# Patient Record
Sex: Female | Born: 1935 | Race: White | Hispanic: No | Smoking: Never smoker
Health system: Southern US, Community
[De-identification: ages and names within clinical notes are randomized; demographics above are authoritative.]

## PROBLEM LIST (undated history)

## (undated) DIAGNOSIS — I10 Essential (primary) hypertension: Secondary | ICD-10-CM

## (undated) DIAGNOSIS — J439 Emphysema, unspecified: Secondary | ICD-10-CM

## (undated) DIAGNOSIS — G629 Polyneuropathy, unspecified: Secondary | ICD-10-CM

## (undated) DIAGNOSIS — I739 Peripheral vascular disease, unspecified: Secondary | ICD-10-CM

## (undated) DIAGNOSIS — K219 Gastro-esophageal reflux disease without esophagitis: Secondary | ICD-10-CM

## (undated) DIAGNOSIS — E785 Hyperlipidemia, unspecified: Secondary | ICD-10-CM

## (undated) DIAGNOSIS — M810 Age-related osteoporosis without current pathological fracture: Secondary | ICD-10-CM

## (undated) DIAGNOSIS — I509 Heart failure, unspecified: Secondary | ICD-10-CM

## (undated) DIAGNOSIS — I2510 Atherosclerotic heart disease of native coronary artery without angina pectoris: Secondary | ICD-10-CM

## (undated) DIAGNOSIS — I639 Cerebral infarction, unspecified: Secondary | ICD-10-CM

## (undated) DIAGNOSIS — D649 Anemia, unspecified: Secondary | ICD-10-CM

## (undated) DIAGNOSIS — N289 Disorder of kidney and ureter, unspecified: Secondary | ICD-10-CM

## (undated) HISTORY — DX: Heart failure, unspecified: I50.9

## (undated) HISTORY — DX: Hyperlipidemia, unspecified: E78.5

## (undated) HISTORY — DX: Anemia, unspecified: D64.9

## (undated) HISTORY — PX: CATARACT EXTRACTION: SUR2

## (undated) HISTORY — DX: Polyneuropathy, unspecified: G62.9

## (undated) HISTORY — DX: Age-related osteoporosis without current pathological fracture: M81.0

## (undated) HISTORY — DX: Emphysema, unspecified: J43.9

---

## 1980-10-19 HISTORY — PX: ABDOMINAL HYSTERECTOMY: SHX81

## 1990-10-19 HISTORY — PX: CHOLECYSTECTOMY: SHX55

## 2003-10-20 HISTORY — PX: JOINT REPLACEMENT: SHX530

## 2003-11-29 ENCOUNTER — Other Ambulatory Visit: Payer: Self-pay

## 2004-07-19 ENCOUNTER — Ambulatory Visit: Payer: Self-pay

## 2004-08-19 ENCOUNTER — Ambulatory Visit: Payer: Self-pay

## 2004-09-18 ENCOUNTER — Ambulatory Visit: Payer: Self-pay | Admitting: Internal Medicine

## 2004-10-19 ENCOUNTER — Ambulatory Visit: Payer: Self-pay | Admitting: Internal Medicine

## 2004-11-19 ENCOUNTER — Ambulatory Visit: Payer: Self-pay | Admitting: Internal Medicine

## 2004-12-17 ENCOUNTER — Ambulatory Visit: Payer: Self-pay | Admitting: Internal Medicine

## 2005-01-17 ENCOUNTER — Ambulatory Visit: Payer: Self-pay | Admitting: Internal Medicine

## 2005-02-16 ENCOUNTER — Ambulatory Visit: Payer: Self-pay | Admitting: Internal Medicine

## 2005-03-19 ENCOUNTER — Ambulatory Visit: Payer: Self-pay | Admitting: Internal Medicine

## 2005-04-18 ENCOUNTER — Ambulatory Visit: Payer: Self-pay | Admitting: Internal Medicine

## 2005-05-19 ENCOUNTER — Ambulatory Visit: Payer: Self-pay | Admitting: Internal Medicine

## 2005-06-19 ENCOUNTER — Ambulatory Visit: Payer: Self-pay | Admitting: Internal Medicine

## 2005-08-07 ENCOUNTER — Ambulatory Visit: Payer: Self-pay | Admitting: Internal Medicine

## 2005-08-14 ENCOUNTER — Ambulatory Visit: Payer: Self-pay | Admitting: Internal Medicine

## 2005-08-19 ENCOUNTER — Ambulatory Visit: Payer: Self-pay | Admitting: Internal Medicine

## 2005-09-18 ENCOUNTER — Ambulatory Visit: Payer: Self-pay | Admitting: Internal Medicine

## 2005-10-19 ENCOUNTER — Ambulatory Visit: Payer: Self-pay | Admitting: Internal Medicine

## 2005-11-19 ENCOUNTER — Ambulatory Visit: Payer: Self-pay | Admitting: Internal Medicine

## 2005-12-25 ENCOUNTER — Ambulatory Visit: Payer: Self-pay | Admitting: Internal Medicine

## 2006-01-01 ENCOUNTER — Ambulatory Visit: Payer: Self-pay | Admitting: Internal Medicine

## 2006-01-17 ENCOUNTER — Ambulatory Visit: Payer: Self-pay | Admitting: Internal Medicine

## 2006-02-16 ENCOUNTER — Ambulatory Visit: Payer: Self-pay | Admitting: Internal Medicine

## 2006-03-19 ENCOUNTER — Ambulatory Visit: Payer: Self-pay | Admitting: Internal Medicine

## 2006-04-18 ENCOUNTER — Ambulatory Visit: Payer: Self-pay | Admitting: Internal Medicine

## 2006-05-19 ENCOUNTER — Ambulatory Visit: Payer: Self-pay | Admitting: Internal Medicine

## 2006-06-19 ENCOUNTER — Ambulatory Visit: Payer: Self-pay | Admitting: Internal Medicine

## 2006-10-08 ENCOUNTER — Emergency Department: Payer: Self-pay | Admitting: Emergency Medicine

## 2006-12-06 ENCOUNTER — Inpatient Hospital Stay: Payer: Self-pay | Admitting: Orthopaedic Surgery

## 2006-12-06 ENCOUNTER — Other Ambulatory Visit: Payer: Self-pay

## 2006-12-11 ENCOUNTER — Encounter: Payer: Self-pay | Admitting: Internal Medicine

## 2006-12-18 ENCOUNTER — Encounter: Payer: Self-pay | Admitting: Internal Medicine

## 2008-07-24 ENCOUNTER — Other Ambulatory Visit: Payer: Self-pay

## 2008-07-24 ENCOUNTER — Inpatient Hospital Stay: Payer: Self-pay | Admitting: Internal Medicine

## 2008-08-12 ENCOUNTER — Inpatient Hospital Stay: Payer: Self-pay | Admitting: Internal Medicine

## 2008-08-23 ENCOUNTER — Encounter: Payer: Self-pay | Admitting: Internal Medicine

## 2008-09-16 ENCOUNTER — Inpatient Hospital Stay: Payer: Self-pay | Admitting: Internal Medicine

## 2009-11-07 ENCOUNTER — Inpatient Hospital Stay: Payer: Self-pay | Admitting: Internal Medicine

## 2009-11-26 ENCOUNTER — Ambulatory Visit: Payer: Self-pay | Admitting: Internal Medicine

## 2009-12-03 ENCOUNTER — Encounter: Payer: Self-pay | Admitting: Internal Medicine

## 2010-09-18 ENCOUNTER — Ambulatory Visit: Payer: Self-pay | Admitting: Internal Medicine

## 2010-09-29 ENCOUNTER — Ambulatory Visit: Payer: Self-pay | Admitting: Ophthalmology

## 2010-11-26 ENCOUNTER — Ambulatory Visit: Payer: Self-pay | Admitting: Ophthalmology

## 2010-12-08 ENCOUNTER — Ambulatory Visit: Payer: Self-pay | Admitting: Ophthalmology

## 2011-01-27 ENCOUNTER — Inpatient Hospital Stay: Payer: Self-pay | Admitting: Internal Medicine

## 2011-10-08 ENCOUNTER — Ambulatory Visit: Payer: Self-pay | Admitting: Internal Medicine

## 2012-01-06 DIAGNOSIS — L905 Scar conditions and fibrosis of skin: Secondary | ICD-10-CM | POA: Diagnosis not present

## 2012-01-06 DIAGNOSIS — C44319 Basal cell carcinoma of skin of other parts of face: Secondary | ICD-10-CM | POA: Diagnosis not present

## 2012-01-06 DIAGNOSIS — D485 Neoplasm of uncertain behavior of skin: Secondary | ICD-10-CM | POA: Diagnosis not present

## 2012-02-05 DIAGNOSIS — M79609 Pain in unspecified limb: Secondary | ICD-10-CM | POA: Diagnosis not present

## 2012-02-05 DIAGNOSIS — M722 Plantar fascial fibromatosis: Secondary | ICD-10-CM | POA: Diagnosis not present

## 2012-02-05 DIAGNOSIS — B351 Tinea unguium: Secondary | ICD-10-CM | POA: Diagnosis not present

## 2012-04-29 DIAGNOSIS — L0291 Cutaneous abscess, unspecified: Secondary | ICD-10-CM | POA: Diagnosis not present

## 2012-04-29 DIAGNOSIS — R413 Other amnesia: Secondary | ICD-10-CM | POA: Diagnosis not present

## 2012-04-29 DIAGNOSIS — I10 Essential (primary) hypertension: Secondary | ICD-10-CM | POA: Diagnosis not present

## 2012-04-29 DIAGNOSIS — I8390 Asymptomatic varicose veins of unspecified lower extremity: Secondary | ICD-10-CM | POA: Diagnosis not present

## 2012-04-29 DIAGNOSIS — M539 Dorsopathy, unspecified: Secondary | ICD-10-CM | POA: Diagnosis not present

## 2012-04-29 DIAGNOSIS — L0390 Cellulitis, unspecified: Secondary | ICD-10-CM | POA: Diagnosis not present

## 2012-05-03 DIAGNOSIS — I8390 Asymptomatic varicose veins of unspecified lower extremity: Secondary | ICD-10-CM | POA: Diagnosis not present

## 2012-05-03 DIAGNOSIS — R413 Other amnesia: Secondary | ICD-10-CM | POA: Diagnosis not present

## 2012-05-03 DIAGNOSIS — I10 Essential (primary) hypertension: Secondary | ICD-10-CM | POA: Diagnosis not present

## 2012-05-03 DIAGNOSIS — M539 Dorsopathy, unspecified: Secondary | ICD-10-CM | POA: Diagnosis not present

## 2012-05-03 DIAGNOSIS — L0390 Cellulitis, unspecified: Secondary | ICD-10-CM | POA: Diagnosis not present

## 2012-05-03 DIAGNOSIS — L0291 Cutaneous abscess, unspecified: Secondary | ICD-10-CM | POA: Diagnosis not present

## 2012-06-13 DIAGNOSIS — R5381 Other malaise: Secondary | ICD-10-CM | POA: Diagnosis not present

## 2012-06-13 DIAGNOSIS — I739 Peripheral vascular disease, unspecified: Secondary | ICD-10-CM | POA: Diagnosis not present

## 2012-06-13 DIAGNOSIS — R413 Other amnesia: Secondary | ICD-10-CM | POA: Diagnosis not present

## 2012-06-13 DIAGNOSIS — R5383 Other fatigue: Secondary | ICD-10-CM | POA: Diagnosis not present

## 2012-07-06 DIAGNOSIS — R209 Unspecified disturbances of skin sensation: Secondary | ICD-10-CM | POA: Diagnosis not present

## 2012-07-06 DIAGNOSIS — I8310 Varicose veins of unspecified lower extremity with inflammation: Secondary | ICD-10-CM | POA: Diagnosis not present

## 2012-07-12 DIAGNOSIS — I872 Venous insufficiency (chronic) (peripheral): Secondary | ICD-10-CM | POA: Diagnosis not present

## 2012-07-13 ENCOUNTER — Emergency Department: Payer: Self-pay | Admitting: Emergency Medicine

## 2012-07-13 DIAGNOSIS — R059 Cough, unspecified: Secondary | ICD-10-CM | POA: Diagnosis not present

## 2012-07-13 DIAGNOSIS — J069 Acute upper respiratory infection, unspecified: Secondary | ICD-10-CM | POA: Diagnosis not present

## 2012-07-13 DIAGNOSIS — R0602 Shortness of breath: Secondary | ICD-10-CM | POA: Diagnosis not present

## 2012-07-13 LAB — URINALYSIS, COMPLETE
Bilirubin,UR: NEGATIVE
Blood: NEGATIVE
Glucose,UR: NEGATIVE mg/dL (ref 0–75)
Ketone: NEGATIVE
Nitrite: NEGATIVE
Ph: 7 (ref 4.5–8.0)
RBC,UR: <1 /HPF (ref 0–5)
Specific Gravity: 1.008 (ref 1.003–1.030)
WBC UR: NONE SEEN /HPF (ref 0–5)

## 2012-07-13 LAB — BASIC METABOLIC PANEL
Anion Gap: 10 (ref 7–16)
BUN: 36 mg/dL — ABNORMAL HIGH (ref 7–18)
Creatinine: 1.8 mg/dL — ABNORMAL HIGH (ref 0.60–1.30)
EGFR (African American): 29 — ABNORMAL LOW
EGFR (Non-African Amer.): 25 — ABNORMAL LOW
Glucose: 124 mg/dL — ABNORMAL HIGH (ref 65–99)
Osmolality: 287 (ref 275–301)
Potassium: 4.4 mmol/L (ref 3.5–5.1)

## 2012-07-13 LAB — CBC
HGB: 12.4 g/dL (ref 12.0–16.0)
MCH: 32.9 pg (ref 26.0–34.0)
MCHC: 33.3 g/dL (ref 32.0–36.0)
MCV: 99 fL (ref 80–100)
Platelet: 115 10*3/uL — ABNORMAL LOW (ref 150–440)
RDW: 13.3 % (ref 11.5–14.5)

## 2012-08-01 DIAGNOSIS — I872 Venous insufficiency (chronic) (peripheral): Secondary | ICD-10-CM | POA: Diagnosis not present

## 2012-08-04 ENCOUNTER — Emergency Department: Payer: Self-pay | Admitting: Emergency Medicine

## 2012-08-04 DIAGNOSIS — R6889 Other general symptoms and signs: Secondary | ICD-10-CM | POA: Diagnosis not present

## 2012-08-04 DIAGNOSIS — R079 Chest pain, unspecified: Secondary | ICD-10-CM | POA: Diagnosis not present

## 2012-08-04 DIAGNOSIS — I10 Essential (primary) hypertension: Secondary | ICD-10-CM | POA: Diagnosis not present

## 2012-08-04 DIAGNOSIS — I6350 Cerebral infarction due to unspecified occlusion or stenosis of unspecified cerebral artery: Secondary | ICD-10-CM | POA: Diagnosis not present

## 2012-08-04 DIAGNOSIS — S0003XA Contusion of scalp, initial encounter: Secondary | ICD-10-CM | POA: Diagnosis not present

## 2012-08-04 DIAGNOSIS — M25559 Pain in unspecified hip: Secondary | ICD-10-CM | POA: Diagnosis not present

## 2012-09-06 DIAGNOSIS — I872 Venous insufficiency (chronic) (peripheral): Secondary | ICD-10-CM | POA: Diagnosis not present

## 2012-10-17 DIAGNOSIS — L570 Actinic keratosis: Secondary | ICD-10-CM | POA: Diagnosis not present

## 2012-10-17 DIAGNOSIS — I8310 Varicose veins of unspecified lower extremity with inflammation: Secondary | ICD-10-CM | POA: Diagnosis not present

## 2013-01-27 DIAGNOSIS — M79609 Pain in unspecified limb: Secondary | ICD-10-CM | POA: Diagnosis not present

## 2013-01-27 DIAGNOSIS — B351 Tinea unguium: Secondary | ICD-10-CM | POA: Diagnosis not present

## 2013-02-23 ENCOUNTER — Emergency Department: Payer: Self-pay | Admitting: Emergency Medicine

## 2013-02-23 DIAGNOSIS — N390 Urinary tract infection, site not specified: Secondary | ICD-10-CM | POA: Diagnosis not present

## 2013-02-23 DIAGNOSIS — I4949 Other premature depolarization: Secondary | ICD-10-CM | POA: Diagnosis not present

## 2013-02-23 DIAGNOSIS — R002 Palpitations: Secondary | ICD-10-CM | POA: Diagnosis not present

## 2013-02-23 DIAGNOSIS — R6889 Other general symptoms and signs: Secondary | ICD-10-CM | POA: Diagnosis not present

## 2013-02-23 DIAGNOSIS — I491 Atrial premature depolarization: Secondary | ICD-10-CM | POA: Diagnosis not present

## 2013-02-23 DIAGNOSIS — R42 Dizziness and giddiness: Secondary | ICD-10-CM | POA: Diagnosis not present

## 2013-02-23 LAB — COMPREHENSIVE METABOLIC PANEL
Alkaline Phosphatase: 128 U/L (ref 50–136)
BUN: 34 mg/dL — ABNORMAL HIGH (ref 7–18)
Bilirubin,Total: 0.4 mg/dL (ref 0.2–1.0)
Calcium, Total: 9.9 mg/dL (ref 8.5–10.1)
Chloride: 105 mmol/L (ref 98–107)
Creatinine: 1.81 mg/dL — ABNORMAL HIGH (ref 0.60–1.30)
EGFR (African American): 28 — ABNORMAL LOW
EGFR (Non-African Amer.): 25 — ABNORMAL LOW
Osmolality: 288 (ref 275–301)
Potassium: 4.4 mmol/L (ref 3.5–5.1)
SGOT(AST): 23 U/L (ref 15–37)
SGPT (ALT): 21 U/L (ref 12–78)
Total Protein: 7.7 g/dL (ref 6.4–8.2)

## 2013-02-23 LAB — CBC
HCT: 38.7 % (ref 35.0–47.0)
MCH: 32.8 pg (ref 26.0–34.0)
MCV: 97 fL (ref 80–100)
RBC: 3.97 10*6/uL (ref 3.80–5.20)
WBC: 6.9 10*3/uL (ref 3.6–11.0)

## 2013-02-23 LAB — CK TOTAL AND CKMB (NOT AT ARMC): CK, Total: 34 U/L (ref 21–215)

## 2013-02-24 LAB — URINALYSIS, COMPLETE
Bacteria: NONE SEEN
Blood: NEGATIVE
Glucose,UR: NEGATIVE mg/dL (ref 0–75)
Ketone: NEGATIVE
Ph: 5 (ref 4.5–8.0)
Protein: NEGATIVE
RBC,UR: 2 /HPF (ref 0–5)
Specific Gravity: 1.009 (ref 1.003–1.030)
Squamous Epithelial: <1
WBC UR: 25 /HPF (ref 0–5)

## 2013-03-01 DIAGNOSIS — Z0189 Encounter for other specified special examinations: Secondary | ICD-10-CM | POA: Diagnosis not present

## 2013-03-01 DIAGNOSIS — D485 Neoplasm of uncertain behavior of skin: Secondary | ICD-10-CM | POA: Diagnosis not present

## 2013-03-01 DIAGNOSIS — Z85828 Personal history of other malignant neoplasm of skin: Secondary | ICD-10-CM | POA: Diagnosis not present

## 2013-03-01 DIAGNOSIS — I8310 Varicose veins of unspecified lower extremity with inflammation: Secondary | ICD-10-CM | POA: Diagnosis not present

## 2013-03-01 DIAGNOSIS — L570 Actinic keratosis: Secondary | ICD-10-CM | POA: Diagnosis not present

## 2013-03-01 DIAGNOSIS — C44621 Squamous cell carcinoma of skin of unspecified upper limb, including shoulder: Secondary | ICD-10-CM | POA: Diagnosis not present

## 2013-03-29 DIAGNOSIS — D2360 Other benign neoplasm of skin of unspecified upper limb, including shoulder: Secondary | ICD-10-CM | POA: Diagnosis not present

## 2013-03-29 DIAGNOSIS — L570 Actinic keratosis: Secondary | ICD-10-CM | POA: Diagnosis not present

## 2013-05-01 DIAGNOSIS — I10 Essential (primary) hypertension: Secondary | ICD-10-CM | POA: Diagnosis not present

## 2013-05-01 DIAGNOSIS — I509 Heart failure, unspecified: Secondary | ICD-10-CM | POA: Diagnosis not present

## 2013-05-01 DIAGNOSIS — M109 Gout, unspecified: Secondary | ICD-10-CM | POA: Diagnosis not present

## 2013-05-01 DIAGNOSIS — I8390 Asymptomatic varicose veins of unspecified lower extremity: Secondary | ICD-10-CM | POA: Diagnosis not present

## 2013-05-16 DIAGNOSIS — E7800 Pure hypercholesterolemia, unspecified: Secondary | ICD-10-CM | POA: Diagnosis not present

## 2013-05-16 DIAGNOSIS — E039 Hypothyroidism, unspecified: Secondary | ICD-10-CM | POA: Diagnosis not present

## 2013-05-16 DIAGNOSIS — D649 Anemia, unspecified: Secondary | ICD-10-CM | POA: Diagnosis not present

## 2013-06-13 DIAGNOSIS — I8390 Asymptomatic varicose veins of unspecified lower extremity: Secondary | ICD-10-CM | POA: Diagnosis not present

## 2013-06-13 DIAGNOSIS — M109 Gout, unspecified: Secondary | ICD-10-CM | POA: Diagnosis not present

## 2013-07-12 DIAGNOSIS — H43819 Vitreous degeneration, unspecified eye: Secondary | ICD-10-CM | POA: Diagnosis not present

## 2013-09-04 DIAGNOSIS — IMO0002 Reserved for concepts with insufficient information to code with codable children: Secondary | ICD-10-CM | POA: Diagnosis not present

## 2013-09-04 DIAGNOSIS — I739 Peripheral vascular disease, unspecified: Secondary | ICD-10-CM | POA: Diagnosis not present

## 2013-09-04 DIAGNOSIS — A5211 Tabes dorsalis: Secondary | ICD-10-CM | POA: Diagnosis not present

## 2013-09-05 DIAGNOSIS — R609 Edema, unspecified: Secondary | ICD-10-CM | POA: Diagnosis not present

## 2013-09-05 DIAGNOSIS — I509 Heart failure, unspecified: Secondary | ICD-10-CM | POA: Diagnosis not present

## 2013-09-05 DIAGNOSIS — I10 Essential (primary) hypertension: Secondary | ICD-10-CM | POA: Diagnosis not present

## 2013-09-05 DIAGNOSIS — R5381 Other malaise: Secondary | ICD-10-CM | POA: Diagnosis not present

## 2013-09-12 ENCOUNTER — Encounter: Payer: Self-pay | Admitting: *Deleted

## 2013-09-21 DIAGNOSIS — I739 Peripheral vascular disease, unspecified: Secondary | ICD-10-CM | POA: Diagnosis not present

## 2013-09-21 DIAGNOSIS — I509 Heart failure, unspecified: Secondary | ICD-10-CM | POA: Diagnosis not present

## 2013-09-21 DIAGNOSIS — E539 Vitamin B deficiency, unspecified: Secondary | ICD-10-CM | POA: Diagnosis not present

## 2013-09-21 DIAGNOSIS — A5211 Tabes dorsalis: Secondary | ICD-10-CM | POA: Diagnosis not present

## 2013-10-03 ENCOUNTER — Ambulatory Visit (INDEPENDENT_AMBULATORY_CARE_PROVIDER_SITE_OTHER): Payer: Medicare Other | Admitting: General Surgery

## 2013-10-03 ENCOUNTER — Encounter: Payer: Self-pay | Admitting: General Surgery

## 2013-10-03 VITALS — BP 148/70 | HR 72 | Resp 14 | Ht 66.0 in | Wt 162.0 lb

## 2013-10-03 DIAGNOSIS — I872 Venous insufficiency (chronic) (peripheral): Secondary | ICD-10-CM | POA: Diagnosis not present

## 2013-10-03 DIAGNOSIS — I739 Peripheral vascular disease, unspecified: Secondary | ICD-10-CM

## 2013-10-03 NOTE — Patient Instructions (Signed)
The patient is aware to call back for any questions or concerns.  

## 2013-10-03 NOTE — Progress Notes (Signed)
Patient ID: [name] [name], female   DOB: 29-Mar-1923, 79 y.o.   MRN: [id number]  Chief Complaint  Patient presents with  . Obesity    poor circulation in both legs    HPI [name] [name] is a 79 y.o. female here today for a evaluation on both legs. Patient states she has poor circulation.She states she has been having this problem for many years. Daughter states that her mother complains of feet pain that seems to be getting worse. Pain in toes and heels especially at night. Dr Juel Burrow did increase her gabapentin. This has lessened her foot pain. She has been wearing compression wraps for about one year. She has known venous insufficiency with mild peripheral artery disease. Only change is increased foot pain when lying in bed and discoloration. This involves both feet, left is worse. She has done well with use of circaid compression device.Marland Kitchen    HPI  Past Medical History  Diagnosis Date  . Hyperlipidemia   . CHF (congestive heart failure)   . Emphysema of lung   . Neuropathy   . Anemia   . Osteoporosis     Past Surgical History  Procedure Laterality Date  . Cholecystectomy  1992  . Abdominal hysterectomy  1982  . Joint replacement  2005    Family History  Problem Relation Age of Onset  . Colon cancer Sister     Social History History  Substance Use Topics  . Smoking status: Never Smoker   . Smokeless tobacco: Never Used  . Alcohol Use: No    Allergies  Allergen Reactions  . Morphine And Related     Heart problems   . Zoloft [Sertraline Hcl] Swelling    Current Outpatient Prescriptions  Medication Sig Dispense Refill  . ALPRAZolam (XANAX) 0.25 MG tablet Take 1 tablet by mouth daily.      Marland Kitchen aspirin 81 MG tablet Take 81 mg by mouth daily.      . calcium carbonate 200 MG capsule Take 250 mg by mouth 2 (two) times daily with a meal.      . cholecalciferol (VITAMIN D) 400 UNITS TABS tablet Take 400 Units by mouth.      . clopidogrel (PLAVIX) 75 MG tablet Take 1 tablet by  mouth daily.      . cyanocobalamin 1000 MCG tablet Take 100 mcg by mouth daily.      Marland Kitchen FLECTOR 1.3 % PTCH 1 patch daily.      . furosemide (LASIX) 20 MG tablet Take 1 tablet by mouth daily.      Marland Kitchen gabapentin (NEURONTIN) 300 MG capsule Take 1 capsule by mouth daily.      Marland Kitchen loratadine (CLARITIN) 10 MG tablet Take 10 mg by mouth daily.      . magnesium 30 MG tablet Take 30 mg by mouth 2 (two) times daily.      . metoprolol tartrate (LOPRESSOR) 25 MG tablet Take 1 tablet by mouth daily.      . Multiple Vitamins-Minerals (MULTIVITAMIN WITH MINERALS) tablet Take 1 tablet by mouth daily.      . nitroGLYCERIN (NITRODUR - DOSED IN MG/24 HR) 0.2 mg/hr patch 1 patch daily.      . potassium chloride SA (K-DUR,KLOR-CON) 20 MEQ tablet Take 1 tablet by mouth daily.      . ranitidine (ZANTAC) 150 MG capsule Take 150 mg by mouth 2 (two) times daily.      Marland Kitchen triamcinolone (KENALOG) 0.025 % cream 1 application daily.      Marland Kitchen  iron polysaccharides (NIFEREX) 150 MG capsule Take 150 mg by mouth daily.       No current facility-administered medications for this visit.    Review of Systems Review of Systems  Constitutional: Negative.   Respiratory: Negative.   Cardiovascular: Positive for leg swelling. Negative for chest pain and palpitations.    Blood pressure 148/70, pulse 72, resp. rate 14, height 5\' 6"  (1.676 m), weight 162 lb (73.483 kg).  Physical Exam Physical Exam  Constitutional: She is oriented to person, place, and time. She appears well-developed and well-nourished.  Eyes: Conjunctivae are normal. No scleral icterus.  Neck: Neck supple.  Cardiovascular:  Pulses:      Dorsalis pedis pulses are 1+ on the right side, and 0 on the left side.       Posterior tibial pulses are 0 on the right side, and 0 on the left side.  No palpable pulse in left foot. Bilateral minimal edema. Both feet have brisk capillary refill.  Neurological: She is alert and oriented to person, place, and time.  Skin: Skin is  warm and dry.    Data Reviewed none  Assessment    Venous insufficiency is stable. She has moderate PAD on left but does not account for her pain in both feet.     Plan    Consider refer to vascular for further arterial evaluation if her foot pain worsens. At present her pain is better with increasing her gabapentin dosage. Will discuss with Dr. Juel Burrow.        SANKAR,SEEPLAPUTHUR G 10/05/2013, 9:51 AM

## 2013-10-05 ENCOUNTER — Encounter: Payer: Self-pay | Admitting: General Surgery

## 2013-10-05 DIAGNOSIS — I739 Peripheral vascular disease, unspecified: Secondary | ICD-10-CM | POA: Insufficient documentation

## 2013-10-05 DIAGNOSIS — I872 Venous insufficiency (chronic) (peripheral): Secondary | ICD-10-CM | POA: Insufficient documentation

## 2014-01-15 DIAGNOSIS — I739 Peripheral vascular disease, unspecified: Secondary | ICD-10-CM | POA: Diagnosis not present

## 2014-01-15 DIAGNOSIS — E539 Vitamin B deficiency, unspecified: Secondary | ICD-10-CM | POA: Diagnosis not present

## 2014-01-15 DIAGNOSIS — I509 Heart failure, unspecified: Secondary | ICD-10-CM | POA: Diagnosis not present

## 2014-01-15 DIAGNOSIS — I10 Essential (primary) hypertension: Secondary | ICD-10-CM | POA: Diagnosis not present

## 2014-01-15 DIAGNOSIS — A5211 Tabes dorsalis: Secondary | ICD-10-CM | POA: Diagnosis not present

## 2014-01-26 DIAGNOSIS — I70229 Atherosclerosis of native arteries of extremities with rest pain, unspecified extremity: Secondary | ICD-10-CM | POA: Diagnosis not present

## 2014-01-26 DIAGNOSIS — I10 Essential (primary) hypertension: Secondary | ICD-10-CM | POA: Diagnosis not present

## 2014-01-26 DIAGNOSIS — E785 Hyperlipidemia, unspecified: Secondary | ICD-10-CM | POA: Diagnosis not present

## 2014-06-07 ENCOUNTER — Emergency Department: Payer: Self-pay | Admitting: Emergency Medicine

## 2014-06-07 DIAGNOSIS — R609 Edema, unspecified: Secondary | ICD-10-CM | POA: Diagnosis not present

## 2014-06-07 DIAGNOSIS — J984 Other disorders of lung: Secondary | ICD-10-CM | POA: Diagnosis not present

## 2014-06-07 DIAGNOSIS — J441 Chronic obstructive pulmonary disease with (acute) exacerbation: Secondary | ICD-10-CM | POA: Diagnosis not present

## 2014-06-07 DIAGNOSIS — R0602 Shortness of breath: Secondary | ICD-10-CM | POA: Diagnosis not present

## 2014-06-07 DIAGNOSIS — I10 Essential (primary) hypertension: Secondary | ICD-10-CM | POA: Diagnosis not present

## 2014-06-07 DIAGNOSIS — R059 Cough, unspecified: Secondary | ICD-10-CM | POA: Diagnosis not present

## 2014-06-07 DIAGNOSIS — M7989 Other specified soft tissue disorders: Secondary | ICD-10-CM | POA: Diagnosis not present

## 2014-06-07 DIAGNOSIS — I517 Cardiomegaly: Secondary | ICD-10-CM | POA: Diagnosis not present

## 2014-06-07 DIAGNOSIS — R05 Cough: Secondary | ICD-10-CM | POA: Diagnosis not present

## 2014-06-07 LAB — BASIC METABOLIC PANEL
Anion Gap: 8 (ref 7–16)
BUN: 38 mg/dL — ABNORMAL HIGH (ref 7–18)
CALCIUM: 9.8 mg/dL (ref 8.5–10.1)
CHLORIDE: 103 mmol/L (ref 98–107)
CO2: 28 mmol/L (ref 21–32)
Creatinine: 1.89 mg/dL — ABNORMAL HIGH (ref 0.60–1.30)
EGFR (African American): 27 — ABNORMAL LOW
GFR CALC NON AF AMER: 23 — AB
Glucose: 191 mg/dL — ABNORMAL HIGH (ref 65–99)
OSMOLALITY: 292 (ref 275–301)
POTASSIUM: 5 mmol/L (ref 3.5–5.1)
Sodium: 139 mmol/L (ref 136–145)

## 2014-06-07 LAB — CBC
HCT: 40.6 % (ref 35.0–47.0)
HGB: 13.6 g/dL (ref 12.0–16.0)
MCH: 33.2 pg (ref 26.0–34.0)
MCHC: 33.5 g/dL (ref 32.0–36.0)
MCV: 99 fL (ref 80–100)
Platelet: 141 10*3/uL — ABNORMAL LOW (ref 150–440)
RBC: 4.1 10*6/uL (ref 3.80–5.20)
RDW: 13.2 % (ref 11.5–14.5)
WBC: 13 10*3/uL — ABNORMAL HIGH (ref 3.6–11.0)

## 2014-06-15 ENCOUNTER — Encounter: Payer: Self-pay | Admitting: Podiatry

## 2014-06-15 ENCOUNTER — Ambulatory Visit (INDEPENDENT_AMBULATORY_CARE_PROVIDER_SITE_OTHER): Payer: Medicare Other | Admitting: Podiatry

## 2014-06-15 VITALS — Ht 66.0 in | Wt 158.0 lb

## 2014-06-15 DIAGNOSIS — M79673 Pain in unspecified foot: Secondary | ICD-10-CM

## 2014-06-15 DIAGNOSIS — R5383 Other fatigue: Secondary | ICD-10-CM | POA: Diagnosis not present

## 2014-06-15 DIAGNOSIS — R413 Other amnesia: Secondary | ICD-10-CM | POA: Diagnosis not present

## 2014-06-15 DIAGNOSIS — J159 Unspecified bacterial pneumonia: Secondary | ICD-10-CM | POA: Diagnosis not present

## 2014-06-15 DIAGNOSIS — M79609 Pain in unspecified limb: Secondary | ICD-10-CM

## 2014-06-15 DIAGNOSIS — B351 Tinea unguium: Secondary | ICD-10-CM

## 2014-06-15 DIAGNOSIS — R5381 Other malaise: Secondary | ICD-10-CM | POA: Diagnosis not present

## 2014-06-15 DIAGNOSIS — I739 Peripheral vascular disease, unspecified: Secondary | ICD-10-CM | POA: Diagnosis not present

## 2014-06-15 NOTE — Progress Notes (Signed)
Subjective:     Patient ID: [name] [name], female   DOB: 01-04-1923, 80 y.o.   MRN: [id number]  HPI patient presents with caregiver with yellow thick nailbeds 1-5 both feet the become painful and small little irritations of the fourth and fifth toes on both feet that they keep under control with Neosporin stating that they are seen the vascular doctor at this time   Review of Systems     Objective:   Physical Exam No change in neurovascular status with thick yellow brittle nailbeds 1-5 both feet that are painful and small lesions on both toes which are nontender when pressed    Assessment:     Mycotic nail infection with pain and patient with poor circulatory status    Plan:     Continue with Dr. for circulation and debrided nailbeds 1-5 both feet with no iatrogenic bleeding noted

## 2014-07-03 DIAGNOSIS — D649 Anemia, unspecified: Secondary | ICD-10-CM | POA: Diagnosis not present

## 2014-07-03 DIAGNOSIS — J189 Pneumonia, unspecified organism: Secondary | ICD-10-CM | POA: Diagnosis not present

## 2014-08-20 ENCOUNTER — Encounter: Payer: Self-pay | Admitting: Podiatry

## 2014-09-07 ENCOUNTER — Other Ambulatory Visit: Payer: Medicare Other

## 2014-12-10 DIAGNOSIS — R413 Other amnesia: Secondary | ICD-10-CM | POA: Diagnosis not present

## 2014-12-10 DIAGNOSIS — I10 Essential (primary) hypertension: Secondary | ICD-10-CM | POA: Diagnosis not present

## 2014-12-10 DIAGNOSIS — I8390 Asymptomatic varicose veins of unspecified lower extremity: Secondary | ICD-10-CM | POA: Diagnosis not present

## 2014-12-10 DIAGNOSIS — I509 Heart failure, unspecified: Secondary | ICD-10-CM | POA: Diagnosis not present

## 2015-03-11 DIAGNOSIS — I509 Heart failure, unspecified: Secondary | ICD-10-CM | POA: Diagnosis not present

## 2015-03-13 DIAGNOSIS — I10 Essential (primary) hypertension: Secondary | ICD-10-CM | POA: Diagnosis not present

## 2015-03-13 DIAGNOSIS — E784 Other hyperlipidemia: Secondary | ICD-10-CM | POA: Diagnosis not present

## 2015-03-25 DIAGNOSIS — K210 Gastro-esophageal reflux disease with esophagitis: Secondary | ICD-10-CM | POA: Diagnosis not present

## 2015-03-25 DIAGNOSIS — I10 Essential (primary) hypertension: Secondary | ICD-10-CM | POA: Diagnosis not present

## 2015-03-25 DIAGNOSIS — J690 Pneumonitis due to inhalation of food and vomit: Secondary | ICD-10-CM | POA: Diagnosis not present

## 2015-03-25 DIAGNOSIS — I739 Peripheral vascular disease, unspecified: Secondary | ICD-10-CM | POA: Diagnosis not present

## 2015-04-01 DIAGNOSIS — I8390 Asymptomatic varicose veins of unspecified lower extremity: Secondary | ICD-10-CM | POA: Diagnosis not present

## 2015-04-01 DIAGNOSIS — J180 Bronchopneumonia, unspecified organism: Secondary | ICD-10-CM | POA: Diagnosis not present

## 2015-04-01 DIAGNOSIS — I509 Heart failure, unspecified: Secondary | ICD-10-CM | POA: Diagnosis not present

## 2015-04-01 DIAGNOSIS — R05 Cough: Secondary | ICD-10-CM | POA: Diagnosis not present

## 2015-05-02 DIAGNOSIS — I110 Hypertensive heart disease with heart failure: Secondary | ICD-10-CM | POA: Diagnosis not present

## 2015-05-02 DIAGNOSIS — M109 Gout, unspecified: Secondary | ICD-10-CM | POA: Diagnosis not present

## 2015-05-02 DIAGNOSIS — M1460 Charcot's joint, unspecified site: Secondary | ICD-10-CM | POA: Diagnosis not present

## 2015-05-02 DIAGNOSIS — K210 Gastro-esophageal reflux disease with esophagitis: Secondary | ICD-10-CM | POA: Diagnosis not present

## 2015-07-11 DIAGNOSIS — F411 Generalized anxiety disorder: Secondary | ICD-10-CM | POA: Diagnosis not present

## 2015-07-11 DIAGNOSIS — I110 Hypertensive heart disease with heart failure: Secondary | ICD-10-CM | POA: Diagnosis not present

## 2015-07-11 DIAGNOSIS — M1460 Charcot's joint, unspecified site: Secondary | ICD-10-CM | POA: Diagnosis not present

## 2015-07-11 DIAGNOSIS — M109 Gout, unspecified: Secondary | ICD-10-CM | POA: Diagnosis not present

## 2015-07-15 ENCOUNTER — Telehealth: Payer: Self-pay | Admitting: Podiatry

## 2015-07-15 NOTE — Telephone Encounter (Signed)
Left voice mail to schedule appt

## 2015-09-17 DIAGNOSIS — M76891 Other specified enthesopathies of right lower limb, excluding foot: Secondary | ICD-10-CM | POA: Diagnosis not present

## 2015-09-17 DIAGNOSIS — M2550 Pain in unspecified joint: Secondary | ICD-10-CM | POA: Diagnosis not present

## 2015-09-17 DIAGNOSIS — M1460 Charcot's joint, unspecified site: Secondary | ICD-10-CM | POA: Diagnosis not present

## 2015-09-17 DIAGNOSIS — M109 Gout, unspecified: Secondary | ICD-10-CM | POA: Diagnosis not present

## 2015-11-25 DIAGNOSIS — I130 Hypertensive heart and chronic kidney disease with heart failure and stage 1 through stage 4 chronic kidney disease, or unspecified chronic kidney disease: Secondary | ICD-10-CM | POA: Diagnosis not present

## 2015-11-25 DIAGNOSIS — M109 Gout, unspecified: Secondary | ICD-10-CM | POA: Diagnosis not present

## 2015-11-25 DIAGNOSIS — I10 Essential (primary) hypertension: Secondary | ICD-10-CM | POA: Diagnosis not present

## 2015-11-25 DIAGNOSIS — D649 Anemia, unspecified: Secondary | ICD-10-CM | POA: Diagnosis not present

## 2015-11-25 DIAGNOSIS — J449 Chronic obstructive pulmonary disease, unspecified: Secondary | ICD-10-CM | POA: Diagnosis not present

## 2015-11-25 DIAGNOSIS — I110 Hypertensive heart disease with heart failure: Secondary | ICD-10-CM | POA: Diagnosis not present

## 2015-11-25 DIAGNOSIS — M1460 Charcot's joint, unspecified site: Secondary | ICD-10-CM | POA: Diagnosis not present

## 2015-11-25 DIAGNOSIS — K219 Gastro-esophageal reflux disease without esophagitis: Secondary | ICD-10-CM | POA: Diagnosis not present

## 2015-11-25 DIAGNOSIS — E785 Hyperlipidemia, unspecified: Secondary | ICD-10-CM | POA: Diagnosis not present

## 2015-11-28 ENCOUNTER — Emergency Department: Payer: Medicare Other

## 2015-11-28 ENCOUNTER — Emergency Department
Admission: EM | Admit: 2015-11-28 | Discharge: 2015-11-28 | Disposition: A | Discharge status: Home / Self Care | Payer: Medicare Other | Attending: Emergency Medicine | Admitting: Emergency Medicine

## 2015-11-28 ENCOUNTER — Encounter: Payer: Self-pay | Admitting: *Deleted

## 2015-11-28 DIAGNOSIS — Z7952 Long term (current) use of systemic steroids: Secondary | ICD-10-CM | POA: Diagnosis not present

## 2015-11-28 DIAGNOSIS — R1031 Right lower quadrant pain: Secondary | ICD-10-CM | POA: Insufficient documentation

## 2015-11-28 DIAGNOSIS — Z7902 Long term (current) use of antithrombotics/antiplatelets: Secondary | ICD-10-CM | POA: Insufficient documentation

## 2015-11-28 DIAGNOSIS — Z7982 Long term (current) use of aspirin: Secondary | ICD-10-CM | POA: Insufficient documentation

## 2015-11-28 DIAGNOSIS — Z79899 Other long term (current) drug therapy: Secondary | ICD-10-CM | POA: Insufficient documentation

## 2015-11-28 DIAGNOSIS — R112 Nausea with vomiting, unspecified: Secondary | ICD-10-CM | POA: Insufficient documentation

## 2015-11-28 DIAGNOSIS — R109 Unspecified abdominal pain: Secondary | ICD-10-CM

## 2015-11-28 DIAGNOSIS — R1010 Upper abdominal pain, unspecified: Secondary | ICD-10-CM | POA: Diagnosis not present

## 2015-11-28 LAB — COMPREHENSIVE METABOLIC PANEL
ALBUMIN: 4 g/dL (ref 3.5–5.0)
ALK PHOS: 96 U/L (ref 38–126)
ALT: 17 U/L (ref 14–54)
ANION GAP: 9 (ref 5–15)
AST: 24 U/L (ref 15–41)
BUN: 40 mg/dL — ABNORMAL HIGH (ref 6–20)
CALCIUM: 10.1 mg/dL (ref 8.9–10.3)
CO2: 28 mmol/L (ref 22–32)
Chloride: 101 mmol/L (ref 101–111)
Creatinine, Ser: 1.68 mg/dL — ABNORMAL HIGH (ref 0.44–1.00)
GFR calc Af Amer: 30 mL/min — ABNORMAL LOW (ref >60)
GFR calc non Af Amer: 26 mL/min — ABNORMAL LOW (ref >60)
GLUCOSE: 140 mg/dL — AB (ref 65–99)
Potassium: 5.2 mmol/L — ABNORMAL HIGH (ref 3.5–5.1)
SODIUM: 138 mmol/L (ref 135–145)
Total Bilirubin: 0.6 mg/dL (ref 0.3–1.2)
Total Protein: 7.1 g/dL (ref 6.5–8.1)

## 2015-11-28 LAB — CBC
HEMATOCRIT: 36.8 % (ref 35.0–47.0)
HEMOGLOBIN: 12.4 g/dL (ref 12.0–16.0)
MCH: 33.4 pg (ref 26.0–34.0)
MCHC: 33.7 g/dL (ref 32.0–36.0)
MCV: 99.1 fL (ref 80.0–100.0)
Platelets: 158 10*3/uL (ref 150–440)
RBC: 3.71 MIL/uL — ABNORMAL LOW (ref 3.80–5.20)
RDW: 13.5 % (ref 11.5–14.5)
WBC: 9.2 10*3/uL (ref 3.6–11.0)

## 2015-11-28 LAB — URINALYSIS COMPLETE WITH MICROSCOPIC (ARMC ONLY)
Bilirubin Urine: NEGATIVE
Glucose, UA: NEGATIVE mg/dL
Hgb urine dipstick: NEGATIVE
Leukocytes, UA: NEGATIVE
Nitrite: NEGATIVE
PROTEIN: NEGATIVE mg/dL
RBC / HPF: NONE SEEN RBC/hpf (ref 0–5)
Specific Gravity, Urine: 1.012 (ref 1.005–1.030)
pH: 7 (ref 5.0–8.0)

## 2015-11-28 LAB — LIPASE, BLOOD: Lipase: 27 U/L (ref 11–51)

## 2015-11-28 MED ORDER — GI COCKTAIL ~~LOC~~
30.0000 mL | Freq: Once | ORAL | Status: AC
  Administered 2015-11-28: 30 mL via ORAL
  Filled 2015-11-28: qty 30

## 2015-11-28 MED ORDER — IOHEXOL 240 MG/ML SOLN
25.0000 mL | Freq: Once | INTRAMUSCULAR | Status: AC | PRN
  Administered 2015-11-28: 25 mL via ORAL

## 2015-11-28 MED ORDER — DICYCLOMINE HCL 20 MG PO TABS: 20.0000 mg | ORAL_TABLET | Freq: Three times a day (TID) | ORAL | Status: AC | PRN

## 2015-11-28 MED ORDER — ONDANSETRON HCL 4 MG PO TABS: 4.0000 mg | ORAL_TABLET | Freq: Three times a day (TID) | ORAL | Status: AC | PRN

## 2015-11-28 MED ORDER — ONDANSETRON HCL 4 MG/2ML IJ SOLN
4.0000 mg | Freq: Once | INTRAMUSCULAR | Status: DC
  Filled 2015-11-28: qty 2

## 2015-11-28 MED ORDER — ONDANSETRON HCL 4 MG/2ML IJ SOLN
4.0000 mg | Freq: Once | INTRAMUSCULAR | Status: AC | PRN
  Administered 2015-11-28: 4 mg via INTRAVENOUS
  Filled 2015-11-28: qty 2

## 2015-11-28 MED ORDER — KETOROLAC TROMETHAMINE 30 MG/ML IJ SOLN
15.0000 mg | Freq: Once | INTRAMUSCULAR | Status: AC
  Administered 2015-11-28: 15 mg via INTRAVENOUS
  Filled 2015-11-28: qty 1

## 2015-11-28 NOTE — ED Notes (Signed)
Pt reports nausea has resolved. Pt currently refusing nausea medication. Pt agreed to call nurse if nausea comes back.

## 2015-11-28 NOTE — ED Notes (Signed)
States she woke up this AM with mild upper abd pain but states as the day has gone on increased upper abd pain and now vomiting

## 2015-11-28 NOTE — Discharge Instructions (Signed)
Abdominal Pain, Adult Many things can cause abdominal pain. Usually, abdominal pain is not caused by a disease and will improve without treatment. It can often be observed and treated at home. Your health care provider will do a physical exam and possibly order blood tests and X-rays to help determine the seriousness of your pain. However, in many cases, more time must pass before a clear cause of the pain can be found. Before that point, your health care provider may not know if you need more testing or further treatment. HOME CARE INSTRUCTIONS Monitor your abdominal pain for any changes. The following actions may help to alleviate any discomfort you are experiencing:  Only take over-the-counter or prescription medicines as directed by your health care provider.  Do not take laxatives unless directed to do so by your health care provider.  Try a clear liquid diet (broth, tea, or water) as directed by your health care provider. Slowly move to a bland diet as tolerated. SEEK MEDICAL CARE IF:  You have unexplained abdominal pain.  You have abdominal pain associated with nausea or diarrhea.  You have pain when you urinate or have a bowel movement.  You experience abdominal pain that wakes you in the night.  You have abdominal pain that is worsened or improved by eating food.  You have abdominal pain that is worsened with eating fatty foods.  You have a fever. SEEK IMMEDIATE MEDICAL CARE IF:  Your pain does not go away within 2 hours.  You keep throwing up (vomiting).  Your pain is felt only in portions of the abdomen, such as the right side or the left lower portion of the abdomen.  You pass bloody or black tarry stools. MAKE SURE YOU:  Understand these instructions.  Will watch your condition.  Will get help right away if you are not doing well or get worse.   This information is not intended to replace advice given to you by your health care provider. Make sure you discuss  any questions you have with your health care provider.   Document Released: 07/15/2005 Document Revised: 06/26/2015 Document Reviewed: 06/14/2013 Elsevier Interactive Patient Education [employer].  Please return immediately if condition worsens. Please contact her primary physician or the physician you were given for referral. If you have any specialist physicians involved in her treatment and plan please also contact them. Thank you for using [facility]. Please return especially for uncontrolled pain, bloody stool, fever, persistent vomiting, or any other new concerns.

## 2015-11-28 NOTE — ED Provider Notes (Signed)
Time Seen: Approximately ----------------------------------------- 6:11 PM on 11/28/2015 -----------------------------------------    I have reviewed the triage notes  Chief Complaint: Abdominal Pain and Emesis   History of Present Illness: [name] [name] is a 82 y.o. female who states that she woke up this morning with lower middle abdominal pain. She states she feels like she has a lot of gas and was unable to pass the gas. She's had 2 small bowel movements at home with some mild relief of the discomfort. She does not state that lateralizes to the left or the right lower abdominal area. She's had some occasional nausea and vomited 1 in the waiting room. She denies any feelings of nausea now after receiving IV Zofran. She denies any dysuria, hematuria, urinary frequency.   Past Medical History  Diagnosis Date  . Hyperlipidemia   . CHF (congestive heart failure) ([place])   . Emphysema of lung ([place])   . Neuropathy ([place])   . Anemia   . Osteoporosis     Patient Active Problem List   Diagnosis Date Noted  . PAD (peripheral artery disease) ([place]) 10/05/2013  . Chronic venous insufficiency 10/05/2013    Past Surgical History  Procedure Laterality Date  . Cholecystectomy  1992  . Abdominal hysterectomy  1982  . Joint replacement  2005   patient states she had the appendix out when they did her abdominal hysterectomy  Past Surgical History  Procedure Laterality Date  . Cholecystectomy  1992  . Abdominal hysterectomy  1982  . Joint replacement  2005    Current Outpatient Rx  Name  Route  Sig  Dispense  Refill  . ALPRAZolam (XANAX) 0.25 MG tablet   Oral   Take 1 tablet by mouth daily.         Marland Kitchen aspirin 81 MG tablet   Oral   Take 81 mg by mouth daily.         . calcium carbonate 200 MG capsule   Oral   Take 250 mg by mouth 2 (two) times daily with a meal.         . cholecalciferol (VITAMIN D) 400 UNITS TABS tablet   Oral   Take 400 Units by mouth.         .  clopidogrel (PLAVIX) 75 MG tablet   Oral   Take 1 tablet by mouth daily.         . cyanocobalamin 1000 MCG tablet   Oral   Take 100 mcg by mouth daily.         Marland Kitchen FLECTOR 1.3 % PTCH      1 patch daily.         . furosemide (LASIX) 20 MG tablet   Oral   Take 1 tablet by mouth daily.         Marland Kitchen gabapentin (NEURONTIN) 300 MG capsule   Oral   Take 1 capsule by mouth daily.         . iron polysaccharides (NIFEREX) 150 MG capsule   Oral   Take 150 mg by mouth daily.         Marland Kitchen loratadine (CLARITIN) 10 MG tablet   Oral   Take 10 mg by mouth daily.         . magnesium 30 MG tablet   Oral   Take 30 mg by mouth 2 (two) times daily.         . metoprolol tartrate (LOPRESSOR) 25 MG tablet   Oral   Take 1  tablet by mouth daily.         . Multiple Vitamins-Minerals (MULTIVITAMIN WITH MINERALS) tablet   Oral   Take 1 tablet by mouth daily.         . nitroGLYCERIN (NITRODUR - DOSED IN MG/24 HR) 0.2 mg/hr patch      1 patch daily.         . potassium chloride SA (K-DUR,KLOR-CON) 20 MEQ tablet   Oral   Take 1 tablet by mouth daily.         . ranitidine (ZANTAC) 150 MG capsule   Oral   Take 150 mg by mouth 2 (two) times daily.         Marland Kitchen triamcinolone (KENALOG) 0.025 % cream      1 application daily.           Allergies:  Morphine and related and Zoloft  Family History: Family History  Problem Relation Age of Onset  . Colon cancer Sister     Social History: Social History  Substance Use Topics  . Smoking status: Never Smoker   . Smokeless tobacco: Never Used  . Alcohol Use: No     Review of Systems:   10 point review of systems was performed and was otherwise negative:  Constitutional: No fever Eyes: No visual disturbances ENT: No sore throat, ear pain Cardiac: No chest pain Respiratory: No shortness of breath, wheezing, or stridor Abdomen: Lower middle abdominal pain with previous nausea and vomited 1. She denies any loose  stool or diarrhea. She denies any melena or hematochezia Endocrine: No weight loss, No night sweats Extremities: No peripheral edema, cyanosis Skin: No rashes, easy bruising Neurologic: No focal weakness, trouble with speech or swollowing Urologic: No dysuria, Hematuria, or urinary frequency   Physical Exam:  ED Triage Vitals  Enc Vitals Group     BP 11/28/15 1740 191/68 mmHg     Pulse Rate 11/28/15 1740 82     Resp 11/28/15 1740 18     Temp 11/28/15 1740 97.9 F (36.6 C)     Temp Source 11/28/15 1740 Oral     SpO2 11/28/15 1740 100 %     Weight 11/28/15 1740 142 lb (64.411 kg)     Height 11/28/15 1740 5\' 6"  (1.676 m)     Head Cir --      Peak Flow --      Pain Score 11/28/15 1740 10     Pain Loc --      Pain Edu? --      Excl. in [place]? --     General: Awake , Alert , and Oriented times 3; GCS 15 Head: Normal cephalic , atraumatic Eyes: Pupils equal , round, reactive to light Nose/Throat: No nasal drainage, patent upper airway without erythema or exudate.  Neck: Supple, Full range of motion, No anterior adenopathy or palpable thyroid masses Lungs: Clear to ascultation without wheezes , rhonchi, or rales Heart: Regular rate, regular rhythm without murmurs , gallops , or rubs Abdomen: Mild tenderness to deep palpation in lower middle quadrant without rebound, guarding , or rigidity; bowel sounds positive and symmetric in all 4 quadrants. No organomegaly .        Extremities: 2 plus symmetric pulses. No edema, clubbing or cyanosis Neurologic: normal ambulation, Motor symmetric without deficits, sensory intact Skin: warm, dry, no rashes   Labs:   All laboratory work was reviewed including any pertinent negatives or positives listed below:  Labs Reviewed  CBC - Abnormal; Notable for  the following:    RBC 3.71 (*)    All other components within normal limits  LIPASE, BLOOD  COMPREHENSIVE METABOLIC PANEL  URINALYSIS COMPLETEWITH MICROSCOPIC ([place])   review of  laboratory work showed no significant findings or changes from the patient's baseline labs   Radiology:   Narrative:    CLINICAL DATA: Patient awoke this morning with in mild upper abdominal pain, which has increased throughout the day. Pain in both upper quadrants. Patient now reports vomiting.  EXAM: CT ABDOMEN AND PELVIS WITHOUT CONTRAST  TECHNIQUE: Multidetector CT imaging of the abdomen and pelvis was performed following the standard protocol without IV contrast.  COMPARISON: 07/24/2008  FINDINGS: Lung bases: Reticulation and cystic changes at the lung bases are similar to the prior CT. Heart is top-normal in size.  Liver and spleen: Unremarkable.  Gallbladder surgically absent. There is chronic dilation of common bile duct to 9 mm.  Pancreas: Unremarkable.  Adrenal glands: No masses.  Kidneys, ureters, bladder: Bilateral renal scarring and more diffuse cortical thinning. Small low-density lesion in the upper pole the right kidney is likely a cyst. No hydronephrosis. Ureters normal course and caliber. Bladder is unremarkable.  Uterus and adnexa: Uterus surgically absent. No pelvic masses.  Lymph nodes: No adenopathy.  Ascites: None.  Vascular: Atherosclerotic calcifications along a normal caliber abdominal aorta and iliac arteries.  Gastrointestinal: There is dilated small bowel. The most dilated loop is in the pelvis where it measures 4.6 cm in diameter. This has a relatively abrupt transition to normal caliber small bowel in the right pelvis. There is mesenteric inflammation most prominent in the right mid abdomen. No colonic dilation. The left colon is decompressed. There are few sigmoid diverticula. No diverticulitis. No appendix is visualized.  Musculoskeletal: Left hip prosthesis appears well seated and aligned. Bones are demineralized. There are degenerative changes of the visualized spine. No osteoblastic or osteolytic  lesions.  IMPRESSION: 1. Small bowel dilation with an apparent transition point in the right pelvis with. This is consistent with a partial small bowel obstruction. There is associated mesenteric inflammation/edema. No bowel wall thickening is seen to suggest bowel inflammation or ischemia. 2. No other acute findings.       I personally reviewed the radiologic studies   ED Course:  Differential diagnosis includes but is not exclusive to o, acute appendicitis, urinary tract infection, , bowel obstruction, colitis, renal colic, gastroenteritis, ischemic bowel disease, aortic dissection ,etc.  Patient was given IV fluids, as well as Zofran for nausea, low-dose Toradol for pain. Patient overall had symptomatic relief of her abdominal pain while here in emergency department. She had an extensive evaluation which did not show any acute inflammatory changes suggestive of a surgical abdomen on her CAT scan. Repeat exam of her abdomen showed essentially resolution of all lower middle quadrant abdominal discomfort. I advised the family was with her along with the patient that if she has any increase in pain along with a fever or bloody stool that they should return to the emergency department. She was unlikely to be ischemic bowel disease.    Assessment:  Acute unspecified abdominal pain      Plan:  Outpatient management Patient was advised to return immediately if condition worsens. Patient was advised to follow up with their primary care physician or other specialized physicians involved in their outpatient care             Daymon Larsen, MD 11/28/15 2302

## 2015-12-02 DIAGNOSIS — R1083 Colic: Secondary | ICD-10-CM | POA: Diagnosis not present

## 2015-12-02 DIAGNOSIS — M109 Gout, unspecified: Secondary | ICD-10-CM | POA: Diagnosis not present

## 2015-12-02 DIAGNOSIS — I10 Essential (primary) hypertension: Secondary | ICD-10-CM | POA: Diagnosis not present

## 2015-12-02 DIAGNOSIS — R109 Unspecified abdominal pain: Secondary | ICD-10-CM | POA: Diagnosis not present

## 2016-01-24 DIAGNOSIS — I8390 Asymptomatic varicose veins of unspecified lower extremity: Secondary | ICD-10-CM | POA: Diagnosis not present

## 2016-01-24 DIAGNOSIS — I509 Heart failure, unspecified: Secondary | ICD-10-CM | POA: Diagnosis not present

## 2016-01-24 DIAGNOSIS — K210 Gastro-esophageal reflux disease with esophagitis: Secondary | ICD-10-CM | POA: Diagnosis not present

## 2016-01-24 DIAGNOSIS — F411 Generalized anxiety disorder: Secondary | ICD-10-CM | POA: Diagnosis not present

## 2016-05-01 DIAGNOSIS — I509 Heart failure, unspecified: Secondary | ICD-10-CM | POA: Diagnosis not present

## 2016-05-01 DIAGNOSIS — I87329 Chronic venous hypertension (idiopathic) with inflammation of unspecified lower extremity: Secondary | ICD-10-CM | POA: Diagnosis not present

## 2016-05-01 DIAGNOSIS — I8390 Asymptomatic varicose veins of unspecified lower extremity: Secondary | ICD-10-CM | POA: Diagnosis not present

## 2016-05-01 DIAGNOSIS — L0291 Cutaneous abscess, unspecified: Secondary | ICD-10-CM | POA: Diagnosis not present

## 2016-05-04 DIAGNOSIS — Z7982 Long term (current) use of aspirin: Secondary | ICD-10-CM | POA: Diagnosis not present

## 2016-05-04 DIAGNOSIS — L97812 Non-pressure chronic ulcer of other part of right lower leg with fat layer exposed: Secondary | ICD-10-CM | POA: Diagnosis not present

## 2016-05-04 DIAGNOSIS — L97522 Non-pressure chronic ulcer of other part of left foot with fat layer exposed: Secondary | ICD-10-CM | POA: Diagnosis not present

## 2016-05-04 DIAGNOSIS — F0390 Unspecified dementia without behavioral disturbance: Secondary | ICD-10-CM | POA: Diagnosis not present

## 2016-05-04 DIAGNOSIS — I87333 Chronic venous hypertension (idiopathic) with ulcer and inflammation of bilateral lower extremity: Secondary | ICD-10-CM | POA: Diagnosis not present

## 2016-05-04 DIAGNOSIS — I130 Hypertensive heart and chronic kidney disease with heart failure and stage 1 through stage 4 chronic kidney disease, or unspecified chronic kidney disease: Secondary | ICD-10-CM | POA: Diagnosis not present

## 2016-05-04 DIAGNOSIS — D510 Vitamin B12 deficiency anemia due to intrinsic factor deficiency: Secondary | ICD-10-CM | POA: Diagnosis not present

## 2016-05-04 DIAGNOSIS — I509 Heart failure, unspecified: Secondary | ICD-10-CM | POA: Diagnosis not present

## 2016-05-04 DIAGNOSIS — Z7901 Long term (current) use of anticoagulants: Secondary | ICD-10-CM | POA: Diagnosis not present

## 2016-05-04 DIAGNOSIS — F411 Generalized anxiety disorder: Secondary | ICD-10-CM | POA: Diagnosis not present

## 2016-05-04 DIAGNOSIS — E1122 Type 2 diabetes mellitus with diabetic chronic kidney disease: Secondary | ICD-10-CM | POA: Diagnosis not present

## 2016-05-04 DIAGNOSIS — N189 Chronic kidney disease, unspecified: Secondary | ICD-10-CM | POA: Diagnosis not present

## 2016-05-04 DIAGNOSIS — M109 Gout, unspecified: Secondary | ICD-10-CM | POA: Diagnosis not present

## 2016-05-04 DIAGNOSIS — Z9181 History of falling: Secondary | ICD-10-CM | POA: Diagnosis not present

## 2016-05-06 DIAGNOSIS — I130 Hypertensive heart and chronic kidney disease with heart failure and stage 1 through stage 4 chronic kidney disease, or unspecified chronic kidney disease: Secondary | ICD-10-CM | POA: Diagnosis not present

## 2016-05-06 DIAGNOSIS — N189 Chronic kidney disease, unspecified: Secondary | ICD-10-CM | POA: Diagnosis not present

## 2016-05-06 DIAGNOSIS — I509 Heart failure, unspecified: Secondary | ICD-10-CM | POA: Diagnosis not present

## 2016-05-06 DIAGNOSIS — L97812 Non-pressure chronic ulcer of other part of right lower leg with fat layer exposed: Secondary | ICD-10-CM | POA: Diagnosis not present

## 2016-05-06 DIAGNOSIS — L97522 Non-pressure chronic ulcer of other part of left foot with fat layer exposed: Secondary | ICD-10-CM | POA: Diagnosis not present

## 2016-05-06 DIAGNOSIS — I87333 Chronic venous hypertension (idiopathic) with ulcer and inflammation of bilateral lower extremity: Secondary | ICD-10-CM | POA: Diagnosis not present

## 2016-05-08 DIAGNOSIS — L97522 Non-pressure chronic ulcer of other part of left foot with fat layer exposed: Secondary | ICD-10-CM | POA: Diagnosis not present

## 2016-05-08 DIAGNOSIS — N189 Chronic kidney disease, unspecified: Secondary | ICD-10-CM | POA: Diagnosis not present

## 2016-05-08 DIAGNOSIS — I130 Hypertensive heart and chronic kidney disease with heart failure and stage 1 through stage 4 chronic kidney disease, or unspecified chronic kidney disease: Secondary | ICD-10-CM | POA: Diagnosis not present

## 2016-05-08 DIAGNOSIS — I87333 Chronic venous hypertension (idiopathic) with ulcer and inflammation of bilateral lower extremity: Secondary | ICD-10-CM | POA: Diagnosis not present

## 2016-05-08 DIAGNOSIS — L97812 Non-pressure chronic ulcer of other part of right lower leg with fat layer exposed: Secondary | ICD-10-CM | POA: Diagnosis not present

## 2016-05-08 DIAGNOSIS — I509 Heart failure, unspecified: Secondary | ICD-10-CM | POA: Diagnosis not present

## 2016-05-11 DIAGNOSIS — N189 Chronic kidney disease, unspecified: Secondary | ICD-10-CM | POA: Diagnosis not present

## 2016-05-11 DIAGNOSIS — I87333 Chronic venous hypertension (idiopathic) with ulcer and inflammation of bilateral lower extremity: Secondary | ICD-10-CM | POA: Diagnosis not present

## 2016-05-11 DIAGNOSIS — L97522 Non-pressure chronic ulcer of other part of left foot with fat layer exposed: Secondary | ICD-10-CM | POA: Diagnosis not present

## 2016-05-11 DIAGNOSIS — I509 Heart failure, unspecified: Secondary | ICD-10-CM | POA: Diagnosis not present

## 2016-05-11 DIAGNOSIS — I130 Hypertensive heart and chronic kidney disease with heart failure and stage 1 through stage 4 chronic kidney disease, or unspecified chronic kidney disease: Secondary | ICD-10-CM | POA: Diagnosis not present

## 2016-05-11 DIAGNOSIS — L97812 Non-pressure chronic ulcer of other part of right lower leg with fat layer exposed: Secondary | ICD-10-CM | POA: Diagnosis not present

## 2016-05-14 DIAGNOSIS — I130 Hypertensive heart and chronic kidney disease with heart failure and stage 1 through stage 4 chronic kidney disease, or unspecified chronic kidney disease: Secondary | ICD-10-CM | POA: Diagnosis not present

## 2016-05-14 DIAGNOSIS — L97812 Non-pressure chronic ulcer of other part of right lower leg with fat layer exposed: Secondary | ICD-10-CM | POA: Diagnosis not present

## 2016-05-14 DIAGNOSIS — N189 Chronic kidney disease, unspecified: Secondary | ICD-10-CM | POA: Diagnosis not present

## 2016-05-14 DIAGNOSIS — I509 Heart failure, unspecified: Secondary | ICD-10-CM | POA: Diagnosis not present

## 2016-05-14 DIAGNOSIS — L97522 Non-pressure chronic ulcer of other part of left foot with fat layer exposed: Secondary | ICD-10-CM | POA: Diagnosis not present

## 2016-05-14 DIAGNOSIS — I87333 Chronic venous hypertension (idiopathic) with ulcer and inflammation of bilateral lower extremity: Secondary | ICD-10-CM | POA: Diagnosis not present

## 2016-05-22 DIAGNOSIS — L97812 Non-pressure chronic ulcer of other part of right lower leg with fat layer exposed: Secondary | ICD-10-CM | POA: Diagnosis not present

## 2016-05-22 DIAGNOSIS — N189 Chronic kidney disease, unspecified: Secondary | ICD-10-CM | POA: Diagnosis not present

## 2016-05-22 DIAGNOSIS — L97522 Non-pressure chronic ulcer of other part of left foot with fat layer exposed: Secondary | ICD-10-CM | POA: Diagnosis not present

## 2016-05-22 DIAGNOSIS — I509 Heart failure, unspecified: Secondary | ICD-10-CM | POA: Diagnosis not present

## 2016-05-22 DIAGNOSIS — I87333 Chronic venous hypertension (idiopathic) with ulcer and inflammation of bilateral lower extremity: Secondary | ICD-10-CM | POA: Diagnosis not present

## 2016-05-22 DIAGNOSIS — I130 Hypertensive heart and chronic kidney disease with heart failure and stage 1 through stage 4 chronic kidney disease, or unspecified chronic kidney disease: Secondary | ICD-10-CM | POA: Diagnosis not present

## 2016-05-27 DIAGNOSIS — I509 Heart failure, unspecified: Secondary | ICD-10-CM | POA: Diagnosis not present

## 2016-05-27 DIAGNOSIS — I130 Hypertensive heart and chronic kidney disease with heart failure and stage 1 through stage 4 chronic kidney disease, or unspecified chronic kidney disease: Secondary | ICD-10-CM | POA: Diagnosis not present

## 2016-05-27 DIAGNOSIS — L97522 Non-pressure chronic ulcer of other part of left foot with fat layer exposed: Secondary | ICD-10-CM | POA: Diagnosis not present

## 2016-05-27 DIAGNOSIS — L97812 Non-pressure chronic ulcer of other part of right lower leg with fat layer exposed: Secondary | ICD-10-CM | POA: Diagnosis not present

## 2016-05-27 DIAGNOSIS — N189 Chronic kidney disease, unspecified: Secondary | ICD-10-CM | POA: Diagnosis not present

## 2016-05-27 DIAGNOSIS — I87333 Chronic venous hypertension (idiopathic) with ulcer and inflammation of bilateral lower extremity: Secondary | ICD-10-CM | POA: Diagnosis not present

## 2016-06-02 DIAGNOSIS — D649 Anemia, unspecified: Secondary | ICD-10-CM | POA: Diagnosis not present

## 2016-06-02 DIAGNOSIS — I872 Venous insufficiency (chronic) (peripheral): Secondary | ICD-10-CM | POA: Diagnosis not present

## 2016-06-02 DIAGNOSIS — L97929 Non-pressure chronic ulcer of unspecified part of left lower leg with unspecified severity: Secondary | ICD-10-CM | POA: Diagnosis not present

## 2016-06-03 DIAGNOSIS — L97812 Non-pressure chronic ulcer of other part of right lower leg with fat layer exposed: Secondary | ICD-10-CM | POA: Diagnosis not present

## 2016-06-03 DIAGNOSIS — I509 Heart failure, unspecified: Secondary | ICD-10-CM | POA: Diagnosis not present

## 2016-06-03 DIAGNOSIS — I130 Hypertensive heart and chronic kidney disease with heart failure and stage 1 through stage 4 chronic kidney disease, or unspecified chronic kidney disease: Secondary | ICD-10-CM | POA: Diagnosis not present

## 2016-06-03 DIAGNOSIS — L97522 Non-pressure chronic ulcer of other part of left foot with fat layer exposed: Secondary | ICD-10-CM | POA: Diagnosis not present

## 2016-06-03 DIAGNOSIS — N189 Chronic kidney disease, unspecified: Secondary | ICD-10-CM | POA: Diagnosis not present

## 2016-06-03 DIAGNOSIS — I87333 Chronic venous hypertension (idiopathic) with ulcer and inflammation of bilateral lower extremity: Secondary | ICD-10-CM | POA: Diagnosis not present

## 2016-06-10 DIAGNOSIS — I87333 Chronic venous hypertension (idiopathic) with ulcer and inflammation of bilateral lower extremity: Secondary | ICD-10-CM | POA: Diagnosis not present

## 2016-06-10 DIAGNOSIS — L97522 Non-pressure chronic ulcer of other part of left foot with fat layer exposed: Secondary | ICD-10-CM | POA: Diagnosis not present

## 2016-06-10 DIAGNOSIS — N189 Chronic kidney disease, unspecified: Secondary | ICD-10-CM | POA: Diagnosis not present

## 2016-06-10 DIAGNOSIS — I509 Heart failure, unspecified: Secondary | ICD-10-CM | POA: Diagnosis not present

## 2016-06-10 DIAGNOSIS — I130 Hypertensive heart and chronic kidney disease with heart failure and stage 1 through stage 4 chronic kidney disease, or unspecified chronic kidney disease: Secondary | ICD-10-CM | POA: Diagnosis not present

## 2016-06-10 DIAGNOSIS — L97812 Non-pressure chronic ulcer of other part of right lower leg with fat layer exposed: Secondary | ICD-10-CM | POA: Diagnosis not present

## 2016-06-17 DIAGNOSIS — I87333 Chronic venous hypertension (idiopathic) with ulcer and inflammation of bilateral lower extremity: Secondary | ICD-10-CM | POA: Diagnosis not present

## 2016-06-17 DIAGNOSIS — I509 Heart failure, unspecified: Secondary | ICD-10-CM | POA: Diagnosis not present

## 2016-06-17 DIAGNOSIS — I130 Hypertensive heart and chronic kidney disease with heart failure and stage 1 through stage 4 chronic kidney disease, or unspecified chronic kidney disease: Secondary | ICD-10-CM | POA: Diagnosis not present

## 2016-06-17 DIAGNOSIS — N189 Chronic kidney disease, unspecified: Secondary | ICD-10-CM | POA: Diagnosis not present

## 2016-06-17 DIAGNOSIS — L97812 Non-pressure chronic ulcer of other part of right lower leg with fat layer exposed: Secondary | ICD-10-CM | POA: Diagnosis not present

## 2016-06-17 DIAGNOSIS — L97522 Non-pressure chronic ulcer of other part of left foot with fat layer exposed: Secondary | ICD-10-CM | POA: Diagnosis not present

## 2016-06-24 DIAGNOSIS — I130 Hypertensive heart and chronic kidney disease with heart failure and stage 1 through stage 4 chronic kidney disease, or unspecified chronic kidney disease: Secondary | ICD-10-CM | POA: Diagnosis not present

## 2016-06-24 DIAGNOSIS — I87333 Chronic venous hypertension (idiopathic) with ulcer and inflammation of bilateral lower extremity: Secondary | ICD-10-CM | POA: Diagnosis not present

## 2016-06-24 DIAGNOSIS — I509 Heart failure, unspecified: Secondary | ICD-10-CM | POA: Diagnosis not present

## 2016-06-24 DIAGNOSIS — L97522 Non-pressure chronic ulcer of other part of left foot with fat layer exposed: Secondary | ICD-10-CM | POA: Diagnosis not present

## 2016-06-24 DIAGNOSIS — N189 Chronic kidney disease, unspecified: Secondary | ICD-10-CM | POA: Diagnosis not present

## 2016-06-24 DIAGNOSIS — L97812 Non-pressure chronic ulcer of other part of right lower leg with fat layer exposed: Secondary | ICD-10-CM | POA: Diagnosis not present

## 2016-07-01 DIAGNOSIS — I130 Hypertensive heart and chronic kidney disease with heart failure and stage 1 through stage 4 chronic kidney disease, or unspecified chronic kidney disease: Secondary | ICD-10-CM | POA: Diagnosis not present

## 2016-07-01 DIAGNOSIS — N189 Chronic kidney disease, unspecified: Secondary | ICD-10-CM | POA: Diagnosis not present

## 2016-07-01 DIAGNOSIS — I87333 Chronic venous hypertension (idiopathic) with ulcer and inflammation of bilateral lower extremity: Secondary | ICD-10-CM | POA: Diagnosis not present

## 2016-07-01 DIAGNOSIS — L97522 Non-pressure chronic ulcer of other part of left foot with fat layer exposed: Secondary | ICD-10-CM | POA: Diagnosis not present

## 2016-07-01 DIAGNOSIS — L97812 Non-pressure chronic ulcer of other part of right lower leg with fat layer exposed: Secondary | ICD-10-CM | POA: Diagnosis not present

## 2016-07-01 DIAGNOSIS — I509 Heart failure, unspecified: Secondary | ICD-10-CM | POA: Diagnosis not present

## 2016-07-03 DIAGNOSIS — I130 Hypertensive heart and chronic kidney disease with heart failure and stage 1 through stage 4 chronic kidney disease, or unspecified chronic kidney disease: Secondary | ICD-10-CM | POA: Diagnosis not present

## 2016-07-03 DIAGNOSIS — M109 Gout, unspecified: Secondary | ICD-10-CM | POA: Diagnosis not present

## 2016-07-03 DIAGNOSIS — Z9181 History of falling: Secondary | ICD-10-CM | POA: Diagnosis not present

## 2016-07-03 DIAGNOSIS — F411 Generalized anxiety disorder: Secondary | ICD-10-CM | POA: Diagnosis not present

## 2016-07-03 DIAGNOSIS — N189 Chronic kidney disease, unspecified: Secondary | ICD-10-CM | POA: Diagnosis not present

## 2016-07-03 DIAGNOSIS — F0390 Unspecified dementia without behavioral disturbance: Secondary | ICD-10-CM | POA: Diagnosis not present

## 2016-07-03 DIAGNOSIS — Z7901 Long term (current) use of anticoagulants: Secondary | ICD-10-CM | POA: Diagnosis not present

## 2016-07-03 DIAGNOSIS — L97812 Non-pressure chronic ulcer of other part of right lower leg with fat layer exposed: Secondary | ICD-10-CM | POA: Diagnosis not present

## 2016-07-03 DIAGNOSIS — I87333 Chronic venous hypertension (idiopathic) with ulcer and inflammation of bilateral lower extremity: Secondary | ICD-10-CM | POA: Diagnosis not present

## 2016-07-03 DIAGNOSIS — E1122 Type 2 diabetes mellitus with diabetic chronic kidney disease: Secondary | ICD-10-CM | POA: Diagnosis not present

## 2016-07-03 DIAGNOSIS — Z7982 Long term (current) use of aspirin: Secondary | ICD-10-CM | POA: Diagnosis not present

## 2016-07-03 DIAGNOSIS — D510 Vitamin B12 deficiency anemia due to intrinsic factor deficiency: Secondary | ICD-10-CM | POA: Diagnosis not present

## 2016-07-03 DIAGNOSIS — I509 Heart failure, unspecified: Secondary | ICD-10-CM | POA: Diagnosis not present

## 2016-07-03 DIAGNOSIS — L97522 Non-pressure chronic ulcer of other part of left foot with fat layer exposed: Secondary | ICD-10-CM | POA: Diagnosis not present

## 2016-07-06 DIAGNOSIS — H16223 Keratoconjunctivitis sicca, not specified as Sjogren's, bilateral: Secondary | ICD-10-CM | POA: Diagnosis not present

## 2016-07-08 DIAGNOSIS — L97812 Non-pressure chronic ulcer of other part of right lower leg with fat layer exposed: Secondary | ICD-10-CM | POA: Diagnosis not present

## 2016-07-08 DIAGNOSIS — L97522 Non-pressure chronic ulcer of other part of left foot with fat layer exposed: Secondary | ICD-10-CM | POA: Diagnosis not present

## 2016-07-08 DIAGNOSIS — I509 Heart failure, unspecified: Secondary | ICD-10-CM | POA: Diagnosis not present

## 2016-07-08 DIAGNOSIS — N189 Chronic kidney disease, unspecified: Secondary | ICD-10-CM | POA: Diagnosis not present

## 2016-07-08 DIAGNOSIS — I87333 Chronic venous hypertension (idiopathic) with ulcer and inflammation of bilateral lower extremity: Secondary | ICD-10-CM | POA: Diagnosis not present

## 2016-07-08 DIAGNOSIS — I130 Hypertensive heart and chronic kidney disease with heart failure and stage 1 through stage 4 chronic kidney disease, or unspecified chronic kidney disease: Secondary | ICD-10-CM | POA: Diagnosis not present

## 2016-07-15 DIAGNOSIS — I87333 Chronic venous hypertension (idiopathic) with ulcer and inflammation of bilateral lower extremity: Secondary | ICD-10-CM | POA: Diagnosis not present

## 2016-07-15 DIAGNOSIS — L97812 Non-pressure chronic ulcer of other part of right lower leg with fat layer exposed: Secondary | ICD-10-CM | POA: Diagnosis not present

## 2016-07-15 DIAGNOSIS — L97522 Non-pressure chronic ulcer of other part of left foot with fat layer exposed: Secondary | ICD-10-CM | POA: Diagnosis not present

## 2016-07-15 DIAGNOSIS — N189 Chronic kidney disease, unspecified: Secondary | ICD-10-CM | POA: Diagnosis not present

## 2016-07-15 DIAGNOSIS — I130 Hypertensive heart and chronic kidney disease with heart failure and stage 1 through stage 4 chronic kidney disease, or unspecified chronic kidney disease: Secondary | ICD-10-CM | POA: Diagnosis not present

## 2016-07-15 DIAGNOSIS — I509 Heart failure, unspecified: Secondary | ICD-10-CM | POA: Diagnosis not present

## 2016-07-21 DIAGNOSIS — I130 Hypertensive heart and chronic kidney disease with heart failure and stage 1 through stage 4 chronic kidney disease, or unspecified chronic kidney disease: Secondary | ICD-10-CM | POA: Diagnosis not present

## 2016-07-21 DIAGNOSIS — N189 Chronic kidney disease, unspecified: Secondary | ICD-10-CM | POA: Diagnosis not present

## 2016-07-21 DIAGNOSIS — I509 Heart failure, unspecified: Secondary | ICD-10-CM | POA: Diagnosis not present

## 2016-07-21 DIAGNOSIS — I87333 Chronic venous hypertension (idiopathic) with ulcer and inflammation of bilateral lower extremity: Secondary | ICD-10-CM | POA: Diagnosis not present

## 2016-07-21 DIAGNOSIS — L97522 Non-pressure chronic ulcer of other part of left foot with fat layer exposed: Secondary | ICD-10-CM | POA: Diagnosis not present

## 2016-07-21 DIAGNOSIS — L97812 Non-pressure chronic ulcer of other part of right lower leg with fat layer exposed: Secondary | ICD-10-CM | POA: Diagnosis not present

## 2016-07-28 DIAGNOSIS — L97812 Non-pressure chronic ulcer of other part of right lower leg with fat layer exposed: Secondary | ICD-10-CM | POA: Diagnosis not present

## 2016-07-28 DIAGNOSIS — I130 Hypertensive heart and chronic kidney disease with heart failure and stage 1 through stage 4 chronic kidney disease, or unspecified chronic kidney disease: Secondary | ICD-10-CM | POA: Diagnosis not present

## 2016-07-28 DIAGNOSIS — L97522 Non-pressure chronic ulcer of other part of left foot with fat layer exposed: Secondary | ICD-10-CM | POA: Diagnosis not present

## 2016-07-28 DIAGNOSIS — I509 Heart failure, unspecified: Secondary | ICD-10-CM | POA: Diagnosis not present

## 2016-07-28 DIAGNOSIS — I87333 Chronic venous hypertension (idiopathic) with ulcer and inflammation of bilateral lower extremity: Secondary | ICD-10-CM | POA: Diagnosis not present

## 2016-07-28 DIAGNOSIS — N189 Chronic kidney disease, unspecified: Secondary | ICD-10-CM | POA: Diagnosis not present

## 2016-08-09 DIAGNOSIS — N189 Chronic kidney disease, unspecified: Secondary | ICD-10-CM | POA: Diagnosis not present

## 2016-08-09 DIAGNOSIS — I87333 Chronic venous hypertension (idiopathic) with ulcer and inflammation of bilateral lower extremity: Secondary | ICD-10-CM | POA: Diagnosis not present

## 2016-08-09 DIAGNOSIS — L97522 Non-pressure chronic ulcer of other part of left foot with fat layer exposed: Secondary | ICD-10-CM | POA: Diagnosis not present

## 2016-08-09 DIAGNOSIS — L97812 Non-pressure chronic ulcer of other part of right lower leg with fat layer exposed: Secondary | ICD-10-CM | POA: Diagnosis not present

## 2016-08-09 DIAGNOSIS — I130 Hypertensive heart and chronic kidney disease with heart failure and stage 1 through stage 4 chronic kidney disease, or unspecified chronic kidney disease: Secondary | ICD-10-CM | POA: Diagnosis not present

## 2016-08-09 DIAGNOSIS — I509 Heart failure, unspecified: Secondary | ICD-10-CM | POA: Diagnosis not present

## 2016-08-19 DIAGNOSIS — L97812 Non-pressure chronic ulcer of other part of right lower leg with fat layer exposed: Secondary | ICD-10-CM | POA: Diagnosis not present

## 2016-08-19 DIAGNOSIS — I509 Heart failure, unspecified: Secondary | ICD-10-CM | POA: Diagnosis not present

## 2016-08-19 DIAGNOSIS — N189 Chronic kidney disease, unspecified: Secondary | ICD-10-CM | POA: Diagnosis not present

## 2016-08-19 DIAGNOSIS — I130 Hypertensive heart and chronic kidney disease with heart failure and stage 1 through stage 4 chronic kidney disease, or unspecified chronic kidney disease: Secondary | ICD-10-CM | POA: Diagnosis not present

## 2016-08-19 DIAGNOSIS — L97522 Non-pressure chronic ulcer of other part of left foot with fat layer exposed: Secondary | ICD-10-CM | POA: Diagnosis not present

## 2016-08-19 DIAGNOSIS — I87333 Chronic venous hypertension (idiopathic) with ulcer and inflammation of bilateral lower extremity: Secondary | ICD-10-CM | POA: Diagnosis not present

## 2016-08-20 ENCOUNTER — Emergency Department: Payer: Medicare Other

## 2016-08-20 ENCOUNTER — Encounter: Payer: Self-pay | Admitting: Urgent Care

## 2016-08-20 ENCOUNTER — Emergency Department
Admission: EM | Admit: 2016-08-20 | Discharge: 2016-08-20 | Disposition: A | Discharge status: Other Acute Inpatient Hospital | Payer: Medicare Other | Attending: Emergency Medicine | Admitting: Emergency Medicine

## 2016-08-20 DIAGNOSIS — W01198A Fall on same level from slipping, tripping and stumbling with subsequent striking against other object, initial encounter: Secondary | ICD-10-CM | POA: Diagnosis not present

## 2016-08-20 DIAGNOSIS — D62 Acute posthemorrhagic anemia: Secondary | ICD-10-CM | POA: Diagnosis not present

## 2016-08-20 DIAGNOSIS — Z9660 Presence of unspecified orthopedic joint implant: Secondary | ICD-10-CM | POA: Insufficient documentation

## 2016-08-20 DIAGNOSIS — S42413A Displaced simple supracondylar fracture without intercondylar fracture of unspecified humerus, initial encounter for closed fracture: Secondary | ICD-10-CM | POA: Diagnosis not present

## 2016-08-20 DIAGNOSIS — S42412A Displaced simple supracondylar fracture without intercondylar fracture of left humerus, initial encounter for closed fracture: Secondary | ICD-10-CM | POA: Diagnosis not present

## 2016-08-20 DIAGNOSIS — Z7982 Long term (current) use of aspirin: Secondary | ICD-10-CM | POA: Diagnosis not present

## 2016-08-20 DIAGNOSIS — Y92009 Unspecified place in unspecified non-institutional (private) residence as the place of occurrence of the external cause: Secondary | ICD-10-CM | POA: Diagnosis not present

## 2016-08-20 DIAGNOSIS — S0003XA Contusion of scalp, initial encounter: Secondary | ICD-10-CM | POA: Diagnosis not present

## 2016-08-20 DIAGNOSIS — S0990XA Unspecified injury of head, initial encounter: Secondary | ICD-10-CM | POA: Diagnosis present

## 2016-08-20 DIAGNOSIS — J449 Chronic obstructive pulmonary disease, unspecified: Secondary | ICD-10-CM | POA: Diagnosis not present

## 2016-08-20 DIAGNOSIS — S80211A Abrasion, right knee, initial encounter: Secondary | ICD-10-CM

## 2016-08-20 DIAGNOSIS — Z6821 Body mass index (BMI) 21.0-21.9, adult: Secondary | ICD-10-CM | POA: Diagnosis not present

## 2016-08-20 DIAGNOSIS — Z23 Encounter for immunization: Secondary | ICD-10-CM | POA: Diagnosis not present

## 2016-08-20 DIAGNOSIS — I509 Heart failure, unspecified: Secondary | ICD-10-CM | POA: Diagnosis not present

## 2016-08-20 DIAGNOSIS — S0012XA Contusion of left eyelid and periocular area, initial encounter: Secondary | ICD-10-CM | POA: Diagnosis not present

## 2016-08-20 DIAGNOSIS — Y999 Unspecified external cause status: Secondary | ICD-10-CM | POA: Diagnosis not present

## 2016-08-20 DIAGNOSIS — S0512XA Contusion of eyeball and orbital tissues, left eye, initial encounter: Secondary | ICD-10-CM | POA: Diagnosis not present

## 2016-08-20 DIAGNOSIS — N179 Acute kidney failure, unspecified: Secondary | ICD-10-CM | POA: Diagnosis not present

## 2016-08-20 DIAGNOSIS — S80212A Abrasion, left knee, initial encounter: Secondary | ICD-10-CM | POA: Insufficient documentation

## 2016-08-20 DIAGNOSIS — S8991XA Unspecified injury of right lower leg, initial encounter: Secondary | ICD-10-CM | POA: Diagnosis not present

## 2016-08-20 DIAGNOSIS — S0083XA Contusion of other part of head, initial encounter: Secondary | ICD-10-CM | POA: Insufficient documentation

## 2016-08-20 DIAGNOSIS — D696 Thrombocytopenia, unspecified: Secondary | ICD-10-CM | POA: Diagnosis not present

## 2016-08-20 DIAGNOSIS — S199XXA Unspecified injury of neck, initial encounter: Secondary | ICD-10-CM | POA: Diagnosis not present

## 2016-08-20 DIAGNOSIS — S42292A Other displaced fracture of upper end of left humerus, initial encounter for closed fracture: Secondary | ICD-10-CM | POA: Diagnosis not present

## 2016-08-20 DIAGNOSIS — M7989 Other specified soft tissue disorders: Secondary | ICD-10-CM | POA: Diagnosis not present

## 2016-08-20 DIAGNOSIS — L97819 Non-pressure chronic ulcer of other part of right lower leg with unspecified severity: Secondary | ICD-10-CM | POA: Diagnosis not present

## 2016-08-20 DIAGNOSIS — S8992XA Unspecified injury of left lower leg, initial encounter: Secondary | ICD-10-CM | POA: Diagnosis not present

## 2016-08-20 DIAGNOSIS — W19XXXA Unspecified fall, initial encounter: Secondary | ICD-10-CM

## 2016-08-20 DIAGNOSIS — S42302A Unspecified fracture of shaft of humerus, left arm, initial encounter for closed fracture: Secondary | ICD-10-CM | POA: Diagnosis not present

## 2016-08-20 DIAGNOSIS — S0993XA Unspecified injury of face, initial encounter: Secondary | ICD-10-CM | POA: Diagnosis not present

## 2016-08-20 DIAGNOSIS — M79622 Pain in left upper arm: Secondary | ICD-10-CM | POA: Diagnosis not present

## 2016-08-20 DIAGNOSIS — S81802A Unspecified open wound, left lower leg, initial encounter: Secondary | ICD-10-CM | POA: Diagnosis not present

## 2016-08-20 DIAGNOSIS — Y93E5 Activity, floor mopping and cleaning: Secondary | ICD-10-CM | POA: Diagnosis not present

## 2016-08-20 DIAGNOSIS — S81812A Laceration without foreign body, left lower leg, initial encounter: Secondary | ICD-10-CM | POA: Diagnosis not present

## 2016-08-20 DIAGNOSIS — I130 Hypertensive heart and chronic kidney disease with heart failure and stage 1 through stage 4 chronic kidney disease, or unspecified chronic kidney disease: Secondary | ICD-10-CM | POA: Diagnosis not present

## 2016-08-20 LAB — BASIC METABOLIC PANEL
ANION GAP: 9 (ref 5–15)
BUN: 39 mg/dL — ABNORMAL HIGH (ref 6–20)
CO2: 29 mmol/L (ref 22–32)
Calcium: 9.7 mg/dL (ref 8.9–10.3)
Chloride: 101 mmol/L (ref 101–111)
Creatinine, Ser: 1.65 mg/dL — ABNORMAL HIGH (ref 0.44–1.00)
GFR, EST AFRICAN AMERICAN: 30 mL/min — AB (ref >60)
GFR, EST NON AFRICAN AMERICAN: 26 mL/min — AB (ref >60)
GLUCOSE: 113 mg/dL — AB (ref 65–99)
POTASSIUM: 4.8 mmol/L (ref 3.5–5.1)
Sodium: 139 mmol/L (ref 135–145)

## 2016-08-20 LAB — CBC
HCT: 32.4 % — ABNORMAL LOW (ref 35.0–47.0)
Hemoglobin: 11.1 g/dL — ABNORMAL LOW (ref 12.0–16.0)
MCH: 32.4 pg (ref 26.0–34.0)
MCHC: 34.1 g/dL (ref 32.0–36.0)
MCV: 94.8 fL (ref 80.0–100.0)
PLATELETS: 133 10*3/uL — AB (ref 150–440)
RBC: 3.41 MIL/uL — AB (ref 3.80–5.20)
RDW: 14 % (ref 11.5–14.5)
WBC: 10 10*3/uL (ref 3.6–11.0)

## 2016-08-20 LAB — PROTIME-INR
INR: 1.01
PROTHROMBIN TIME: 13.3 s (ref 11.4–15.2)

## 2016-08-20 MED ORDER — SODIUM CHLORIDE 0.9 % IV SOLN: Freq: Once | INTRAVENOUS | Status: DC

## 2016-08-20 MED ORDER — TETANUS-DIPHTH-ACELL PERTUSSIS 5-2.5-18.5 LF-MCG/0.5 IM SUSP
0.5000 mL | Freq: Once | INTRAMUSCULAR | Status: AC
  Administered 2016-08-20: 0.5 mL via INTRAMUSCULAR
  Filled 2016-08-20: qty 0.5

## 2016-08-20 MED ORDER — SODIUM CHLORIDE 0.9 % IV BOLUS (SEPSIS)
500.0000 mL | Freq: Once | INTRAVENOUS | Status: AC
  Administered 2016-08-20: 500 mL via INTRAVENOUS

## 2016-08-20 MED ORDER — FENTANYL CITRATE (PF) 100 MCG/2ML IJ SOLN
100.0000 ug | Freq: Once | INTRAMUSCULAR | Status: AC
  Administered 2016-08-20: 100 ug via INTRAVENOUS

## 2016-08-20 MED ORDER — FENTANYL CITRATE (PF) 100 MCG/2ML IJ SOLN
50.0000 ug | Freq: Once | INTRAMUSCULAR | Status: AC
  Administered 2016-08-20: 50 ug via INTRAVENOUS
  Filled 2016-08-20: qty 1
  Filled 2016-08-20: qty 2

## 2016-08-20 MED ORDER — BACITRACIN ZINC 500 UNIT/GM EX OINT
TOPICAL_OINTMENT | CUTANEOUS | Status: AC
  Administered 2016-08-20: 23:00:00
  Filled 2016-08-20: qty 0.9

## 2016-08-20 MED ORDER — FENTANYL CITRATE (PF) 100 MCG/2ML IJ SOLN
INTRAMUSCULAR | Status: AC
  Administered 2016-08-20: 100 ug via INTRAVENOUS
  Filled 2016-08-20: qty 2

## 2016-08-20 NOTE — ED Notes (Signed)
Wound to LEFT knee cleansed with NS and dried thoroughly. Attempted to replace skin, however it would not approximated. MD aware. Bacitracin applied, covered with Adaptic, and wrapped in kerlix.

## 2016-08-20 NOTE — ED Notes (Signed)
Sling and cervical collar applied per MD order. MD has obtained negative CT imaging of cervical spine, however wants patient transported with collar in place. EMS aware.

## 2016-08-20 NOTE — ED Triage Notes (Addendum)
Patient presents to ED 30 via EMS from home. Patient reported to have been cleaning up dog urine when she slipped and fell into a "hard wood table". Patient with significant trauma to her head and face; denies LOC.  (+) deformity noted to LUE. Patient also with pain and swelling to bilateral knees; bruising observed. Patient with a significant skin tear to LEFT knee. Patient denies that she was dizzy at the time, however she is reporting vertiginous symptoms at this time.

## 2016-08-20 NOTE — ED Provider Notes (Signed)
[facility] Emergency Department Provider Note  ____________________________________________  Time seen: Approximately 8:06 PM  I have reviewed the triage vital signs and the nursing notes.   HISTORY  Chief Complaint Fall; Head Injury; Arm Injury; Knee Pain; and Facial Injury   HPI [name] [name] is a 82 y.o. female history of CHF, COPD, hyperlipidemia, PVD on Plavix who presents after a mechanical fall with trauma to the face. Patient was cleaning her dog's urine on the floor, when she slipped on it and fell face forward onto a solid wood coffee table. She hit her face and is complaining of severe pain on the face and dizziness since the fall. She denies LOC. She also is complaining of severe pain on her Left arm which has an obvious deformity. Patient denies dizziness, headaches, palpitations, chest pain, shortness of breath, back pain preceding her fall.   Past Medical History:  Diagnosis Date  . Anemia   . CHF (congestive heart failure) ([place])   . Emphysema of lung ([place])   . Hyperlipidemia   . Neuropathy ([place])   . Osteoporosis     Patient Active Problem List   Diagnosis Date Noted  . PAD (peripheral artery disease) ([place]) 10/05/2013  . Chronic venous insufficiency 10/05/2013    Past Surgical History:  Procedure Laterality Date  . ABDOMINAL HYSTERECTOMY  1982  . CHOLECYSTECTOMY  1992  . JOINT REPLACEMENT  2005    Prior to Admission medications   Medication Sig Start Date End Date Taking? Authorizing Provider  ALPRAZolam Duanne Moron) 0.25 MG tablet Take 1 tablet by mouth daily. 09/18/13   Historical Provider, MD  aspirin 81 MG tablet Take 81 mg by mouth daily.    Historical Provider, MD  calcium carbonate 200 MG capsule Take 250 mg by mouth 2 (two) times daily with a meal.    Historical Provider, MD  cholecalciferol (VITAMIN D) 400 UNITS TABS tablet Take 400 Units by mouth.    Historical Provider, MD  clopidogrel (PLAVIX) 75 MG tablet Take 1 tablet  by mouth daily. 09/18/13   Historical Provider, MD  cyanocobalamin 1000 MCG tablet Take 100 mcg by mouth daily.    Historical Provider, MD  dicyclomine (BENTYL) 20 MG tablet Take 1 tablet (20 mg total) by mouth 3 (three) times daily as needed for spasms. 11/28/15   Daymon Larsen, MD  FLECTOR 1.3 % PTCH 1 patch daily. 09/18/13   Historical Provider, MD  furosemide (LASIX) 20 MG tablet Take 1 tablet by mouth daily. 09/18/13   Historical Provider, MD  gabapentin (NEURONTIN) 300 MG capsule Take 1 capsule by mouth daily. 09/04/13   Historical Provider, MD  iron polysaccharides (NIFEREX) 150 MG capsule Take 150 mg by mouth daily.    Historical Provider, MD  loratadine (CLARITIN) 10 MG tablet Take 10 mg by mouth daily.    Historical Provider, MD  magnesium 30 MG tablet Take 30 mg by mouth 2 (two) times daily.    Historical Provider, MD  metoprolol tartrate (LOPRESSOR) 25 MG tablet Take 1 tablet by mouth daily. 09/18/13   Historical Provider, MD  Multiple Vitamins-Minerals (MULTIVITAMIN WITH MINERALS) tablet Take 1 tablet by mouth daily.    Historical Provider, MD  nitroGLYCERIN (NITRODUR - DOSED IN MG/24 HR) 0.2 mg/hr patch 1 patch daily. 09/18/13   Historical Provider, MD  ondansetron (ZOFRAN) 4 MG tablet Take 1 tablet (4 mg total) by mouth every 8 (eight) hours as needed for nausea or vomiting. 11/28/15   Daymon Larsen,  MD  potassium chloride SA (K-DUR,KLOR-CON) 20 MEQ tablet Take 1 tablet by mouth daily. 09/18/13   Historical Provider, MD  ranitidine (ZANTAC) 150 MG capsule Take 150 mg by mouth 2 (two) times daily.    Historical Provider, MD  triamcinolone (KENALOG) [id number] % cream 1 application daily. 09/18/13   Historical Provider, MD    Allergies Morphine and related and Zoloft [sertraline hcl]  Family History  Problem Relation Age of Onset  . Colon cancer Sister     Social History Social History  Substance Use Topics  . Smoking status: Never Smoker  . Smokeless tobacco: Never Used  . Alcohol  use No    Review of Systems Constitutional: Negative for fever. Eyes: Negative for visual changes. ENT: + facial injury. No neck injury Cardiovascular: Negative for chest injury. Respiratory: Negative for shortness of breath. Negative for chest wall injury. Gastrointestinal: Negative for abdominal pain or injury. Genitourinary: Negative for dysuria. Musculoskeletal: Negative for back injury, + left arm deformity and b/l knee swelling Skin: + left LE skin tear Neurological: + head injury.  ____________________________________________   PHYSICAL EXAM:  VITAL SIGNS: Vitals:   08/20/16 2009  BP: 124/66  Pulse: 98  Resp: 16  Temp: 97.2 F (36.2 C)   Constitutional: Alert and oriented. No acute distress. Does not appear intoxicated. HEENT Head: Normocephalic and atraumatic. Face: Severe swelling and bruising to the left side of the face Ears: No hemotympanum bilaterally. No Battle sign Eyes: Significant periorbital swelling and hematoma on the L eye, unable to open left eyelid to examine left eye. No R eye injury with round and reactive R pupil Nose: Nontender. No epistaxis. No rhinorrhea Mouth/Throat: Mucous membranes are moist. No oropharyngeal blood. No dental injury. Airway patent without stridor. Normal voice. Neck: no C-collar in place. No midline c-spine tenderness.  Cardiovascular: Normal rate, regular rhythm. Normal and symmetric distal pulses are present in all extremities. Pulmonary/Chest: Chest wall is stable and nontender to palpation/compression. Normal respiratory effort. Breath sounds are normal. No crepitus.  Abdominal: Soft, nontender, non distended. Musculoskeletal: Obvious deformity on the L proximal humerus. Significant swelling and brusing of b/l knees with full painless ROM. Nontender with normal full range of motion in all other extremities including b/l pelvis.No thoracic or lumbar midline spinal tenderness. Pelvis is stable. Skin: 3cm skin tear on the  LLE Psychiatric: Speech and behavior are appropriate. Neurological: Normal speech and language. Moves all extremities to command. No gross focal neurologic deficits are appreciated.  Glascow Coma Score: 4 - Opens eyes on own 6 - Follows simple motor commands 5 - Alert and oriented GCS: 15  _______________________   LABS (all labs ordered are listed, but only abnormal results are displayed)  Labs Reviewed  CBC  BASIC METABOLIC PANEL  PROTIME-INR   ____________________________________________  EKG  none ____________________________________________  RADIOLOGY  Imaging reviewed by me  ____________________________________________   PROCEDURES  Procedure(s) performed: None Procedures Critical Care performed: yes  CRITICAL CARE Performed by: Rudene Re  ?  Total critical care time: 35 min  Critical care time was exclusive of separately billable procedures and treating other patients.  Critical care was necessary to treat or prevent imminent or life-threatening deterioration.  Critical care was time spent personally by me on the following activities: development of treatment plan with patient and/or surrogate as well as nursing, discussions with consultants, evaluation of patient's response to treatment, examination of patient, obtaining history from patient or surrogate, ordering and performing treatments and interventions, ordering and review of laboratory  studies, ordering and review of radiographic studies, pulse oximetry and re-evaluation of patient's condition.  ____________________________________________   INITIAL IMPRESSION / ASSESSMENT AND PLAN / ED COURSE  82 y.o. female history of CHF, COPD, hyperlipidemia, PVD on Plavix who presents after a mechanical fall with trauma to the face, left humerus and bilateral knees. Plan for CT head, CT cervical spine, CT face, XR left humerus and b/l knees. Will clean and apply bacitracin to skin tear. Will give  tetanus shot. Will give fentanyl for pain.  Clinical Course  Comment By Time  CT head/face/neck showing swelling but no bleeding or fracture. XR left arm showing humerus fracture. Patient in significant amount of pain requiring multiple doses on IV pain meds. Patient also unable to open left eye and now with a broken arm and therefore not safe to ambulate at home with walker. Will contact [facility] for transfer.  Rudene Re, MD 11/02 2146    Pertinent labs & imaging results that were available during my care of the patient were reviewed by me and considered in my medical decision making (see chart for details).    ____________________________________________   FINAL CLINICAL IMPRESSION(S) / ED DIAGNOSES  Final diagnoses:  Fall, initial encounter  Contusion of face, initial encounter  Closed supracondylar fracture of left humerus, initial encounter  Abrasion of knee, bilateral  Noninfected skin tear of left lower extremity, initial encounter      NEW MEDICATIONS STARTED DURING THIS VISIT:  New Prescriptions   No medications on file     Note:  This document was prepared using Dragon voice recognition software and may include unintentional dictation errors.    Rudene Re, MD 08/20/16 2204

## 2016-08-21 DIAGNOSIS — W1839XA Other fall on same level, initial encounter: Secondary | ICD-10-CM | POA: Diagnosis not present

## 2016-08-21 DIAGNOSIS — S0990XA Unspecified injury of head, initial encounter: Secondary | ICD-10-CM | POA: Diagnosis not present

## 2016-08-21 DIAGNOSIS — M1712 Unilateral primary osteoarthritis, left knee: Secondary | ICD-10-CM | POA: Diagnosis not present

## 2016-08-21 DIAGNOSIS — R220 Localized swelling, mass and lump, head: Secondary | ICD-10-CM | POA: Diagnosis not present

## 2016-08-21 DIAGNOSIS — M85861 Other specified disorders of bone density and structure, right lower leg: Secondary | ICD-10-CM | POA: Diagnosis not present

## 2016-08-21 DIAGNOSIS — S42295A Other nondisplaced fracture of upper end of left humerus, initial encounter for closed fracture: Secondary | ICD-10-CM | POA: Diagnosis not present

## 2016-08-21 DIAGNOSIS — S42202A Unspecified fracture of upper end of left humerus, initial encounter for closed fracture: Secondary | ICD-10-CM | POA: Diagnosis not present

## 2016-08-21 DIAGNOSIS — S8992XA Unspecified injury of left lower leg, initial encounter: Secondary | ICD-10-CM | POA: Diagnosis not present

## 2016-08-21 DIAGNOSIS — S42352A Displaced comminuted fracture of shaft of humerus, left arm, initial encounter for closed fracture: Secondary | ICD-10-CM | POA: Diagnosis not present

## 2016-08-21 DIAGNOSIS — S199XXA Unspecified injury of neck, initial encounter: Secondary | ICD-10-CM | POA: Diagnosis not present

## 2016-08-21 DIAGNOSIS — M8580 Other specified disorders of bone density and structure, unspecified site: Secondary | ICD-10-CM | POA: Diagnosis not present

## 2016-08-21 DIAGNOSIS — S8991XA Unspecified injury of right lower leg, initial encounter: Secondary | ICD-10-CM | POA: Diagnosis not present

## 2016-08-21 DIAGNOSIS — S0993XA Unspecified injury of face, initial encounter: Secondary | ICD-10-CM | POA: Diagnosis not present

## 2016-08-21 DIAGNOSIS — M47812 Spondylosis without myelopathy or radiculopathy, cervical region: Secondary | ICD-10-CM | POA: Diagnosis not present

## 2016-08-21 DIAGNOSIS — S42392A Other fracture of shaft of left humerus, initial encounter for closed fracture: Secondary | ICD-10-CM | POA: Diagnosis not present

## 2016-08-21 DIAGNOSIS — M7989 Other specified soft tissue disorders: Secondary | ICD-10-CM | POA: Diagnosis not present

## 2016-08-22 DIAGNOSIS — K219 Gastro-esophageal reflux disease without esophagitis: Secondary | ICD-10-CM | POA: Diagnosis not present

## 2016-08-22 DIAGNOSIS — B9620 Unspecified Escherichia coli [E. coli] as the cause of diseases classified elsewhere: Secondary | ICD-10-CM | POA: Diagnosis not present

## 2016-08-22 DIAGNOSIS — I872 Venous insufficiency (chronic) (peripheral): Secondary | ICD-10-CM | POA: Diagnosis not present

## 2016-08-22 DIAGNOSIS — S0990XA Unspecified injury of head, initial encounter: Secondary | ICD-10-CM | POA: Diagnosis not present

## 2016-08-22 DIAGNOSIS — D696 Thrombocytopenia, unspecified: Secondary | ICD-10-CM | POA: Diagnosis not present

## 2016-08-22 DIAGNOSIS — N189 Chronic kidney disease, unspecified: Secondary | ICD-10-CM | POA: Diagnosis not present

## 2016-08-22 DIAGNOSIS — R5381 Other malaise: Secondary | ICD-10-CM | POA: Diagnosis not present

## 2016-08-22 DIAGNOSIS — S42295A Other nondisplaced fracture of upper end of left humerus, initial encounter for closed fracture: Secondary | ICD-10-CM | POA: Diagnosis not present

## 2016-08-22 DIAGNOSIS — J449 Chronic obstructive pulmonary disease, unspecified: Secondary | ICD-10-CM | POA: Diagnosis not present

## 2016-08-22 DIAGNOSIS — L97819 Non-pressure chronic ulcer of other part of right lower leg with unspecified severity: Secondary | ICD-10-CM | POA: Diagnosis present

## 2016-08-22 DIAGNOSIS — D62 Acute posthemorrhagic anemia: Secondary | ICD-10-CM | POA: Diagnosis not present

## 2016-08-22 DIAGNOSIS — K589 Irritable bowel syndrome without diarrhea: Secondary | ICD-10-CM | POA: Diagnosis not present

## 2016-08-22 DIAGNOSIS — Z6821 Body mass index (BMI) 21.0-21.9, adult: Secondary | ICD-10-CM | POA: Diagnosis not present

## 2016-08-22 DIAGNOSIS — J309 Allergic rhinitis, unspecified: Secondary | ICD-10-CM | POA: Diagnosis not present

## 2016-08-22 DIAGNOSIS — S0512XD Contusion of eyeball and orbital tissues, left eye, subsequent encounter: Secondary | ICD-10-CM | POA: Diagnosis not present

## 2016-08-22 DIAGNOSIS — S0003XA Contusion of scalp, initial encounter: Secondary | ICD-10-CM | POA: Diagnosis not present

## 2016-08-22 DIAGNOSIS — E875 Hyperkalemia: Secondary | ICD-10-CM | POA: Diagnosis not present

## 2016-08-22 DIAGNOSIS — H1132 Conjunctival hemorrhage, left eye: Secondary | ICD-10-CM | POA: Diagnosis present

## 2016-08-22 DIAGNOSIS — E785 Hyperlipidemia, unspecified: Secondary | ICD-10-CM | POA: Diagnosis not present

## 2016-08-22 DIAGNOSIS — R262 Difficulty in walking, not elsewhere classified: Secondary | ICD-10-CM | POA: Diagnosis not present

## 2016-08-22 DIAGNOSIS — I739 Peripheral vascular disease, unspecified: Secondary | ICD-10-CM | POA: Diagnosis not present

## 2016-08-22 DIAGNOSIS — Z87891 Personal history of nicotine dependence: Secondary | ICD-10-CM | POA: Diagnosis not present

## 2016-08-22 DIAGNOSIS — I5042 Chronic combined systolic (congestive) and diastolic (congestive) heart failure: Secondary | ICD-10-CM | POA: Diagnosis not present

## 2016-08-22 DIAGNOSIS — I130 Hypertensive heart and chronic kidney disease with heart failure and stage 1 through stage 4 chronic kidney disease, or unspecified chronic kidney disease: Secondary | ICD-10-CM | POA: Diagnosis not present

## 2016-08-22 DIAGNOSIS — M6281 Muscle weakness (generalized): Secondary | ICD-10-CM | POA: Diagnosis not present

## 2016-08-22 DIAGNOSIS — Z79899 Other long term (current) drug therapy: Secondary | ICD-10-CM | POA: Diagnosis not present

## 2016-08-22 DIAGNOSIS — R9089 Other abnormal findings on diagnostic imaging of central nervous system: Secondary | ICD-10-CM | POA: Diagnosis not present

## 2016-08-22 DIAGNOSIS — I5022 Chronic systolic (congestive) heart failure: Secondary | ICD-10-CM | POA: Diagnosis not present

## 2016-08-22 DIAGNOSIS — G629 Polyneuropathy, unspecified: Secondary | ICD-10-CM | POA: Diagnosis present

## 2016-08-22 DIAGNOSIS — S42209A Unspecified fracture of upper end of unspecified humerus, initial encounter for closed fracture: Secondary | ICD-10-CM | POA: Diagnosis not present

## 2016-08-22 DIAGNOSIS — M80022D Age-related osteoporosis with current pathological fracture, left humerus, subsequent encounter for fracture with routine healing: Secondary | ICD-10-CM | POA: Diagnosis not present

## 2016-08-22 DIAGNOSIS — Z9181 History of falling: Secondary | ICD-10-CM | POA: Diagnosis not present

## 2016-08-22 DIAGNOSIS — Z7902 Long term (current) use of antithrombotics/antiplatelets: Secondary | ICD-10-CM | POA: Diagnosis not present

## 2016-08-22 DIAGNOSIS — N179 Acute kidney failure, unspecified: Secondary | ICD-10-CM | POA: Diagnosis not present

## 2016-08-22 DIAGNOSIS — M810 Age-related osteoporosis without current pathological fracture: Secondary | ICD-10-CM | POA: Diagnosis present

## 2016-08-22 DIAGNOSIS — F419 Anxiety disorder, unspecified: Secondary | ICD-10-CM | POA: Diagnosis not present

## 2016-08-22 DIAGNOSIS — S0512XA Contusion of eyeball and orbital tissues, left eye, initial encounter: Secondary | ICD-10-CM | POA: Diagnosis present

## 2016-08-22 DIAGNOSIS — E869 Volume depletion, unspecified: Secondary | ICD-10-CM | POA: Diagnosis present

## 2016-08-22 DIAGNOSIS — S42302A Unspecified fracture of shaft of humerus, left arm, initial encounter for closed fracture: Secondary | ICD-10-CM | POA: Diagnosis present

## 2016-08-22 DIAGNOSIS — N390 Urinary tract infection, site not specified: Secondary | ICD-10-CM | POA: Diagnosis not present

## 2016-08-22 DIAGNOSIS — R279 Unspecified lack of coordination: Secondary | ICD-10-CM | POA: Diagnosis not present

## 2016-08-22 DIAGNOSIS — R1312 Dysphagia, oropharyngeal phase: Secondary | ICD-10-CM | POA: Diagnosis not present

## 2016-08-22 DIAGNOSIS — I509 Heart failure, unspecified: Secondary | ICD-10-CM | POA: Diagnosis present

## 2016-08-22 DIAGNOSIS — Z6822 Body mass index (BMI) 22.0-22.9, adult: Secondary | ICD-10-CM | POA: Diagnosis not present

## 2016-08-22 DIAGNOSIS — Z7982 Long term (current) use of aspirin: Secondary | ICD-10-CM | POA: Diagnosis not present

## 2016-08-27 ENCOUNTER — Encounter
Admission: RE | Admit: 2016-08-27 | Discharge: 2016-08-27 | Disposition: A | Discharge status: Home / Self Care | Payer: Medicare Other | Source: Ambulatory Visit | Attending: Internal Medicine | Admitting: Internal Medicine

## 2016-08-27 DIAGNOSIS — G629 Polyneuropathy, unspecified: Secondary | ICD-10-CM | POA: Diagnosis not present

## 2016-08-27 DIAGNOSIS — B9620 Unspecified Escherichia coli [E. coli] as the cause of diseases classified elsewhere: Secondary | ICD-10-CM | POA: Diagnosis not present

## 2016-08-27 DIAGNOSIS — Z7902 Long term (current) use of antithrombotics/antiplatelets: Secondary | ICD-10-CM | POA: Diagnosis not present

## 2016-08-27 DIAGNOSIS — M80022A Age-related osteoporosis with current pathological fracture, left humerus, initial encounter for fracture: Secondary | ICD-10-CM | POA: Diagnosis not present

## 2016-08-27 DIAGNOSIS — R531 Weakness: Secondary | ICD-10-CM | POA: Diagnosis not present

## 2016-08-27 DIAGNOSIS — R1312 Dysphagia, oropharyngeal phase: Secondary | ICD-10-CM | POA: Diagnosis not present

## 2016-08-27 DIAGNOSIS — S42302D Unspecified fracture of shaft of humerus, left arm, subsequent encounter for fracture with routine healing: Secondary | ICD-10-CM | POA: Diagnosis not present

## 2016-08-27 DIAGNOSIS — Z87891 Personal history of nicotine dependence: Secondary | ICD-10-CM | POA: Diagnosis not present

## 2016-08-27 DIAGNOSIS — I5022 Chronic systolic (congestive) heart failure: Secondary | ICD-10-CM | POA: Diagnosis not present

## 2016-08-27 DIAGNOSIS — Z9181 History of falling: Secondary | ICD-10-CM | POA: Diagnosis not present

## 2016-08-27 DIAGNOSIS — I509 Heart failure, unspecified: Secondary | ICD-10-CM | POA: Diagnosis not present

## 2016-08-27 DIAGNOSIS — R51 Headache: Secondary | ICD-10-CM | POA: Diagnosis not present

## 2016-08-27 DIAGNOSIS — I110 Hypertensive heart disease with heart failure: Secondary | ICD-10-CM | POA: Diagnosis not present

## 2016-08-27 DIAGNOSIS — I872 Venous insufficiency (chronic) (peripheral): Secondary | ICD-10-CM | POA: Diagnosis not present

## 2016-08-27 DIAGNOSIS — M6281 Muscle weakness (generalized): Secondary | ICD-10-CM | POA: Diagnosis not present

## 2016-08-27 DIAGNOSIS — I739 Peripheral vascular disease, unspecified: Secondary | ICD-10-CM | POA: Diagnosis not present

## 2016-08-27 DIAGNOSIS — Z7401 Bed confinement status: Secondary | ICD-10-CM | POA: Diagnosis not present

## 2016-08-27 DIAGNOSIS — M80022D Age-related osteoporosis with current pathological fracture, left humerus, subsequent encounter for fracture with routine healing: Secondary | ICD-10-CM | POA: Diagnosis not present

## 2016-08-27 DIAGNOSIS — F411 Generalized anxiety disorder: Secondary | ICD-10-CM | POA: Diagnosis not present

## 2016-08-27 DIAGNOSIS — M810 Age-related osteoporosis without current pathological fracture: Secondary | ICD-10-CM | POA: Diagnosis not present

## 2016-08-27 DIAGNOSIS — K589 Irritable bowel syndrome without diarrhea: Secondary | ICD-10-CM | POA: Diagnosis not present

## 2016-08-27 DIAGNOSIS — S0512XD Contusion of eyeball and orbital tissues, left eye, subsequent encounter: Secondary | ICD-10-CM | POA: Diagnosis not present

## 2016-08-27 DIAGNOSIS — S42209A Unspecified fracture of upper end of unspecified humerus, initial encounter for closed fracture: Secondary | ICD-10-CM | POA: Diagnosis not present

## 2016-08-27 DIAGNOSIS — S42462A Displaced fracture of medial condyle of left humerus, initial encounter for closed fracture: Secondary | ICD-10-CM | POA: Diagnosis not present

## 2016-08-27 DIAGNOSIS — D62 Acute posthemorrhagic anemia: Secondary | ICD-10-CM | POA: Diagnosis not present

## 2016-08-27 DIAGNOSIS — Z7982 Long term (current) use of aspirin: Secondary | ICD-10-CM | POA: Diagnosis not present

## 2016-08-27 DIAGNOSIS — Z4789 Encounter for other orthopedic aftercare: Secondary | ICD-10-CM | POA: Diagnosis not present

## 2016-08-27 DIAGNOSIS — N390 Urinary tract infection, site not specified: Secondary | ICD-10-CM | POA: Diagnosis not present

## 2016-08-27 DIAGNOSIS — Z79899 Other long term (current) drug therapy: Secondary | ICD-10-CM | POA: Diagnosis not present

## 2016-08-27 DIAGNOSIS — G8911 Acute pain due to trauma: Secondary | ICD-10-CM | POA: Diagnosis not present

## 2016-08-27 DIAGNOSIS — J449 Chronic obstructive pulmonary disease, unspecified: Secondary | ICD-10-CM | POA: Diagnosis not present

## 2016-08-27 DIAGNOSIS — S42295A Other nondisplaced fracture of upper end of left humerus, initial encounter for closed fracture: Secondary | ICD-10-CM | POA: Diagnosis not present

## 2016-08-27 DIAGNOSIS — K219 Gastro-esophageal reflux disease without esophagitis: Secondary | ICD-10-CM | POA: Diagnosis not present

## 2016-08-27 DIAGNOSIS — S59902D Unspecified injury of left elbow, subsequent encounter: Secondary | ICD-10-CM | POA: Diagnosis not present

## 2016-08-27 DIAGNOSIS — F419 Anxiety disorder, unspecified: Secondary | ICD-10-CM | POA: Diagnosis not present

## 2016-08-27 DIAGNOSIS — R262 Difficulty in walking, not elsewhere classified: Secondary | ICD-10-CM | POA: Diagnosis not present

## 2016-08-27 DIAGNOSIS — I130 Hypertensive heart and chronic kidney disease with heart failure and stage 1 through stage 4 chronic kidney disease, or unspecified chronic kidney disease: Secondary | ICD-10-CM | POA: Diagnosis not present

## 2016-08-27 DIAGNOSIS — R6889 Other general symptoms and signs: Secondary | ICD-10-CM | POA: Diagnosis not present

## 2016-08-27 DIAGNOSIS — E785 Hyperlipidemia, unspecified: Secondary | ICD-10-CM | POA: Diagnosis not present

## 2016-08-27 DIAGNOSIS — D649 Anemia, unspecified: Secondary | ICD-10-CM | POA: Diagnosis not present

## 2016-08-27 DIAGNOSIS — I5042 Chronic combined systolic (congestive) and diastolic (congestive) heart failure: Secondary | ICD-10-CM | POA: Diagnosis not present

## 2016-08-27 DIAGNOSIS — J309 Allergic rhinitis, unspecified: Secondary | ICD-10-CM | POA: Diagnosis not present

## 2016-08-27 DIAGNOSIS — I10 Essential (primary) hypertension: Secondary | ICD-10-CM | POA: Diagnosis not present

## 2016-08-27 DIAGNOSIS — Z6822 Body mass index (BMI) 22.0-22.9, adult: Secondary | ICD-10-CM | POA: Diagnosis not present

## 2016-08-27 DIAGNOSIS — N189 Chronic kidney disease, unspecified: Secondary | ICD-10-CM | POA: Diagnosis not present

## 2016-08-27 DIAGNOSIS — R5381 Other malaise: Secondary | ICD-10-CM | POA: Diagnosis not present

## 2016-08-27 DIAGNOSIS — R279 Unspecified lack of coordination: Secondary | ICD-10-CM | POA: Diagnosis not present

## 2016-08-28 DIAGNOSIS — I739 Peripheral vascular disease, unspecified: Secondary | ICD-10-CM | POA: Diagnosis not present

## 2016-08-28 DIAGNOSIS — M810 Age-related osteoporosis without current pathological fracture: Secondary | ICD-10-CM | POA: Diagnosis not present

## 2016-08-28 DIAGNOSIS — D649 Anemia, unspecified: Secondary | ICD-10-CM | POA: Diagnosis not present

## 2016-08-28 DIAGNOSIS — I509 Heart failure, unspecified: Secondary | ICD-10-CM | POA: Diagnosis not present

## 2016-09-08 ENCOUNTER — Non-Acute Institutional Stay (SKILLED_NURSING_FACILITY): Payer: Medicare Other | Admitting: Gerontology

## 2016-09-08 DIAGNOSIS — M80022D Age-related osteoporosis with current pathological fracture, left humerus, subsequent encounter for fracture with routine healing: Secondary | ICD-10-CM

## 2016-09-08 DIAGNOSIS — G8911 Acute pain due to trauma: Secondary | ICD-10-CM

## 2016-09-15 ENCOUNTER — Non-Acute Institutional Stay (SKILLED_NURSING_FACILITY): Payer: Medicare Other | Admitting: Gerontology

## 2016-09-15 DIAGNOSIS — M80022D Age-related osteoporosis with current pathological fracture, left humerus, subsequent encounter for fracture with routine healing: Secondary | ICD-10-CM | POA: Diagnosis not present

## 2016-09-15 DIAGNOSIS — G8911 Acute pain due to trauma: Secondary | ICD-10-CM | POA: Diagnosis not present

## 2016-09-15 DIAGNOSIS — F411 Generalized anxiety disorder: Secondary | ICD-10-CM

## 2016-09-17 NOTE — Progress Notes (Signed)
Location:      Place of Service:  SNF (31) Provider:  Toni Arthurs, NP-C  MASOUD,JAVED, MD  Patient Care Team: Cletis Athens, MD as PCP - General (Internal Medicine) Seeplaputhur Robinette Haines, MD (General Surgery) Cletis Athens, MD (Internal Medicine)  Extended Emergency Contact Information Primary Emergency Contact: Harden Mo of [place] Phone: [phone] Relation: Daughter Secondary Emergency Contact: Elliot Gurney [place] of [place] Mobile Phone: [phone] Relation: Other  Code Status:  dnr Goals of care: Advanced Directive information Advanced Directives 08/20/2016  Does Patient Have a Medical Advance Directive? No  Type of Advance Directive -  Copy of [place] in Chart? -     Chief Complaint  Patient presents with  . Acute Visit    HPI:  Pt is a 82 y.o. female seen today for an acute visit for evaluation of pain. Pt sustained a pathological fracture of the left humerus after a fall. She also sustained a Left orbit and eyeball contusion. This does not seem to be bothering her. However, she is complaining of worsening pain in the left upper arm, worse at night, that is affecting her sleep. She is stoic and "does not want to complain". She rated her pain as an 8/10 at worst. Her fingers and lower arm is edematous. Pt reports she doe not like to take pain medications but is agreeable to scheduled tylenol with prn tramadol. She initially did not want an ice pack d/t fear the ice would make the pain worse. She wanted heat, but was accepting of the ice after I explained why heat was not an option. Other that the pain, she had no other complaints. She was very pleasant and thanked me for helping her better manage her pain. VSS   Past Medical History:  Diagnosis Date  . Anemia   . CHF (congestive heart failure) ([place])   . Emphysema of lung ([place])   . Hyperlipidemia   . Neuropathy ([place])   . Osteoporosis    Past Surgical  History:  Procedure Laterality Date  . ABDOMINAL HYSTERECTOMY  1982  . CHOLECYSTECTOMY  1992  . JOINT REPLACEMENT  2005    Allergies  Allergen Reactions  . Morphine And Related     Heart problems   . Zoloft [Sertraline Hcl] Swelling      Medication List       Accurate as of 09/08/16 11:59 PM. Always use your most recent med list.          ALPRAZolam 0.25 MG tablet Commonly known as:  XANAX Take 1 tablet by mouth daily.   aspirin 81 MG tablet Take 81 mg by mouth daily.   calcium carbonate 200 MG capsule Take 250 mg by mouth 2 (two) times daily with a meal.   cholecalciferol 400 units Tabs tablet Commonly known as:  VITAMIN D Take 400 Units by mouth.   clopidogrel 75 MG tablet Commonly known as:  PLAVIX Take 1 tablet by mouth daily.   cyanocobalamin 1000 MCG tablet Take 100 mcg by mouth daily.   dicyclomine 20 MG tablet Commonly known as:  BENTYL Take 1 tablet (20 mg total) by mouth 3 (three) times daily as needed for spasms.   FLECTOR 1.3 % Ptch Generic drug:  diclofenac 1 patch daily.   furosemide 20 MG tablet Commonly known as:  LASIX Take 1 tablet by mouth daily.   gabapentin 300 MG capsule Commonly known as:  NEURONTIN Take 1 capsule by mouth daily.   iron  polysaccharides 150 MG capsule Commonly known as:  NIFEREX Take 150 mg by mouth daily.   loratadine 10 MG tablet Commonly known as:  CLARITIN Take 10 mg by mouth daily.   magnesium 30 MG tablet Take 30 mg by mouth 2 (two) times daily.   metoprolol tartrate 25 MG tablet Commonly known as:  LOPRESSOR Take 1 tablet by mouth daily.   multivitamin with minerals tablet Take 1 tablet by mouth daily.   nitroGLYCERIN 0.2 mg/hr patch Commonly known as:  NITRODUR - Dosed in mg/24 hr 1 patch daily.   ondansetron 4 MG tablet Commonly known as:  ZOFRAN Take 1 tablet (4 mg total) by mouth every 8 (eight) hours as needed for nausea or vomiting.   potassium chloride SA 20 MEQ tablet Commonly  known as:  K-DUR,KLOR-CON Take 1 tablet by mouth daily.   ranitidine 150 MG capsule Commonly known as:  ZANTAC Take 150 mg by mouth 2 (two) times daily.   triamcinolone 0.025 % cream Commonly known as:  KENALOG 1 application daily.       Review of Systems  Constitutional: Negative for activity change, appetite change, chills, diaphoresis and fever.  HENT: Negative for congestion, sneezing, sore throat, trouble swallowing and voice change.   Eyes: Negative.   Respiratory: Negative for apnea, cough, choking, chest tightness, shortness of breath and wheezing.   Cardiovascular: Negative for chest pain, palpitations and leg swelling.  Gastrointestinal: Negative.   Genitourinary: Negative.   Musculoskeletal: Positive for arthralgias (typical arthritis), joint swelling and myalgias. Negative for back pain and gait problem.  Skin: Negative for color change, pallor, rash and wound.  Neurological: Positive for weakness. Negative for dizziness, tremors, syncope, speech difficulty, numbness and headaches.  Psychiatric/Behavioral: Negative.   All other systems reviewed and are negative.   Immunization History  Administered Date(s) Administered  . Tdap 08/20/2016   Pertinent  Health Maintenance Due  Topic Date Due  . DEXA SCAN  01/14/1990  . PNA vac Low Risk Adult (1 of 2 - PCV13) 01/14/1990  . INFLUENZA VACCINE  05/19/2016   No flowsheet data found. Functional Status Survey:    Vitals:   09/08/16 0430  BP: (!) 141/53  Pulse: 82  Resp: 20  Temp: 98 F (36.7 C)  SpO2: 94%  Weight: 141 lb 6.4 oz (64.1 kg)   Body mass index is 22.82 kg/m. Physical Exam  Constitutional: She is oriented to person, place, and time. Vital signs are normal. She appears well-developed and well-nourished. She is active and cooperative. She does not appear ill. No distress.  HENT:  Head: Normocephalic and atraumatic.  Mouth/Throat: Uvula is midline, oropharynx is clear and moist and mucous membranes  are normal. Mucous membranes are not pale, not dry and not cyanotic.  Eyes: Conjunctivae, EOM and lids are normal. Pupils are equal, round, and reactive to light.  Neck: Trachea normal, normal range of motion and full passive range of motion without pain. Neck supple. No JVD present. No tracheal deviation, no edema and no erythema present. No thyromegaly present.  Cardiovascular: Normal rate, regular rhythm, normal heart sounds, intact distal pulses and normal pulses.  Exam reveals no gallop, no distant heart sounds and no friction rub.   No murmur heard. Pulmonary/Chest: Effort normal and breath sounds normal. No accessory muscle usage. No respiratory distress. She has no decreased breath sounds. She has no wheezes. She has no rhonchi. She has no rales. She exhibits no tenderness.  Abdominal: Normal appearance and bowel sounds are normal. She  exhibits no distension and no ascites. There is no tenderness.  Musculoskeletal: She exhibits no edema.       Left elbow: She exhibits decreased range of motion and swelling. Tenderness found.  Expected osteoarthritis, stiffness  Neurological: She is alert and oriented to person, place, and time. She has normal strength.  Skin: Skin is warm, dry and intact. Bruising and ecchymosis noted. She is not diaphoretic. No cyanosis. No pallor. Nails show no clubbing.     Psychiatric: She has a normal mood and affect. Her speech is normal and behavior is normal. Judgment and thought content normal. Cognition and memory are normal.  Nursing note and vitals reviewed.   Labs reviewed:  Recent Labs  11/28/15 1742 08/20/16 2038  NA 138 139  K 5.2* 4.8  CL 101 101  CO2 28 29  GLUCOSE 140* 113*  BUN 40* 39*  CREATININE 1.68* 1.65*  CALCIUM 10.1 9.7    Recent Labs  11/28/15 1742  AST 24  ALT 17  ALKPHOS 96  BILITOT 0.6  PROT 7.1  ALBUMIN 4.0    Recent Labs  11/28/15 1742 08/20/16 2038  WBC 9.2 10.0  HGB 12.4 11.1*  HCT 36.8 32.4*  MCV 99.1  94.8  PLT 158 133*   No results found for: TSH No results found for: HGBA1C No results found for: CHOL, HDL, LDLCALC, LDLDIRECT, TRIG, CHOLHDL  Significant Diagnostic Results in last 30 days:  Ct Head Wo Contrast  Result Date: 08/20/2016 CLINICAL DATA:  Initial evaluation for acute trauma, fall. EXAM: CT HEAD WITHOUT CONTRAST CT MAXILLOFACIAL WITHOUT CONTRAST CT CERVICAL SPINE WITHOUT CONTRAST TECHNIQUE: Multidetector CT imaging of the head, cervical spine, and maxillofacial structures were performed using the standard protocol without intravenous contrast. Multiplanar CT image reconstructions of the cervical spine and maxillofacial structures were also generated. COMPARISON:  Prior study from 08/04/2012. FINDINGS: CT HEAD FINDINGS Brain: Generalized cerebral atrophy with chronic microvascular ischemic disease. No acute intracranial hemorrhage. No evidence for acute large vessel territory infarct. No mass lesion, midline shift or mass effect. No hydrocephalus. No extra-axial fluid collection. Vascular: No hyperdense vessel. Scattered vascular calcifications noted within the carotid siphons. Skull: Soft tissue contusion at the left temporal and periorbital region. Additional soft tissue contusion near the left vertex. Scalp soft tissues otherwise within normal limits. Visualized calvarium intact. CT MAXILLOFACIAL FINDINGS Osseous: Zygomatic arches intact. No acute maxillary fracture. Linear lucency extending through the left pterygoid plate favored to be chronic in nature. No acute fracture about the pterygoid plates. Nasal bones intact. Nasal septum bowed to the right but intact. Mandible intact. Mandibular condyles normally situated. No acute abnormality about the dentition. Orbits: Globes intact. No retro-orbital hematoma or other pathology. Bony orbits intact without evidence orbital floor fracture. Sinuses: Mild scattered mucosal thickening within the left maxillary sinus. Paranasal sinuses are  otherwise clear. Mild scattered opacity within the mastoid air cells bilaterally, likely chronic. Soft tissues: Extensive soft tissue contusion within the left periorbital region, extending into the left face. CT CERVICAL SPINE FINDINGS Alignment: Vertebral bodies are normally aligned with preservation of the normal cervical lordosis. No listhesis. Skull base and vertebrae: Skullbase intact. Normal C1-2 articulations are preserved. Dens is intact. Mild chronic height loss at the superior endplate of T1. Vertebral body heights otherwise maintained. No acute fracture. Soft tissues and spinal canal: The soft tissues of the neck demonstrate no acute abnormality. No prevertebral soft tissue swelling. Scattered vascular calcifications noted about the carotid bifurcations. Disc levels: Scattered relatively mild multilevel degenerative spondylolysis,  greatest at C5-6 and C6-7. Upper chest: Visualized upper chest without acute abnormality. Irregular pleural thickening noted at the right lung apex. IMPRESSION: CT HEAD: 1. No acute intracranial process identified. 2. Soft tissue contusion at the left temporal scalp and left vertex. 3. Mild age-related cerebral atrophy with chronic microvascular ischemic disease. CT MAXILLOFACIAL: 1. Prominent left periorbital and facial use soft tissue contusion. 2. No acute maxillofacial fracture identified. Intact globes with no retro-orbital pathology. CT CERVICAL SPINE: No acute traumatic injury within the cervical spine. Electronically Signed   By: Jeannine Boga M.D.   On: 08/20/2016 21:06   Ct Cervical Spine Wo Contrast  Result Date: 08/20/2016 CLINICAL DATA:  Initial evaluation for acute trauma, fall. EXAM: CT HEAD WITHOUT CONTRAST CT MAXILLOFACIAL WITHOUT CONTRAST CT CERVICAL SPINE WITHOUT CONTRAST TECHNIQUE: Multidetector CT imaging of the head, cervical spine, and maxillofacial structures were performed using the standard protocol without intravenous contrast. Multiplanar  CT image reconstructions of the cervical spine and maxillofacial structures were also generated. COMPARISON:  Prior study from 08/04/2012. FINDINGS: CT HEAD FINDINGS Brain: Generalized cerebral atrophy with chronic microvascular ischemic disease. No acute intracranial hemorrhage. No evidence for acute large vessel territory infarct. No mass lesion, midline shift or mass effect. No hydrocephalus. No extra-axial fluid collection. Vascular: No hyperdense vessel. Scattered vascular calcifications noted within the carotid siphons. Skull: Soft tissue contusion at the left temporal and periorbital region. Additional soft tissue contusion near the left vertex. Scalp soft tissues otherwise within normal limits. Visualized calvarium intact. CT MAXILLOFACIAL FINDINGS Osseous: Zygomatic arches intact. No acute maxillary fracture. Linear lucency extending through the left pterygoid plate favored to be chronic in nature. No acute fracture about the pterygoid plates. Nasal bones intact. Nasal septum bowed to the right but intact. Mandible intact. Mandibular condyles normally situated. No acute abnormality about the dentition. Orbits: Globes intact. No retro-orbital hematoma or other pathology. Bony orbits intact without evidence orbital floor fracture. Sinuses: Mild scattered mucosal thickening within the left maxillary sinus. Paranasal sinuses are otherwise clear. Mild scattered opacity within the mastoid air cells bilaterally, likely chronic. Soft tissues: Extensive soft tissue contusion within the left periorbital region, extending into the left face. CT CERVICAL SPINE FINDINGS Alignment: Vertebral bodies are normally aligned with preservation of the normal cervical lordosis. No listhesis. Skull base and vertebrae: Skullbase intact. Normal C1-2 articulations are preserved. Dens is intact. Mild chronic height loss at the superior endplate of T1. Vertebral body heights otherwise maintained. No acute fracture. Soft tissues and  spinal canal: The soft tissues of the neck demonstrate no acute abnormality. No prevertebral soft tissue swelling. Scattered vascular calcifications noted about the carotid bifurcations. Disc levels: Scattered relatively mild multilevel degenerative spondylolysis, greatest at C5-6 and C6-7. Upper chest: Visualized upper chest without acute abnormality. Irregular pleural thickening noted at the right lung apex. IMPRESSION: CT HEAD: 1. No acute intracranial process identified. 2. Soft tissue contusion at the left temporal scalp and left vertex. 3. Mild age-related cerebral atrophy with chronic microvascular ischemic disease. CT MAXILLOFACIAL: 1. Prominent left periorbital and facial use soft tissue contusion. 2. No acute maxillofacial fracture identified. Intact globes with no retro-orbital pathology. CT CERVICAL SPINE: No acute traumatic injury within the cervical spine. Electronically Signed   By: Jeannine Boga M.D.   On: 08/20/2016 21:06   Dg Knee Complete 4 Views Left  Result Date: 08/20/2016 CLINICAL DATA:  Fall, head and facial trauma. EXAM: LEFT KNEE - COMPLETE 4+ VIEW COMPARISON:  LEFT knee radiograph January 26, 2011 FINDINGS: No evidence  of fracture, dislocation, or joint effusion. Faint intra-articular calcification most compatible with CPPD. No evidence of arthropathy or other focal bone abnormality. Mild vascular calcifications. Mild infrapatellar soft tissue swelling without subcutaneous gas or radiopaque foreign bodies. IMPRESSION: Soft tissue swelling, no acute osseous process. Electronically Signed   By: Elon Alas M.D.   On: 08/20/2016 21:26   Dg Knee Complete 4 Views Right  Result Date: 08/20/2016 CLINICAL DATA:  Fall, head and facial trauma. EXAM: RIGHT KNEE - COMPLETE 4+ VIEW COMPARISON:  None. FINDINGS: No acute fracture deformity or dislocation. Faint intra-articular calcifications suggest CPPD. Joint space intact without erosions. Severe patellofemoral compartment narrowing  with periarticular sclerosis and marginal spurring. Osteopenia. No destructive bony lesions. Soft tissue planes are not suspicious. Mild vascular calcifications. IMPRESSION: No acute fracture deformity or dislocation. Severe patellofemoral compartment osteoarthrosis. Electronically Signed   By: Elon Alas M.D.   On: 08/20/2016 21:27   Dg Humerus Left  Result Date: 08/20/2016 CLINICAL DATA:  Fall, head and facial trauma. EXAM: LEFT HUMERUS - 2+ VIEW COMPARISON:  None. FINDINGS: Comminuted proximal image fracture with anterolateral angulation of the distal bony fragments. No intra-articular extension. No dislocation. Osteopenia. Soft tissue planes are nonsuspicious. IMPRESSION: Acute displaced LEFT humerus fracture.  No dislocation. Electronically Signed   By: Elon Alas M.D.   On: 08/20/2016 21:25   Ct Maxillofacial Wo Contrast  Result Date: 08/20/2016 CLINICAL DATA:  Initial evaluation for acute trauma, fall. EXAM: CT HEAD WITHOUT CONTRAST CT MAXILLOFACIAL WITHOUT CONTRAST CT CERVICAL SPINE WITHOUT CONTRAST TECHNIQUE: Multidetector CT imaging of the head, cervical spine, and maxillofacial structures were performed using the standard protocol without intravenous contrast. Multiplanar CT image reconstructions of the cervical spine and maxillofacial structures were also generated. COMPARISON:  Prior study from 08/04/2012. FINDINGS: CT HEAD FINDINGS Brain: Generalized cerebral atrophy with chronic microvascular ischemic disease. No acute intracranial hemorrhage. No evidence for acute large vessel territory infarct. No mass lesion, midline shift or mass effect. No hydrocephalus. No extra-axial fluid collection. Vascular: No hyperdense vessel. Scattered vascular calcifications noted within the carotid siphons. Skull: Soft tissue contusion at the left temporal and periorbital region. Additional soft tissue contusion near the left vertex. Scalp soft tissues otherwise within normal limits. Visualized  calvarium intact. CT MAXILLOFACIAL FINDINGS Osseous: Zygomatic arches intact. No acute maxillary fracture. Linear lucency extending through the left pterygoid plate favored to be chronic in nature. No acute fracture about the pterygoid plates. Nasal bones intact. Nasal septum bowed to the right but intact. Mandible intact. Mandibular condyles normally situated. No acute abnormality about the dentition. Orbits: Globes intact. No retro-orbital hematoma or other pathology. Bony orbits intact without evidence orbital floor fracture. Sinuses: Mild scattered mucosal thickening within the left maxillary sinus. Paranasal sinuses are otherwise clear. Mild scattered opacity within the mastoid air cells bilaterally, likely chronic. Soft tissues: Extensive soft tissue contusion within the left periorbital region, extending into the left face. CT CERVICAL SPINE FINDINGS Alignment: Vertebral bodies are normally aligned with preservation of the normal cervical lordosis. No listhesis. Skull base and vertebrae: Skullbase intact. Normal C1-2 articulations are preserved. Dens is intact. Mild chronic height loss at the superior endplate of T1. Vertebral body heights otherwise maintained. No acute fracture. Soft tissues and spinal canal: The soft tissues of the neck demonstrate no acute abnormality. No prevertebral soft tissue swelling. Scattered vascular calcifications noted about the carotid bifurcations. Disc levels: Scattered relatively mild multilevel degenerative spondylolysis, greatest at C5-6 and C6-7. Upper chest: Visualized upper chest without acute abnormality. Irregular pleural  thickening noted at the right lung apex. IMPRESSION: CT HEAD: 1. No acute intracranial process identified. 2. Soft tissue contusion at the left temporal scalp and left vertex. 3. Mild age-related cerebral atrophy with chronic microvascular ischemic disease. CT MAXILLOFACIAL: 1. Prominent left periorbital and facial use soft tissue contusion. 2. No  acute maxillofacial fracture identified. Intact globes with no retro-orbital pathology. CT CERVICAL SPINE: No acute traumatic injury within the cervical spine. Electronically Signed   By: Jeannine Boga M.D.   On: 08/20/2016 21:06    Assessment/Plan 1. Pain, acute due to trauma  Tramadol 50 mg 1 tablets PO Q 4 hours prn pain; #120, no refills  Continue complementary therapies including:  PT/OT  Restorative Nursing  Ice pack to site QID and prn  Diversional activities  Repositioning Q2 hours and prn  Scheduled Tylenol 650 mg po QID  2. Age-related osteoporosis with current pathological fracture of left humerus with routine healing  Monitor for safety  Fall risk  Assist as needed with adls, meals    Family/ staff Communication:   Total Time:  Documentation:  Face to Face:  Family/Phone:   Labs/tests ordered:    Medication list reviewed and assessed for continued appropriateness.  Vikki Ports, NP-C Geriatrics [facility] [phone], [place] [address] Cell Phone (Mon-Fri 8am-5pm):  [phone] On Call:  [phone] & follow prompts after 5pm & weekends Office Phone:  [phone] Office Fax:  [phone]

## 2016-09-18 ENCOUNTER — Encounter
Admission: RE | Admit: 2016-09-18 | Discharge: 2016-09-18 | Disposition: A | Discharge status: Home / Self Care | Payer: Medicare Other | Source: Ambulatory Visit | Attending: Internal Medicine | Admitting: Internal Medicine

## 2016-09-23 DIAGNOSIS — M80022A Age-related osteoporosis with current pathological fracture, left humerus, initial encounter for fracture: Secondary | ICD-10-CM | POA: Diagnosis not present

## 2016-09-23 DIAGNOSIS — S42462A Displaced fracture of medial condyle of left humerus, initial encounter for closed fracture: Secondary | ICD-10-CM | POA: Diagnosis not present

## 2016-09-24 LAB — URINALYSIS, COMPLETE (UACMP) WITH MICROSCOPIC
Bacteria, UA: NONE SEEN
Bilirubin Urine: NEGATIVE
GLUCOSE, UA: NEGATIVE mg/dL
HGB URINE DIPSTICK: NEGATIVE
Ketones, ur: NEGATIVE mg/dL
Nitrite: NEGATIVE
PROTEIN: 30 mg/dL — AB
SPECIFIC GRAVITY, URINE: 1.009 (ref 1.005–1.030)
pH: 7 (ref 5.0–8.0)

## 2016-09-25 ENCOUNTER — Non-Acute Institutional Stay (SKILLED_NURSING_FACILITY): Payer: Medicare Other | Admitting: Gerontology

## 2016-09-25 DIAGNOSIS — G8911 Acute pain due to trauma: Secondary | ICD-10-CM

## 2016-09-25 DIAGNOSIS — F411 Generalized anxiety disorder: Secondary | ICD-10-CM | POA: Diagnosis not present

## 2016-09-25 DIAGNOSIS — M80022D Age-related osteoporosis with current pathological fracture, left humerus, subsequent encounter for fracture with routine healing: Secondary | ICD-10-CM

## 2016-09-26 DIAGNOSIS — F411 Generalized anxiety disorder: Secondary | ICD-10-CM | POA: Insufficient documentation

## 2016-09-26 NOTE — Progress Notes (Signed)
Location:      Place of Service:  SNF (31) Provider:  Toni Arthurs, NP-C  MASOUD,JAVED, MD  Patient Care Team: Cletis Athens, MD as PCP - General (Internal Medicine) Seeplaputhur Robinette Haines, MD (General Surgery) Cletis Athens, MD (Internal Medicine)  Extended Emergency Contact Information Primary Emergency Contact: Harden Mo of [place] Phone: [phone] Relation: Daughter Secondary Emergency Contact: Elliot Gurney States of [place] Mobile Phone: [phone] Relation: Other  Code Status:  DO NOT RESUSCITATE Goals of care: Advanced Directive information Advanced Directives 08/20/2016  Does Patient Have a Medical Advance Directive? No  Type of Advance Directive -  Copy of [place] in Chart? -     Chief Complaint  Patient presents with  . Acute Visit    HPI:  Pt is a 82 y.o. female seen today for an acute visit for Acute onset hypertension. Blood pressures have been stable until this point. Patient began having elevated pressures, especially when working with physical therapy. Patient has a history of anxiety and hypertension. Patient does endorse increased anxiety with therapy, concerns of falling. Patient has a tendency to minimize the effects when relating this to staff. Patient agreeable to having scheduled anti-anxiety medications. Pt reports pain is more controlled. Edema has decreased. Ecchymosis is dissipating. No falls. No new fractures. Patient continues to work with physical therapy. Vital signs stable. No other complaints.    Past Medical History:  Diagnosis Date  . Anemia   . CHF (congestive heart failure) ([place])   . Emphysema of lung ([place])   . Hyperlipidemia   . Neuropathy ([place])   . Osteoporosis    Past Surgical History:  Procedure Laterality Date  . ABDOMINAL HYSTERECTOMY  1982  . CHOLECYSTECTOMY  1992  . JOINT REPLACEMENT  2005    Allergies  Allergen Reactions  . Morphine And Related     Heart  problems   . Zoloft [Sertraline Hcl] Swelling      Medication List       Accurate as of 09/15/16 11:59 PM. Always use your most recent med list.          ALPRAZolam 0.25 MG tablet Commonly known as:  XANAX Take 1 tablet by mouth daily.   aspirin 81 MG tablet Take 81 mg by mouth daily.   calcium carbonate 200 MG capsule Take 250 mg by mouth 2 (two) times daily with a meal.   cholecalciferol 400 units Tabs tablet Commonly known as:  VITAMIN D Take 400 Units by mouth.   clopidogrel 75 MG tablet Commonly known as:  PLAVIX Take 1 tablet by mouth daily.   cyanocobalamin 1000 MCG tablet Take 100 mcg by mouth daily.   dicyclomine 20 MG tablet Commonly known as:  BENTYL Take 1 tablet (20 mg total) by mouth 3 (three) times daily as needed for spasms.   FLECTOR 1.3 % Ptch Generic drug:  diclofenac 1 patch daily.   furosemide 20 MG tablet Commonly known as:  LASIX Take 1 tablet by mouth daily.   gabapentin 300 MG capsule Commonly known as:  NEURONTIN Take 1 capsule by mouth daily.   iron polysaccharides 150 MG capsule Commonly known as:  NIFEREX Take 150 mg by mouth daily.   loratadine 10 MG tablet Commonly known as:  CLARITIN Take 10 mg by mouth daily.   magnesium 30 MG tablet Take 30 mg by mouth 2 (two) times daily.   metoprolol tartrate 25 MG tablet Commonly known as:  LOPRESSOR Take 1 tablet  by mouth daily.   multivitamin with minerals tablet Take 1 tablet by mouth daily.   nitroGLYCERIN 0.2 mg/hr patch Commonly known as:  NITRODUR - Dosed in mg/24 hr 1 patch daily.   ondansetron 4 MG tablet Commonly known as:  ZOFRAN Take 1 tablet (4 mg total) by mouth every 8 (eight) hours as needed for nausea or vomiting.   potassium chloride SA 20 MEQ tablet Commonly known as:  K-DUR,KLOR-CON Take 1 tablet by mouth daily.   ranitidine 150 MG capsule Commonly known as:  ZANTAC Take 150 mg by mouth 2 (two) times daily.   triamcinolone 0.025 %  cream Commonly known as:  KENALOG 1 application daily.       Review of Systems  Constitutional: Negative for activity change, appetite change, chills, diaphoresis and fever.  HENT: Negative for congestion, sneezing, sore throat, trouble swallowing and voice change.   Eyes: Negative.   Respiratory: Negative for apnea, cough, choking, chest tightness, shortness of breath and wheezing.   Cardiovascular: Negative for chest pain, palpitations and leg swelling.  Gastrointestinal: Negative.   Genitourinary: Negative.   Musculoskeletal: Positive for arthralgias (typical arthritis), joint swelling and myalgias. Negative for back pain and gait problem.  Skin: Negative for color change, pallor, rash and wound.  Neurological: Positive for weakness. Negative for dizziness, tremors, syncope, speech difficulty, numbness and headaches.  Psychiatric/Behavioral: Negative.   All other systems reviewed and are negative.   Immunization History  Administered Date(s) Administered  . Tdap 08/20/2016   Pertinent  Health Maintenance Due  Topic Date Due  . DEXA SCAN  01/14/1990  . PNA vac Low Risk Adult (1 of 2 - PCV13) 01/14/1990  . INFLUENZA VACCINE  05/19/2016   No flowsheet data found. Functional Status Survey:    Vitals:   09/15/16 0630 09/15/16 1030  BP: (!) 185/69 (!) 207/83  Pulse: 77   Resp: 18   Temp: 97.9 F (36.6 C)   SpO2: 98%    There is no height or weight on file to calculate BMI. Physical Exam  Constitutional: She is oriented to person, place, and time. Vital signs are normal. She appears well-developed and well-nourished. She is active and cooperative. She does not appear ill. No distress.  HENT:  Head: Normocephalic and atraumatic.  Mouth/Throat: Uvula is midline, oropharynx is clear and moist and mucous membranes are normal. Mucous membranes are not pale, not dry and not cyanotic.  Eyes: Conjunctivae, EOM and lids are normal. Pupils are equal, round, and reactive to light.   Neck: Trachea normal, normal range of motion and full passive range of motion without pain. Neck supple. No JVD present. No tracheal deviation, no edema and no erythema present. No thyromegaly present.  Cardiovascular: Normal rate, regular rhythm, normal heart sounds, intact distal pulses and normal pulses.  Exam reveals no gallop, no distant heart sounds and no friction rub.   No murmur heard. Pulmonary/Chest: Effort normal and breath sounds normal. No accessory muscle usage. No respiratory distress. She has no decreased breath sounds. She has no wheezes. She has no rhonchi. She has no rales. She exhibits no tenderness.  Abdominal: Normal appearance and bowel sounds are normal. She exhibits no distension and no ascites. There is no tenderness.  Musculoskeletal: She exhibits no edema.       Left elbow: She exhibits decreased range of motion and swelling. Tenderness found.  Expected osteoarthritis, stiffness  Neurological: She is alert and oriented to person, place, and time. She has normal strength.  Skin: Skin  is warm, dry and intact. Bruising and ecchymosis noted. She is not diaphoretic. No cyanosis. No pallor. Nails show no clubbing.     Psychiatric: Her speech is normal and behavior is normal. Judgment and thought content normal. Her mood appears anxious. Cognition and memory are normal.  Nursing note and vitals reviewed.   Labs reviewed:  Recent Labs  11/28/15 1742 08/20/16 2038  NA 138 139  K 5.2* 4.8  CL 101 101  CO2 28 29  GLUCOSE 140* 113*  BUN 40* 39*  CREATININE 1.68* 1.65*  CALCIUM 10.1 9.7    Recent Labs  11/28/15 1742  AST 24  ALT 17  ALKPHOS 96  BILITOT 0.6  PROT 7.1  ALBUMIN 4.0    Recent Labs  11/28/15 1742 08/20/16 2038  WBC 9.2 10.0  HGB 12.4 11.1*  HCT 36.8 32.4*  MCV 99.1 94.8  PLT 158 133*   No results found for: TSH No results found for: HGBA1C No results found for: CHOL, HDL, LDLCALC, LDLDIRECT, TRIG, CHOLHDL  Significant Diagnostic  Results in last 30 days:  No results found.  Assessment/Plan 1. Age-related osteoporosis with current pathological fracture of left humerus with routine healing  Follow-up x-rays of left shoulder and elbow  Patient to establish relationship with local orthopedist for consistent follow-up  Continue pain control with current pain regimen  Continue work with physical therapy/occupational therapy  Fall precautions  2. Generalized anxiety disorder  Alprazolam 0.25 mg by mouth twice a day scheduled  Alprazolam 0.25 mg by mouth twice a day when necessary for anxiety or sleep  3. Pain, acute due to trauma  Tylenol 650 mg by mouth 4 times a day scheduled for pain  Gabapentin 300 mg by mouth 3 times a day for pain and neuropathy  Tramadol 50 mg by mouth every 4 hours when necessary pain  Family/ staff Communication:   Total Time:  Documentation:  Face to Face:  Family/Phone:   Labs/tests ordered:  Left shoulder, elbow xrays  Medication list reviewed and assessed for continued appropriateness.  Vikki Ports, NP-C Geriatrics [facility] [phone], [place] [address] Cell Phone (Mon-Fri 8am-5pm):  [phone] On Call:  [phone] & follow prompts after 5pm & weekends Office Phone:  [phone] Office Fax:  [phone]

## 2016-09-26 NOTE — Progress Notes (Signed)
Location:      Place of Service:  SNF (31) Provider:  Toni Arthurs, NP-C  MASOUD,JAVED, MD  Patient Care Team: Cletis Athens, MD as PCP - General (Internal Medicine) Seeplaputhur Robinette Haines, MD (General Surgery) Cletis Athens, MD (Internal Medicine)  Extended Emergency Contact Information Primary Emergency Contact: Harden Mo of [place] Phone: [phone] Relation: Daughter Secondary Emergency Contact: Elliot Gurney States of [place] Mobile Phone: [phone] Relation: Other  Code Status:  DO NOT RESUSCITATE Goals of care: Advanced Directive information Advanced Directives 08/20/2016  Does Patient Have a Medical Advance Directive? No  Type of Advance Directive -  Copy of [place] in Chart? -     Chief Complaint  Patient presents with  . Follow-up    HPI:  Pt is a 82 y.o. female seen today for a Follow-up visit for Acute onset hypertension. Patient began having elevated pressures, especially when working with physical therapy. Patient has a history of anxiety and hypertension. Patient does endorse increased anxiety with therapy, concerns of falling. Patient has a tendency to minimize the effects when relating this to staff. Patient agreeable to having scheduled anti-anxiety medications. Pt reports pain is more controlled. Edema has decreased. Ecchymosis is dissipating. No falls. No new fractures. Patient continues to work with physical therapy. Vital signs stable. No other complaints.    Past Medical History:  Diagnosis Date  . Anemia   . CHF (congestive heart failure) ([place])   . Emphysema of lung ([place])   . Hyperlipidemia   . Neuropathy ([place])   . Osteoporosis    Past Surgical History:  Procedure Laterality Date  . ABDOMINAL HYSTERECTOMY  1982  . CHOLECYSTECTOMY  1992  . JOINT REPLACEMENT  2005    Allergies  Allergen Reactions  . Morphine And Related     Heart problems   . Zoloft [Sertraline Hcl] Swelling       Medication List       Accurate as of 09/25/16 11:59 PM. Always use your most recent med list.          ALPRAZolam 0.25 MG tablet Commonly known as:  XANAX Take 1 tablet by mouth daily.   aspirin 81 MG tablet Take 81 mg by mouth daily.   calcium carbonate 200 MG capsule Take 250 mg by mouth 2 (two) times daily with a meal.   cholecalciferol 400 units Tabs tablet Commonly known as:  VITAMIN D Take 400 Units by mouth.   clopidogrel 75 MG tablet Commonly known as:  PLAVIX Take 1 tablet by mouth daily.   cyanocobalamin 1000 MCG tablet Take 100 mcg by mouth daily.   dicyclomine 20 MG tablet Commonly known as:  BENTYL Take 1 tablet (20 mg total) by mouth 3 (three) times daily as needed for spasms.   FLECTOR 1.3 % Ptch Generic drug:  diclofenac 1 patch daily.   furosemide 20 MG tablet Commonly known as:  LASIX Take 1 tablet by mouth daily.   gabapentin 300 MG capsule Commonly known as:  NEURONTIN Take 1 capsule by mouth daily.   iron polysaccharides 150 MG capsule Commonly known as:  NIFEREX Take 150 mg by mouth daily.   loratadine 10 MG tablet Commonly known as:  CLARITIN Take 10 mg by mouth daily.   magnesium 30 MG tablet Take 30 mg by mouth 2 (two) times daily.   metoprolol tartrate 25 MG tablet Commonly known as:  LOPRESSOR Take 1 tablet by mouth daily.   multivitamin with minerals tablet  Take 1 tablet by mouth daily.   nitroGLYCERIN 0.2 mg/hr patch Commonly known as:  NITRODUR - Dosed in mg/24 hr 1 patch daily.   ondansetron 4 MG tablet Commonly known as:  ZOFRAN Take 1 tablet (4 mg total) by mouth every 8 (eight) hours as needed for nausea or vomiting.   potassium chloride SA 20 MEQ tablet Commonly known as:  K-DUR,KLOR-CON Take 1 tablet by mouth daily.   ranitidine 150 MG capsule Commonly known as:  ZANTAC Take 150 mg by mouth 2 (two) times daily.   triamcinolone 0.025 % cream Commonly known as:  KENALOG 1 application daily.        Review of Systems  Constitutional: Negative for activity change, appetite change, chills, diaphoresis and fever.  HENT: Negative for congestion, sneezing, sore throat, trouble swallowing and voice change.   Eyes: Negative.   Respiratory: Negative for apnea, cough, choking, chest tightness, shortness of breath and wheezing.   Cardiovascular: Negative for chest pain, palpitations and leg swelling.  Gastrointestinal: Negative.   Genitourinary: Negative.   Musculoskeletal: Positive for arthralgias (typical arthritis), joint swelling and myalgias. Negative for back pain and gait problem.  Skin: Negative for color change, pallor, rash and wound.  Neurological: Positive for weakness. Negative for dizziness, tremors, syncope, speech difficulty, numbness and headaches.  Psychiatric/Behavioral: Negative.   All other systems reviewed and are negative.   Immunization History  Administered Date(s) Administered  . Tdap 08/20/2016   Pertinent  Health Maintenance Due  Topic Date Due  . DEXA SCAN  01/14/1990  . PNA vac Low Risk Adult (1 of 2 - PCV13) 01/14/1990  . INFLUENZA VACCINE  05/19/2016   No flowsheet data found. Functional Status Survey:    Vitals:   09/25/16 1339  BP: (!) 160/57  Pulse: 78  Resp: 18  Temp: 97.9 F (36.6 C)  SpO2: 95%  Weight: 145 lb 11.2 oz (66.1 kg)   Body mass index is 23.52 kg/m. Physical Exam  Constitutional: She is oriented to person, place, and time. Vital signs are normal. She appears well-developed and well-nourished. She is active and cooperative. She does not appear ill. No distress.  HENT:  Head: Normocephalic and atraumatic.  Mouth/Throat: Uvula is midline, oropharynx is clear and moist and mucous membranes are normal. Mucous membranes are not pale, not dry and not cyanotic.  Eyes: Conjunctivae, EOM and lids are normal. Pupils are equal, round, and reactive to light.  Neck: Trachea normal, normal range of motion and full passive range of  motion without pain. Neck supple. No JVD present. No tracheal deviation, no edema and no erythema present. No thyromegaly present.  Cardiovascular: Normal rate, regular rhythm, normal heart sounds, intact distal pulses and normal pulses.  Exam reveals no gallop, no distant heart sounds and no friction rub.   No murmur heard. Pulmonary/Chest: Effort normal and breath sounds normal. No accessory muscle usage. No respiratory distress. She has no decreased breath sounds. She has no wheezes. She has no rhonchi. She has no rales. She exhibits no tenderness.  Abdominal: Normal appearance and bowel sounds are normal. She exhibits no distension and no ascites. There is no tenderness.  Musculoskeletal: She exhibits no edema.       Left elbow: She exhibits decreased range of motion and swelling. Tenderness found.  Expected osteoarthritis, stiffness  Neurological: She is alert and oriented to person, place, and time. She has normal strength.  Skin: Skin is warm, dry and intact. Bruising and ecchymosis noted. She is not diaphoretic. No  cyanosis. No pallor. Nails show no clubbing.     Psychiatric: Her speech is normal and behavior is normal. Judgment and thought content normal. Her mood appears anxious. Cognition and memory are normal.  Nursing note and vitals reviewed.   Labs reviewed:  Recent Labs  11/28/15 1742 08/20/16 2038  NA 138 139  K 5.2* 4.8  CL 101 101  CO2 28 29  GLUCOSE 140* 113*  BUN 40* 39*  CREATININE 1.68* 1.65*  CALCIUM 10.1 9.7    Recent Labs  11/28/15 1742  AST 24  ALT 17  ALKPHOS 96  BILITOT 0.6  PROT 7.1  ALBUMIN 4.0    Recent Labs  11/28/15 1742 08/20/16 2038  WBC 9.2 10.0  HGB 12.4 11.1*  HCT 36.8 32.4*  MCV 99.1 94.8  PLT 158 133*   No results found for: TSH No results found for: HGBA1C No results found for: CHOL, HDL, LDLCALC, LDLDIRECT, TRIG, CHOLHDL  Significant Diagnostic Results in last 30 days:      Assessment/Plan 1. Age-related  osteoporosis with current pathological fracture of left humerus with routine healing  Follow-up x-rays of left shoulder and elbow Reviewed  Patient to establish relationship with local orthopedist for consistent follow-up  Continue pain control with current pain regimen  Continue work with physical therapy/occupational therapy  Fall precautions  2. Generalized anxiety disorder  Continue Alprazolam 0.25 mg by mouth twice a day scheduled  Continue Alprazolam 0.25 mg by mouth twice a day when necessary for anxiety or sleep  3. Pain, acute due to trauma  Continue Tylenol 650 mg by mouth 4 times a day scheduled for pain  Continue Gabapentin 300 mg by mouth 3 times a day for pain and neuropathy  Continue Tramadol 50 mg by mouth every 4 hours when necessary pain  Family/ staff Communication:   Total Time:  Documentation:  Face to Face:  Family/Phone:   Labs/tests ordered:  Left shoulder, elbow xrays  Medication list reviewed and assessed for continued appropriateness.  Vikki Ports, NP-C Geriatrics [facility] [phone], [place] [address] Cell Phone (Mon-Fri 8am-5pm):  [phone] On Call:  [phone] & follow prompts after 5pm & weekends Office Phone:  [phone] Office Fax:  [phone]

## 2016-09-27 LAB — URINE CULTURE

## 2016-09-29 ENCOUNTER — Non-Acute Institutional Stay (SKILLED_NURSING_FACILITY): Payer: Medicare Other | Admitting: Gerontology

## 2016-09-29 DIAGNOSIS — G8911 Acute pain due to trauma: Secondary | ICD-10-CM

## 2016-09-29 DIAGNOSIS — F411 Generalized anxiety disorder: Secondary | ICD-10-CM

## 2016-09-29 DIAGNOSIS — M80022D Age-related osteoporosis with current pathological fracture, left humerus, subsequent encounter for fracture with routine healing: Secondary | ICD-10-CM | POA: Diagnosis not present

## 2016-10-05 ENCOUNTER — Emergency Department: Payer: Medicare Other

## 2016-10-05 ENCOUNTER — Emergency Department
Admission: EM | Admit: 2016-10-05 | Discharge: 2016-10-06 | Disposition: A | Discharge status: Home / Self Care | Payer: Medicare Other | Attending: Emergency Medicine | Admitting: Emergency Medicine

## 2016-10-05 ENCOUNTER — Encounter: Payer: Self-pay | Admitting: Emergency Medicine

## 2016-10-05 DIAGNOSIS — Z79899 Other long term (current) drug therapy: Secondary | ICD-10-CM | POA: Insufficient documentation

## 2016-10-05 DIAGNOSIS — R531 Weakness: Secondary | ICD-10-CM | POA: Diagnosis not present

## 2016-10-05 DIAGNOSIS — I110 Hypertensive heart disease with heart failure: Secondary | ICD-10-CM | POA: Insufficient documentation

## 2016-10-05 DIAGNOSIS — Z7982 Long term (current) use of aspirin: Secondary | ICD-10-CM | POA: Insufficient documentation

## 2016-10-05 DIAGNOSIS — I509 Heart failure, unspecified: Secondary | ICD-10-CM | POA: Insufficient documentation

## 2016-10-05 DIAGNOSIS — I10 Essential (primary) hypertension: Secondary | ICD-10-CM

## 2016-10-05 DIAGNOSIS — R51 Headache: Secondary | ICD-10-CM | POA: Diagnosis not present

## 2016-10-05 HISTORY — DX: Emphysema, unspecified: J43.9

## 2016-10-05 LAB — COMPREHENSIVE METABOLIC PANEL
ALT: 16 U/L (ref 14–54)
AST: 26 U/L (ref 15–41)
Albumin: 3.9 g/dL (ref 3.5–5.0)
Alkaline Phosphatase: 149 U/L — ABNORMAL HIGH (ref 38–126)
Anion gap: 7 (ref 5–15)
BILIRUBIN TOTAL: 0.5 mg/dL (ref 0.3–1.2)
BUN: 48 mg/dL — AB (ref 6–20)
CO2: 29 mmol/L (ref 22–32)
CREATININE: 1.51 mg/dL — AB (ref 0.44–1.00)
Calcium: 9.8 mg/dL (ref 8.9–10.3)
Chloride: 101 mmol/L (ref 101–111)
GFR calc Af Amer: 34 mL/min — ABNORMAL LOW (ref >60)
GFR, EST NON AFRICAN AMERICAN: 29 mL/min — AB (ref >60)
Glucose, Bld: 106 mg/dL — ABNORMAL HIGH (ref 65–99)
Potassium: 4.9 mmol/L (ref 3.5–5.1)
Sodium: 137 mmol/L (ref 135–145)
TOTAL PROTEIN: 7.4 g/dL (ref 6.5–8.1)

## 2016-10-05 LAB — CBC WITH DIFFERENTIAL/PLATELET
BASOS ABS: 0.1 10*3/uL (ref 0–0.1)
BASOS PCT: 1 %
EOS ABS: 0.5 10*3/uL (ref 0–0.7)
EOS PCT: 7 %
HCT: 32.4 % — ABNORMAL LOW (ref 35.0–47.0)
Hemoglobin: 10.9 g/dL — ABNORMAL LOW (ref 12.0–16.0)
Lymphocytes Relative: 19 %
Lymphs Abs: 1.3 10*3/uL (ref 1.0–3.6)
MCH: 33.2 pg (ref 26.0–34.0)
MCHC: 33.7 g/dL (ref 32.0–36.0)
MCV: 98.4 fL (ref 80.0–100.0)
Monocytes Absolute: 1.2 10*3/uL — ABNORMAL HIGH (ref 0.2–0.9)
Monocytes Relative: 17 %
Neutro Abs: 3.8 10*3/uL (ref 1.4–6.5)
Neutrophils Relative %: 56 %
PLATELETS: 161 10*3/uL (ref 150–440)
RBC: 3.29 MIL/uL — AB (ref 3.80–5.20)
RDW: 15.4 % — ABNORMAL HIGH (ref 11.5–14.5)
WBC: 6.8 10*3/uL (ref 3.6–11.0)

## 2016-10-05 LAB — URINALYSIS, COMPLETE (UACMP) WITH MICROSCOPIC
Bacteria, UA: NONE SEEN
Bilirubin Urine: NEGATIVE
GLUCOSE, UA: NEGATIVE mg/dL
HGB URINE DIPSTICK: NEGATIVE
KETONES UR: NEGATIVE mg/dL
LEUKOCYTES UA: NEGATIVE
Nitrite: NEGATIVE
PH: 7 (ref 5.0–8.0)
Protein, ur: 100 mg/dL — AB
Specific Gravity, Urine: 1.008 (ref 1.005–1.030)

## 2016-10-05 LAB — PROTIME-INR
INR: 0.95
PROTHROMBIN TIME: 12.7 s (ref 11.4–15.2)

## 2016-10-05 LAB — BRAIN NATRIURETIC PEPTIDE: B Natriuretic Peptide: 345 pg/mL — ABNORMAL HIGH (ref 0.0–100.0)

## 2016-10-05 LAB — TROPONIN I

## 2016-10-05 MED ORDER — LABETALOL HCL 5 MG/ML IV SOLN
10.0000 mg | Freq: Once | INTRAVENOUS | Status: AC
  Administered 2016-10-05: 10 mg via INTRAVENOUS
  Filled 2016-10-05: qty 4

## 2016-10-05 NOTE — ED Notes (Signed)
Pt placed on bedpan to collect sample. Pt not able to roll but is able to lift hips without difficulty.

## 2016-10-05 NOTE — ED Triage Notes (Signed)
Pt to ED via EMS from [facility] rehab facility c/o generalized weakness and hypertension.  Patient with broken left humerus after fall on November 2nd.  Pt presents A&Ox4, speaking in complete and coherent sentences, chest rise even and unlabored.

## 2016-10-06 MED ORDER — FUROSEMIDE 20 MG PO TABS: 20.0000 mg | ORAL_TABLET | Freq: Every day | ORAL | 0 refills | Status: DC

## 2016-10-06 MED ORDER — CARVEDILOL 12.5 MG PO TABS: 12.5000 mg | ORAL_TABLET | Freq: Two times a day (BID) | ORAL | 11 refills | Status: DC

## 2016-10-06 NOTE — Discharge Instructions (Signed)
Please stop taking the metoprolol, start taking Coreg, 12.5 mg tablets twice a day. Take Lasix, 20 mg a day, for one week. Be very careful when you stand up when you change her blood pressure regimen. Please talk to her doctor tomorrow about her blood pressure regimen as it is a very unusual course a blood pressure medication. Return to the emergency room for chest pain shortness of breath, headache or any other new or worrisome symptoms.

## 2016-10-06 NOTE — ED Provider Notes (Signed)
[facility] Emergency Department Provider Note  ____________________________________________   I have reviewed the triage vital signs and the nursing notes.   HISTORY  Chief Complaint Hypertension    HPI [name] [name] is a 82 y.o. female who states that her blood pressure is very high. As her chief complaint. She states that she has a very slight headache which is not unusual for her. Not worst headache of life. However because her blood pressures she was sent in. Patient has been on the same blood pressure medications for some time but no change. This is somewhat unusual regimen of blood pressure medication.It consists of a nitroglycerin patch and metoprolol. In any event, her blood pressure sometimes spikes. She also has a history of fluid retention and she's been taking Lasix on and off as needed she feels that she's gained some weight recently. However she has no shortness of breath or she has slight cough. She denies dysuria or urinary frequency she denies focal numbness or weakness, and it was gradual in onset, not worst headache of life. Patient is somewhat anxious. She recently hurt her arm and she has been staying in a rehabilitation facility. They have not changed her regimen to the extent that we can determine from her MAR.       Past Medical History:  Diagnosis Date  . Anemia   . CHF (congestive heart failure) ([place])   . Emphysema lung ([place])   . Emphysema of lung ([place])   . Hyperlipidemia   . Neuropathy ([place])   . Osteoporosis     Patient Active Problem List   Diagnosis Date Noted  . Generalized anxiety disorder 09/26/2016  . PAD (peripheral artery disease) ([place]) 10/05/2013  . Chronic venous insufficiency 10/05/2013    Past Surgical History:  Procedure Laterality Date  . ABDOMINAL HYSTERECTOMY  1982  . CATARACT EXTRACTION    . CHOLECYSTECTOMY  1992  . JOINT REPLACEMENT  2005    Prior to Admission medications   Medication Sig Start  Date End Date Taking? Authorizing Provider  ALPRAZolam Duanne Moron) 0.25 MG tablet Take 1 tablet by mouth daily. 09/18/13   Historical Provider, MD  aspirin 81 MG tablet Take 81 mg by mouth daily.    Historical Provider, MD  calcium carbonate 200 MG capsule Take 250 mg by mouth 2 (two) times daily with a meal.    Historical Provider, MD  cholecalciferol (VITAMIN D) 400 UNITS TABS tablet Take 400 Units by mouth.    Historical Provider, MD  clopidogrel (PLAVIX) 75 MG tablet Take 1 tablet by mouth daily. 09/18/13   Historical Provider, MD  cyanocobalamin 1000 MCG tablet Take 100 mcg by mouth daily.    Historical Provider, MD  dicyclomine (BENTYL) 20 MG tablet Take 1 tablet (20 mg total) by mouth 3 (three) times daily as needed for spasms. 11/28/15   Daymon Larsen, MD  FLECTOR 1.3 % PTCH 1 patch daily. 09/18/13   Historical Provider, MD  furosemide (LASIX) 20 MG tablet Take 1 tablet by mouth daily. 09/18/13   Historical Provider, MD  gabapentin (NEURONTIN) 300 MG capsule Take 1 capsule by mouth daily. 09/04/13   Historical Provider, MD  iron polysaccharides (NIFEREX) 150 MG capsule Take 150 mg by mouth daily.    Historical Provider, MD  loratadine (CLARITIN) 10 MG tablet Take 10 mg by mouth daily.    Historical Provider, MD  magnesium 30 MG tablet Take 30 mg by mouth 2 (two) times daily.    Historical Provider,  MD  metoprolol tartrate (LOPRESSOR) 25 MG tablet Take 1 tablet by mouth daily. 09/18/13   Historical Provider, MD  Multiple Vitamins-Minerals (MULTIVITAMIN WITH MINERALS) tablet Take 1 tablet by mouth daily.    Historical Provider, MD  nitroGLYCERIN (NITRODUR - DOSED IN MG/24 HR) 0.2 mg/hr patch 1 patch daily. 09/18/13   Historical Provider, MD  ondansetron (ZOFRAN) 4 MG tablet Take 1 tablet (4 mg total) by mouth every 8 (eight) hours as needed for nausea or vomiting. 11/28/15   Daymon Larsen, MD  potassium chloride SA (K-DUR,KLOR-CON) 20 MEQ tablet Take 1 tablet by mouth daily. 09/18/13   Historical  Provider, MD  ranitidine (ZANTAC) 150 MG capsule Take 150 mg by mouth 2 (two) times daily.    Historical Provider, MD  triamcinolone (KENALOG) [id number] % cream 1 application daily. 09/18/13   Historical Provider, MD    Allergies Morphine and related and Zoloft [sertraline hcl]  Family History  Problem Relation Age of Onset  . Colon cancer Sister     Social History Social History  Substance Use Topics  . Smoking status: Never Smoker  . Smokeless tobacco: Never Used  . Alcohol use No    Review of Systems Constitutional: No fever/chills Eyes: No visual changes. ENT: No sore throat. No stiff neck no neck pain Cardiovascular: Denies chest pain. Respiratory: Denies shortness of breath. Gastrointestinal:   no vomiting.  No diarrhea.  No constipation. Genitourinary: Negative for dysuria. Musculoskeletal: Negative lower extremity swelling Skin: Negative for rash. Neurological: Negative for severe headaches, focal weakness or numbness. 10-point ROS otherwise negative.  ____________________________________________   PHYSICAL EXAM:  VITAL SIGNS: ED Triage Vitals  Enc Vitals Group     BP 10/05/16 2120 (!) 226/113     Pulse Rate 10/05/16 2120 85     Resp 10/05/16 2120 15     Temp 10/05/16 2120 97.7 F (36.5 C)     Temp Source 10/05/16 2120 Oral     SpO2 10/05/16 2120 98 %     Weight 10/05/16 2121 149 lb (67.6 kg)     Height 10/05/16 2121 5\' 6"  (1.676 m)     Head Circumference --      Peak Flow --      Pain Score 10/05/16 2122 3     Pain Loc --      Pain Edu? --      Excl. in [place]? --     Constitutional: Alert and oriented. Well appearing and in no acute distress. Eyes: Conjunctivae are normal. PERRL. EOMI. Head: Atraumatic. Nose: No congestion/rhinnorhea. Mouth/Throat: Mucous membranes are moist.  Oropharynx non-erythematous. Neck: No stridor.   Nontender with no meningismus Cardiovascular: Normal rate, regular rhythm. Grossly normal heart sounds.  Good peripheral  circulation. Respiratory: Normal respiratory effort.  No retractions. Lungs CTAB. Abdominal: Soft and nontender. No distention. No guarding no rebound Back:  There is no focal tenderness or step off.  there is no midline tenderness there are no lesions noted. there is no CVA tenderness  Musculoskeletal: No lower extremity tenderness, no upper extremity tenderness. No joint effusions, no DVT signs strong distal pulses no edema Neurologic:  Normal speech and language. No gross focal neurologic deficits are appreciated.  Skin:  Skin is warm, dry and intact. No rash noted. Psychiatric: Mood and affect are normal. Speech and behavior are normal.  ____________________________________________   LABS (all labs ordered are listed, but only abnormal results are displayed)  Labs Reviewed  URINALYSIS, COMPLETE (UACMP) WITH MICROSCOPIC - Abnormal; Notable for  the following:       Result Value   Color, Urine STRAW (*)    APPearance CLEAR (*)    Protein, ur 100 (*)    Squamous Epithelial / LPF 0-5 (*)    All other components within normal limits  COMPREHENSIVE METABOLIC PANEL - Abnormal; Notable for the following:    Glucose, Bld 106 (*)    BUN 48 (*)    Creatinine, Ser 1.51 (*)    Alkaline Phosphatase 149 (*)    GFR calc non Af Amer 29 (*)    GFR calc Af Amer 34 (*)    All other components within normal limits  CBC WITH DIFFERENTIAL/PLATELET - Abnormal; Notable for the following:    RBC 3.29 (*)    Hemoglobin 10.9 (*)    HCT 32.4 (*)    RDW 15.4 (*)    Monocytes Absolute 1.2 (*)    All other components within normal limits  BRAIN NATRIURETIC PEPTIDE - Abnormal; Notable for the following:    B Natriuretic Peptide 345.0 (*)    All other components within normal limits  TROPONIN I  PROTIME-INR   ____________________________________________  EKG  I personally interpreted any EKGs ordered by me or triage Normal sinus rhythm rate 84 bpm no acute ST elevation or acute ST depression normal  axis ____________________________________________  RADIOLOGY  I reviewed any imaging ordered by me or triage that were performed during my shift and, if possible, patient and/or family made aware of any abnormal findings. ____________________________________________   PROCEDURES  Procedure(s) performed: None  Procedures  Critical Care performed: None  ____________________________________________   INITIAL IMPRESSION / ASSESSMENT AND PLAN / ED COURSE  Pertinent labs & imaging results that were available during my care of the patient were reviewed by me and considered in my medical decision making (see chart for details).  Patient with blood pressure that was elevated so that certainly was anxiety because immediately after she came in it came down somewhat. She is however above what appears to be her baseline. We have given her labetalol. She is in the 160s. She has no headache or any other symptoms she is neurologically intact. Blood work and vital signs extra and CT are reassuring. We will discharge the patient home. After consultation with the hospitalist, we will stop the patient's metoprolol which is 25 twice a day and it is quite small dose and we will give her Coreg, 12.5 and we will have her follow closely with her primary care doctor for further management of her blood pressure which is not usually something we are in position to the emergency room with the patient has had some degree of uncontrolled blood pressure. Patient does have a long history of renal insufficiency, her creatinine is usually around 1.5 and that's where she is today. Her BUN is usually around 40-50 and that's where she is today. We will give her a day or 2 of Lasix as she states she feels that she has gained some weight while she has been in the facility. Patient and family are very comfortable with discharge. Patient is DO NOT RESUSCITATE she does not wish to be admitted to the hospital. Return precautions and  follow-up given and understood.  Clinical Course    ____________________________________________   FINAL CLINICAL IMPRESSION(S) / ED DIAGNOSES  Final diagnoses:  Weakness      This chart was dictated using voice recognition software.  Despite best efforts to proofread,  errors can occur which can change meaning.  Schuyler Amor, MD 10/06/16 [phone]

## 2016-10-06 NOTE — ED Notes (Signed)
Reviewed d/c instructions, follow-up care, prescriptions with patient. Pt verbalized understanding. [facility] facility and reviewed d/c instructions, follow-up care and prescriptions with caregiver Mateo Flow. Mateo Flow verbalized understanding.

## 2016-10-15 NOTE — Progress Notes (Signed)
Location:      Place of Service:  SNF (31) Provider:  Toni Arthurs, NP-C  MASOUD,JAVED, MD  Patient Care Team: Cletis Athens, MD as PCP - General (Internal Medicine) Seeplaputhur Robinette Haines, MD (General Surgery) Cletis Athens, MD (Internal Medicine)  Extended Emergency Contact Information Primary Emergency Contact: Harden Mo of [place] Phone: [phone] Relation: Daughter  Code Status:  DO NOT RESUSCITATE Goals of care: Advanced Directive information Advanced Directives 10/05/2016  Does Patient Have a Medical Advance Directive? Yes  Type of Advance Directive Out of facility DNR (pink MOST or yellow form)  Copy of [place] in Chart? -     Chief Complaint  Patient presents with  . Acute Visit    HPI:  Pt is a 82 y.o. female seen today for a Follow-up visit for pain, anxiety and humerus fracture. Patient began having elevated blood pressures, especially when working with physical therapy. Patient has a history of anxiety and hypertension. Patient does endorse increased anxiety with therapy, concerns of falling. Patient has a tendency to minimize the effects when relating this to staff. Patient agreeable to having scheduled anti-anxiety medications. Pt now reports anxiety and blood pressures are more controlled. Pt reports pain is more controlled. Edema has decreased. Ecchymosis is dissipating. No falls. No new fractures. Patient continues to work with physical therapy. Pt reports she is beginning to feel much better. Says the pain is not gone, but a lot better. Vital signs stable. No other complaints.    Past Medical History:  Diagnosis Date  . Anemia   . CHF (congestive heart failure) ([place])   . Emphysema lung ([place])   . Emphysema of lung ([place])   . Hyperlipidemia   . Neuropathy ([place])   . Osteoporosis    Past Surgical History:  Procedure Laterality Date  . ABDOMINAL HYSTERECTOMY  1982  . CATARACT EXTRACTION    . CHOLECYSTECTOMY   1992  . JOINT REPLACEMENT  2005    Allergies  Allergen Reactions  . Morphine And Related     Heart problems   . Zoloft [Sertraline Hcl] Swelling    Allergies as of 09/29/2016      Reactions   Morphine And Related    Heart problems   Zoloft [sertraline Hcl] Swelling      Medication List       Accurate as of 09/29/16 11:59 PM. Always use your most recent med list.          ALPRAZolam 0.25 MG tablet Commonly known as:  XANAX Take 1 tablet by mouth daily.   aspirin 81 MG tablet Take 81 mg by mouth daily.   calcium carbonate 200 MG capsule Take 250 mg by mouth 2 (two) times daily with a meal.   cholecalciferol 400 units Tabs tablet Commonly known as:  VITAMIN D Take 400 Units by mouth.   clopidogrel 75 MG tablet Commonly known as:  PLAVIX Take 1 tablet by mouth daily.   cyanocobalamin 1000 MCG tablet Take 100 mcg by mouth daily.   dicyclomine 20 MG tablet Commonly known as:  BENTYL Take 1 tablet (20 mg total) by mouth 3 (three) times daily as needed for spasms.   FLECTOR 1.3 % Ptch Generic drug:  diclofenac 1 patch daily.   gabapentin 300 MG capsule Commonly known as:  NEURONTIN Take 1 capsule by mouth daily.   iron polysaccharides 150 MG capsule Commonly known as:  NIFEREX Take 150 mg by mouth daily.   loratadine 10 MG  tablet Commonly known as:  CLARITIN Take 10 mg by mouth daily.   magnesium 30 MG tablet Take 30 mg by mouth 2 (two) times daily.   multivitamin with minerals tablet Take 1 tablet by mouth daily.   nitroGLYCERIN 0.2 mg/hr patch Commonly known as:  NITRODUR - Dosed in mg/24 hr 1 patch daily.   ondansetron 4 MG tablet Commonly known as:  ZOFRAN Take 1 tablet (4 mg total) by mouth every 8 (eight) hours as needed for nausea or vomiting.   potassium chloride SA 20 MEQ tablet Commonly known as:  K-DUR,KLOR-CON Take 1 tablet by mouth daily.   ranitidine 150 MG capsule Commonly known as:  ZANTAC Take 150 mg by mouth 2 (two)  times daily.   triamcinolone 0.025 % cream Commonly known as:  KENALOG 1 application daily.       Review of Systems  Constitutional: Negative for activity change, appetite change, chills, diaphoresis and fever.  HENT: Negative for congestion, sneezing, sore throat, trouble swallowing and voice change.   Eyes: Negative.   Respiratory: Negative for apnea, cough, choking, chest tightness, shortness of breath and wheezing.   Cardiovascular: Negative for chest pain, palpitations and leg swelling.  Gastrointestinal: Negative.   Genitourinary: Negative.   Musculoskeletal: Positive for arthralgias (typical arthritis), joint swelling and myalgias. Negative for back pain and gait problem.  Skin: Negative for color change, pallor, rash and wound.  Neurological: Positive for weakness. Negative for dizziness, tremors, syncope, speech difficulty, numbness and headaches.  Psychiatric/Behavioral: Negative.   All other systems reviewed and are negative.   Immunization History  Administered Date(s) Administered  . Tdap 08/20/2016   Pertinent  Health Maintenance Due  Topic Date Due  . DEXA SCAN  01/14/1990  . PNA vac Low Risk Adult (1 of 2 - PCV13) 01/14/1990  . INFLUENZA VACCINE  05/19/2016   No flowsheet data found. Functional Status Survey:    Vitals:   09/29/16 0600  BP: (!) 152/62  Pulse: 78  Resp: 17  Temp: 98.1 F (36.7 C)  SpO2: 95%   There is no height or weight on file to calculate BMI. Physical Exam  Constitutional: She is oriented to person, place, and time. Vital signs are normal. She appears well-developed and well-nourished. She is active and cooperative. She does not appear ill. No distress.  HENT:  Head: Normocephalic and atraumatic.  Mouth/Throat: Uvula is midline, oropharynx is clear and moist and mucous membranes are normal. Mucous membranes are not pale, not dry and not cyanotic.  Eyes: Conjunctivae, EOM and lids are normal. Pupils are equal, round, and reactive  to light.  Neck: Trachea normal, normal range of motion and full passive range of motion without pain. Neck supple. No JVD present. No tracheal deviation, no edema and no erythema present. No thyromegaly present.  Cardiovascular: Normal rate, regular rhythm, normal heart sounds, intact distal pulses and normal pulses.  Exam reveals no gallop, no distant heart sounds and no friction rub.   No murmur heard. Pulmonary/Chest: Effort normal and breath sounds normal. No accessory muscle usage. No respiratory distress. She has no decreased breath sounds. She has no wheezes. She has no rhonchi. She has no rales. She exhibits no tenderness.  Abdominal: Normal appearance and bowel sounds are normal. She exhibits no distension and no ascites. There is no tenderness.  Musculoskeletal: She exhibits no edema.       Left elbow: She exhibits decreased range of motion and swelling. Tenderness found.  Expected osteoarthritis, stiffness  Neurological: She  is alert and oriented to person, place, and time. She has normal strength.  Skin: Skin is warm, dry and intact. Bruising and ecchymosis noted. She is not diaphoretic. No cyanosis. No pallor. Nails show no clubbing.     Psychiatric: Her speech is normal and behavior is normal. Judgment and thought content normal. Her mood appears anxious. Cognition and memory are normal.  Nursing note and vitals reviewed.   Labs reviewed:  Recent Labs  11/28/15 1742 08/20/16 2038 10/05/16 2128  NA 138 139 137  K 5.2* 4.8 4.9  CL 101 101 101  CO2 28 29 29   GLUCOSE 140* 113* 106*  BUN 40* 39* 48*  CREATININE 1.68* 1.65* 1.51*  CALCIUM 10.1 9.7 9.8    Recent Labs  11/28/15 1742 10/05/16 2128  AST 24 26  ALT 17 16  ALKPHOS 96 149*  BILITOT 0.6 0.5  PROT 7.1 7.4  ALBUMIN 4.0 3.9    Recent Labs  11/28/15 1742 08/20/16 2038 10/05/16 2128  WBC 9.2 10.0 6.8  NEUTROABS  --   --  3.8  HGB 12.4 11.1* 10.9*  HCT 36.8 32.4* 32.4*  MCV 99.1 94.8 98.4  PLT 158  133* 161   No results found for: TSH No results found for: HGBA1C No results found for: CHOL, HDL, LDLCALC, LDLDIRECT, TRIG, CHOLHDL  Significant Diagnostic Results in last 30 days:      Assessment/Plan 1. Age-related osteoporosis with current pathological fracture of left humerus with routine healing  Follow-up x-rays of left shoulder and elbow Reviewed  Patient to establish relationship with local orthopedist for consistent follow-up  Continue pain control with current pain regimen  Continue work with physical therapy/occupational therapy  Fall precautions  2. Generalized anxiety disorder  Continue Alprazolam 0.25 mg by mouth twice a day scheduled  Continue Alprazolam 0.25 mg by mouth twice a day when necessary for anxiety or sleep  3. Pain, acute due to trauma  Continue Tylenol 650 mg by mouth 4 times a day scheduled for pain  Continue Gabapentin 300 mg by mouth 3 times a day for pain and neuropathy  Continue Tramadol 50 mg by mouth every 4 hours when necessary pain  Family/ staff Communication:   Total Time:  Documentation:  Face to Face:  Family/Phone:   Labs/tests ordered:  Left shoulder, elbow xrays  Medication list reviewed and assessed for continued appropriateness.  Vikki Ports, NP-C Geriatrics [facility] [phone], [place] [address] Cell Phone (Mon-Fri 8am-5pm):  [phone] On Call:  [phone] & follow prompts after 5pm & weekends Office Phone:  [phone] Office Fax:  [phone]

## 2016-10-27 DIAGNOSIS — L97822 Non-pressure chronic ulcer of other part of left lower leg with fat layer exposed: Secondary | ICD-10-CM | POA: Diagnosis not present

## 2016-10-27 DIAGNOSIS — I509 Heart failure, unspecified: Secondary | ICD-10-CM | POA: Diagnosis not present

## 2016-10-27 DIAGNOSIS — I130 Hypertensive heart and chronic kidney disease with heart failure and stage 1 through stage 4 chronic kidney disease, or unspecified chronic kidney disease: Secondary | ICD-10-CM | POA: Diagnosis not present

## 2016-10-27 DIAGNOSIS — L97812 Non-pressure chronic ulcer of other part of right lower leg with fat layer exposed: Secondary | ICD-10-CM | POA: Diagnosis not present

## 2016-10-27 DIAGNOSIS — I87333 Chronic venous hypertension (idiopathic) with ulcer and inflammation of bilateral lower extremity: Secondary | ICD-10-CM | POA: Diagnosis not present

## 2016-10-27 DIAGNOSIS — M80022D Age-related osteoporosis with current pathological fracture, left humerus, subsequent encounter for fracture with routine healing: Secondary | ICD-10-CM | POA: Diagnosis not present

## 2016-10-28 DIAGNOSIS — L97812 Non-pressure chronic ulcer of other part of right lower leg with fat layer exposed: Secondary | ICD-10-CM | POA: Diagnosis not present

## 2016-10-28 DIAGNOSIS — I509 Heart failure, unspecified: Secondary | ICD-10-CM | POA: Diagnosis not present

## 2016-10-28 DIAGNOSIS — I130 Hypertensive heart and chronic kidney disease with heart failure and stage 1 through stage 4 chronic kidney disease, or unspecified chronic kidney disease: Secondary | ICD-10-CM | POA: Diagnosis not present

## 2016-10-28 DIAGNOSIS — L97822 Non-pressure chronic ulcer of other part of left lower leg with fat layer exposed: Secondary | ICD-10-CM | POA: Diagnosis not present

## 2016-10-28 DIAGNOSIS — M80022D Age-related osteoporosis with current pathological fracture, left humerus, subsequent encounter for fracture with routine healing: Secondary | ICD-10-CM | POA: Diagnosis not present

## 2016-10-28 DIAGNOSIS — I87333 Chronic venous hypertension (idiopathic) with ulcer and inflammation of bilateral lower extremity: Secondary | ICD-10-CM | POA: Diagnosis not present

## 2016-10-29 DIAGNOSIS — I130 Hypertensive heart and chronic kidney disease with heart failure and stage 1 through stage 4 chronic kidney disease, or unspecified chronic kidney disease: Secondary | ICD-10-CM | POA: Diagnosis not present

## 2016-10-29 DIAGNOSIS — L97812 Non-pressure chronic ulcer of other part of right lower leg with fat layer exposed: Secondary | ICD-10-CM | POA: Diagnosis not present

## 2016-10-29 DIAGNOSIS — M80022D Age-related osteoporosis with current pathological fracture, left humerus, subsequent encounter for fracture with routine healing: Secondary | ICD-10-CM | POA: Diagnosis not present

## 2016-10-29 DIAGNOSIS — I87333 Chronic venous hypertension (idiopathic) with ulcer and inflammation of bilateral lower extremity: Secondary | ICD-10-CM | POA: Diagnosis not present

## 2016-10-29 DIAGNOSIS — L97822 Non-pressure chronic ulcer of other part of left lower leg with fat layer exposed: Secondary | ICD-10-CM | POA: Diagnosis not present

## 2016-10-29 DIAGNOSIS — I509 Heart failure, unspecified: Secondary | ICD-10-CM | POA: Diagnosis not present

## 2016-10-30 DIAGNOSIS — M80022A Age-related osteoporosis with current pathological fracture, left humerus, initial encounter for fracture: Secondary | ICD-10-CM | POA: Diagnosis not present

## 2016-10-30 DIAGNOSIS — S42352D Displaced comminuted fracture of shaft of humerus, left arm, subsequent encounter for fracture with routine healing: Secondary | ICD-10-CM | POA: Diagnosis not present

## 2016-11-02 DIAGNOSIS — I87333 Chronic venous hypertension (idiopathic) with ulcer and inflammation of bilateral lower extremity: Secondary | ICD-10-CM | POA: Diagnosis not present

## 2016-11-02 DIAGNOSIS — I509 Heart failure, unspecified: Secondary | ICD-10-CM | POA: Diagnosis not present

## 2016-11-02 DIAGNOSIS — M80022D Age-related osteoporosis with current pathological fracture, left humerus, subsequent encounter for fracture with routine healing: Secondary | ICD-10-CM | POA: Diagnosis not present

## 2016-11-02 DIAGNOSIS — L97812 Non-pressure chronic ulcer of other part of right lower leg with fat layer exposed: Secondary | ICD-10-CM | POA: Diagnosis not present

## 2016-11-02 DIAGNOSIS — I130 Hypertensive heart and chronic kidney disease with heart failure and stage 1 through stage 4 chronic kidney disease, or unspecified chronic kidney disease: Secondary | ICD-10-CM | POA: Diagnosis not present

## 2016-11-02 DIAGNOSIS — L97822 Non-pressure chronic ulcer of other part of left lower leg with fat layer exposed: Secondary | ICD-10-CM | POA: Diagnosis not present

## 2016-11-03 DIAGNOSIS — M80022D Age-related osteoporosis with current pathological fracture, left humerus, subsequent encounter for fracture with routine healing: Secondary | ICD-10-CM | POA: Diagnosis not present

## 2016-11-03 DIAGNOSIS — I130 Hypertensive heart and chronic kidney disease with heart failure and stage 1 through stage 4 chronic kidney disease, or unspecified chronic kidney disease: Secondary | ICD-10-CM | POA: Diagnosis not present

## 2016-11-03 DIAGNOSIS — I509 Heart failure, unspecified: Secondary | ICD-10-CM | POA: Diagnosis not present

## 2016-11-03 DIAGNOSIS — E1122 Type 2 diabetes mellitus with diabetic chronic kidney disease: Secondary | ICD-10-CM | POA: Diagnosis not present

## 2016-11-03 DIAGNOSIS — I87333 Chronic venous hypertension (idiopathic) with ulcer and inflammation of bilateral lower extremity: Secondary | ICD-10-CM | POA: Diagnosis not present

## 2016-11-07 ENCOUNTER — Encounter: Payer: Self-pay | Admitting: Emergency Medicine

## 2016-11-07 ENCOUNTER — Inpatient Hospital Stay
Admission: EM | Admit: 2016-11-07 | Discharge: 2016-11-09 | DRG: 308 | Disposition: A | Discharge status: Home / Self Care | Payer: Medicare Other | Attending: Internal Medicine | Admitting: Internal Medicine

## 2016-11-07 ENCOUNTER — Emergency Department: Payer: Medicare Other

## 2016-11-07 ENCOUNTER — Inpatient Hospital Stay: Admit: 2016-11-07 | Payer: Medicare Other

## 2016-11-07 DIAGNOSIS — R531 Weakness: Secondary | ICD-10-CM | POA: Diagnosis not present

## 2016-11-07 DIAGNOSIS — I5031 Acute diastolic (congestive) heart failure: Secondary | ICD-10-CM | POA: Diagnosis present

## 2016-11-07 DIAGNOSIS — J439 Emphysema, unspecified: Secondary | ICD-10-CM | POA: Diagnosis present

## 2016-11-07 DIAGNOSIS — M6281 Muscle weakness (generalized): Secondary | ICD-10-CM

## 2016-11-07 DIAGNOSIS — E785 Hyperlipidemia, unspecified: Secondary | ICD-10-CM | POA: Diagnosis present

## 2016-11-07 DIAGNOSIS — Z66 Do not resuscitate: Secondary | ICD-10-CM | POA: Diagnosis present

## 2016-11-07 DIAGNOSIS — G629 Polyneuropathy, unspecified: Secondary | ICD-10-CM | POA: Diagnosis present

## 2016-11-07 DIAGNOSIS — Z800 Family history of malignant neoplasm of digestive organs: Secondary | ICD-10-CM | POA: Diagnosis not present

## 2016-11-07 DIAGNOSIS — R296 Repeated falls: Secondary | ICD-10-CM | POA: Diagnosis present

## 2016-11-07 DIAGNOSIS — I480 Paroxysmal atrial fibrillation: Principal | ICD-10-CM | POA: Diagnosis present

## 2016-11-07 DIAGNOSIS — I4891 Unspecified atrial fibrillation: Secondary | ICD-10-CM | POA: Diagnosis not present

## 2016-11-07 DIAGNOSIS — I110 Hypertensive heart disease with heart failure: Secondary | ICD-10-CM | POA: Diagnosis not present

## 2016-11-07 DIAGNOSIS — Z79899 Other long term (current) drug therapy: Secondary | ICD-10-CM

## 2016-11-07 DIAGNOSIS — Z7982 Long term (current) use of aspirin: Secondary | ICD-10-CM

## 2016-11-07 DIAGNOSIS — R262 Difficulty in walking, not elsewhere classified: Secondary | ICD-10-CM

## 2016-11-07 DIAGNOSIS — K219 Gastro-esophageal reflux disease without esophagitis: Secondary | ICD-10-CM | POA: Diagnosis present

## 2016-11-07 DIAGNOSIS — N189 Chronic kidney disease, unspecified: Secondary | ICD-10-CM | POA: Diagnosis present

## 2016-11-07 DIAGNOSIS — D649 Anemia, unspecified: Secondary | ICD-10-CM | POA: Diagnosis present

## 2016-11-07 DIAGNOSIS — I872 Venous insufficiency (chronic) (peripheral): Secondary | ICD-10-CM | POA: Diagnosis present

## 2016-11-07 DIAGNOSIS — R197 Diarrhea, unspecified: Secondary | ICD-10-CM | POA: Diagnosis present

## 2016-11-07 DIAGNOSIS — Z9849 Cataract extraction status, unspecified eye: Secondary | ICD-10-CM

## 2016-11-07 DIAGNOSIS — Z888 Allergy status to other drugs, medicaments and biological substances status: Secondary | ICD-10-CM

## 2016-11-07 DIAGNOSIS — J189 Pneumonia, unspecified organism: Secondary | ICD-10-CM | POA: Diagnosis present

## 2016-11-07 DIAGNOSIS — Z90710 Acquired absence of both cervix and uterus: Secondary | ICD-10-CM | POA: Diagnosis not present

## 2016-11-07 DIAGNOSIS — I130 Hypertensive heart and chronic kidney disease with heart failure and stage 1 through stage 4 chronic kidney disease, or unspecified chronic kidney disease: Secondary | ICD-10-CM | POA: Diagnosis present

## 2016-11-07 DIAGNOSIS — Z8673 Personal history of transient ischemic attack (TIA), and cerebral infarction without residual deficits: Secondary | ICD-10-CM

## 2016-11-07 DIAGNOSIS — Z885 Allergy status to narcotic agent status: Secondary | ICD-10-CM | POA: Diagnosis not present

## 2016-11-07 DIAGNOSIS — R600 Localized edema: Secondary | ICD-10-CM | POA: Diagnosis not present

## 2016-11-07 DIAGNOSIS — I739 Peripheral vascular disease, unspecified: Secondary | ICD-10-CM | POA: Diagnosis present

## 2016-11-07 DIAGNOSIS — Z7902 Long term (current) use of antithrombotics/antiplatelets: Secondary | ICD-10-CM | POA: Diagnosis not present

## 2016-11-07 DIAGNOSIS — Z9049 Acquired absence of other specified parts of digestive tract: Secondary | ICD-10-CM | POA: Diagnosis not present

## 2016-11-07 DIAGNOSIS — I10 Essential (primary) hypertension: Secondary | ICD-10-CM

## 2016-11-07 DIAGNOSIS — R0602 Shortness of breath: Secondary | ICD-10-CM | POA: Diagnosis not present

## 2016-11-07 DIAGNOSIS — I509 Heart failure, unspecified: Secondary | ICD-10-CM | POA: Diagnosis not present

## 2016-11-07 DIAGNOSIS — R0600 Dyspnea, unspecified: Secondary | ICD-10-CM | POA: Diagnosis not present

## 2016-11-07 DIAGNOSIS — M810 Age-related osteoporosis without current pathological fracture: Secondary | ICD-10-CM | POA: Diagnosis present

## 2016-11-07 HISTORY — DX: Peripheral vascular disease, unspecified: I73.9

## 2016-11-07 HISTORY — DX: Gastro-esophageal reflux disease without esophagitis: K21.9

## 2016-11-07 HISTORY — DX: Cerebral infarction, unspecified: I63.9

## 2016-11-07 HISTORY — DX: Essential (primary) hypertension: I10

## 2016-11-07 HISTORY — DX: Atherosclerotic heart disease of native coronary artery without angina pectoris: I25.10

## 2016-11-07 HISTORY — DX: Disorder of kidney and ureter, unspecified: N28.9

## 2016-11-07 LAB — C DIFFICILE QUICK SCREEN W PCR REFLEX
C DIFFICLE (CDIFF) ANTIGEN: NEGATIVE
C Diff interpretation: NOT DETECTED
C Diff toxin: NEGATIVE

## 2016-11-07 LAB — INFLUENZA PANEL BY PCR (TYPE A & B)
INFLAPCR: NEGATIVE
INFLBPCR: NEGATIVE

## 2016-11-07 LAB — CBC WITH DIFFERENTIAL/PLATELET
Basophils Absolute: 0 10*3/uL (ref 0–0.1)
Basophils Relative: 1 %
EOS ABS: 0.3 10*3/uL (ref 0–0.7)
Eosinophils Relative: 4 %
HEMATOCRIT: 34.1 % — AB (ref 35.0–47.0)
HEMOGLOBIN: 11.5 g/dL — AB (ref 12.0–16.0)
LYMPHS ABS: 0.7 10*3/uL — AB (ref 1.0–3.6)
Lymphocytes Relative: 9 %
MCH: 33 pg (ref 26.0–34.0)
MCHC: 33.7 g/dL (ref 32.0–36.0)
MCV: 98.1 fL (ref 80.0–100.0)
MONO ABS: 0.7 10*3/uL (ref 0.2–0.9)
Monocytes Relative: 8 %
NEUTROS PCT: 78 %
Neutro Abs: 6.3 10*3/uL (ref 1.4–6.5)
Platelets: 156 10*3/uL (ref 150–440)
RBC: 3.48 MIL/uL — ABNORMAL LOW (ref 3.80–5.20)
RDW: 14.3 % (ref 11.5–14.5)
WBC: 8 10*3/uL (ref 3.6–11.0)

## 2016-11-07 LAB — TROPONIN I
TROPONIN I: 0.06 ng/mL — AB (ref <0.03)
Troponin I: 0.05 ng/mL (ref <0.03)
Troponin I: 0.07 ng/mL (ref <0.03)

## 2016-11-07 LAB — BASIC METABOLIC PANEL
Anion gap: 7 (ref 5–15)
BUN: 35 mg/dL — AB (ref 6–20)
CALCIUM: 9.8 mg/dL (ref 8.9–10.3)
CHLORIDE: 109 mmol/L (ref 101–111)
CO2: 25 mmol/L (ref 22–32)
CREATININE: 1.59 mg/dL — AB (ref 0.44–1.00)
GFR calc Af Amer: 32 mL/min — ABNORMAL LOW (ref >60)
GFR calc non Af Amer: 27 mL/min — ABNORMAL LOW (ref >60)
Glucose, Bld: 103 mg/dL — ABNORMAL HIGH (ref 65–99)
Potassium: 4.6 mmol/L (ref 3.5–5.1)
SODIUM: 141 mmol/L (ref 135–145)

## 2016-11-07 LAB — URINALYSIS, COMPLETE (UACMP) WITH MICROSCOPIC
Bilirubin Urine: NEGATIVE
Glucose, UA: NEGATIVE mg/dL
Hgb urine dipstick: NEGATIVE
KETONES UR: NEGATIVE mg/dL
LEUKOCYTES UA: NEGATIVE
Nitrite: NEGATIVE
PROTEIN: 100 mg/dL — AB
Specific Gravity, Urine: 1.008 (ref 1.005–1.030)
pH: 5 (ref 5.0–8.0)

## 2016-11-07 LAB — MRSA PCR SCREENING: MRSA by PCR: NEGATIVE

## 2016-11-07 LAB — GLUCOSE, CAPILLARY: Glucose-Capillary: 104 mg/dL — ABNORMAL HIGH (ref 65–99)

## 2016-11-07 LAB — TSH: TSH: 2.726 u[IU]/mL (ref 0.350–4.500)

## 2016-11-07 LAB — BRAIN NATRIURETIC PEPTIDE: B Natriuretic Peptide: 991 pg/mL — ABNORMAL HIGH (ref 0.0–100.0)

## 2016-11-07 MED ORDER — FUROSEMIDE 10 MG/ML IJ SOLN
40.0000 mg | Freq: Once | INTRAMUSCULAR | Status: AC
  Administered 2016-11-07: 40 mg via INTRAVENOUS
  Filled 2016-11-07: qty 4

## 2016-11-07 MED ORDER — ASPIRIN 81 MG PO TABS: 81.0000 mg | ORAL_TABLET | Freq: Every day | ORAL | Status: DC

## 2016-11-07 MED ORDER — ONDANSETRON HCL 4 MG PO TABS: 4.0000 mg | ORAL_TABLET | Freq: Four times a day (QID) | ORAL | Status: DC | PRN

## 2016-11-07 MED ORDER — AZITHROMYCIN 500 MG PO TABS
500.0000 mg | ORAL_TABLET | Freq: Once | ORAL | Status: AC
  Administered 2016-11-07: 500 mg via ORAL
  Filled 2016-11-07: qty 1

## 2016-11-07 MED ORDER — CLOPIDOGREL BISULFATE 75 MG PO TABS
75.0000 mg | ORAL_TABLET | Freq: Every day | ORAL | Status: DC
  Administered 2016-11-07 – 2016-11-09 (×3): 75 mg via ORAL
  Filled 2016-11-07 (×3): qty 1

## 2016-11-07 MED ORDER — DEXTROSE 5 % IV SOLN
5.0000 mg/h | INTRAVENOUS | Status: DC
  Filled 2016-11-07: qty 100

## 2016-11-07 MED ORDER — ASPIRIN 81 MG PO CHEW
81.0000 mg | CHEWABLE_TABLET | Freq: Every day | ORAL | Status: DC
  Administered 2016-11-07 – 2016-11-09 (×3): 81 mg via ORAL
  Filled 2016-11-07 (×3): qty 1

## 2016-11-07 MED ORDER — FUROSEMIDE 10 MG/ML IJ SOLN
40.0000 mg | Freq: Two times a day (BID) | INTRAMUSCULAR | Status: DC
  Administered 2016-11-08: 40 mg via INTRAVENOUS
  Filled 2016-11-07: qty 4

## 2016-11-07 MED ORDER — METOPROLOL TARTRATE 5 MG/5ML IV SOLN
5.0000 mg | Freq: Once | INTRAVENOUS | Status: AC
  Administered 2016-11-07: 5 mg via INTRAVENOUS
  Filled 2016-11-07: qty 5

## 2016-11-07 MED ORDER — FUROSEMIDE 10 MG/ML IJ SOLN: 40.0000 mg | Freq: Two times a day (BID) | INTRAMUSCULAR | Status: DC

## 2016-11-07 MED ORDER — HYDRALAZINE HCL 20 MG/ML IJ SOLN
10.0000 mg | INTRAMUSCULAR | Status: DC | PRN
  Administered 2016-11-07: 10 mg via INTRAVENOUS
  Filled 2016-11-07: qty 1

## 2016-11-07 MED ORDER — ALPRAZOLAM 0.25 MG PO TABS
0.2500 mg | ORAL_TABLET | Freq: Two times a day (BID) | ORAL | Status: DC | PRN
  Administered 2016-11-07 – 2016-11-08 (×2): 0.25 mg via ORAL
  Filled 2016-11-07 (×2): qty 1

## 2016-11-07 MED ORDER — TRAMADOL HCL 50 MG PO TABS
50.0000 mg | ORAL_TABLET | ORAL | Status: DC | PRN
  Administered 2016-11-08: 50 mg via ORAL
  Filled 2016-11-07: qty 1

## 2016-11-07 MED ORDER — SODIUM CHLORIDE 0.9% FLUSH
3.0000 mL | Freq: Two times a day (BID) | INTRAVENOUS | Status: DC
  Administered 2016-11-07 – 2016-11-09 (×3): 3 mL via INTRAVENOUS

## 2016-11-07 MED ORDER — DEXTROSE 5 % IV SOLN
1.0000 g | INTRAVENOUS | Status: DC
  Administered 2016-11-08: 1 g via INTRAVENOUS
  Filled 2016-11-07 (×3): qty 10

## 2016-11-07 MED ORDER — FAMOTIDINE IN NACL 20-0.9 MG/50ML-% IV SOLN
20.0000 mg | INTRAVENOUS | Status: DC
  Administered 2016-11-07 – 2016-11-08 (×2): 20 mg via INTRAVENOUS
  Filled 2016-11-07 (×3): qty 50

## 2016-11-07 MED ORDER — BISACODYL 10 MG RE SUPP: 10.0000 mg | Freq: Every day | RECTAL | Status: DC | PRN

## 2016-11-07 MED ORDER — CEFTRIAXONE SODIUM-DEXTROSE 1-3.74 GM-% IV SOLR
1.0000 g | Freq: Once | INTRAVENOUS | Status: AC
  Administered 2016-11-07: 1 g via INTRAVENOUS
  Filled 2016-11-07: qty 50

## 2016-11-07 MED ORDER — SODIUM CHLORIDE 0.9 % IV SOLN: 250.0000 mL | INTRAVENOUS | Status: DC | PRN

## 2016-11-07 MED ORDER — DEXTROSE 5 % IV SOLN
500.0000 mg | INTRAVENOUS | Status: DC
  Administered 2016-11-08 – 2016-11-09 (×2): 500 mg via INTRAVENOUS
  Filled 2016-11-07 (×3): qty 500

## 2016-11-07 MED ORDER — DILTIAZEM LOAD VIA INFUSION
20.0000 mg | Freq: Once | INTRAVENOUS | Status: DC
  Filled 2016-11-07: qty 20

## 2016-11-07 MED ORDER — SODIUM CHLORIDE 0.9% FLUSH: 3.0000 mL | INTRAVENOUS | Status: DC | PRN

## 2016-11-07 MED ORDER — DEXTROSE 5 % IV SOLN
500.0000 mg | INTRAVENOUS | Status: DC
  Filled 2016-11-07: qty 500

## 2016-11-07 MED ORDER — ACETAMINOPHEN 650 MG RE SUPP: 650.0000 mg | Freq: Four times a day (QID) | RECTAL | Status: DC | PRN

## 2016-11-07 MED ORDER — TRAZODONE HCL 50 MG PO TABS
25.0000 mg | ORAL_TABLET | Freq: Once | ORAL | Status: AC
  Administered 2016-11-07: 25 mg via ORAL
  Filled 2016-11-07: qty 1

## 2016-11-07 MED ORDER — GABAPENTIN 300 MG PO CAPS
300.0000 mg | ORAL_CAPSULE | Freq: Three times a day (TID) | ORAL | Status: DC
  Administered 2016-11-07 – 2016-11-09 (×6): 300 mg via ORAL
  Filled 2016-11-07 (×6): qty 1

## 2016-11-07 MED ORDER — ENOXAPARIN SODIUM 30 MG/0.3ML ~~LOC~~ SOLN
30.0000 mg | SUBCUTANEOUS | Status: DC
  Administered 2016-11-07 – 2016-11-08 (×2): 30 mg via SUBCUTANEOUS
  Filled 2016-11-07 (×2): qty 0.3

## 2016-11-07 MED ORDER — NITROGLYCERIN 2 % TD OINT
2.0000 [in_us] | TOPICAL_OINTMENT | Freq: Four times a day (QID) | TRANSDERMAL | Status: DC
  Administered 2016-11-07 – 2016-11-09 (×8): 2 [in_us] via TOPICAL
  Filled 2016-11-07 (×8): qty 2

## 2016-11-07 MED ORDER — SODIUM CHLORIDE 0.9% FLUSH
3.0000 mL | Freq: Two times a day (BID) | INTRAVENOUS | Status: DC
  Administered 2016-11-07 – 2016-11-09 (×5): 3 mL via INTRAVENOUS

## 2016-11-07 MED ORDER — ACETAMINOPHEN 325 MG PO TABS
650.0000 mg | ORAL_TABLET | Freq: Four times a day (QID) | ORAL | Status: DC | PRN
  Administered 2016-11-08: 650 mg via ORAL
  Filled 2016-11-07: qty 2

## 2016-11-07 MED ORDER — ONDANSETRON HCL 4 MG/2ML IJ SOLN: 4.0000 mg | Freq: Four times a day (QID) | INTRAMUSCULAR | Status: DC | PRN

## 2016-11-07 MED ORDER — CEFTRIAXONE SODIUM 1 G IJ SOLR: 1.0000 g | Freq: Once | INTRAMUSCULAR | Status: DC

## 2016-11-07 MED ORDER — CARVEDILOL 12.5 MG PO TABS
12.5000 mg | ORAL_TABLET | Freq: Two times a day (BID) | ORAL | Status: DC
  Administered 2016-11-07 – 2016-11-09 (×5): 12.5 mg via ORAL
  Filled 2016-11-07 (×2): qty 2
  Filled 2016-11-07 (×2): qty 1
  Filled 2016-11-07: qty 2

## 2016-11-07 MED ORDER — DOCUSATE SODIUM 100 MG PO CAPS
100.0000 mg | ORAL_CAPSULE | Freq: Two times a day (BID) | ORAL | Status: DC
  Administered 2016-11-07 – 2016-11-09 (×3): 100 mg via ORAL
  Filled 2016-11-07 (×3): qty 1

## 2016-11-07 MED ORDER — IPRATROPIUM-ALBUTEROL 0.5-2.5 (3) MG/3ML IN SOLN
3.0000 mL | Freq: Four times a day (QID) | RESPIRATORY_TRACT | Status: DC
  Administered 2016-11-07 (×2): 3 mL via RESPIRATORY_TRACT
  Filled 2016-11-07 (×3): qty 3

## 2016-11-07 MED ORDER — FUROSEMIDE 10 MG/ML IJ SOLN: 40.0000 mg | Freq: Once | INTRAMUSCULAR | Status: DC

## 2016-11-07 MED ORDER — DILTIAZEM HCL 60 MG PO TABS
60.0000 mg | ORAL_TABLET | Freq: Four times a day (QID) | ORAL | Status: DC
  Administered 2016-11-07 – 2016-11-09 (×7): 60 mg via ORAL
  Filled 2016-11-07 (×7): qty 1

## 2016-11-07 NOTE — Progress Notes (Signed)
1430 Very alert and oriented 83Year old female admitted in sinus rhythum . Presented with previous left arm fracture in a sling and shoulder immobilizer and glove on left hand (patient states for edema) Both legs are edemetous and wrapped with kerlex covered with coban. Pedal pulses present with socks on.Left cheek bruise,right arm skin tear

## 2016-11-07 NOTE — Progress Notes (Signed)
Pt having episodes of bradycardia. Notified Dr. Joseph Art. Pacer pads at bedside. No new orders received at this time. Will continue to assess.

## 2016-11-07 NOTE — Progress Notes (Signed)
Renal Monitoring:    83 yo female ordered enoxaparin 40mg  SQ Q24hr. Per protocol for patients with CrCl < 59mL/min and BMI < 40, will transition patient to enoxaparin 30mg  SQ Q24hr.    Will transition famotidine to 20mg  IV Q24hr.    Pharmacy will continue to monitor and adjust per consult.   MLS 11/07/2016

## 2016-11-07 NOTE — ED Notes (Signed)
While in the room, this RN placed patient on bedpan x 2, pt had 1 instance of urine and 1 instance of BM at this time. Pt's daughter at bedside. This RN also placed IV and administered medications. Pt tolerated well. Will continue to monitor for further patient needs at this time.

## 2016-11-07 NOTE — ED Triage Notes (Signed)
Pt arrived to ED by EMS from [place] with c/o of SOB that started last night and generalized weakness for "several days". Pt currently 95-96% on RA.

## 2016-11-07 NOTE — ED Notes (Signed)
Report given to Myra, RN.

## 2016-11-07 NOTE — ED Notes (Signed)
This RN at bedside at this time. This RN changed patient's brief, pt states she was unable to hold her urine after being given lasix. This RN and Paulette, EDT at bedside to change patient at this time. Pt tolerated well. Will continue to monitor for further patient needs.

## 2016-11-07 NOTE — H&P (Signed)
History and Physical    [name] [name] [id number] DOB: 27-Jan-1923 DOA: 11/07/2016  Referring physician: Dr. Alfred Levins PCP: [name] [place], MD  Specialists: none  Chief Complaint: SOB and cough  HPI: [name] [name] is a 83 y.o. female has a past medical history significant for CHF and HTN who presents from ALF with acute onset cough and SOB. In ER, pt did have some chest tightness. Found to be in rapid A-fib of new onset with probable pneumonia on CXR. CHF also noted. Currently pain free. BP elevated. Troponin slightly elevated. She is now admitted.  Review of Systems: The patient denies anorexia, fever, weight loss,, vision loss, decreased hearing, hoarseness,, syncope, balance deficits, hemoptysis, abdominal pain, melena, hematochezia, severe indigestion/heartburn, hematuria, incontinence, genital sores, muscle weakness, suspicious skin lesions, transient blindness, difficulty walking, depression, unusual weight change, abnormal bleeding, enlarged lymph nodes, angioedema, and breast masses.   Past Medical History:  Diagnosis Date  . Anemia   . CHF (congestive heart failure) ([place])   . Emphysema lung ([place])   . Emphysema of lung ([place])   . Hyperlipidemia   . Hypertension   . Neuropathy ([place])   . Osteoporosis   . Renal disorder    Past Surgical History:  Procedure Laterality Date  . ABDOMINAL HYSTERECTOMY  1982  . CATARACT EXTRACTION    . CHOLECYSTECTOMY  1992  . JOINT REPLACEMENT  2005   Social History:  reports that she has never smoked. She has never used smokeless tobacco. She reports that she does not drink alcohol or use drugs.  Allergies  Allergen Reactions  . Morphine And Related     Heart problems   . Zoloft [Sertraline Hcl] Swelling    Family History  Problem Relation Age of Onset  . Colon cancer Sister     Prior to Admission medications   Medication Sig Start Date End Date Taking? Authorizing Provider  acetaminophen (TYLENOL) 325 MG tablet Take 650 mg by  mouth every 6 (six) hours as needed for mild pain or moderate pain.   Yes Historical Provider, MD  ALPRAZolam (XANAX) 0.25 MG tablet Take 0.25 mg by mouth 2 (two) times daily as needed for anxiety or agitation. 08/07/16  Yes Historical Provider, MD  aspirin 81 MG tablet Take 81 mg by mouth daily.   Yes Historical Provider, MD  carvedilol (COREG) 12.5 MG tablet Take 1 tablet (12.5 mg total) by mouth 2 (two) times daily. 10/06/16 10/06/17 Yes Schuyler Amor, MD  celecoxib (CELEBREX) 100 MG capsule Take 100 mg by mouth every other day as needed. 10/15/16  Yes Historical Provider, MD  Cholecalciferol 2000 units CAPS Take 4,000 Units by mouth daily.    Yes Historical Provider, MD  clopidogrel (PLAVIX) 75 MG tablet Take 1 tablet by mouth daily. 09/18/13  Yes Historical Provider, MD  cyanocobalamin 1000 MCG tablet Take 1,000 mcg by mouth daily.    Yes Historical Provider, MD  dicyclomine (BENTYL) 20 MG tablet Take 1 tablet (20 mg total) by mouth 3 (three) times daily as needed for spasms. 11/28/15  Yes Daymon Larsen, MD  FLECTOR 1.3 % PTCH Place 1 patch onto the skin every 12 (twelve) hours.  09/18/13  Yes Historical Provider, MD  gabapentin (NEURONTIN) 300 MG capsule Take 1 capsule by mouth 3 (three) times daily.  09/04/13  Yes Historical Provider, MD  loratadine (CLARITIN) 10 MG tablet Take 10 mg by mouth daily.   Yes Historical Provider, MD  nitroGLYCERIN (NITRODUR - DOSED IN MG/24 HR) 0.2 mg/hr  patch Place 1 patch onto the skin daily.  09/18/13  Yes Historical Provider, MD  ondansetron (ZOFRAN) 4 MG tablet Take 1 tablet (4 mg total) by mouth every 8 (eight) hours as needed for nausea or vomiting. 11/28/15  Yes Daymon Larsen, MD  Probiotic Product (ALIGN) 4 MG CAPS Take 4 mg by mouth daily.   Yes Historical Provider, MD  ranitidine (ZANTAC) 150 MG capsule Take 150 mg by mouth 2 (two) times daily.   Yes Historical Provider, MD  sennosides-docusate sodium (SENOKOT-S) 8.6-50 MG tablet Take 2 tablets by mouth  daily.   Yes Historical Provider, MD  traMADol (ULTRAM) 50 MG tablet Take 50 mg by mouth every 4 (four) hours as needed for moderate pain or severe pain.   Yes Historical Provider, MD  furosemide (LASIX) 20 MG tablet Take 1 tablet (20 mg total) by mouth daily. Patient not taking: Reported on 11/07/2016 10/06/16   Schuyler Amor, MD   Physical Exam: Vitals:   11/07/16 0830 11/07/16 0900 11/07/16 0930 11/07/16 0935  BP: (!) 181/100 (!) 185/94 (!) 161/104 (!) 161/104  Pulse: (!) 145 (!) 118 (!) 114 (!) 128  Resp: 17 (!) 0 20   Temp:      TempSrc:      SpO2: 95% 95% 93%   Weight:      Height:         General:  No apparent distress, [place]/AT, WDWN  Eyes: PERRL, EOMI, no scleral icterus, conjunctiva clear  ENT: moist oropharynx without exudate, TM's benign, dentition fair  Neck: supple, no lymphadenopathy. No bruits or thyromegaly  Cardiovascular: irregularly irregular with rapid rate without MRG; 2+ peripheral pulses, no JVD, 1+ peripheral edema  Respiratory: basilar rales with scattered rhonchi. No wheezing or dullness. Respiratory effort normal  Abdomen: soft, non tender to palpation, positive bowel sounds, no guarding, no rebound  Skin: no rashes or lesions  Musculoskeletal: normal bulk and tone, no joint swelling  Psychiatric: normal mood and affect, A&OX3  Neurologic: CN 2-12 grossly intact, Motor strength 5/5 in all 4 groups with symmetric DTR's and non-focal sensory exam  Labs on Admission:  Basic Metabolic Panel:  Recent Labs Lab 11/07/16 0909  NA 141  K 4.6  CL 109  CO2 25  GLUCOSE 103*  BUN 35*  CREATININE 1.59*  CALCIUM 9.8   Liver Function Tests: No results for input(s): AST, ALT, ALKPHOS, BILITOT, PROT, ALBUMIN in the last 168 hours. No results for input(s): LIPASE, AMYLASE in the last 168 hours. No results for input(s): AMMONIA in the last 168 hours. CBC:  Recent Labs Lab 11/07/16 0909  WBC 8.0  NEUTROABS 6.3  HGB 11.5*  HCT 34.1*  MCV 98.1   PLT 156   Cardiac Enzymes:  Recent Labs Lab 11/07/16 0909  TROPONINI 0.05*    BNP (last 3 results)  Recent Labs  10/05/16 2131 11/07/16 0909  BNP 345.0* 991.0*    ProBNP (last 3 results) No results for input(s): PROBNP in the last 8760 hours.  CBG: No results for input(s): GLUCAP in the last 168 hours.  Radiological Exams on Admission: Dg Chest 2 View  Result Date: 11/07/2016 CLINICAL DATA:  Shortness of breath. EXAM: CHEST  2 VIEW COMPARISON:  October 05, 2016 FINDINGS: New right pleural effusion with underlying atelectasis. Increased opacity in the bases with an interstitial component. There is a more focal nodular region of opacity projected over the right base, not seen 1 month ago. No change in cardiomegaly. The study is otherwise  stable. Chronic left humeral fracture. IMPRESSION: 1. New right pleural effusion.  Probable mild edema. 2. More focal nodular opacity in the right base could represent atelectasis, fluid is in the fissure, or developing infiltrate. Recommend attention on follow-up. 3. Chronic left humeral fractures. Electronically Signed   By: Dorise Bullion III M.D   On: 11/07/2016 09:13   US Venous Img Lower Bilateral  Result Date: 11/07/2016 CLINICAL DATA:  Bilateral lower extremity edema, left greater than right with associated pain. EXAM: BILATERAL LOWER EXTREMITY VENOUS DOPPLER ULTRASOUND TECHNIQUE: Gray-scale sonography with graded compression, as well as color Doppler and duplex ultrasound were performed to evaluate the lower extremity deep venous systems from the level of the common femoral vein and including the common femoral, femoral, profunda femoral, popliteal and calf veins including the posterior tibial, peroneal and gastrocnemius veins when visible. The superficial great saphenous vein was also interrogated. Spectral Doppler was utilized to evaluate flow at rest and with distal augmentation maneuvers in the common femoral, femoral and popliteal  veins. COMPARISON:  None. FINDINGS: RIGHT LOWER EXTREMITY Common Femoral Vein: No evidence of thrombus. Normal compressibility, respiratory phasicity and response to augmentation. Saphenofemoral Junction: No evidence of thrombus. Normal compressibility and flow on color Doppler imaging. Profunda Femoral Vein: No evidence of thrombus. Normal compressibility and flow on color Doppler imaging. Femoral Vein: No evidence of thrombus. Normal compressibility, respiratory phasicity and response to augmentation. Popliteal Vein: No evidence of thrombus. Normal compressibility, respiratory phasicity and response to augmentation. Calf Veins: Limited evaluation of calf veins without evidence of thrombus. Superficial Great Saphenous Vein: No evidence of thrombus. Normal compressibility and flow on color Doppler imaging. Venous Reflux:  None. Other Findings: No evidence of superficial thrombophlebitis or abnormal fluid collection. LEFT LOWER EXTREMITY Common Femoral Vein: No evidence of thrombus. Normal compressibility, respiratory phasicity and response to augmentation. Saphenofemoral Junction: No evidence of thrombus. Normal compressibility and flow on color Doppler imaging. Profunda Femoral Vein: No evidence of thrombus. Normal compressibility and flow on color Doppler imaging. Femoral Vein: No evidence of thrombus. Normal compressibility, respiratory phasicity and response to augmentation. Popliteal Vein: No evidence of thrombus. Normal compressibility, respiratory phasicity and response to augmentation. Calf Veins: Limited evaluation of calf veins without evidence of thrombus. Superficial Great Saphenous Vein: No evidence of thrombus. Normal compressibility and flow on color Doppler imaging. Venous Reflux:  None. Other Findings: No evidence of superficial thrombophlebitis or abnormal fluid collection. IMPRESSION: No evidence of bilateral lower extremity deep venous thrombosis. Electronically Signed   By: Aletta Edouard M.D.    On: 11/07/2016 10:57    EKG: Independently reviewed.  Assessment/Plan Principal Problem:   Atrial fibrillation, rapid ([place]) Active Problems:   CAP (community acquired pneumonia)   Acute CHF (congestive heart failure) ([place])   Accelerated hypertension   Will admit to [facility] as FULL CODE on Cardizem drip. Begin po Coreg and NTP with IV Lasix. Begin IV ABX and SVN's. O2 as needed. Consult Cardiology and order echo. Follow enzymes. Repeat labs in AM.  Diet: low salt Fluids: saline lock DVT Prophylaxis: Lovenox  Code Status: FULL  Family Communication: yes  Disposition Plan: SNF  Time spent: 55 min

## 2016-11-07 NOTE — ED Provider Notes (Signed)
[facility] Emergency Department Provider Note  ____________________________________________  Time seen: Approximately 9:51 AM  I have reviewed the triage vital signs and the nursing notes.   HISTORY  Chief Complaint Shortness of Breath and generalized weakness   HPI [name] [name] is a 83 y.o. female the history of CHF, COPD, hypertension, hyperlipidemia, anemia who presents for evaluation of shortness of breath. Patient has had a week of generalized malaise and diarrhea. Yesterday evening she started to have shortness of breath that is both worse when laying flat and with minimal exertion. This morning she called her daughter saying that she was unable to sleep due to her severe shortness of breath and was brought into the emergency room. Patient does not use any inhalers for her COPD and she was wheezing last night according to her. She also noticed swelling of her right lower extremity which is painful for the last few days. This is new for her. Patient has had a humerus fracture and has been in a wheelchair for the last week. She has had a dry cough but no fever or chills, no abdominal pain, no chest pain, no vomiting, no dysuria.  Past Medical History:  Diagnosis Date  . Anemia   . CHF (congestive heart failure) ([place])   . Emphysema lung ([place])   . Emphysema of lung ([place])   . Hyperlipidemia   . Hypertension   . Neuropathy ([place])   . Osteoporosis   . Renal disorder     Patient Active Problem List   Diagnosis Date Noted  . Generalized anxiety disorder 09/26/2016  . PAD (peripheral artery disease) ([place]) 10/05/2013  . Chronic venous insufficiency 10/05/2013    Past Surgical History:  Procedure Laterality Date  . ABDOMINAL HYSTERECTOMY  1982  . CATARACT EXTRACTION    . CHOLECYSTECTOMY  1992  . JOINT REPLACEMENT  2005    Prior to Admission medications   Medication Sig Start Date End Date Taking? Authorizing Provider  acetaminophen (TYLENOL) 325  MG tablet Take 650 mg by mouth every 6 (six) hours as needed for mild pain or moderate pain.   Yes Historical Provider, MD  ALPRAZolam (XANAX) 0.25 MG tablet Take 0.25 mg by mouth 2 (two) times daily as needed for anxiety or agitation. 08/07/16  Yes Historical Provider, MD  aspirin 81 MG tablet Take 81 mg by mouth daily.   Yes Historical Provider, MD  carvedilol (COREG) 12.5 MG tablet Take 1 tablet (12.5 mg total) by mouth 2 (two) times daily. 10/06/16 10/06/17 Yes Schuyler Amor, MD  celecoxib (CELEBREX) 100 MG capsule Take 100 mg by mouth every other day as needed. 10/15/16  Yes Historical Provider, MD  Cholecalciferol 2000 units CAPS Take 4,000 Units by mouth daily.    Yes Historical Provider, MD  clopidogrel (PLAVIX) 75 MG tablet Take 1 tablet by mouth daily. 09/18/13  Yes Historical Provider, MD  cyanocobalamin 1000 MCG tablet Take 1,000 mcg by mouth daily.    Yes Historical Provider, MD  dicyclomine (BENTYL) 20 MG tablet Take 1 tablet (20 mg total) by mouth 3 (three) times daily as needed for spasms. 11/28/15  Yes Daymon Larsen, MD  FLECTOR 1.3 % PTCH Place 1 patch onto the skin every 12 (twelve) hours.  09/18/13  Yes Historical Provider, MD  gabapentin (NEURONTIN) 300 MG capsule Take 1 capsule by mouth 3 (three) times daily.  09/04/13  Yes Historical Provider, MD  loratadine (CLARITIN) 10 MG tablet Take 10 mg by mouth daily.  Yes Historical Provider, MD  nitroGLYCERIN (NITRODUR - DOSED IN MG/24 HR) 0.2 mg/hr patch Place 1 patch onto the skin daily.  09/18/13  Yes Historical Provider, MD  ondansetron (ZOFRAN) 4 MG tablet Take 1 tablet (4 mg total) by mouth every 8 (eight) hours as needed for nausea or vomiting. 11/28/15  Yes Daymon Larsen, MD  Probiotic Product (ALIGN) 4 MG CAPS Take 4 mg by mouth daily.   Yes Historical Provider, MD  ranitidine (ZANTAC) 150 MG capsule Take 150 mg by mouth 2 (two) times daily.   Yes Historical Provider, MD  sennosides-docusate sodium (SENOKOT-S) 8.6-50 MG tablet  Take 2 tablets by mouth daily.   Yes Historical Provider, MD  traMADol (ULTRAM) 50 MG tablet Take 50 mg by mouth every 4 (four) hours as needed for moderate pain or severe pain.   Yes Historical Provider, MD  furosemide (LASIX) 20 MG tablet Take 1 tablet (20 mg total) by mouth daily. Patient not taking: Reported on 11/07/2016 10/06/16   Schuyler Amor, MD    Allergies Morphine and related and Zoloft [sertraline hcl]  Family History  Problem Relation Age of Onset  . Colon cancer Sister     Social History Social History  Substance Use Topics  . Smoking status: Never Smoker  . Smokeless tobacco: Never Used  . Alcohol use No    Review of Systems  Constitutional: Negative for fever. + generalized weakness Eyes: Negative for visual changes. ENT: Negative for sore throat. Neck: No neck pain  Cardiovascular: Negative for chest pain. Respiratory: + shortness of breath, cough Gastrointestinal: Negative for abdominal pain, vomiting. + diarrhea. Genitourinary: Negative for dysuria. Musculoskeletal: Negative for back pain. Skin: Negative for rash. Neurological: Negative for headaches, weakness or numbness. Psych: No SI or HI  ____________________________________________   PHYSICAL EXAM:  VITAL SIGNS: ED Triage Vitals  Enc Vitals Group     BP 11/07/16 0806 (!) 117/116     Pulse Rate 11/07/16 0806 (!) 115     Resp 11/07/16 0819 20     Temp 11/07/16 0806 98.4 F (36.9 C)     Temp Source 11/07/16 0806 Oral     SpO2 11/07/16 0802 96 %     Weight 11/07/16 0807 160 lb (72.6 kg)     Height 11/07/16 0807 5\' 6"  (1.676 m)     Head Circumference --      Peak Flow --      Pain Score --      Pain Loc --      Pain Edu? --      Excl. in [place]? --     Constitutional: Alert and oriented. Well appearing and in no apparent distress. HEENT:      Head: Normocephalic and atraumatic.         Eyes: Conjunctivae are normal. Sclera is non-icteric. EOMI. PERRL      Mouth/Throat: Mucous  membranes are moist.       Neck: Supple with no signs of meningismus. Cardiovascular: Regularly irregular rhythm with a tachycardic rate. No murmurs, gallops, or rubs. 2+ symmetrical distal pulses are present in all extremities. No JVD. Respiratory: Normal respiratory effort. Diminished air movement on the bases with no wheezing or crackles  Gastrointestinal: Soft, non tender, and non distended with positive bowel sounds. No rebound or guarding. Genitourinary: No CVA tenderness. Musculoskeletal: There is pitting edema of bilateral lower extremities with right much bigger than left  Neurologic: Normal speech and language. Face is symmetric. Moving all extremities. No gross  focal neurologic deficits are appreciated. Skin: Skin is warm, dry and intact. No rash noted. Psychiatric: Mood and affect are normal. Speech and behavior are normal.  ____________________________________________   LABS (all labs ordered are listed, but only abnormal results are displayed)  Labs Reviewed  CBC WITH DIFFERENTIAL/PLATELET - Abnormal; Notable for the following:       Result Value   RBC 3.48 (*)    Hemoglobin 11.5 (*)    HCT 34.1 (*)    Lymphs Abs 0.7 (*)    All other components within normal limits  BASIC METABOLIC PANEL - Abnormal; Notable for the following:    Glucose, Bld 103 (*)    BUN 35 (*)    Creatinine, Ser 1.59 (*)    GFR calc non Af Amer 27 (*)    GFR calc Af Amer 32 (*)    All other components within normal limits  BRAIN NATRIURETIC PEPTIDE - Abnormal; Notable for the following:    B Natriuretic Peptide 991.0 (*)    All other components within normal limits  TROPONIN I - Abnormal; Notable for the following:    Troponin I 0.05 (*)    All other components within normal limits  URINALYSIS, COMPLETE (UACMP) WITH MICROSCOPIC - Abnormal; Notable for the following:    Color, Urine STRAW (*)    APPearance CLEAR (*)    Protein, ur 100 (*)    Bacteria, UA RARE (*)    Squamous Epithelial / LPF  0-5 (*)    All other components within normal limits  CULTURE, BLOOD (ROUTINE X 2)  CULTURE, BLOOD (ROUTINE X 2)  INFLUENZA PANEL BY PCR (TYPE A & B)   ____________________________________________  EKG  ED ECG REPORT I, Rudene Re, the attending physician, personally viewed and interpreted this ECG.  Atrial fibrillation, rate of 131, normal QTC, normal axis, no ST elevations, St depression on anterior and lateral leads.   ____________________________________________  RADIOLOGY  CXR: 1. New right pleural effusion. Probable mild edema. 2. More focal nodular opacity in the right base could represent atelectasis, fluid is in the fissure, or developing infiltrate. Recommend attention on follow-up. 3. Chronic left humeral fractures.  Doppler US: No evidence of bilateral lower extremity deep venous thrombosis ____________________________________________   PROCEDURES  Procedure(s) performed: None Procedures Critical Care performed:  Yes  CRITICAL CARE Performed by: Rudene Re  ?  Total critical care time: 35 min  Critical care time was exclusive of separately billable procedures and treating other patients.  Critical care was necessary to treat or prevent imminent or life-threatening deterioration.  Critical care was time spent personally by me on the following activities: development of treatment plan with patient and/or surrogate as well as nursing, discussions with consultants, evaluation of patient's response to treatment, examination of patient, obtaining history from patient or surrogate, ordering and performing treatments and interventions, ordering and review of laboratory studies, ordering and review of radiographic studies, pulse oximetry and re-evaluation of patient's condition.  ____________________________________________   INITIAL IMPRESSION / ASSESSMENT AND PLAN / ED COURSE  83 y.o. female the history of CHF, COPD, hypertension,  hyperlipidemia, anemia who presents for evaluation of shortness of breath in the setting of one week of generalized weakness, asymmetric leg swelling, and cough. Patient found to be in A. fib with ventricular rate between 110-130, has not received her dose of Coreg last night or this morning. We'll give a dose of IV metoprolol and give her her dose of morning dose of Coreg for rate control. This  is a new diagnosis for patient. She is on Plavix but no other blood thinners for A. fib. With asymmetric leg swelling most concerned for blood clots patiently since patient has been on a wheelchair. We'll do Dopplers. We'll get chest x-ray, CBC, BMP, BNP, troponin. Patient has normal work of breathing, normal oxygenation.     _________________________ 11:17 AM on 11/07/2016 -----------------------------------------  Chest x-ray concerning for pneumonia patient was given ceftriaxone and azithromycin. Also concerning for pulmonary edema and pleural effusions patient was given 40 mg of IV Lasix. Doppler studies negative for DVT. Patient was given her morning dose of Coreg and 5 mg of IV metoprolol with a rate now in the 110s. Will admit for further evaluation.   Pertinent labs & imaging results that were available during my care of the patient were reviewed by me and considered in my medical decision making (see chart for details).    ____________________________________________   FINAL CLINICAL IMPRESSION(S) / ED DIAGNOSES  Final diagnoses:  Community acquired pneumonia, unspecified laterality  Acute on chronic congestive heart failure, unspecified congestive heart failure type ([place])  Atrial fibrillation with RVR ([place])      NEW MEDICATIONS STARTED DURING THIS VISIT:  New Prescriptions   No medications on file     Note:  This document was prepared using Dragon voice recognition software and may include unintentional dictation errors.    Rudene Re, MD 11/07/16 1120

## 2016-11-08 ENCOUNTER — Inpatient Hospital Stay: Admit: 2016-11-08 | Payer: Medicare Other

## 2016-11-08 LAB — CBC
HCT: 30.8 % — ABNORMAL LOW (ref 35.0–47.0)
HEMOGLOBIN: 10.4 g/dL — AB (ref 12.0–16.0)
MCH: 32.9 pg (ref 26.0–34.0)
MCHC: 33.6 g/dL (ref 32.0–36.0)
MCV: 97.8 fL (ref 80.0–100.0)
PLATELETS: 148 10*3/uL — AB (ref 150–440)
RBC: 3.15 MIL/uL — AB (ref 3.80–5.20)
RDW: 14.5 % (ref 11.5–14.5)
WBC: 6.4 10*3/uL (ref 3.6–11.0)

## 2016-11-08 LAB — COMPREHENSIVE METABOLIC PANEL
ALK PHOS: 115 U/L (ref 38–126)
ALT: 9 U/L — AB (ref 14–54)
ANION GAP: 7 (ref 5–15)
AST: 17 U/L (ref 15–41)
Albumin: 3.2 g/dL — ABNORMAL LOW (ref 3.5–5.0)
BILIRUBIN TOTAL: 0.8 mg/dL (ref 0.3–1.2)
BUN: 33 mg/dL — ABNORMAL HIGH (ref 6–20)
CALCIUM: 9.3 mg/dL (ref 8.9–10.3)
CO2: 28 mmol/L (ref 22–32)
CREATININE: 1.65 mg/dL — AB (ref 0.44–1.00)
Chloride: 107 mmol/L (ref 101–111)
GFR calc non Af Amer: 26 mL/min — ABNORMAL LOW (ref >60)
GFR, EST AFRICAN AMERICAN: 30 mL/min — AB (ref >60)
Glucose, Bld: 117 mg/dL — ABNORMAL HIGH (ref 65–99)
Potassium: 3.4 mmol/L — ABNORMAL LOW (ref 3.5–5.1)
SODIUM: 142 mmol/L (ref 135–145)
TOTAL PROTEIN: 6.2 g/dL — AB (ref 6.5–8.1)

## 2016-11-08 LAB — GASTROINTESTINAL PANEL BY PCR, STOOL (REPLACES STOOL CULTURE)

## 2016-11-08 LAB — TROPONIN I: Troponin I: 0.06 ng/mL (ref <0.03)

## 2016-11-08 MED ORDER — FUROSEMIDE 10 MG/ML IJ SOLN
40.0000 mg | Freq: Every day | INTRAMUSCULAR | Status: DC
  Administered 2016-11-09: 40 mg via INTRAVENOUS
  Filled 2016-11-08: qty 4

## 2016-11-08 MED ORDER — IPRATROPIUM-ALBUTEROL 0.5-2.5 (3) MG/3ML IN SOLN: 3.0000 mL | RESPIRATORY_TRACT | Status: DC | PRN

## 2016-11-08 NOTE — Progress Notes (Signed)
Pt rested on and off throughout night. VSS. Incontinent at times. LBM 11/08/16. CO axiety and sinking feeling in her chest. Pt. Seems to be very anxious at times. +3 bilateral extremity edema wrapped with kerlex and coban. Remained in NSR with no more episodes of bradycardia.

## 2016-11-08 NOTE — Progress Notes (Signed)
[place] [facility] [place] NAME: [name] [name]    MR#:  [id number]  DATE OF BIRTH:  03/13/1923  SUBJECTIVE:  CHIEF COMPLAINT:   Chief Complaint  Patient presents with  . Shortness of Breath  . generalized weakness    Came with complaint of diarrhea for 1-2 days and palpitation and shortness of breath. Found to have A. fib with RVR.  Currently in normal sinus rhythm.  She denies having any more diarrhea since she is in hospital.   Influenza is negative.  REVIEW OF SYSTEMS:  CONSTITUTIONAL: No fever, fatigue or weakness.  EYES: No blurred or double vision.  EARS, NOSE, AND THROAT: No tinnitus or ear pain.  RESPIRATORY: No cough, shortness of breath, wheezing or hemoptysis.  CARDIOVASCULAR: No chest pain, orthopnea, edema.  GASTROINTESTINAL: No nausea, vomiting, diarrhea or abdominal pain.  GENITOURINARY: No dysuria, hematuria.  ENDOCRINE: No polyuria, nocturia,  HEMATOLOGY: No anemia, easy bruising or bleeding SKIN: No rash or lesion. MUSCULOSKELETAL: No joint pain or arthritis.   NEUROLOGIC: No tingling, numbness, weakness.  PSYCHIATRY: No anxiety or depression.   ROS  DRUG ALLERGIES:   Allergies  Allergen Reactions  . Morphine And Related     Heart problems   . Zoloft [Sertraline Hcl] Swelling    VITALS:  Blood pressure (!) 165/75, pulse 73, temperature 98.8 F (37.1 C), resp. rate 18, height 5\' 6"  (1.676 m), weight 72.6 kg (160 lb), SpO2 93 %.  PHYSICAL EXAMINATION:  GENERAL:  83 y.o.-year-old patient lying in the bed with no acute distress.  EYES: Pupils equal, round, reactive to light and accommodation. No scleral icterus. Extraocular muscles intact.  HEENT: Head atraumatic, normocephalic. Oropharynx and nasopharynx clear.  NECK:  Supple, no jugular venous distention. No thyroid enlargement, no tenderness.  LUNGS: Normal breath sounds bilaterally, no wheezing, rales,rhonchi or crepitation. No use of accessory muscles of  respiration.  CARDIOVASCULAR: S1, S2 normal. No murmurs, rubs, or gallops.  ABDOMEN: Soft, nontender, nondistended. Bowel sounds present. No organomegaly or mass.  EXTREMITIES: No pedal edema, cyanosis, or clubbing.  NEUROLOGIC: Cranial nerves II through XII are intact. Muscle strength 5/5 in all extremities. Sensation intact. Gait not checked.  PSYCHIATRIC: The patient is alert and oriented x 3.  SKIN: No obvious rash, lesion, or ulcer.   Physical Exam LABORATORY PANEL:   CBC  Recent Labs Lab 11/08/16 0423  WBC 6.4  HGB 10.4*  HCT 30.8*  PLT 148*   ------------------------------------------------------------------------------------------------------------------  Chemistries   Recent Labs Lab 11/08/16 0423  NA 142  K 3.4*  CL 107  CO2 28  GLUCOSE 117*  BUN 33*  CREATININE 1.65*  CALCIUM 9.3  AST 17  ALT 9*  ALKPHOS 115  BILITOT 0.8   ------------------------------------------------------------------------------------------------------------------  Cardiac Enzymes  Recent Labs Lab 11/07/16 2129 11/08/16 0423  TROPONINI 0.07* 0.06*   ------------------------------------------------------------------------------------------------------------------  RADIOLOGY:  Dg Chest 2 View  Result Date: 11/07/2016 CLINICAL DATA:  Shortness of breath. EXAM: CHEST  2 VIEW COMPARISON:  October 05, 2016 FINDINGS: New right pleural effusion with underlying atelectasis. Increased opacity in the bases with an interstitial component. There is a more focal nodular region of opacity projected over the right base, not seen 1 month ago. No change in cardiomegaly. The study is otherwise stable. Chronic left humeral fracture. IMPRESSION: 1. New right pleural effusion.  Probable mild edema. 2. More focal nodular opacity in the right base could represent atelectasis, fluid is in the fissure, or developing infiltrate. Recommend attention on  follow-up. 3. Chronic left humeral fractures.  Electronically Signed   By: Dorise Bullion III M.D   On: 11/07/2016 09:13   US Venous Img Lower Bilateral  Result Date: 11/07/2016 CLINICAL DATA:  Bilateral lower extremity edema, left greater than right with associated pain. EXAM: BILATERAL LOWER EXTREMITY VENOUS DOPPLER ULTRASOUND TECHNIQUE: Gray-scale sonography with graded compression, as well as color Doppler and duplex ultrasound were performed to evaluate the lower extremity deep venous systems from the level of the common femoral vein and including the common femoral, femoral, profunda femoral, popliteal and calf veins including the posterior tibial, peroneal and gastrocnemius veins when visible. The superficial great saphenous vein was also interrogated. Spectral Doppler was utilized to evaluate flow at rest and with distal augmentation maneuvers in the common femoral, femoral and popliteal veins. COMPARISON:  None. FINDINGS: RIGHT LOWER EXTREMITY Common Femoral Vein: No evidence of thrombus. Normal compressibility, respiratory phasicity and response to augmentation. Saphenofemoral Junction: No evidence of thrombus. Normal compressibility and flow on color Doppler imaging. Profunda Femoral Vein: No evidence of thrombus. Normal compressibility and flow on color Doppler imaging. Femoral Vein: No evidence of thrombus. Normal compressibility, respiratory phasicity and response to augmentation. Popliteal Vein: No evidence of thrombus. Normal compressibility, respiratory phasicity and response to augmentation. Calf Veins: Limited evaluation of calf veins without evidence of thrombus. Superficial Great Saphenous Vein: No evidence of thrombus. Normal compressibility and flow on color Doppler imaging. Venous Reflux:  None. Other Findings: No evidence of superficial thrombophlebitis or abnormal fluid collection. LEFT LOWER EXTREMITY Common Femoral Vein: No evidence of thrombus. Normal compressibility, respiratory phasicity and response to augmentation.  Saphenofemoral Junction: No evidence of thrombus. Normal compressibility and flow on color Doppler imaging. Profunda Femoral Vein: No evidence of thrombus. Normal compressibility and flow on color Doppler imaging. Femoral Vein: No evidence of thrombus. Normal compressibility, respiratory phasicity and response to augmentation. Popliteal Vein: No evidence of thrombus. Normal compressibility, respiratory phasicity and response to augmentation. Calf Veins: Limited evaluation of calf veins without evidence of thrombus. Superficial Great Saphenous Vein: No evidence of thrombus. Normal compressibility and flow on color Doppler imaging. Venous Reflux:  None. Other Findings: No evidence of superficial thrombophlebitis or abnormal fluid collection. IMPRESSION: No evidence of bilateral lower extremity deep venous thrombosis. Electronically Signed   By: Aletta Edouard M.D.   On: 11/07/2016 10:57    ASSESSMENT AND PLAN:   Principal Problem:   Atrial fibrillation, rapid ([place]) Active Problems:   CAP (community acquired pneumonia)   Acute CHF (congestive heart failure) ([place])   Accelerated hypertension  * Atrial fibrillation with rapid ventricular response   On oral carvedilol and Cardizem, converted to normal sinus rhythm.   Get echocardiogram, cardiology consult.   TSH is normal.  * Acute CHF   On IV Lasix twice a day.   Follow-up echocardiogram.   Currently not much crepitation, change Lasix to once daily.   * Community-acquired pneumonia   Influenza test is negative, ceftriaxone and azithromycin.  * Complain of diarrhea   Now getting better, may be viral.   We will get GI panel.  * Accelerated hypertension   Was present on admission, currently stable on oral Coreg, Cardizem, Lasix.      All the records are reviewed and case discussed with Care Management/Social Workerr. Management plans discussed with the patient, family and they are in agreement.  CODE STATUS: DO NOT RESUSCITATE   TOTAL  TIME TAKING CARE OF THIS PATIENT: 40  minutes.     POSSIBLE D/C  IN 1-2 DAYS, DEPENDING ON CLINICAL CONDITION.   [name] M.D on 11/08/2016   Between 7am to 6pm - Pager - [phone]  After 6pm go to [email or web address] - password [email or web address] [facility]  Office  [phone]  CC: Primary care physician; Cletis Athens, MD  Note: This dictation was prepared with Dragon dictation along with smaller phrase technology. Any transcriptional errors that result from this process are unintentional.

## 2016-11-08 NOTE — Progress Notes (Signed)
1500 Report called to Vicenta Dunning RN on 2A

## 2016-11-08 NOTE — Evaluation (Signed)
Physical Therapy Evaluation Patient Details Name: [name] [name] MRN: [id number] DOB: 1923-01-09 Today's Date: 11/08/2016   History of Present Illness  Pt is a 83 y.o. female presenting to hospital with SOB, generalized weakness, and chest tightness.  Pt admitted to hospital with rapid a-fib, CAP, acute CHF, and htn.  PMH includes chronic L humeral fx's, CHF, COPD, htn, anemia.  Clinical Impression  Prior to hospital admission, pt was mostly using manual w/c for mobility but needed to ambulate to bathroom (d/t w/c not fitting in bathroom) and used walker with dowel bar attached d/t pt only able to use R UE d/t chronic L UE fx's.  Pt lives at [facility] and prior to admission pt mobilized modified independently but will call for assist if needed.  Currently pt is CGA supine to sit and min assist with transfer bed to recliner.  Pt would benefit from skilled PT to address noted impairments and functional limitations.  Recommend pt discharge back to facility with 24/7 assist for functional mobility and HHPT when medically appropriate.    Follow Up Recommendations Home health PT;Supervision/Assistance - 24 hour    Equipment Recommendations   (pt uses RW with dowel bar attached to walk)    Recommendations for Other Services       Precautions / Restrictions Precautions Precautions: Fall Required Braces or Orthoses: Sling;Other Brace/Splint Other Brace/Splint: Hard brace L UE  Restrictions Weight Bearing Restrictions: Yes LUE Weight Bearing: Non weight bearing      Mobility  Bed Mobility Overal bed mobility: Needs Assistance Bed Mobility: Supine to Sit     Supine to sit: Min guard;HOB elevated     General bed mobility comments: increased effort and time to perform (get up on R side off bed)  Transfers Overall transfer level: Needs assistance Equipment used: None Transfers: Sit to/from [facility] Sit to Stand: Min guard;Min assist Stand pivot transfers: Min  assist       General transfer comment: assist to steady taking steps bed to recliner (pt holding onto recliner arms with R UE to steady herself); increased time to perform  Ambulation/Gait             General Gait Details: Pt's daughter reporting hemi-walker does not work well for pt and needed walker with dowel bar attached to walk  Stairs            Wheelchair Mobility    Modified Rankin (Stroke Patients Only)       Balance Overall balance assessment: Needs assistance Sitting-balance support: Single extremity supported;Feet unsupported Sitting balance-Leahy Scale: Fair     Standing balance support: Single extremity supported;During functional activity Standing balance-Leahy Scale: Fair Standing balance comment: transfer to chair                             Pertinent Vitals/Pain Pain Assessment: No/denies pain  Vitals (HR and O2 on room air) stable and WFL throughout treatment session.    Home Living Family/patient expects to be discharged to:: Assisted living               Home Equipment:  (RW with dowel bar tied across (for single UE use/support)) Additional Comments: Pt lives at [facility].    Prior Function Level of Independence: Needs assistance   Gait / Transfers Assistance Needed: Is able to transfer, walk short distances to bathroom (with RW with dowel bar tied across), and propel w/c (with R UE and B  LE's) modified independently.  Mostly uses w/c but needs to walk to get into bathroom (w/c does not fit)  ADL's / Homemaking Assistance Needed: Assist for bathing, dressing, and medications  Comments: Pt reports 1 fall in past 6 months (Nov 1st 2017 pt fell breaking L humerus in 2 places).  Wears L glove for edema.     Hand Dominance        Extremity/Trunk Assessment   Upper Extremity Assessment Upper Extremity Assessment: LUE deficits/detail;RUE deficits/detail RUE Deficits / Details: generalized weakness LUE Deficits /  Details: Defered d/t chronic L humeral fx's (in L shoulder immobilizer d/t fx's) LUE: Unable to fully assess due to immobilization    Lower Extremity Assessment Lower Extremity Assessment: Generalized weakness       Communication   Communication: HOH  Cognition Arousal/Alertness: Awake/alert Behavior During Therapy: WFL for tasks assessed/performed Overall Cognitive Status: Within Functional Limits for tasks assessed                      General Comments General comments (skin integrity, edema, etc.): Pt's daughter present during session.  Nursing cleared pt for participation in physical therapy.  Pt agreeable to PT session.    Exercises General Exercises - Lower Extremity Ankle Circles/Pumps: AROM;Strengthening;Both;20 reps;Seated Long Arc Quad: AROM;Strengthening;Both;20 reps;Seated Hip Flexion/Marching: AROM;Strengthening;Both;20 reps;Seated   Assessment/Plan    PT Assessment Patient needs continued PT services  PT Problem List Decreased strength;Decreased balance;Decreased mobility          PT Treatment Interventions DME instruction;Gait training;Functional mobility training;Therapeutic activities;Therapeutic exercise;Balance training;Patient/family education    PT Goals (Current goals can be found in the Care Plan section)  Acute Rehab PT Goals Patient Stated Goal: to be able to walk again PT Goal Formulation: With patient Time For Goal Achievement: 11/22/16 Potential to Achieve Goals: Fair    Frequency Min 2X/week   Barriers to discharge        Co-evaluation               End of Session Equipment Utilized During Treatment: Gait belt Activity Tolerance: Patient limited by fatigue Patient left: in chair;with call bell/phone within reach;with chair alarm set;with family/visitor present Nurse Communication: Mobility status;Precautions;Weight bearing status (BP increased from 131/56 at rest to 154/73 with activity)         Time: 1100-1130 PT  Time Calculation (min) (ACUTE ONLY): 30 min   Charges:   PT Evaluation $PT Eval Low Complexity: 1 Procedure PT Treatments $Therapeutic Exercise: 8-22 mins   PT G CodesLeitha Bleak 2016-11-16, 11:48 AM Leitha Bleak, [place]

## 2016-11-08 NOTE — Progress Notes (Signed)
1545 Back to bed with help and transferred via bed to room 235 - 2A

## 2016-11-08 NOTE — Consult Note (Signed)
Reason for Consult: Atrial fibrillation paroxysmal congestive heart failure Referring Physician: Dr. Doy Hutching hospitalist, Dr. Lavera Guise primary  [name] [name] is an 83 y.o. female.  HPI: Patient presents with rapid atrial fibrillation after not feeling well for the last several days complains of weakness fatigue shortness of breath palpitations. Patient has had multiple falls recently including injury to her left shoulder. Patient states she felt so poorly she thought she was comment I notice dyspnea palpitations tachycardia while seen in emergency room she was found to be in rapid atrial fibrillation chest x-ray also suggested possible pneumonia since she was admitted for further evaluation and management  Past Medical History:  Diagnosis Date  . Anemia   . CHF (congestive heart failure) ([place])   . Coronary artery disease   . Emphysema lung ([employer] [facility])   . Emphysema of lung ([place])   . GERD (gastroesophageal reflux disease)   . Hyperlipidemia   . Hypertension   . Neuropathy ([place])   . Osteoporosis   . Peripheral vascular disease ([place])   . Renal disorder   . Stroke [facility])     Past Surgical History:  Procedure Laterality Date  . ABDOMINAL HYSTERECTOMY  1982  . CATARACT EXTRACTION    . CHOLECYSTECTOMY  1992  . JOINT REPLACEMENT  2005    Family History  Problem Relation Age of Onset  . Colon cancer Sister     Social History:  reports that she has never smoked. She has never used smokeless tobacco. She reports that she does not drink alcohol or use drugs.  Allergies:  Allergies  Allergen Reactions  . Morphine And Related     Heart problems   . Zoloft [Sertraline Hcl] Swelling    Medications: I have reviewed the patient's current medications.  Results for orders placed or performed during the hospital encounter of 11/07/16 (from the past 48 hour(s))  CBC with Differential/Platelet     Status: Abnormal   Collection Time: 11/07/16  9:09 AM  Result Value Ref Range   WBC 8.0 3.6 - 11.0  K/uL   RBC 3.48 (L) 3.80 - 5.20 MIL/uL   Hemoglobin 11.5 (L) 12.0 - 16.0 g/dL   HCT 34.1 (L) 35.0 - 47.0 %   MCV 98.1 80.0 - 100.0 fL   MCH 33.0 26.0 - 34.0 pg   MCHC 33.7 32.0 - 36.0 g/dL   RDW 14.3 11.5 - 14.5 %   Platelets 156 150 - 440 K/uL   Neutrophils Relative % 78 %   Neutro Abs 6.3 1.4 - 6.5 K/uL   Lymphocytes Relative 9 %   Lymphs Abs 0.7 (L) 1.0 - 3.6 K/uL   Monocytes Relative 8 %   Monocytes Absolute 0.7 0.2 - 0.9 K/uL   Eosinophils Relative 4 %   Eosinophils Absolute 0.3 0 - 0.7 K/uL   Basophils Relative 1 %   Basophils Absolute 0.0 0 - 0.1 K/uL  Basic metabolic panel     Status: Abnormal   Collection Time: 11/07/16  9:09 AM  Result Value Ref Range   Sodium 141 135 - 145 mmol/L   Potassium 4.6 3.5 - 5.1 mmol/L   Chloride 109 101 - 111 mmol/L   CO2 25 22 - 32 mmol/L   Glucose, Bld 103 (H) 65 - 99 mg/dL   BUN 35 (H) 6 - 20 mg/dL   Creatinine, Ser 1.59 (H) 0.44 - 1.00 mg/dL   Calcium 9.8 8.9 - 10.3 mg/dL   GFR calc non Af Amer 27 (L) >60 mL/min  GFR calc Af Amer 32 (L) >60 mL/min    Comment: (NOTE) The eGFR has been calculated using the CKD EPI equation. This calculation has not been validated in all clinical situations. eGFR's persistently <60 mL/min signify possible Chronic Kidney Disease.    Anion gap 7 5 - 15  Brain natriuretic peptide     Status: Abnormal   Collection Time: 11/07/16  9:09 AM  Result Value Ref Range   B Natriuretic Peptide 991.0 (H) 0.0 - 100.0 pg/mL  Troponin I     Status: Abnormal   Collection Time: 11/07/16  9:09 AM  Result Value Ref Range   Troponin I 0.05 (HH) <0.03 ng/mL    Comment: CRITICAL RESULT CALLED TO, READ BACK BY AND VERIFIED WITH VALERIE CHANDLER_0  ON 11/07/16 BY HKP   Urinalysis, Complete w Microscopic     Status: Abnormal   Collection Time: 11/07/16  9:09 AM  Result Value Ref Range   Color, Urine STRAW (A) YELLOW   APPearance CLEAR (A) CLEAR   Specific Gravity, Urine 1.008 1.005 - 1.030   pH 5.0 5.0 - 8.0    Glucose, UA NEGATIVE NEGATIVE mg/dL   Hgb urine dipstick NEGATIVE NEGATIVE   Bilirubin Urine NEGATIVE NEGATIVE   Ketones, ur NEGATIVE NEGATIVE mg/dL   Protein, ur 100 (A) NEGATIVE mg/dL   Nitrite NEGATIVE NEGATIVE   Leukocytes, UA NEGATIVE NEGATIVE   RBC / HPF 0-5 0 - 5 RBC/hpf   WBC, UA 0-5 0 - 5 WBC/hpf   Bacteria, UA RARE (A) NONE SEEN   Squamous Epithelial / LPF 0-5 (A) NONE SEEN  Influenza panel by PCR (type A & B)     Status: None   Collection Time: 11/07/16  9:09 AM  Result Value Ref Range   Influenza A By PCR NEGATIVE NEGATIVE   Influenza B By PCR NEGATIVE NEGATIVE    Comment: (NOTE) The Xpert Xpress Flu assay is intended as an aid in the diagnosis of  influenza and should not be used as a sole basis for treatment.  This  assay is FDA approved for nasopharyngeal swab specimens only. Nasal  washings and aspirates are unacceptable for Xpert Xpress Flu testing.   CULTURE, BLOOD (ROUTINE X 2) w Reflex to ID Panel     Status: None (Preliminary result)   Collection Time: 11/07/16 11:05 AM  Result Value Ref Range   Specimen Description BLOOD RIGHT ARM    Special Requests BOTTLES DRAWN AEROBIC AND ANAEROBIC 11ML    Culture NO GROWTH < 24 HOURS    Report Status PENDING   CULTURE, BLOOD (ROUTINE X 2) w Reflex to ID Panel     Status: None (Preliminary result)   Collection Time: 11/07/16 11:05 AM  Result Value Ref Range   Specimen Description BLOOD RIGHT HAND    Special Requests BOTTLES DRAWN AEROBIC AND ANAEROBIC 10ML    Culture NO GROWTH < 24 HOURS    Report Status PENDING   Glucose, capillary     Status: Abnormal   Collection Time: 11/07/16  1:58 PM  Result Value Ref Range   Glucose-Capillary 104 (H) 65 - 99 mg/dL  TSH     Status: None   Collection Time: 11/07/16  2:45 PM  Result Value Ref Range   TSH 2.726 0.350 - 4.500 uIU/mL    Comment: Performed by a 3rd Generation assay with a functional sensitivity of <=0.01 uIU/mL.  Troponin I     Status: Abnormal   Collection  Time: 11/07/16  2:45 PM  Result Value Ref Range   Troponin I 0.06 (HH) <0.03 ng/mL    Comment: CRITICAL VALUE NOTED. VALUE IS CONSISTENT WITH PREVIOUSLY REPORTED/CALLED VALUE. [place].  MRSA PCR Screening     Status: None   Collection Time: 11/07/16  3:40 PM  Result Value Ref Range   MRSA by PCR NEGATIVE NEGATIVE    Comment:        The GeneXpert MRSA Assay (FDA approved for NASAL specimens only), is one component of a comprehensive MRSA colonization surveillance program. It is not intended to diagnose MRSA infection nor to guide or monitor treatment for MRSA infections.   C difficile quick scan w PCR reflex     Status: None   Collection Time: 11/07/16  4:20 PM  Result Value Ref Range   C Diff antigen NEGATIVE NEGATIVE   C Diff toxin NEGATIVE NEGATIVE   C Diff interpretation No C. difficile detected.   Troponin I     Status: Abnormal   Collection Time: 11/07/16  9:29 PM  Result Value Ref Range   Troponin I 0.07 (HH) <0.03 ng/mL    Comment: CRITICAL VALUE NOTED. VALUE IS CONSISTENT WITH PREVIOUSLY REPORTED/CALLED VALUE / [place]  Troponin I     Status: Abnormal   Collection Time: 11/08/16  4:23 AM  Result Value Ref Range   Troponin I 0.06 (HH) <0.03 ng/mL    Comment: CRITICAL VALUE NOTED. VALUE IS CONSISTENT WITH PREVIOUSLY REPORTED/CALLED VALUE [facility]   Comprehensive metabolic panel     Status: Abnormal   Collection Time: 11/08/16  4:23 AM  Result Value Ref Range   Sodium 142 135 - 145 mmol/L   Potassium 3.4 (L) 3.5 - 5.1 mmol/L   Chloride 107 101 - 111 mmol/L   CO2 28 22 - 32 mmol/L   Glucose, Bld 117 (H) 65 - 99 mg/dL   BUN 33 (H) 6 - 20 mg/dL   Creatinine, Ser 1.65 (H) 0.44 - 1.00 mg/dL   Calcium 9.3 8.9 - 10.3 mg/dL   Total Protein 6.2 (L) 6.5 - 8.1 g/dL   Albumin 3.2 (L) 3.5 - 5.0 g/dL   AST 17 15 - 41 U/L   ALT 9 (L) 14 - 54 U/L   Alkaline Phosphatase 115 38 - 126 U/L   Total Bilirubin 0.8 0.3 - 1.2 mg/dL   GFR calc non Af Amer 26 (L) >60 mL/min   GFR calc Af Amer 30  (L) >60 mL/min    Comment: (NOTE) The eGFR has been calculated using the CKD EPI equation. This calculation has not been validated in all clinical situations. eGFR's persistently <60 mL/min signify possible Chronic Kidney Disease.    Anion gap 7 5 - 15  CBC     Status: Abnormal   Collection Time: 11/08/16  4:23 AM  Result Value Ref Range   WBC 6.4 3.6 - 11.0 K/uL   RBC 3.15 (L) 3.80 - 5.20 MIL/uL   Hemoglobin 10.4 (L) 12.0 - 16.0 g/dL   HCT 30.8 (L) 35.0 - 47.0 %   MCV 97.8 80.0 - 100.0 fL   MCH 32.9 26.0 - 34.0 pg   MCHC 33.6 32.0 - 36.0 g/dL   RDW 14.5 11.5 - 14.5 %   Platelets 148 (L) 150 - 440 K/uL    Dg Chest 2 View  Result Date: 11/07/2016 CLINICAL DATA:  Shortness of breath. EXAM: CHEST  2 VIEW COMPARISON:  October 05, 2016 FINDINGS: New right pleural effusion with underlying atelectasis. Increased opacity in the bases with an  interstitial component. There is a more focal nodular region of opacity projected over the right base, not seen 1 month ago. No change in cardiomegaly. The study is otherwise stable. Chronic left humeral fracture. IMPRESSION: 1. New right pleural effusion.  Probable mild edema. 2. More focal nodular opacity in the right base could represent atelectasis, fluid is in the fissure, or developing infiltrate. Recommend attention on follow-up. 3. Chronic left humeral fractures. Electronically Signed   By: Dorise Bullion III M.D   On: 11/07/2016 09:13   US Venous Img Lower Bilateral  Result Date: 11/07/2016 CLINICAL DATA:  Bilateral lower extremity edema, left greater than right with associated pain. EXAM: BILATERAL LOWER EXTREMITY VENOUS DOPPLER ULTRASOUND TECHNIQUE: Gray-scale sonography with graded compression, as well as color Doppler and duplex ultrasound were performed to evaluate the lower extremity deep venous systems from the level of the common femoral vein and including the common femoral, femoral, profunda femoral, popliteal and calf veins including the  posterior tibial, peroneal and gastrocnemius veins when visible. The superficial great saphenous vein was also interrogated. Spectral Doppler was utilized to evaluate flow at rest and with distal augmentation maneuvers in the common femoral, femoral and popliteal veins. COMPARISON:  None. FINDINGS: RIGHT LOWER EXTREMITY Common Femoral Vein: No evidence of thrombus. Normal compressibility, respiratory phasicity and response to augmentation. Saphenofemoral Junction: No evidence of thrombus. Normal compressibility and flow on color Doppler imaging. Profunda Femoral Vein: No evidence of thrombus. Normal compressibility and flow on color Doppler imaging. Femoral Vein: No evidence of thrombus. Normal compressibility, respiratory phasicity and response to augmentation. Popliteal Vein: No evidence of thrombus. Normal compressibility, respiratory phasicity and response to augmentation. Calf Veins: Limited evaluation of calf veins without evidence of thrombus. Superficial Great Saphenous Vein: No evidence of thrombus. Normal compressibility and flow on color Doppler imaging. Venous Reflux:  None. Other Findings: No evidence of superficial thrombophlebitis or abnormal fluid collection. LEFT LOWER EXTREMITY Common Femoral Vein: No evidence of thrombus. Normal compressibility, respiratory phasicity and response to augmentation. Saphenofemoral Junction: No evidence of thrombus. Normal compressibility and flow on color Doppler imaging. Profunda Femoral Vein: No evidence of thrombus. Normal compressibility and flow on color Doppler imaging. Femoral Vein: No evidence of thrombus. Normal compressibility, respiratory phasicity and response to augmentation. Popliteal Vein: No evidence of thrombus. Normal compressibility, respiratory phasicity and response to augmentation. Calf Veins: Limited evaluation of calf veins without evidence of thrombus. Superficial Great Saphenous Vein: No evidence of thrombus. Normal compressibility and flow  on color Doppler imaging. Venous Reflux:  None. Other Findings: No evidence of superficial thrombophlebitis or abnormal fluid collection. IMPRESSION: No evidence of bilateral lower extremity deep venous thrombosis. Electronically Signed   By: Aletta Edouard M.D.   On: 11/07/2016 10:57    Review of Systems  Constitutional: Positive for malaise/fatigue and weight loss.  HENT: Positive for congestion and sinus pain.   Eyes: Negative.   Respiratory: Positive for cough and shortness of breath.   Cardiovascular: Positive for palpitations, orthopnea, leg swelling and PND.  Gastrointestinal: Negative.   Genitourinary: Negative.   Musculoskeletal: Positive for falls and myalgias.  Skin: Negative.   Neurological: Positive for weakness.  Endo/Heme/Allergies: Negative.   Psychiatric/Behavioral: Negative.    Blood pressure (!) 144/59, pulse 65, temperature 97.6 F (36.4 C), temperature source Oral, resp. rate 20, height 5' 6" (1.676 m), weight 72.6 kg (160 lb), SpO2 97 %. Physical Exam  Nursing note and vitals reviewed. Constitutional: She is oriented to person, place, and time. She appears well-developed.  HENT:  Head: Normocephalic and atraumatic.  Eyes: Conjunctivae and EOM are normal. Pupils are equal, round, and reactive to light.  Neck: Normal range of motion. Neck supple.  Cardiovascular: Normal rate and regular rhythm.   Murmur heard. Respiratory: Effort normal and breath sounds normal.  GI: Soft. Bowel sounds are normal.  Musculoskeletal: Normal range of motion. She exhibits edema.  Neurological: She is alert and oriented to person, place, and time. She has normal reflexes.  Skin: Skin is warm and dry.  Psychiatric: She has a normal mood and affect.    Assessment/Plan: Atrial fibrillation paroxysmal Acute on chronic Congestive heart failure Hypertension Shortness of breath dyspnea COPD Hyperlipidemia Renal insufficiency Community-acquired  pneumonia Cough Angina Neuropathy Multiple falls Fractured left shoulder . PLAN Agree with admission for atrial fibrillation Rule out myocardial infarction Wean IV Cardizem switched to by mouth Continue beta-blockade therapy for hypertension rate control Agree with antibiotic therapy for community acquired pneumonia Peripheral vascular disease continue aspirin therapy Chronic renal insufficiency follow-up with nephrology Continue Xanax therapy for anxiety symptoms Agree with supplemental oxygen therapy for shortness of breath and dyspnea Consider echocardiogram for assessment of acute on chronic congestive heart failure    D  11/08/2016, 4:15 PM

## 2016-11-09 ENCOUNTER — Inpatient Hospital Stay
Admit: 2016-11-09 | Discharge: 2016-11-09 | Disposition: A | Discharge status: Home / Self Care | Payer: Medicare Other | Attending: Internal Medicine | Admitting: Internal Medicine

## 2016-11-09 LAB — BASIC METABOLIC PANEL
ANION GAP: 9 (ref 5–15)
BUN: 33 mg/dL — AB (ref 6–20)
CO2: 29 mmol/L (ref 22–32)
Calcium: 9 mg/dL (ref 8.9–10.3)
Chloride: 103 mmol/L (ref 101–111)
Creatinine, Ser: 1.6 mg/dL — ABNORMAL HIGH (ref 0.44–1.00)
GFR calc Af Amer: 31 mL/min — ABNORMAL LOW (ref >60)
GFR, EST NON AFRICAN AMERICAN: 27 mL/min — AB (ref >60)
GLUCOSE: 99 mg/dL (ref 65–99)
POTASSIUM: 3.3 mmol/L — AB (ref 3.5–5.1)
Sodium: 141 mmol/L (ref 135–145)

## 2016-11-09 LAB — ECHOCARDIOGRAM COMPLETE
HEIGHTINCHES: 66 in
Weight: 2417.6 oz

## 2016-11-09 MED ORDER — POTASSIUM CHLORIDE CRYS ER 20 MEQ PO TBCR
40.0000 meq | EXTENDED_RELEASE_TABLET | Freq: Every day | ORAL | Status: DC
  Administered 2016-11-09: 40 meq via ORAL
  Filled 2016-11-09: qty 2

## 2016-11-09 MED ORDER — AZITHROMYCIN 500 MG PO TABS: 500.0000 mg | ORAL_TABLET | Freq: Every day | ORAL | 0 refills | Status: AC

## 2016-11-09 MED ORDER — DILTIAZEM HCL ER COATED BEADS 240 MG PO CP24
240.0000 mg | ORAL_CAPSULE | Freq: Every day | ORAL | Status: DC
  Administered 2016-11-09: 240 mg via ORAL
  Filled 2016-11-09: qty 1

## 2016-11-09 MED ORDER — DILTIAZEM HCL ER COATED BEADS 240 MG PO CP24: 240.0000 mg | ORAL_CAPSULE | Freq: Every day | ORAL | 0 refills | Status: DC

## 2016-11-09 MED ORDER — CEFUROXIME AXETIL 250 MG PO TABS: 250.0000 mg | ORAL_TABLET | Freq: Two times a day (BID) | ORAL | 0 refills | Status: AC

## 2016-11-09 MED ORDER — POTASSIUM CHLORIDE CRYS ER 10 MEQ PO TBCR: 10.0000 meq | EXTENDED_RELEASE_TABLET | Freq: Every day | ORAL | 0 refills | Status: AC

## 2016-11-09 NOTE — NC FL2 (Signed)
[place] LEVEL OF CARE SCREENING TOOL     IDENTIFICATION  Patient Name: [name] [name] Birthdate: September 19, 1923 Sex: female Admission Date (Current Location): 11/07/2016  [place] and [place] Number:  Engineering geologist and Address:  [facility], [address] [place], [place] [address]      Provider Number: [id number]  Attending Physician Name and Address:  [name], MD  Relative Name and Phone Number:  Sheilah Pigeon Daughter [phone]     Current Level of Care: Hospital Recommended Level of Care: [place] ([place]) Prior Approval Number:    Date Approved/Denied:   PASRR Number:    Discharge Plan: Domiciliary (Rest home) ([facility])    Current Diagnoses: Patient Active Problem List   Diagnosis Date Noted  . Atrial fibrillation, rapid ([place]) 11/07/2016  . CAP (community acquired pneumonia) 11/07/2016  . Acute CHF (congestive heart failure) ([place]) 11/07/2016  . Accelerated hypertension 11/07/2016  . Generalized anxiety disorder 09/26/2016  . PAD (peripheral artery disease) ([place]) 10/05/2013  . Chronic venous insufficiency 10/05/2013    Orientation RESPIRATION BLADDER Height & Weight     Self, Time, Situation, Place  Normal Continent Weight: 151 lb 1.6 oz (68.5 kg) Height:  5\' 6"  ([id number] cm)  BEHAVIORAL SYMPTOMS/MOOD NEUROLOGICAL BOWEL NUTRITION STATUS      Continent Diet (2G sodium diet)  AMBULATORY STATUS COMMUNICATION OF NEEDS Skin   Supervision Verbally Normal                       Personal Care Assistance Level of Assistance  Bathing, Feeding, Dressing Bathing Assistance: Limited assistance Feeding assistance: Independent Dressing Assistance: Limited assistance     Functional Limitations Info  Sight, Hearing, Speech Sight Info: Adequate Hearing Info: Adequate Speech Info: Adequate    SPECIAL CARE FACTORS FREQUENCY  PT (By licensed PT)     PT Frequency: 2x a week with  home health              Contractures Contractures Info: Not present    Additional Factors Info  Code Status, Allergies Code Status Info: DNR Allergies Info: MORPHINE AND RELATED, ZOLOFT SERTRALINE HCL            Current Medications (11/09/2016):  This is the current hospital active medication list Current Facility-Administered Medications  Medication Dose Route Frequency Provider Last Rate Last Dose  . 0.9 %  sodium chloride infusion  250 mL Intravenous PRN Idelle Crouch, MD      . acetaminophen (TYLENOL) tablet 650 mg  650 mg Oral Q6H PRN Idelle Crouch, MD   650 mg at 11/08/16 0144   Or  . acetaminophen (TYLENOL) suppository 650 mg  650 mg Rectal Q6H PRN Idelle Crouch, MD      . ALPRAZolam Duanne Moron) tablet 0.25 mg  0.25 mg Oral BID PRN Idelle Crouch, MD   0.25 mg at 11/08/16 2240  . aspirin chewable tablet 81 mg  81 mg Oral Daily Idelle Crouch, MD   81 mg at 11/09/16 1011  . azithromycin (ZITHROMAX) 500 mg in dextrose 5 % 250 mL IVPB  500 mg Intravenous Q24H Idelle Crouch, MD   500 mg at 11/09/16 1010  . bisacodyl (DULCOLAX) suppository 10 mg  10 mg Rectal Daily PRN Idelle Crouch, MD      . carvedilol (COREG) tablet 12.5 mg  12.5 mg Oral BID WC Rudene Re, MD   12.5 mg at 11/09/16 1009  . cefTRIAXone (  ROCEPHIN) 1 g in dextrose 5 % 50 mL IVPB  1 g Intravenous Q24H Idelle Crouch, MD 100 mL/hr at 11/08/16 1330 1 g at 11/08/16 1330  . clopidogrel (PLAVIX) tablet 75 mg  75 mg Oral Daily Idelle Crouch, MD   75 mg at 11/09/16 1009  . diltiazem (CARDIZEM CD) 24 hr capsule 240 mg  240 mg Oral Daily [name], MD   240 mg at 11/09/16 1231  . docusate sodium (COLACE) capsule 100 mg  100 mg Oral BID Idelle Crouch, MD   100 mg at 11/09/16 1011  . enoxaparin (LOVENOX) injection 30 mg  30 mg Subcutaneous Q24H Idelle Crouch, MD   30 mg at 11/08/16 1759  . famotidine (PEPCID) IVPB 20 mg premix  20 mg Intravenous Q24H Idelle Crouch, MD   20 mg  at 11/08/16 2220  . furosemide (LASIX) injection 40 mg  40 mg Intravenous Daily [name], MD   40 mg at 11/09/16 1009  . gabapentin (NEURONTIN) capsule 300 mg  300 mg Oral TID Idelle Crouch, MD   300 mg at 11/09/16 1009  . hydrALAZINE (APRESOLINE) injection 10 mg  10 mg Intravenous Q4H PRN Idelle Crouch, MD   10 mg at 11/07/16 1856  . ipratropium-albuterol (DUONEB) 0.5-2.5 (3) MG/3ML nebulizer solution 3 mL  3 mL Nebulization Q4H PRN [name], MD      . nitroGLYCERIN (NITROGLYN) 2 % ointment 2 inch  2 inch Topical Q6H Idelle Crouch, MD   2 inch at 11/09/16 1231  . ondansetron (ZOFRAN) tablet 4 mg  4 mg Oral Q6H PRN Idelle Crouch, MD       Or  . ondansetron [facility]) injection 4 mg  4 mg Intravenous Q6H PRN Idelle Crouch, MD      . potassium chloride SA (K-DUR,KLOR-CON) CR tablet 40 mEq  40 mEq Oral Daily [name], MD   40 mEq at 11/09/16 1231  . sodium chloride flush (NS) 0.9 % injection 3 mL  3 mL Intravenous Q12H Idelle Crouch, MD   3 mL at 11/09/16 1011  . sodium chloride flush (NS) 0.9 % injection 3 mL  3 mL Intravenous Q12H Idelle Crouch, MD   3 mL at 11/09/16 1011  . sodium chloride flush (NS) 0.9 % injection 3 mL  3 mL Intravenous PRN Idelle Crouch, MD      . traMADol Veatrice Bourbon) tablet 50 mg  50 mg Oral Q4H PRN Idelle Crouch, MD   50 mg at 11/08/16 2240     Discharge Medications: Please see discharge summary for a list of discharge medications.  Relevant Imaging Results:  Relevant Lab Results:   Additional Information SSN [id number]  Ross Ludwig, [place]

## 2016-11-09 NOTE — Clinical Social Work Note (Signed)
Patient to be d/c'ed today to [facility].  Patient and family agreeable to plans will transport via daughter's car no need to call report patient is going to ALF.  Evette Cristal, MSW, [place]

## 2016-11-09 NOTE — Discharge Summary (Signed)
[facility] NAME: [name] [name]    MR#:  [id number]  DATE OF BIRTH:  01/11/1923  DATE OF ADMISSION:  11/07/2016 ADMITTING PHYSICIAN: Idelle Crouch, MD  DATE OF DISCHARGE: 11/09/2016  PRIMARY CARE PHYSICIAN: MASOUD,JAVED, MD    ADMISSION DIAGNOSIS:  Atrial fibrillation with RVR (HCC) [I48.91] Acute on chronic congestive heart failure, unspecified congestive heart failure type ([place]) [I50.9] Community acquired pneumonia, unspecified laterality [J18.9]  DISCHARGE DIAGNOSIS:  Principal Problem:   Atrial fibrillation, rapid ([place]) Active Problems:   CAP (community acquired pneumonia)   Acute CHF (congestive heart failure) ([place])   Accelerated hypertension   SECONDARY DIAGNOSIS:   Past Medical History:  Diagnosis Date  . Anemia   . CHF (congestive heart failure) ([place])   . Coronary artery disease   . Emphysema lung ([place])   . Emphysema of lung ([place])   . GERD (gastroesophageal reflux disease)   . Hyperlipidemia   . Hypertension   . Neuropathy ([place])   . Osteoporosis   . Peripheral vascular disease ([place])   . Renal disorder   . Stroke [facility])     HOSPITAL COURSE:   * Atrial fibrillation with rapid ventricular response   On oral carvedilol and Cardizem, converted to normal sinus rhythm.   Get echocardiogram, cardiology consult.   TSH is normal.   Now in NSR- advise to follow with cardiology clinic.  * Acute CHF   On IV Lasix twice a day.   Follow-up echocardiogram.   Currently not much crepitation, change Lasix to once daily.   replace potassium as needed.  * Community-acquired pneumonia   Influenza test is negative, ceftriaxone and azithromycin.   Felt much better, finish 5 days course oral.  * Complain of diarrhea   Now getting better, may be viral.   Negative GI panel and C diff.   Resolved now.  * Accelerated hypertension   Was present on admission, currently stable on oral Coreg, Cardizem, Lasix.       DISCHARGE CONDITIONS:   Stable.  CONSULTS OBTAINED:  Treatment Team:  Yolonda Kida, MD  DRUG ALLERGIES:   Allergies  Allergen Reactions  . Morphine And Related     Heart problems   . Zoloft [Sertraline Hcl] Swelling    DISCHARGE MEDICATIONS:   Current Discharge Medication List    START taking these medications   Details  azithromycin (ZITHROMAX) 500 MG tablet Take 1 tablet (500 mg total) by mouth daily. Qty: 3 tablet, Refills: 0    cefUROXime (CEFTIN) 250 MG tablet Take 1 tablet (250 mg total) by mouth 2 (two) times daily with a meal. Qty: 6 tablet, Refills: 0    diltiazem (CARDIZEM CD) 240 MG 24 hr capsule Take 1 capsule (240 mg total) by mouth daily. Qty: 30 capsule, Refills: 0    potassium chloride SA (K-DUR,KLOR-CON) 10 MEQ tablet Take 1 tablet (10 mEq total) by mouth daily. Qty: 30 tablet, Refills: 0      CONTINUE these medications which have NOT CHANGED   Details  acetaminophen (TYLENOL) 325 MG tablet Take 650 mg by mouth every 6 (six) hours as needed for mild pain or moderate pain.    ALPRAZolam (XANAX) 0.25 MG tablet Take 0.25 mg by mouth 2 (two) times daily as needed for anxiety or agitation.    aspirin 81 MG tablet Take 81 mg by mouth daily.    carvedilol (COREG) 12.5 MG tablet Take 1 tablet (12.5 mg total) by mouth  2 (two) times daily. Qty: 60 tablet, Refills: 11    celecoxib (CELEBREX) 100 MG capsule Take 100 mg by mouth every other day as needed. Refills: 5    Cholecalciferol 2000 units CAPS Take 4,000 Units by mouth daily.     clopidogrel (PLAVIX) 75 MG tablet Take 1 tablet by mouth daily.    cyanocobalamin 1000 MCG tablet Take 1,000 mcg by mouth daily.     dicyclomine (BENTYL) 20 MG tablet Take 1 tablet (20 mg total) by mouth 3 (three) times daily as needed for spasms. Qty: 30 tablet, Refills: 0    FLECTOR 1.3 % PTCH Place 1 patch onto the skin every 12 (twelve) hours.     gabapentin (NEURONTIN) 300 MG capsule Take 1 capsule by  mouth 3 (three) times daily.     loratadine (CLARITIN) 10 MG tablet Take 10 mg by mouth daily.    nitroGLYCERIN (NITRODUR - DOSED IN MG/24 HR) 0.2 mg/hr patch Place 1 patch onto the skin daily.     ondansetron (ZOFRAN) 4 MG tablet Take 1 tablet (4 mg total) by mouth every 8 (eight) hours as needed for nausea or vomiting. Qty: 21 tablet, Refills: 0    Probiotic Product (ALIGN) 4 MG CAPS Take 4 mg by mouth daily.    ranitidine (ZANTAC) 150 MG capsule Take 150 mg by mouth 2 (two) times daily.    sennosides-docusate sodium (SENOKOT-S) 8.6-50 MG tablet Take 2 tablets by mouth daily.    traMADol (ULTRAM) 50 MG tablet Take 50 mg by mouth every 4 (four) hours as needed for moderate pain or severe pain.    furosemide (LASIX) 20 MG tablet Take 1 tablet (20 mg total) by mouth daily. Qty: 7 tablet, Refills: 0         DISCHARGE INSTRUCTIONS:    Follow with PMD in 1-2 weeks.  If you experience worsening of your admission symptoms, develop shortness of breath, life threatening emergency, suicidal or homicidal thoughts you must seek medical attention immediately by calling 911 or calling your MD immediately  if symptoms less severe.  You Must read complete instructions/literature along with all the possible adverse reactions/side effects for all the Medicines you take and that have been prescribed to you. Take any new Medicines after you have completely understood and accept all the possible adverse reactions/side effects.   Please note  You were cared for by a hospitalist during your hospital stay. If you have any questions about your discharge medications or the care you received while you were in the hospital after you are discharged, you can call the unit and asked to speak with the hospitalist on call if the hospitalist that took care of you is not available. Once you are discharged, your primary care physician will handle any further medical issues. Please note that NO REFILLS for any  discharge medications will be authorized once you are discharged, as it is imperative that you return to your primary care physician (or establish a relationship with a primary care physician if you do not have one) for your aftercare needs so that they can reassess your need for medications and monitor your lab values.    Today   CHIEF COMPLAINT:   Chief Complaint  Patient presents with  . Shortness of Breath  . generalized weakness    HISTORY OF PRESENT ILLNESS:  Kally Krummel  is a 83 y.o. female with a known history of CHF and HTN who presents from ALF with acute onset cough and SOB. In  ER, pt did have some chest tightness. Found to be in rapid A-fib of new onset with probable pneumonia on CXR. CHF also noted. Currently pain free. BP elevated. Troponin slightly elevated. She is now admitted.  VITAL SIGNS:  Blood pressure (!) 171/61, pulse 70, temperature 97.7 F (36.5 C), temperature source Oral, resp. rate 16, height 5\' 6"  (1.676 m), weight 68.5 kg (151 lb 1.6 oz), SpO2 91 %.  I/O:   Intake/Output Summary (Last 24 hours) at 11/09/16 1123 Last data filed at 11/09/16 1046  Gross per 24 hour  Intake              720 ml  Output              400 ml  Net              320 ml    PHYSICAL EXAMINATION:   GENERAL:  83 y.o.-year-old patient lying in the bed with no acute distress.  EYES: Pupils equal, round, reactive to light and accommodation. No scleral icterus. Extraocular muscles intact.  HEENT: Head atraumatic, normocephalic. Oropharynx and nasopharynx clear.  NECK:  Supple, no jugular venous distention. No thyroid enlargement, no tenderness.  LUNGS: Normal breath sounds bilaterally, no wheezing, rales,rhonchi or crepitation. No use of accessory muscles of respiration.  CARDIOVASCULAR: S1, S2 normal. No murmurs, rubs, or gallops.  ABDOMEN: Soft, nontender, nondistended. Bowel sounds present. No organomegaly or mass.  EXTREMITIES: No pedal edema, cyanosis, or clubbing.   NEUROLOGIC: Cranial nerves II through XII are intact. Muscle strength 5/5 in all extremities. Sensation intact. Gait not checked.  PSYCHIATRIC: The patient is alert and oriented x 3.  SKIN: No obvious rash, lesion, or ulcer.   DATA REVIEW:   CBC  Recent Labs Lab 11/08/16 0423  WBC 6.4  HGB 10.4*  HCT 30.8*  PLT 148*    Chemistries   Recent Labs Lab 11/08/16 0423 11/09/16 0424  NA 142 141  K 3.4* 3.3*  CL 107 103  CO2 28 29  GLUCOSE 117* 99  BUN 33* 33*  CREATININE 1.65* 1.60*  CALCIUM 9.3 9.0  AST 17  --   ALT 9*  --   ALKPHOS 115  --   BILITOT 0.8  --     Cardiac Enzymes  Recent Labs Lab 11/08/16 0423  TROPONINI 0.06*    Microbiology Results  Results for orders placed or performed during the hospital encounter of 11/07/16  CULTURE, BLOOD (ROUTINE X 2) w Reflex to ID Panel     Status: None (Preliminary result)   Collection Time: 11/07/16 11:05 AM  Result Value Ref Range Status   Specimen Description BLOOD RIGHT ARM  Final   Special Requests BOTTLES DRAWN AEROBIC AND ANAEROBIC 11ML  Final   Culture NO GROWTH 2 DAYS  Final   Report Status PENDING  Incomplete  CULTURE, BLOOD (ROUTINE X 2) w Reflex to ID Panel     Status: None (Preliminary result)   Collection Time: 11/07/16 11:05 AM  Result Value Ref Range Status   Specimen Description BLOOD RIGHT HAND  Final   Special Requests BOTTLES DRAWN AEROBIC AND ANAEROBIC 10ML  Final   Culture NO GROWTH 2 DAYS  Final   Report Status PENDING  Incomplete  MRSA PCR Screening     Status: None   Collection Time: 11/07/16  3:40 PM  Result Value Ref Range Status   MRSA by PCR NEGATIVE NEGATIVE Final    Comment:        The GeneXpert  MRSA Assay (FDA approved for NASAL specimens only), is one component of a comprehensive MRSA colonization surveillance program. It is not intended to diagnose MRSA infection nor to guide or monitor treatment for MRSA infections.   C difficile quick scan w PCR reflex     Status:  None   Collection Time: 11/07/16  4:20 PM  Result Value Ref Range Status   C Diff antigen NEGATIVE NEGATIVE Final   C Diff toxin NEGATIVE NEGATIVE Final   C Diff interpretation No C. difficile detected.  Final  Gastrointestinal Panel by PCR , Stool     Status: None   Collection Time: 11/07/16  4:23 PM  Result Value Ref Range Status   Campylobacter species NOT DETECTED NOT DETECTED Final   Plesimonas shigelloides NOT DETECTED NOT DETECTED Final   Salmonella species NOT DETECTED NOT DETECTED Final   Yersinia enterocolitica NOT DETECTED NOT DETECTED Final   Vibrio species NOT DETECTED NOT DETECTED Final   Vibrio cholerae NOT DETECTED NOT DETECTED Final   Enteroaggregative E coli (EAEC) NOT DETECTED NOT DETECTED Final   Enteropathogenic E coli (EPEC) NOT DETECTED NOT DETECTED Final   Enterotoxigenic E coli (ETEC) NOT DETECTED NOT DETECTED Final   Shiga like toxin producing E coli (STEC) NOT DETECTED NOT DETECTED Final   Shigella/Enteroinvasive E coli (EIEC) NOT DETECTED NOT DETECTED Final   Cryptosporidium NOT DETECTED NOT DETECTED Final   Cyclospora cayetanensis NOT DETECTED NOT DETECTED Final   Entamoeba histolytica NOT DETECTED NOT DETECTED Final   Giardia lamblia NOT DETECTED NOT DETECTED Final   Adenovirus F40/41 NOT DETECTED NOT DETECTED Final   Astrovirus NOT DETECTED NOT DETECTED Final   Norovirus GI/GII NOT DETECTED NOT DETECTED Final   Rotavirus A NOT DETECTED NOT DETECTED Final   Sapovirus (I, II, IV, and V) NOT DETECTED NOT DETECTED Final    RADIOLOGY:  No results found.  EKG:   Orders placed or performed during the hospital encounter of 11/07/16  . ED EKG  . ED EKG  . EKG 12-Lead  . EKG 12-Lead      Management plans discussed with the patient, family and they are in agreement.  CODE STATUS:     Code Status Orders        Start     Ordered   11/07/16 1455  Do not attempt resuscitation (DNR)  Continuous    Question Answer Comment  In the event of cardiac  or respiratory ARREST Do not call a "code blue"   In the event of cardiac or respiratory ARREST Do not perform Intubation, CPR, defibrillation or ACLS   In the event of cardiac or respiratory ARREST Use medication by any route, position, wound care, and other measures to relive pain and suffering. May use oxygen, suction and manual treatment of airway obstruction as needed for comfort.      11/07/16 1454    Code Status History    Date Active Date Inactive Code Status Order ID Comments User Context   11/07/2016  2:13 PM 11/07/2016  2:54 PM Full Code [id number]  Idelle Crouch, MD Inpatient    Advance Directive Documentation   Flowsheet Row Most Recent Value  Type of Advance Directive  Healthcare Power of [place], Out of facility DNR (pink MOST or yellow form)  Pre-existing out of facility DNR order (yellow form or pink MOST form)  No data  "MOST" Form in Place?  No data      TOTAL TIME TAKING CARE OF THIS PATIENT: 35 minutes.  [name] M.D on 11/09/2016 at 11:23 AM  Between 7am to 6pm - Pager - [phone]  After 6pm go to [email or web address] - password [email or web address] [facility]  Office  [phone]  CC: Primary care physician; Cletis Athens, MD   Note: This dictation was prepared with Dragon dictation along with smaller phrase technology. Any transcriptional errors that result from this process are unintentional.

## 2016-11-09 NOTE — Progress Notes (Signed)
Pt. Slept well throughout the night with c/o pain x1, medicated with effective results. C/o anxiety x1, medicated with effective results.

## 2016-11-09 NOTE — Discharge Instructions (Signed)
Heart Failure Clinic appointment on November 17, 2016 at 10:00am with Darylene Price, [place]. Please call [phone] to reschedule.

## 2016-11-09 NOTE — Progress Notes (Signed)
CCMD called, pt. Had a 2.39 pause, pt. Alert and oriented with no c/o SOB or acute distress noted. Will continue to monitor pt. [place] paged to update, awaiting call back.

## 2016-11-09 NOTE — Progress Notes (Signed)
Initial Heart Failure Clinic appointment made on November 17, 2016 at 10:00am. Thank you.

## 2016-11-09 NOTE — Care Management Important Message (Signed)
Important Message  Patient Details  Name: SAMANDA DELKER MRN: [id number] Date of Birth: 04/23/1923   Medicare Important Message Given:  Yes    Shelbie Ammons, RN 11/09/2016, 12:13 PM

## 2016-11-09 NOTE — Clinical Social Work Note (Signed)
Clinical Social Work Assessment  Patient Details  Name: [name] [name] MRN: [id number] Date of Birth: 1922/12/18  Date of referral:  11/09/16               Reason for consult:  Facility Placement                Permission sought to share information with:  Family Supports Permission granted to share information::  Yes, Verbal Permission Granted  Name::     Sheilah Pigeon Daughter [phone]   Agency::  [facility]  Relationship::     Contact Information:     Housing/Transportation Living arrangements for the past 2 months:  [place] of Information:  Adult Children Patient Interpreter Needed:  None Criminal Activity/Legal Involvement Pertinent to Current Situation/Hospitalization:  No - Comment as needed Significant Relationships:  Adult Children Lives with:    Do you feel safe going back to the place where you live?  Yes Need for family participation in patient care:  Yes (Comment)  Care giving concerns:  Patient's family does not express any concerns about returning to ALF.   Social Worker assessment / plan:  Patient is a 83 year old female who is alert and oriented x4.  Patient has been at [facility] for awhile.  Patient and family do not have any issues with returning back to ALF.  Patient's family did not express any issues or concerns.  Employment status:  Retired Forensic scientist:  Medicare PT Recommendations:  Home with [place] / Referral to community resources:     Patient/Family's Response to care:  Patient and family agre agreeable to going back to ALF with home health PT.  Patient/Family's Understanding of and Emotional Response to Diagnosis, Current Treatment, and Prognosis:  Patient's family expressed they are looking forward to returning back home.  Emotional Assessment Appearance:  Appears stated age Attitude/Demeanor/Rapport:    Affect (typically observed):  Appropriate, Calm Orientation:  Oriented to  Time,  Oriented to Place, Oriented to Self, Oriented to Situation Alcohol / Substance use:  Not Applicable Psych involvement (Current and /or in the community):  No (Comment)  Discharge Needs  Concerns to be addressed:  No discharge needs identified Readmission within the last 30 days:  No Current discharge risk:  None Barriers to Discharge:  No Barriers Identified   Ross Ludwig, LCSWA 11/09/2016, 6:20 PM

## 2016-11-09 NOTE — Consult Note (Signed)
[facility] Nurse wound consult note Reason for Consult:Patient admitted from [facility].  Wears Unnas boots, changed twice weekly.  Will change today.  Anticipate discharge.  Wound type:chronic venous insufficiency Pressure Injury POA: N/A Measurement:0.5 cm nonintact lesion noted to left anterior lower leg.  Calcium alginate applied to this.  0.5 cm nonintact lesion to right third toe.  Calcium alginate and dry dressing/tape applied to this. Legs cleansed with soap and water.  Zinc layer secured with self adherent Coban layer applied from below toes to below knees.  Wound [id number] and moist Drainage (amount, consistency, odor) None noted Periwound:Dry skin Dressing procedure/placement/frequency:Calcium alginate to wounds  Zinc layer secured with coban.  Change twice weekly. Discharging today.  Will not follow at this time.  Please re-consult if needed.  Domenic Moras RN BSN [place] Pager [phone]

## 2016-11-09 NOTE — Progress Notes (Signed)
*  PRELIMINARY RESULTS* Echocardiogram 2D Echocardiogram has been performed.  Sherrie Sport 11/09/2016, 9:33 AM

## 2016-11-09 NOTE — Progress Notes (Signed)
Dr. Estanislado Pandy returned page and updated on pause. He said to continue to monitor pt.

## 2016-11-09 NOTE — Care Management (Signed)
Discharge to [facility] living per Dr. Anselm Jungling. Telephone call to [facility] intake. Requested resumption of care order per Dr. Anselm Jungling.                                                Shelbie Ammons RN MSN CCM Care Management

## 2016-11-10 DIAGNOSIS — K219 Gastro-esophageal reflux disease without esophagitis: Secondary | ICD-10-CM | POA: Diagnosis not present

## 2016-11-10 DIAGNOSIS — I130 Hypertensive heart and chronic kidney disease with heart failure and stage 1 through stage 4 chronic kidney disease, or unspecified chronic kidney disease: Secondary | ICD-10-CM | POA: Diagnosis not present

## 2016-11-10 DIAGNOSIS — E785 Hyperlipidemia, unspecified: Secondary | ICD-10-CM | POA: Diagnosis not present

## 2016-11-10 DIAGNOSIS — J449 Chronic obstructive pulmonary disease, unspecified: Secondary | ICD-10-CM | POA: Diagnosis not present

## 2016-11-11 DIAGNOSIS — L97822 Non-pressure chronic ulcer of other part of left lower leg with fat layer exposed: Secondary | ICD-10-CM | POA: Diagnosis not present

## 2016-11-11 DIAGNOSIS — I130 Hypertensive heart and chronic kidney disease with heart failure and stage 1 through stage 4 chronic kidney disease, or unspecified chronic kidney disease: Secondary | ICD-10-CM | POA: Diagnosis not present

## 2016-11-11 DIAGNOSIS — L97812 Non-pressure chronic ulcer of other part of right lower leg with fat layer exposed: Secondary | ICD-10-CM | POA: Diagnosis not present

## 2016-11-11 DIAGNOSIS — I87333 Chronic venous hypertension (idiopathic) with ulcer and inflammation of bilateral lower extremity: Secondary | ICD-10-CM | POA: Diagnosis not present

## 2016-11-11 DIAGNOSIS — I509 Heart failure, unspecified: Secondary | ICD-10-CM | POA: Diagnosis not present

## 2016-11-11 DIAGNOSIS — M80022D Age-related osteoporosis with current pathological fracture, left humerus, subsequent encounter for fracture with routine healing: Secondary | ICD-10-CM | POA: Diagnosis not present

## 2016-11-12 DIAGNOSIS — L97822 Non-pressure chronic ulcer of other part of left lower leg with fat layer exposed: Secondary | ICD-10-CM | POA: Diagnosis not present

## 2016-11-12 DIAGNOSIS — M80022D Age-related osteoporosis with current pathological fracture, left humerus, subsequent encounter for fracture with routine healing: Secondary | ICD-10-CM | POA: Diagnosis not present

## 2016-11-12 DIAGNOSIS — L97812 Non-pressure chronic ulcer of other part of right lower leg with fat layer exposed: Secondary | ICD-10-CM | POA: Diagnosis not present

## 2016-11-12 DIAGNOSIS — I87333 Chronic venous hypertension (idiopathic) with ulcer and inflammation of bilateral lower extremity: Secondary | ICD-10-CM | POA: Diagnosis not present

## 2016-11-12 DIAGNOSIS — F411 Generalized anxiety disorder: Secondary | ICD-10-CM | POA: Diagnosis not present

## 2016-11-12 DIAGNOSIS — I509 Heart failure, unspecified: Secondary | ICD-10-CM | POA: Diagnosis not present

## 2016-11-12 DIAGNOSIS — I130 Hypertensive heart and chronic kidney disease with heart failure and stage 1 through stage 4 chronic kidney disease, or unspecified chronic kidney disease: Secondary | ICD-10-CM | POA: Diagnosis not present

## 2016-11-12 LAB — CULTURE, BLOOD (ROUTINE X 2)
Culture: NO GROWTH
Culture: NO GROWTH

## 2016-11-16 ENCOUNTER — Encounter: Payer: Self-pay | Admitting: *Deleted

## 2016-11-16 ENCOUNTER — Inpatient Hospital Stay
Admission: EM | Admit: 2016-11-16 | Discharge: 2016-11-18 | DRG: 308 | Disposition: A | Discharge status: Home Health | Payer: Medicare Other | Attending: Internal Medicine | Admitting: Internal Medicine

## 2016-11-16 ENCOUNTER — Emergency Department: Payer: Medicare Other

## 2016-11-16 ENCOUNTER — Inpatient Hospital Stay: Payer: Medicare Other

## 2016-11-16 DIAGNOSIS — R109 Unspecified abdominal pain: Secondary | ICD-10-CM

## 2016-11-16 DIAGNOSIS — Z7982 Long term (current) use of aspirin: Secondary | ICD-10-CM

## 2016-11-16 DIAGNOSIS — I739 Peripheral vascular disease, unspecified: Secondary | ICD-10-CM | POA: Diagnosis present

## 2016-11-16 DIAGNOSIS — Z791 Long term (current) use of non-steroidal anti-inflammatories (NSAID): Secondary | ICD-10-CM

## 2016-11-16 DIAGNOSIS — I952 Hypotension due to drugs: Secondary | ICD-10-CM | POA: Diagnosis present

## 2016-11-16 DIAGNOSIS — L97822 Non-pressure chronic ulcer of other part of left lower leg with fat layer exposed: Secondary | ICD-10-CM | POA: Diagnosis not present

## 2016-11-16 DIAGNOSIS — D649 Anemia, unspecified: Secondary | ICD-10-CM | POA: Diagnosis not present

## 2016-11-16 DIAGNOSIS — J90 Pleural effusion, not elsewhere classified: Secondary | ICD-10-CM | POA: Diagnosis not present

## 2016-11-16 DIAGNOSIS — J9600 Acute respiratory failure, unspecified whether with hypoxia or hypercapnia: Secondary | ICD-10-CM | POA: Diagnosis present

## 2016-11-16 DIAGNOSIS — K219 Gastro-esophageal reflux disease without esophagitis: Secondary | ICD-10-CM | POA: Diagnosis present

## 2016-11-16 DIAGNOSIS — Z8673 Personal history of transient ischemic attack (TIA), and cerebral infarction without residual deficits: Secondary | ICD-10-CM

## 2016-11-16 DIAGNOSIS — M1990 Unspecified osteoarthritis, unspecified site: Secondary | ICD-10-CM | POA: Diagnosis present

## 2016-11-16 DIAGNOSIS — I5032 Chronic diastolic (congestive) heart failure: Secondary | ICD-10-CM | POA: Diagnosis present

## 2016-11-16 DIAGNOSIS — N183 Chronic kidney disease, stage 3 (moderate): Secondary | ICD-10-CM | POA: Diagnosis present

## 2016-11-16 DIAGNOSIS — T50995A Adverse effect of other drugs, medicaments and biological substances, initial encounter: Secondary | ICD-10-CM | POA: Diagnosis present

## 2016-11-16 DIAGNOSIS — Z9109 Other allergy status, other than to drugs and biological substances: Secondary | ICD-10-CM

## 2016-11-16 DIAGNOSIS — J439 Emphysema, unspecified: Secondary | ICD-10-CM | POA: Diagnosis present

## 2016-11-16 DIAGNOSIS — I959 Hypotension, unspecified: Secondary | ICD-10-CM | POA: Diagnosis present

## 2016-11-16 DIAGNOSIS — Z79899 Other long term (current) drug therapy: Secondary | ICD-10-CM

## 2016-11-16 DIAGNOSIS — D638 Anemia in other chronic diseases classified elsewhere: Secondary | ICD-10-CM | POA: Diagnosis present

## 2016-11-16 DIAGNOSIS — Y92099 Unspecified place in other non-institutional residence as the place of occurrence of the external cause: Secondary | ICD-10-CM

## 2016-11-16 DIAGNOSIS — R739 Hyperglycemia, unspecified: Secondary | ICD-10-CM | POA: Diagnosis present

## 2016-11-16 DIAGNOSIS — G629 Polyneuropathy, unspecified: Secondary | ICD-10-CM | POA: Diagnosis present

## 2016-11-16 DIAGNOSIS — E785 Hyperlipidemia, unspecified: Secondary | ICD-10-CM | POA: Diagnosis present

## 2016-11-16 DIAGNOSIS — I509 Heart failure, unspecified: Secondary | ICD-10-CM | POA: Diagnosis not present

## 2016-11-16 DIAGNOSIS — I2510 Atherosclerotic heart disease of native coronary artery without angina pectoris: Secondary | ICD-10-CM | POA: Diagnosis present

## 2016-11-16 DIAGNOSIS — Z885 Allergy status to narcotic agent status: Secondary | ICD-10-CM

## 2016-11-16 DIAGNOSIS — N179 Acute kidney failure, unspecified: Secondary | ICD-10-CM | POA: Diagnosis present

## 2016-11-16 DIAGNOSIS — R001 Bradycardia, unspecified: Principal | ICD-10-CM | POA: Diagnosis present

## 2016-11-16 DIAGNOSIS — M80022D Age-related osteoporosis with current pathological fracture, left humerus, subsequent encounter for fracture with routine healing: Secondary | ICD-10-CM | POA: Diagnosis not present

## 2016-11-16 DIAGNOSIS — Z7902 Long term (current) use of antithrombotics/antiplatelets: Secondary | ICD-10-CM

## 2016-11-16 DIAGNOSIS — I130 Hypertensive heart and chronic kidney disease with heart failure and stage 1 through stage 4 chronic kidney disease, or unspecified chronic kidney disease: Secondary | ICD-10-CM | POA: Diagnosis present

## 2016-11-16 DIAGNOSIS — L89899 Pressure ulcer of other site, unspecified stage: Secondary | ICD-10-CM | POA: Diagnosis present

## 2016-11-16 DIAGNOSIS — M810 Age-related osteoporosis without current pathological fracture: Secondary | ICD-10-CM | POA: Diagnosis present

## 2016-11-16 DIAGNOSIS — R9431 Abnormal electrocardiogram [ECG] [EKG]: Secondary | ICD-10-CM | POA: Diagnosis not present

## 2016-11-16 DIAGNOSIS — Z66 Do not resuscitate: Secondary | ICD-10-CM | POA: Diagnosis present

## 2016-11-16 DIAGNOSIS — R262 Difficulty in walking, not elsewhere classified: Secondary | ICD-10-CM

## 2016-11-16 DIAGNOSIS — I4891 Unspecified atrial fibrillation: Secondary | ICD-10-CM | POA: Diagnosis not present

## 2016-11-16 DIAGNOSIS — I87333 Chronic venous hypertension (idiopathic) with ulcer and inflammation of bilateral lower extremity: Secondary | ICD-10-CM | POA: Diagnosis not present

## 2016-11-16 DIAGNOSIS — Z9849 Cataract extraction status, unspecified eye: Secondary | ICD-10-CM

## 2016-11-16 DIAGNOSIS — Z90710 Acquired absence of both cervix and uterus: Secondary | ICD-10-CM | POA: Diagnosis not present

## 2016-11-16 DIAGNOSIS — E875 Hyperkalemia: Secondary | ICD-10-CM | POA: Diagnosis present

## 2016-11-16 DIAGNOSIS — M6281 Muscle weakness (generalized): Secondary | ICD-10-CM

## 2016-11-16 DIAGNOSIS — Z9049 Acquired absence of other specified parts of digestive tract: Secondary | ICD-10-CM

## 2016-11-16 DIAGNOSIS — L97812 Non-pressure chronic ulcer of other part of right lower leg with fat layer exposed: Secondary | ICD-10-CM | POA: Diagnosis not present

## 2016-11-16 LAB — CBC WITH DIFFERENTIAL/PLATELET
BASOS ABS: 0.1 10*3/uL (ref 0–0.1)
Basophils Relative: 1 %
Eosinophils Absolute: 0.4 10*3/uL (ref 0–0.7)
Eosinophils Relative: 5 %
HEMATOCRIT: 30.1 % — AB (ref 35.0–47.0)
HEMOGLOBIN: 9.9 g/dL — AB (ref 12.0–16.0)
LYMPHS PCT: 17 %
Lymphs Abs: 1.5 10*3/uL (ref 1.0–3.6)
MCH: 32.7 pg (ref 26.0–34.0)
MCHC: 33 g/dL (ref 32.0–36.0)
MCV: 99.2 fL (ref 80.0–100.0)
MONO ABS: 1 10*3/uL — AB (ref 0.2–0.9)
Monocytes Relative: 11 %
NEUTROS ABS: 5.8 10*3/uL (ref 1.4–6.5)
Neutrophils Relative %: 66 %
Platelets: 149 10*3/uL — ABNORMAL LOW (ref 150–440)
RBC: 3.04 MIL/uL — AB (ref 3.80–5.20)
RDW: 14.5 % (ref 11.5–14.5)
WBC: 8.7 10*3/uL (ref 3.6–11.0)

## 2016-11-16 LAB — TROPONIN I

## 2016-11-16 LAB — CREATININE, SERUM
Creatinine, Ser: 2.31 mg/dL — ABNORMAL HIGH (ref 0.44–1.00)
GFR, EST AFRICAN AMERICAN: 20 mL/min — AB (ref >60)
GFR, EST NON AFRICAN AMERICAN: 17 mL/min — AB (ref >60)

## 2016-11-16 LAB — COMPREHENSIVE METABOLIC PANEL
ALBUMIN: 3 g/dL — AB (ref 3.5–5.0)
ALT: 10 U/L — ABNORMAL LOW (ref 14–54)
AST: 20 U/L (ref 15–41)
Alkaline Phosphatase: 107 U/L (ref 38–126)
Anion gap: 4 — ABNORMAL LOW (ref 5–15)
BUN: 43 mg/dL — AB (ref 6–20)
CHLORIDE: 111 mmol/L (ref 101–111)
CO2: 21 mmol/L — ABNORMAL LOW (ref 22–32)
Calcium: 8.6 mg/dL — ABNORMAL LOW (ref 8.9–10.3)
Creatinine, Ser: 2.27 mg/dL — ABNORMAL HIGH (ref 0.44–1.00)
GFR calc Af Amer: 21 mL/min — ABNORMAL LOW (ref >60)
GFR, EST NON AFRICAN AMERICAN: 18 mL/min — AB (ref >60)
Glucose, Bld: 157 mg/dL — ABNORMAL HIGH (ref 65–99)
Potassium: 5.1 mmol/L (ref 3.5–5.1)
Sodium: 136 mmol/L (ref 135–145)
Total Bilirubin: 0.7 mg/dL (ref 0.3–1.2)
Total Protein: 6.3 g/dL — ABNORMAL LOW (ref 6.5–8.1)

## 2016-11-16 LAB — CBC
HEMATOCRIT: 31.6 % — AB (ref 35.0–47.0)
HEMOGLOBIN: 10.2 g/dL — AB (ref 12.0–16.0)
MCH: 32 pg (ref 26.0–34.0)
MCHC: 32.3 g/dL (ref 32.0–36.0)
MCV: 99.2 fL (ref 80.0–100.0)
Platelets: 129 10*3/uL — ABNORMAL LOW (ref 150–440)
RBC: 3.18 MIL/uL — ABNORMAL LOW (ref 3.80–5.20)
RDW: 14.6 % — AB (ref 11.5–14.5)
WBC: 11.3 10*3/uL — AB (ref 3.6–11.0)

## 2016-11-16 LAB — PROCALCITONIN

## 2016-11-16 LAB — GLUCOSE, CAPILLARY: GLUCOSE-CAPILLARY: 178 mg/dL — AB (ref 65–99)

## 2016-11-16 LAB — LACTIC ACID, PLASMA: LACTIC ACID, VENOUS: 1 mmol/L (ref 0.5–1.9)

## 2016-11-16 LAB — BRAIN NATRIURETIC PEPTIDE: B Natriuretic Peptide: 242 pg/mL — ABNORMAL HIGH (ref 0.0–100.0)

## 2016-11-16 MED ORDER — GABAPENTIN 300 MG PO CAPS
300.0000 mg | ORAL_CAPSULE | Freq: Three times a day (TID) | ORAL | Status: DC
  Administered 2016-11-17 – 2016-11-18 (×4): 300 mg via ORAL
  Filled 2016-11-16 (×4): qty 1

## 2016-11-16 MED ORDER — ATROPINE SULFATE 1 MG/10ML IJ SOSY: 1.0000 mg | PREFILLED_SYRINGE | Freq: Once | INTRAMUSCULAR | Status: DC

## 2016-11-16 MED ORDER — FAMOTIDINE IN NACL 20-0.9 MG/50ML-% IV SOLN: 20.0000 mg | Freq: Two times a day (BID) | INTRAVENOUS | Status: DC

## 2016-11-16 MED ORDER — SENNOSIDES-DOCUSATE SODIUM 8.6-50 MG PO TABS
2.0000 | ORAL_TABLET | Freq: Every day | ORAL | Status: DC
  Administered 2016-11-17 – 2016-11-18 (×2): 2 via ORAL
  Filled 2016-11-16 (×2): qty 2

## 2016-11-16 MED ORDER — ACETAMINOPHEN 325 MG PO TABS: 650.0000 mg | ORAL_TABLET | ORAL | Status: DC | PRN

## 2016-11-16 MED ORDER — PANTOPRAZOLE SODIUM 40 MG IV SOLR
40.0000 mg | Freq: Two times a day (BID) | INTRAVENOUS | Status: DC
  Administered 2016-11-17 (×2): 40 mg via INTRAVENOUS
  Filled 2016-11-16 (×2): qty 40

## 2016-11-16 MED ORDER — HEPARIN SODIUM (PORCINE) 5000 UNIT/ML IJ SOLN
5000.0000 [IU] | Freq: Three times a day (TID) | INTRAMUSCULAR | Status: DC
  Administered 2016-11-17 – 2016-11-18 (×4): 5000 [IU] via SUBCUTANEOUS
  Filled 2016-11-16 (×4): qty 1

## 2016-11-16 MED ORDER — SODIUM CHLORIDE 0.9 % IV SOLN
1.0000 g | Freq: Once | INTRAVENOUS | Status: AC
  Administered 2016-11-16: 1 g via INTRAVENOUS

## 2016-11-16 MED ORDER — LORATADINE 10 MG PO TABS
10.0000 mg | ORAL_TABLET | Freq: Every day | ORAL | Status: DC
  Administered 2016-11-17 – 2016-11-18 (×2): 10 mg via ORAL
  Filled 2016-11-16 (×2): qty 1

## 2016-11-16 MED ORDER — ASPIRIN 81 MG PO CHEW
CHEWABLE_TABLET | ORAL | Status: AC
  Administered 2016-11-16: 324 mg via ORAL
  Filled 2016-11-16: qty 4

## 2016-11-16 MED ORDER — NOREPINEPHRINE BITARTRATE 1 MG/ML IV SOLN
0.0000 ug/min | Freq: Once | INTRAVENOUS | Status: AC
  Administered 2016-11-16: 2 ug/min via INTRAVENOUS
  Filled 2016-11-16: qty 4

## 2016-11-16 MED ORDER — ATROPINE SULFATE 1 MG/10ML IJ SOSY
0.5000 mg | PREFILLED_SYRINGE | Freq: Once | INTRAMUSCULAR | Status: AC
  Administered 2016-11-16: 0.5 mg via INTRAVENOUS

## 2016-11-16 MED ORDER — GLUCAGON HCL (RDNA) 1 MG IJ SOLR
3.0000 mg | Freq: Once | INTRAMUSCULAR | Status: DC
  Filled 2016-11-16: qty 3

## 2016-11-16 MED ORDER — ATROPINE SULFATE 1 MG/10ML IJ SOSY
PREFILLED_SYRINGE | INTRAMUSCULAR | Status: AC
  Administered 2016-11-16: 0.5 mg via INTRAVENOUS
  Filled 2016-11-16: qty 20

## 2016-11-16 MED ORDER — SODIUM CHLORIDE 0.9 % IV SOLN
Freq: Once | INTRAVENOUS | Status: AC
Start: 2016-11-16 — End: 2016-11-16
  Administered 2016-11-16: 17:00:00 via INTRAVENOUS

## 2016-11-16 MED ORDER — ASPIRIN 81 MG PO CHEW: 324.0000 mg | CHEWABLE_TABLET | ORAL | Status: DC

## 2016-11-16 MED ORDER — SODIUM CHLORIDE 0.9 % IV SOLN: 250.0000 mL | INTRAVENOUS | Status: DC | PRN

## 2016-11-16 MED ORDER — DEXTROSE 5 % IV SOLN
3.0000 mg/h | INTRAVENOUS | Status: DC
Start: 2016-11-16 — End: 2016-11-16
  Administered 2016-11-16: 3 mg/h via INTRAVENOUS
  Filled 2016-11-16: qty 5

## 2016-11-16 MED ORDER — CLOPIDOGREL BISULFATE 75 MG PO TABS
75.0000 mg | ORAL_TABLET | Freq: Every day | ORAL | Status: DC
  Administered 2016-11-17 – 2016-11-18 (×2): 75 mg via ORAL
  Filled 2016-11-16 (×2): qty 1

## 2016-11-16 MED ORDER — ASPIRIN 300 MG RE SUPP: 300.0000 mg | RECTAL | Status: DC

## 2016-11-16 MED ORDER — GLUCAGON HCL (RDNA) 1 MG IJ SOLR
3.0000 mg | Freq: Once | INTRAMUSCULAR | Status: AC
  Administered 2016-11-16: 3 mg via INTRAVENOUS
  Filled 2016-11-16: qty 3

## 2016-11-16 MED ORDER — GLUCAGON HCL RDNA (DIAGNOSTIC) 1 MG IJ SOLR
INTRAMUSCULAR | Status: AC
  Filled 2016-11-16: qty 3

## 2016-11-16 MED ORDER — ASPIRIN EC 81 MG PO TBEC
81.0000 mg | DELAYED_RELEASE_TABLET | Freq: Every day | ORAL | Status: DC
  Administered 2016-11-17 – 2016-11-18 (×2): 81 mg via ORAL
  Filled 2016-11-16 (×2): qty 1

## 2016-11-16 MED ORDER — ALPRAZOLAM 0.25 MG PO TABS: 0.2500 mg | ORAL_TABLET | Freq: Two times a day (BID) | ORAL | Status: DC | PRN

## 2016-11-16 MED ORDER — CALCIUM GLUCONATE 10 % IV SOLN
INTRAVENOUS | Status: AC
  Administered 2016-11-16: 1000 mg
  Filled 2016-11-16: qty 10

## 2016-11-16 MED ORDER — DEXTROSE 5 % IV SOLN
0.0000 ug/min | INTRAVENOUS | Status: DC
  Filled 2016-11-16: qty 4

## 2016-11-16 MED ORDER — ASPIRIN 81 MG PO CHEW
324.0000 mg | CHEWABLE_TABLET | Freq: Once | ORAL | Status: AC
  Administered 2016-11-16: 324 mg via ORAL

## 2016-11-16 MED ORDER — ONDANSETRON HCL 4 MG/2ML IJ SOLN: 4.0000 mg | Freq: Four times a day (QID) | INTRAMUSCULAR | Status: DC | PRN

## 2016-11-16 NOTE — Progress Notes (Signed)
Pt stable and off levophed and glucagon gtts maps >65 and heart rate between 58-70's will change orders from ICU to telemetry unit.  Marda Stalker, [place] Pager [phone] (please enter 7 digits) [place] Pager [phone] (please enter 7 digits)  Merton Border, MD PCCM service Mobile [phone] Pager [phone] 11/17/2016

## 2016-11-16 NOTE — ED Notes (Signed)
Received report from [place] at 2332

## 2016-11-16 NOTE — H&P (Signed)
PULMONARY / CRITICAL CARE MEDICINE   Name: [name] [name] MRN: [id number] DOB: 03-05-1923    ADMISSION DATE:  11/16/2016 CONSULTATION DATE:  11/16/2016  REFERRING MD:  Dr. Cinda Quest  CHIEF COMPLAINT:  Weakness  HISTORY OF PRESENT ILLNESS:   This is a 83 yo female with a PMH of Stroke, Renal disorder, PVD, Osteoporosis, Neuropathy, HTN, Hyperlipidemia, GERD, Emphysema, CAD, Anemia, Left humerus fracture s/p ORIF (08/2016) and CHF.  She presented to [facility] ER 01/29 from [facility] with c/o generalized weakness and mild shortness of breath onset of symptoms 01/29.  She was recently discharged from the hospital and treated for Pneumonia.  Pt arrived to ER via EMS and upon arrival the pt was found to be hypotensive systolic bp [id number] and EKG revealed pt was in a junctional bradycardic rhythm with heart rate in the 20's. She does take scheduled po cardizem and carvedilol most recent dose 01/29. In the ER she received 2L NS fluid bolus and calcium gluconate with no improvement of symptoms, therefore a glucagon and neosynephrine gtt was initiated.  PCCM contacted 01/29 to admit pt to ICU for hypotension and junctional rhythm secondary to antiarrhythmic and beta blocker medication.  PAST MEDICAL HISTORY :  She  has a past medical history of Anemia; CHF (congestive heart failure) ([place]); Coronary artery disease; Emphysema lung ([place]); Emphysema of lung ([place]); GERD (gastroesophageal reflux disease); Hyperlipidemia; Hypertension; Neuropathy ([place]); Osteoporosis; Peripheral vascular disease ([place]); Renal disorder; and Stroke ([place]).  PAST SURGICAL HISTORY: She  has a past surgical history that includes Cholecystectomy (1992); Abdominal hysterectomy (1982); Joint replacement (2005); and Cataract extraction.  Allergies  Allergen Reactions  . Morphine And Related     Heart problems   . Zoloft [Sertraline Hcl] Swelling    No current facility-administered medications on file prior to encounter.     Current Outpatient Prescriptions on File Prior to Encounter  Medication Sig  . acetaminophen (TYLENOL) 325 MG tablet Take 650 mg by mouth every 6 (six) hours as needed for mild pain or moderate pain.  Marland Kitchen ALPRAZolam (XANAX) 0.25 MG tablet Take 0.25 mg by mouth 2 (two) times daily as needed for anxiety or agitation.  Marland Kitchen aspirin 81 MG tablet Take 81 mg by mouth daily.  . carvedilol (COREG) 12.5 MG tablet Take 1 tablet (12.5 mg total) by mouth 2 (two) times daily.  . celecoxib (CELEBREX) 100 MG capsule Take 100 mg by mouth every other day as needed.  . Cholecalciferol 2000 units CAPS Take 4,000 Units by mouth daily.   . clopidogrel (PLAVIX) 75 MG tablet Take 1 tablet by mouth daily.  . cyanocobalamin 1000 MCG tablet Take 1,000 mcg by mouth daily.   Marland Kitchen dicyclomine (BENTYL) 20 MG tablet Take 1 tablet (20 mg total) by mouth 3 (three) times daily as needed for spasms.  Marland Kitchen diltiazem (CARDIZEM CD) 240 MG 24 hr capsule Take 1 capsule (240 mg total) by mouth daily.  Marland Kitchen FLECTOR 1.3 % PTCH Place 1 patch onto the skin every 12 (twelve) hours.   . furosemide (LASIX) 20 MG tablet Take 1 tablet (20 mg total) by mouth daily. (Patient not taking: Reported on 11/07/2016)  . gabapentin (NEURONTIN) 300 MG capsule Take 1 capsule by mouth 3 (three) times daily.   Marland Kitchen loratadine (CLARITIN) 10 MG tablet Take 10 mg by mouth daily.  . nitroGLYCERIN (NITRODUR - DOSED IN MG/24 HR) 0.2 mg/hr patch Place 1 patch onto the skin daily.   . ondansetron (ZOFRAN) 4 MG tablet Take 1 tablet (  4 mg total) by mouth every 8 (eight) hours as needed for nausea or vomiting.  . potassium chloride SA (K-DUR,KLOR-CON) 10 MEQ tablet Take 1 tablet (10 mEq total) by mouth daily.  . Probiotic Product (ALIGN) 4 MG CAPS Take 4 mg by mouth daily.  . ranitidine (ZANTAC) 150 MG capsule Take 150 mg by mouth 2 (two) times daily.  . sennosides-docusate sodium (SENOKOT-S) 8.6-50 MG tablet Take 2 tablets by mouth daily.  . traMADol (ULTRAM) 50 MG tablet Take 50  mg by mouth every 4 (four) hours as needed for moderate pain or severe pain.    FAMILY HISTORY:  Her indicated that the status of her sister is unknown.    SOCIAL HISTORY: She  reports that she has never smoked. She has never used smokeless tobacco. She reports that she does not drink alcohol or use drugs.  REVIEW OF SYSTEMS:  Positives in BOLD Gen: Denies fever, chills, weight change, fatigue, night sweats HEENT: Denies blurred vision, double vision, hearing loss, tinnitus, sinus congestion, rhinorrhea, sore throat, neck stiffness, dysphagia PULM: shortness of breath, cough, sputum production, hemoptysis, wheezing CV: Denies chest pain, edema, orthopnea, paroxysmal nocturnal dyspnea, palpitations GI: mild abdominal pain, nausea, vomiting, diarrhea, hematochezia, melena, constipation, change in bowel habits GU: Denies dysuria, hematuria, polyuria, oliguria, urethral discharge Endocrine: Denies hot or cold intolerance, polyuria, polyphagia or appetite change Derm: Denies rash, dry skin, scaling or peeling skin change Heme: Denies easy bruising, bleeding, bleeding gums Neuro: headache, numbness, weakness, slurred speech, loss of memory or consciousness  SUBJECTIVE:  Pt stating she feels very weak and tired she no longer feels short of breath.   VITAL SIGNS: BP (!) 87/44   Pulse (!) 42   Resp 14   Ht 5\' 6"  (1.676 m)   Wt 68.5 kg (151 lb)   SpO2 99%   BMI 24.37 kg/m   HEMODYNAMICS:    VENTILATOR SETTINGS:    INTAKE / OUTPUT: No intake/output data recorded.  PHYSICAL EXAMINATION: General: acutely ill appearing Caucasian female Neuro:  Lethargic confused to situation at times, follows commands, PERRLA  HEENT: supple, no JVD Cardiovascular: junctional rhythm, no M/R/G Lungs: clear throughout, even, non labored Abdomen: hypoactive BS x4, soft, non distended, right lower quadrant abdominal tenderness Musculoskeletal: normal bulk and tone, no edema, bilateral unna boots in  place  Skin: 1x1 healing skin tear right forearm, 1x2 abrasion right second toe, scattered ecchymosis  LABS:  BMET  Recent Labs Lab 11/16/16 1604  NA 136  K 5.1  CL 111  CO2 21*  BUN 43*  CREATININE 2.27*  GLUCOSE 157*    Electrolytes  Recent Labs Lab 11/16/16 1604  CALCIUM 8.6*    CBC  Recent Labs Lab 11/16/16 1604  WBC 8.7  HGB 9.9*  HCT 30.1*  PLT 149*    Coag's No results for input(s): APTT, INR in the last 168 hours.  Sepsis Markers No results for input(s): LATICACIDVEN, PROCALCITON, O2SATVEN in the last 168 hours.  ABG No results for input(s): PHART, PCO2ART, PO2ART in the last 168 hours.  Liver Enzymes  Recent Labs Lab 11/16/16 1604  AST 20  ALT 10*  ALKPHOS 107  BILITOT 0.7  ALBUMIN 3.0*    Cardiac Enzymes  Recent Labs Lab 11/16/16 1604  TROPONINI <0.03    Glucose  Recent Labs Lab 11/16/16 1620  GLUCAP 178*    Imaging Dg Chest 1 View  Result Date: 11/16/2016 CLINICAL DATA:  Acute onset of bradycardia.  Initial encounter. EXAM: CHEST 1 VIEW  COMPARISON:  Chest radiograph performed 11/07/2016 FINDINGS: The lungs are well-aerated. A small right pleural effusion is noted, improved from the prior study. Mild right basilar opacity likely reflects atelectasis. There is no evidence of pneumothorax. The cardiomediastinal silhouette is mildly enlarged. No acute osseous abnormalities are seen. External pacing pads are noted. IMPRESSION: Small right pleural effusion is improved from the prior study. Mild right basilar airspace opacity likely reflects atelectasis. Mild cardiomegaly. Electronically Signed   By: Garald Balding M.D.   On: 11/16/2016 17:23   STUDIES:  None  CULTURES: Urine 01/29>> Blood 01/29>>  ANTIBIOTICS: None   SIGNIFICANT EVENTS: 01/29-Pt admitted to [facility] ICU with hypotension and junctional rhythm secondary to antiarrhythmic and beta blocker medication  LINES/TUBES: PIV's x2 01/29>>  ASSESSMENT /  PLAN:  PULMONARY A: Acute respiratory failure Hx: Emphysema P:   Supplemental O2 to maintain O2 sats >92% Prn CXR and ABG's  CARDIOVASCULAR A:  Junctional Rhythm Hypotension secondary to antiarrhythmic and beta blocker medication Hx: Hypertension, CHF, CAD, PVD,and Hyperlipidemia,  P:  Continuous telemetry monitoring Glucogon gtt Cardiology consulted appreciate input  Levophed gtt to maintain map >65 EKG in am and prn  Hold outpatient lasix, carvedilol, and diltiazem  RENAL A:   Acute on chronic renal failure secondary to hypotension (baseline creatinine 1.6) Hx: Renal disorder P:   Trend BMP's Replace electrolytes as indicated Monitor UOP  GASTROINTESTINAL A:   Mild right lower quadrant abdominal pain Hx: GERD  P:   IV protonix for GERD Keep NPO for now will start diet once more alert  STAT KUB  HEMATOLOGIC A:   Anemia  P:  Heparin for VTE prophylaxis  Trend CBC's Monitor for s/sx of bleeding Transfuse for hgb <7  INFECTIOUS A:   No acute issues  P:   Trend WBC and monitor fever curve Trend PCT's and lactic acid If pt develops leukocytosis or becomes febrile will start empiric abx Follow cultures   ENDOCRINE A:   Hyperglycemia P:   CBG's q4hrs SSI   NEUROLOGIC A:   No acute issues P:   Avoid sedating medications Frequent reorientation Prn tylenol for pain management Lights on during the day Promote family presence at bedside    FAMILY  - Updates: Spoke with pts daughter and granddaughter about plan of care and questions answered.  Discussed code status with pts daughter she stated her mother is a DNR/DNI she understands if her mother were to cardiac or respiratory arrest we would not mechanically intubate, perform CPR, administer ACLS medications, or perform defibrillation.  The daughter stated her mother would not want any of these interventions and clearly stated her mother wants to remain a DNR/DNI, therefore code status changed to  DNR.  - Inter-disciplinary family meet or Palliative Care meeting due by:  11/23/2016  Marda Stalker, [place] Pager [phone] (please enter 7 digits) [place] Pager [phone] (please enter 7 digits)   Merton Border, MD PCCM service Mobile [phone] Pager [phone] 11/17/2016

## 2016-11-16 NOTE — ED Notes (Signed)
Pt arrived to ED with dressing on right forarm that was reported to have last been changed 1 month ago. When dressing was removed skin tear was completely healed. No bleeding or discoloration noted at this time.

## 2016-11-16 NOTE — ED Provider Notes (Addendum)
[facility] Emergency Department Provider Note   ____________________________________________   First MD Initiated Contact with Patient 11/16/16 1606     (approximate)  I have reviewed the triage vital signs and the nursing notes.   HISTORY  Chief Complaint No chief complaint on file.  Chief complaint is weakness  HPI SHAWNTELL MORSEY is a 83 y.o. female patient from assisted living was found by caregiver today to be very weak lying in the bed. Patient's blood pressure was low and her heart rate was in the 20s. Patient came to the emergency room. Patient says she is just feeling weak with a little bit short of breath and has no chest pain or anything else. She was recently in the hospital here for pneumonia. She is not sure when this started.   Past Medical History:  Diagnosis Date  . Anemia   . CHF (congestive heart failure) ([place])   . Coronary artery disease   . Emphysema lung ([place])   . Emphysema of lung ([place])   . GERD (gastroesophageal reflux disease)   . Hyperlipidemia   . Hypertension   . Neuropathy ([place])   . Osteoporosis   . Peripheral vascular disease ([place])   . Renal disorder   . Stroke [facility])     Patient Active Problem List   Diagnosis Date Noted  . Atrial fibrillation, rapid ([place]) 11/07/2016  . CAP (community acquired pneumonia) 11/07/2016  . Acute CHF (congestive heart failure) ([place]) 11/07/2016  . Accelerated hypertension 11/07/2016  . Generalized anxiety disorder 09/26/2016  . PAD (peripheral artery disease) ([place]) 10/05/2013  . Chronic venous insufficiency 10/05/2013    Past Surgical History:  Procedure Laterality Date  . ABDOMINAL HYSTERECTOMY  1982  . CATARACT EXTRACTION    . CHOLECYSTECTOMY  1992  . JOINT REPLACEMENT  2005    Prior to Admission medications   Medication Sig Start Date End Date Taking? Authorizing Provider  acetaminophen (TYLENOL) 325 MG tablet Take 650 mg by mouth every 6 (six) hours as needed for mild  pain or moderate pain.    Historical Provider, MD  ALPRAZolam Duanne Moron) 0.25 MG tablet Take 0.25 mg by mouth 2 (two) times daily as needed for anxiety or agitation. 08/07/16   Historical Provider, MD  aspirin 81 MG tablet Take 81 mg by mouth daily.    Historical Provider, MD  carvedilol (COREG) 12.5 MG tablet Take 1 tablet (12.5 mg total) by mouth 2 (two) times daily. 10/06/16 10/06/17  Schuyler Amor, MD  celecoxib (CELEBREX) 100 MG capsule Take 100 mg by mouth every other day as needed. 10/15/16   Historical Provider, MD  Cholecalciferol 2000 units CAPS Take 4,000 Units by mouth daily.     Historical Provider, MD  clopidogrel (PLAVIX) 75 MG tablet Take 1 tablet by mouth daily. 09/18/13   Historical Provider, MD  cyanocobalamin 1000 MCG tablet Take 1,000 mcg by mouth daily.     Historical Provider, MD  dicyclomine (BENTYL) 20 MG tablet Take 1 tablet (20 mg total) by mouth 3 (three) times daily as needed for spasms. 11/28/15   Daymon Larsen, MD  diltiazem (CARDIZEM CD) 240 MG 24 hr capsule Take 1 capsule (240 mg total) by mouth daily. 11/09/16   [name], MD  FLECTOR 1.3 % PTCH Place 1 patch onto the skin every 12 (twelve) hours.  09/18/13   Historical Provider, MD  furosemide (LASIX) 20 MG tablet Take 1 tablet (20 mg total) by mouth daily. Patient not taking: Reported on  11/07/2016 10/06/16   Schuyler Amor, MD  gabapentin (NEURONTIN) 300 MG capsule Take 1 capsule by mouth 3 (three) times daily.  09/04/13   Historical Provider, MD  loratadine (CLARITIN) 10 MG tablet Take 10 mg by mouth daily.    Historical Provider, MD  nitroGLYCERIN (NITRODUR - DOSED IN MG/24 HR) 0.2 mg/hr patch Place 1 patch onto the skin daily.  09/18/13   Historical Provider, MD  ondansetron (ZOFRAN) 4 MG tablet Take 1 tablet (4 mg total) by mouth every 8 (eight) hours as needed for nausea or vomiting. 11/28/15   Daymon Larsen, MD  potassium chloride SA (K-DUR,KLOR-CON) 10 MEQ tablet Take 1 tablet (10 mEq total) by  mouth daily. 11/09/16   [name], MD  Probiotic Product (ALIGN) 4 MG CAPS Take 4 mg by mouth daily.    Historical Provider, MD  ranitidine (ZANTAC) 150 MG capsule Take 150 mg by mouth 2 (two) times daily.    Historical Provider, MD  sennosides-docusate sodium (SENOKOT-S) 8.6-50 MG tablet Take 2 tablets by mouth daily.    Historical Provider, MD  traMADol (ULTRAM) 50 MG tablet Take 50 mg by mouth every 4 (four) hours as needed for moderate pain or severe pain.    Historical Provider, MD    Allergies Morphine and related and Zoloft [sertraline hcl]  Family History  Problem Relation Age of Onset  . Colon cancer Sister     Social History Social History  Substance Use Topics  . Smoking status: Never Smoker  . Smokeless tobacco: Never Used  . Alcohol use No    Review of Systems Constitutional: No fever/chills Eyes: No visual changes. ENT: No sore throat. Cardiovascular: Denies chest pain. Respiratory: Denies shortness of breath. Gastrointestinal: No abdominal pain.  No nausea, no vomiting.  No diarrhea.  No constipation. Genitourinary: Negative for dysuria. Musculoskeletal: Negative for back pain. Skin: Negative for rash. Neurological: Negative for headaches, focal weakness or numbness.  10-point ROS otherwise negative.  ____________________________________________   PHYSICAL EXAM:  VITAL SIGNS: ED Triage Vitals  Enc Vitals Group     BP 11/16/16 1609 (!) 78/19     Pulse Rate 11/16/16 1609 (!) 29     Resp 11/16/16 1609 14     Temp --      Temp src --      SpO2 --      Weight 11/16/16 1620 151 lb (68.5 kg)     Height 11/16/16 1620 5\' 6"  (1.676 m)     Head Circumference --      Peak Flow --      Pain Score --      Pain Loc --      Pain Edu? --      Excl. in [place]? --     Constitutional: Alert and oriented. Looks very weak lying back in the bed Eyes: Conjunctivae are normal. PERRL. EOMI. Head: Atraumatic. Nose: No congestion/rhinnorhea. Mouth/Throat:  Mucous membranes are moist.  Oropharynx non-erythematous. Neck: No stridor. Cardiovascular: Slow rate, regular rhythm. Grossly normal heart sounds.  Good peripheral circulation. Respiratory: Normal respiratory effort.  No retractions. Lungs CTAB. Gastrointestinal: Soft and nontender. No distention. No abdominal bruits. No CVA tenderness. Musculoskeletal: Both lower extremities wrapped in Unna boots there is no apparent edema but is hard to tell. No joint effusions. Neurologic:  Normal speech and language. No gross focal neurologic deficits are appreciated. No gait instability. Skin:  Patient is pale there is no area on the right wrist where the skin was torn  a couple weeks ago per the daughter which is red and swollen looking   ____________________________________________   LABS (all labs ordered are listed, but only abnormal results are displayed)  Labs Reviewed  COMPREHENSIVE METABOLIC PANEL - Abnormal; Notable for the following:       Result Value   CO2 21 (*)    Glucose, Bld 157 (*)    BUN 43 (*)    Creatinine, Ser 2.27 (*)    Calcium 8.6 (*)    Total Protein 6.3 (*)    Albumin 3.0 (*)    ALT 10 (*)    GFR calc non Af Amer 18 (*)    GFR calc Af Amer 21 (*)    Anion gap 4 (*)    All other components within normal limits  CBC WITH DIFFERENTIAL/PLATELET - Abnormal; Notable for the following:    RBC 3.04 (*)    Hemoglobin 9.9 (*)    HCT 30.1 (*)    Platelets 149 (*)    Monocytes Absolute 1.0 (*)    All other components within normal limits  BRAIN NATRIURETIC PEPTIDE - Abnormal; Notable for the following:    B Natriuretic Peptide 242.0 (*)    All other components within normal limits  GLUCOSE, CAPILLARY - Abnormal; Notable for the following:    Glucose-Capillary 178 (*)    All other components within normal limits  TROPONIN I   ____________________________________________  EKG  EKG shows junctional rhythm rate of 62 there is ST segment downsloping T-wave inversion in 1  and L. This is new from previous EKGs. ____________________________________________  RADIOLOGY  Study Result   CLINICAL DATA:  Acute onset of bradycardia.  Initial encounter.  EXAM: CHEST 1 VIEW  COMPARISON:  Chest radiograph performed 11/07/2016  FINDINGS: The lungs are well-aerated. A small right pleural effusion is noted, improved from the prior study. Mild right basilar opacity likely reflects atelectasis. There is no evidence of pneumothorax.  The cardiomediastinal silhouette is mildly enlarged. No acute osseous abnormalities are seen. External pacing pads are noted.  IMPRESSION: Small right pleural effusion is improved from the prior study. Mild right basilar airspace opacity likely reflects atelectasis. Mild cardiomegaly.   Electronically Signed   By: Garald Balding M.D.   On: 11/16/2016 17:23     ____________________________________________   PROCEDURES  Procedure(s) performed  Procedures  Critical Care performed:   ____________________________________________   INITIAL IMPRESSION / ASSESSMENT AND PLAN / ED COURSE  Pertinent labs & imaging results that were available during my care of the patient were reviewed by me and considered in my medical decision making (see chart for details).  Patient given half milligram of atropine and another half milligram atropine heart rate comes up and ranges between 50 and 80. Patient's blood pressure goes up to 91 systolic patient says she feels somewhat better now.    discussed with Dr. call wood who comes in and he feels that this is a combination of the patient's beta blocker and calcium channel blocker which is overwhelmed her and thinks it with fluids and pressors may be her blood pressure and heart rate will come up over time. She's had a liter fluid she's getting the second half liter fluid will give her another full liter fluid after that. We have started her on the dopamine at 0.2 mics per minute we'll  titrate this up rapidly to really get to what is the usual therapeutic dose of about 8 mics per minute. Dr. Noel Journey also suggested giving her calcium  gluconate which we have done and has not worked we will give her some glucagon IV and started glucagon drip and see if that will help. Meantime patient will be admitted to the ICU. ICU has been contacted   ____________________________________________   FINAL CLINICAL IMPRESSION(S) / ED DIAGNOSES  Final diagnoses:  Bradycardia      NEW MEDICATIONS STARTED DURING THIS VISIT:  New Prescriptions   No medications on file     Note:  This document was prepared using Dragon voice recognition software and may include unintentional dictation errors.    Nena Polio, MD 11/16/16 1823  Blood pressure and heart rate seemed be going up after the listed on. We'll have tapered Marlou Sa Levothroid down slightly we'll try and taper down little bit more.   Nena Polio, MD 11/16/16 [phone]

## 2016-11-16 NOTE — ED Triage Notes (Signed)
Pt arrived to ED reporting weakness. Pt is currently hypotensive and bradycardic at 29 BPM. Pt is alert at this time and able to answer questions

## 2016-11-16 NOTE — ED Notes (Signed)
This RN called Hinton Dyer, NP and verbalized pts development off medication drips and updated on pts condition. NP verbalized she would assess pts placement.

## 2016-11-16 NOTE — ED Notes (Signed)
Per daughter pt lives at [facility] assisted ;iving. Pt reports becoming SOB and weak all of a sudden. Staff called EMS and pt was brought into ED.

## 2016-11-17 ENCOUNTER — Ambulatory Visit: Payer: Medicare Other | Admitting: Family

## 2016-11-17 LAB — URINALYSIS, ROUTINE W REFLEX MICROSCOPIC
BILIRUBIN URINE: NEGATIVE
Glucose, UA: NEGATIVE mg/dL
Hgb urine dipstick: NEGATIVE
KETONES UR: NEGATIVE mg/dL
Leukocytes, UA: NEGATIVE
Nitrite: NEGATIVE
PROTEIN: 30 mg/dL — AB
Specific Gravity, Urine: 1.015 (ref 1.005–1.030)
pH: 5 (ref 5.0–8.0)

## 2016-11-17 LAB — BASIC METABOLIC PANEL
ANION GAP: 7 (ref 5–15)
Anion gap: 8 (ref 5–15)
BUN: 51 mg/dL — AB (ref 6–20)
BUN: 47 mg/dL — AB (ref 6–20)
CHLORIDE: 109 mmol/L (ref 101–111)
CO2: 20 mmol/L — ABNORMAL LOW (ref 22–32)
CO2: 21 mmol/L — AB (ref 22–32)
CREATININE: 2.05 mg/dL — AB (ref 0.44–1.00)
Calcium: 9.1 mg/dL (ref 8.9–10.3)
Calcium: 9.2 mg/dL (ref 8.9–10.3)
Chloride: 111 mmol/L (ref 101–111)
Creatinine, Ser: 2.18 mg/dL — ABNORMAL HIGH (ref 0.44–1.00)
GFR calc Af Amer: 23 mL/min — ABNORMAL LOW (ref >60)
GFR calc non Af Amer: 20 mL/min — ABNORMAL LOW (ref >60)
GFR, EST AFRICAN AMERICAN: 22 mL/min — AB (ref >60)
GFR, EST NON AFRICAN AMERICAN: 19 mL/min — AB (ref >60)
GLUCOSE: 109 mg/dL — AB (ref 65–99)
Glucose, Bld: 111 mg/dL — ABNORMAL HIGH (ref 65–99)
POTASSIUM: 5.5 mmol/L — AB (ref 3.5–5.1)
POTASSIUM: 5 mmol/L (ref 3.5–5.1)
SODIUM: 138 mmol/L (ref 135–145)
SODIUM: 138 mmol/L (ref 135–145)

## 2016-11-17 LAB — CBC
HCT: 30.6 % — ABNORMAL LOW (ref 35.0–47.0)
HEMOGLOBIN: 10 g/dL — AB (ref 12.0–16.0)
MCH: 32.4 pg (ref 26.0–34.0)
MCHC: 32.7 g/dL (ref 32.0–36.0)
MCV: 99.1 fL (ref 80.0–100.0)
PLATELETS: 142 10*3/uL — AB (ref 150–440)
RBC: 3.09 MIL/uL — ABNORMAL LOW (ref 3.80–5.20)
RDW: 14.5 % (ref 11.5–14.5)
WBC: 8.7 10*3/uL (ref 3.6–11.0)

## 2016-11-17 LAB — MAGNESIUM: MAGNESIUM: 2 mg/dL (ref 1.7–2.4)

## 2016-11-17 LAB — GLUCOSE, CAPILLARY
GLUCOSE-CAPILLARY: 98 mg/dL (ref 65–99)
GLUCOSE-CAPILLARY: 96 mg/dL (ref 65–99)
GLUCOSE-CAPILLARY: 130 mg/dL — AB (ref 65–99)
Glucose-Capillary: 105 mg/dL — ABNORMAL HIGH (ref 65–99)
Glucose-Capillary: 109 mg/dL — ABNORMAL HIGH (ref 65–99)

## 2016-11-17 LAB — TROPONIN I
Troponin I: <0.03 ng/mL (ref <0.03)
Troponin I: <0.03 ng/mL (ref <0.03)

## 2016-11-17 LAB — PHOSPHORUS: PHOSPHORUS: 4.3 mg/dL (ref 2.5–4.6)

## 2016-11-17 LAB — PROCALCITONIN: Procalcitonin: 13.1 ng/mL

## 2016-11-17 MED ORDER — CARVEDILOL 12.5 MG PO TABS
12.5000 mg | ORAL_TABLET | Freq: Two times a day (BID) | ORAL | Status: DC
  Administered 2016-11-17 – 2016-11-18 (×2): 12.5 mg via ORAL
  Filled 2016-11-17 (×2): qty 1

## 2016-11-17 MED ORDER — INSULIN ASPART 100 UNIT/ML ~~LOC~~ SOLN
0.0000 [IU] | Freq: Three times a day (TID) | SUBCUTANEOUS | Status: DC
  Administered 2016-11-18: 1 [IU] via SUBCUTANEOUS
  Filled 2016-11-17: qty 1

## 2016-11-17 MED ORDER — ENSURE ENLIVE PO LIQD
237.0000 mL | Freq: Two times a day (BID) | ORAL | Status: DC
  Administered 2016-11-17 – 2016-11-18 (×3): 237 mL via ORAL

## 2016-11-17 MED ORDER — INSULIN ASPART 100 UNIT/ML ~~LOC~~ SOLN: 0.0000 [IU] | Freq: Every day | SUBCUTANEOUS | Status: DC

## 2016-11-17 NOTE — Progress Notes (Signed)
[facility] [place] NAME: Tharon Bolas    MR#:  [id number]  DATE OF BIRTH:  August 20, 1923  SUBJECTIVE:  CHIEF COMPLAINT:  Patient seen and examined at bedside. Patient was downgraded from ICU to telemetry overnight after being stable off pressors. She offers no complaints this morning. States that she is feeling well. Is ready for her physical therapy evaluation and is hungry for lunch. Patient states that she has been urinating and has had a bowel movement.  Per CCM history and physical "This is a 83 yo female with a PMH of Stroke, Renal disorder, PVD, Osteoporosis, Neuropathy, HTN, Hyperlipidemia, GERD, Emphysema, CAD, Anemia, Left humerus fracture s/p ORIF (08/2016) and CHF.  She presented to [facility] ER 01/29 from [facility] with c/o generalized weakness and mild shortness of breath onset of symptoms 01/29.  She was recently discharged from the hospital and treated for Pneumonia.  Pt arrived to ER via EMS and upon arrival the pt was found to be hypotensive systolic bp [id number] and EKG revealed pt was in a junctional bradycardic rhythm with heart rate in the 20's. She does take scheduled po cardizem and carvedilol most recent dose 01/29. In the ER she received 2L NS fluid bolus and calcium gluconate with no improvement of symptoms, therefore a glucagon and neosynephrine gtt was initiated.  PCCM contacted 01/29 to admit pt to ICU for hypotension and junctional rhythm secondary to antiarrhythmic and beta blocker medication."  REVIEW OF SYSTEMS:  ROS  CONSTITUTIONAL: No fever/chills, fatigue, weakness, weight gain/loss, headache. EYES: No blurry or double vision. ENT: No tinnitus, postnasal drip, redness or soreness of the oropharynx. RESPIRATORY: No cough, dyspnea, wheeze, hemoptysis.  CARDIOVASCULAR: No chest pain, palpitations, syncope, orthopnea,  GASTROINTESTINAL: No nausea, vomiting, constipation, diarrhea, abdominal pain, hematemesis,  melena or hematochezia. GENITOURINARY: No dysuria, frequency, hematuria. ENDOCRINE: No polyuria or nocturia. No heat or cold intolerance. HEMATOLOGY: No anemia, bruising, bleeding. INTEGUMENTARY: No rashes, ulcers, lesions. MUSCULOSKELETAL: No arthritis, gout, dyspnea.  NEUROLOGIC: No numbness, tingling, ataxia, seizure-type activity, weakness. PSYCHIATRIC: No anxiety, depression, insomnia.   DRUG ALLERGIES:   Allergies  Allergen Reactions  . Cholesterol Other (See Comments)    Patient states she is allergic to a cholesterol medication, which causes flu-like symptoms. Name is unknown.  . Morphine And Related     Heart problems   . Zoloft [Sertraline Hcl] Swelling   VITALS:  Blood pressure (!) 140/58, pulse 68, temperature 97.8 F (36.6 C), temperature source Oral, resp. rate 18, height 5\' 6"  (1.676 m), weight 73.6 kg (162 lb 3.2 oz), SpO2 99 %. PHYSICAL EXAMINATION:  Physical Exam  PHYSICAL EXAMINATION: VITAL SIGNS: GENERAL: 83 y.o.-year-old pale white female patient, well-developed, well-nourished lying in the bed in no acute distress.  Pleasant and cooperative.   HEENT: Head atraumatic, normocephalic. Pupils equal, round, reactive to light and accommodation. No scleral icterus. Extraocular muscles intact. Nares are patent. Oropharynx is clear. Mucus membranes moist. NECK: Supple, full range of motion. No JVD, no bruit heard. No thyroid enlargement, no tenderness, no cervical lymphadenopathy. CHEST: Normal breath sounds bilaterally. No wheezing, rales, rhonchi or crackles. No use of accessory muscles of respiration.  No reproducible chest wall tenderness.  CARDIOVASCULAR: S1, S2 normal. No murmurs, rubs, or gallops. Cap refill <2 seconds. ABDOMEN: Soft, nontender, nondistended. No rebound, guarding, rigidity. Normoactive bowel sounds present in all four quadrants. No organomegaly or mass. EXTREMITIES: Full range of motion. No pedal edema, cyanosis, or clubbing. NEUROLOGIC: A&Ox3  Cranial  nerves II through XII are grossly intact with no focal sensorimotor deficit. Muscle strength 5/5 in all extremities. Sensation intact. Gait not checked. LABORATORY PANEL:   CBC  Recent Labs Lab 11/17/16 0703  WBC 8.7  HGB 10.0*  HCT 30.6*  PLT 142*   ------------------------------------------------------------------------------------------------------------------ Chemistries   Recent Labs Lab 11/16/16 1604  11/17/16 0703  NA 136  --  138  K 5.1  --  5.5*  CL 111  --  111  CO2 21*  --  20*  GLUCOSE 157*  --  111*  BUN 43*  --  51*  CREATININE 2.27*  < > 2.18*  CALCIUM 8.6*  --  9.1  MG  --   --  2.0  AST 20  --   --   ALT 10*  --   --   ALKPHOS 107  --   --   BILITOT 0.7  --   --   < > = values in this interval not displayed. RADIOLOGY:  Dg Chest 1 View  Result Date: 11/16/2016 CLINICAL DATA:  Acute onset of bradycardia.  Initial encounter. EXAM: CHEST 1 VIEW COMPARISON:  Chest radiograph performed 11/07/2016 FINDINGS: The lungs are well-aerated. A small right pleural effusion is noted, improved from the prior study. Mild right basilar opacity likely reflects atelectasis. There is no evidence of pneumothorax. The cardiomediastinal silhouette is mildly enlarged. No acute osseous abnormalities are seen. External pacing pads are noted. IMPRESSION: Small right pleural effusion is improved from the prior study. Mild right basilar airspace opacity likely reflects atelectasis. Mild cardiomegaly. Electronically Signed   By: Garald Balding M.D.   On: 11/16/2016 17:23   Dg Abd 1 View  Result Date: 11/16/2016 CLINICAL DATA:  Abdominal pain. EXAM: ABDOMEN - 1 VIEW COMPARISON:  None. FINDINGS: Moderate amount of stool in the colon. There is no bowel dilatation to suggest obstruction. There is no evidence of pneumoperitoneum, portal venous gas or pneumatosis. There are no pathologic calcifications along the expected course of the ureters. The osseous structures are unremarkable.  IMPRESSION: Moderate amount of stool in the colon. Electronically Signed   By: Kathreen Devoid   On: 11/16/2016 19:17   ASSESSMENT AND PLAN:   This is a 83 year old white female with history of CHF, HTN, CAD, PVD, HLD, COPD, anemia now admitted with:  1. Hypotension and junctional rhythm secondary to medication (beta blocker, calcium channel blocker and nitroglycerin patch). History of CHF, coronary artery disease -Continue telemetry monitoring. Patient's vital signs have been stable -Hold beta blocker and calcium channel blocker for now.  -Cardiology consultation pending. -Resume aspirin, Plavix, sublingual nitroglycerin as needed. -Resume diet -Physical therapy evaluation for disposition planning  2. Acute kidney injury on chronic renal failure likely secondary to #1. Baseline creatinine is 1.6 -Slowly improving -Repeat BMP in a.m.  3. Hyperkalemia -Repeat BMP mid-day and treat accordingly -Hold by mouth potassium  4. History of Anemia, chronic, stable. -Follow CBC  5. History of COPD -O2 and med nebs as needed  6. History of GERD -Resume Zantac  7. History of hyperlipidemia, not currently medicated -Check lipids  8. History of CVA -Resume Plavix -Resume aspirin  9. History of osteoarthritis -Hold Celebrex secondary to AKI -Continue Flector patch, Neurontin, Ultram   All the records are reviewed and case discussed with Care Management/Social Worker. Management plans discussed with the patient, family and they are in agreement.  CODE STATUS: DO NOT RESUSCITATE  TOTAL TIME TAKING CARE OF THIS PATIENT: 25 minutes.  More than 50% of the time was spent in counseling/coordination of care: YES  POSSIBLE D/C IN 1-2 DAYS, DEPENDING ON CLINICAL CONDITION.   Sahvanna Mcmanigal D.O. on 11/17/2016 at 12:53 PM  Between 7am to 6pm - Pager - [phone]  After 6pm go to [email or web address] - Proofreader  [facility]  Office  [phone]  CC:  Primary care physician; Cletis Athens, MD  Note: This dictation was prepared with Dragon dictation along with smaller phrase technology. Any transcriptional errors that result from this process are unintentional.

## 2016-11-17 NOTE — Progress Notes (Signed)
Patient reported having difficulty swallowing, on call physician notified. Patient placed NPO until swallow eval is completed. Patient came in last night from assisted living facility and is currently wearing unaboots. MD notified of this as well. No orders to remove at this time until patient is seen by physician.

## 2016-11-17 NOTE — Evaluation (Signed)
Physical Therapy Evaluation Patient Details Name: [name] [name] MRN: [id number] DOB: [birth year] Today's Date: 11/17/2016   History of Present Illness  83 y.o. female presenting to hospital with hypotension and bradycardia (HR in the 20s BPM) she was here last week with SOB, generalized weakness, and chest tightness and admitted to hospital with rapid a-fib, CAP, acute CHF, and htn.  PMH includes subacute/chronic L humeral fx, CHF, COPD, htn, anemia.  Clinical Impression  Pt generally feeling weak and not initially interested in working with PT, but does agree to do something and actually shows decent strength and mobility (apart from limitations from L UE being immobilized).  She reported that she would not try to stand/walk today but that she may be willing to try in the next few days if she's feeling better.  Pt apparently has done little to no walking since her fall in November but does indicated a willingness to work with PT here and at her ALF to work back to some mobility.  Pt nauseated with sitting for a few minutes today and did not feel confident to even try standing with assist.      Follow Up Recommendations Home health PT;Supervision/Assistance - 24 hour (apparently she has exhausted her rehab days this year?)   Equipment Recommendations   (hemiwalker?)    Recommendations for Other Services       Precautions / Restrictions Precautions Precautions: Fall Required Braces or Orthoses: Sling;Other Brace/Splint Other Brace/Splint: Hard brace L UE  Restrictions LUE Weight Bearing: Non weight bearing      Mobility  Bed Mobility Overal bed mobility: Needs Assistance Bed Mobility: Supine to Sit;Sit to Supine     Supine to sit: Min assist Sit to supine: Min assist   General bed mobility comments: pt with slow, deliberate effort getting to EOB and though she did need some assist it was not excessive  Transfers                 General transfer comment: deferred attempts  at standing today per pt request, but pt indicates that she'd be willing to try next time  Ambulation/Gait                Stairs            Wheelchair Mobility    Modified Rankin (Stroke Patients Only)       Balance   Sitting-balance support: Single extremity supported;Feet unsupported Sitting balance-Leahy Scale: Fair                                       Pertinent Vitals/Pain Pain Assessment:  (no pain at rest, has some L arm pain with movement)    Home Living Family/patient expects to be discharged to:: Assisted living                 Additional Comments: Pt lives at [facility].    Prior Function Level of Independence: Needs assistance         Comments: Pt reports 1 fall in past 6 months (Nov 1st 2017 pt fell breaking L humerus in 2 places).  Wears L glove for edema.  Reports that she has been unable to do any real walking X ~3 months and ultimately is very weak/limited.     Hand Dominance        Extremity/Trunk Assessment   Upper Extremity Assessment Upper Extremity Assessment:  (deferred  L UE testing, R WFL with age appropriate deficits)    Lower Extremity Assessment Lower Extremity Assessment: Generalized weakness;Overall WFL for tasks assessed (age appropriate limitations, grossly 4-/5 t/o)       Communication   Communication: HOH;No difficulties  Cognition Arousal/Alertness: Awake/alert Behavior During Therapy: WFL for tasks assessed/performed Overall Cognitive Status: Within Functional Limits for tasks assessed                      General Comments      Exercises     Assessment/Plan    PT Assessment Patient needs continued PT services  PT Problem List Decreased strength;Decreased balance;Decreased mobility          PT Treatment Interventions DME instruction;Gait training;Functional mobility training;Therapeutic activities;Therapeutic exercise;Balance training;Patient/family education     PT Goals (Current goals can be found in the Care Plan section)  Acute Rehab PT Goals Patient Stated Goal: to be able to walk again PT Goal Formulation: With patient Time For Goal Achievement: 12/01/16 Potential to Achieve Goals: Fair    Frequency Min 2X/week   Barriers to discharge        Co-evaluation               End of Session Equipment Utilized During Treatment: Gait belt Activity Tolerance: Patient limited by fatigue Patient left: with bed alarm set;with call bell/phone within reach           Time: 1329-1357 PT Time Calculation (min) (ACUTE ONLY): 28 min   Charges:   PT Evaluation $PT Eval Low Complexity: 1 Procedure     PT G CodesKreg Shropshire, DPT 11/17/2016, 2:35 PM

## 2016-11-17 NOTE — Progress Notes (Signed)
Patient blood pressure elevated this shift, last reading 169/69. MD Jannifer Franklin notified. MD to place orders. Will administer as ordered.   [name] [name]

## 2016-11-17 NOTE — Care Management (Signed)
Patient presents from [place] and followed by [facility] PT and OT.  Discussed need for PT consult.  SLP consult pending.  [facility] is aware of admission. Patient presented to the ED with weakness.  Found to have a heart rate of 29 and was hypotensive.  Current 02 requirement is acute.  Discussed the need for physical therapy consult and to wean and or assess for home 02.

## 2016-11-17 NOTE — Evaluation (Signed)
Clinical/Bedside Swallow Evaluation Patient Details  Name: [name] [name] MRN: [id number] Date of Birth: [birth year]  Today's Date: 11/17/2016 Time: SLP Start Time (ACUTE ONLY): 1500 SLP Stop Time (ACUTE ONLY): 1600 SLP Time Calculation (min) (ACUTE ONLY): 60 min  Past Medical History:  Past Medical History:  Diagnosis Date  . Anemia   . CHF (congestive heart failure) ([place])   . Coronary artery disease   . Emphysema lung ([place])   . Emphysema of lung ([place])   . GERD (gastroesophageal reflux disease)   . Hyperlipidemia   . Hypertension   . Neuropathy ([place])   . Osteoporosis   . Peripheral vascular disease ([place])   . Renal disorder   . Stroke [facility])    Past Surgical History:  Past Surgical History:  Procedure Laterality Date  . ABDOMINAL HYSTERECTOMY  1982  . CATARACT EXTRACTION    . CHOLECYSTECTOMY  1992  . JOINT REPLACEMENT  2005   HPI:  Pt 83 yo female with a PMH of Stroke, Renal disorder, PVD, Osteoporosis, Neuropathy, HTN, Hyperlipidemia, GERD, Emphysema, CAD, Anemia, Left humerus fracture s/p ORIF (08/2016) and CHF.  She presented to [facility] ER 01/29 from [facility] with c/o generalized weakness and mild shortness of breath onset of symptoms 01/29.  She was recently discharged from the hospital and treated for Pneumonia.  Pt arrived to ER via EMS and upon arrival the pt was found to be hypotensive systolic bp [id number] and EKG revealed pt was in a junctional bradycardic rhythm with heart rate in the 20's. She does take scheduled po cardizem and carvedilol most recent dose 01/29. In the ER she received 2L NS fluid bolus and calcium gluconate with no improvement of symptoms, therefore a glucagon and neosynephrine gtt was initiated.  PCCM contacted 01/29 to admit pt to ICU for hypotension and junctional rhythm secondary to antiarrhythmic and beta blocker medication. Currently, pt with note of always "coughing and getting choked" for as long as she remembers.     Assessment / Plan  / Recommendation Clinical Impression  Pt appears at mild- moderate risk for aspiration following aspiration precautions; moreso with thin liquids. Pt consumed trials of thin liquids via straw sips (pt insisted on using straw). Pt did not present with any immediate s/sx of aspiration, however post swallowing, pt did cough X2 trials. Pt stated this is what happens "all the time" for "years". Pt was unsure when this began or if it was d/t any medical issue, but did note she was told she might have had a "slight stroke years ago". Pt educated throughouly on aspiration precautions and swallow strategy of dry swallow after every sip. No s/sx of aspiration noted on X3 trials of thin liquids following dry swallow strategy. No coughing or significant oral phase deficits noted when given trials of purees and soft solids. Recommend dysphagia level 3 diet with thins following aspiration precautions including dry swallow strategy, pills in puree (baseline for pt), tray set up at meals. Noted at end of session, pt declined meals but requested Ensure.      Aspiration Risk  Mild aspiration risk;Moderate aspiration risk    Diet Recommendation   Dysphagia 3 diet (mech soft) with thin liquids following general aspiration precautions- MUST use a dry swallow after each sip/bite. Straw if requested. Meds in puree (per pts baseline report)  Medication Administration: Whole meds with puree    Other  Recommendations Recommended Consults:  (dietician) Oral Care Recommendations: Oral care BID;Staff/trained caregiver to provide oral care  Follow up Recommendations  (TBD)      Frequency and Duration min 3x week  2 weeks       Prognosis Prognosis for Safe Diet Advancement: Fair      Swallow Study   General Date of Onset: 11/16/16 HPI: Pt 83 yo female with a PMH of Stroke, Renal disorder, PVD, Osteoporosis, Neuropathy, HTN, Hyperlipidemia, GERD, Emphysema, CAD, Anemia, Left humerus fracture s/p ORIF (08/2016) and CHF.   She presented to [facility] ER 01/29 from [facility] with c/o generalized weakness and mild shortness of breath onset of symptoms 01/29.  She was recently discharged from the hospital and treated for Pneumonia.  Pt arrived to ER via EMS and upon arrival the pt was found to be hypotensive systolic bp [id number] and EKG revealed pt was in a junctional bradycardic rhythm with heart rate in the 20's. She does take scheduled po cardizem and carvedilol most recent dose 01/29. In the ER she received 2L NS fluid bolus and calcium gluconate with no improvement of symptoms, therefore a glucagon and neosynephrine gtt was initiated.  PCCM contacted 01/29 to admit pt to ICU for hypotension and junctional rhythm secondary to antiarrhythmic and beta blocker medication. Currently, pt with note of always "coughing and getting choked" for as long as she remembers.   Type of Study: Bedside Swallow Evaluation Previous Swallow Assessment: none noted Diet Prior to this Study: Regular;Thin liquids Temperature Spikes Noted: No Respiratory Status: Nasal cannula History of Recent Intubation: No Behavior/Cognition: Alert;Cooperative;Distractible (pt mildly distracted with need to use restroom) Oral Cavity Assessment: Within Functional Limits Oral Care Completed by SLP: Recent completion by staff Oral Cavity - Dentition: Dentures, top;Missing dentition ((on bottom- few)) Vision: Functional for self-feeding Self-Feeding Abilities: Able to feed self;Needs assist;Needs set up Patient Positioning: Upright in bed;Postural control adequate for testing Baseline Vocal Quality: Normal Volitional Cough: Strong Volitional Swallow:  (dnt)    Oral/Motor/Sensory Function Overall Oral Motor/Sensory Function: Within functional limits   Ice Chips Ice chips: Not tested   Thin Liquid Thin Liquid: Impaired Presentation: Cup;Straw Pharyngeal  Phase Impairments: Cough - Immediate;Cough - Delayed Other Comments: Pt insisted on using straw      Nectar Thick Nectar Thick Liquid: Not tested   Honey Thick Honey Thick Liquid: Not tested   Puree Puree: Within functional limits Presentation: Spoon (X5 trials)   Solid   GO   Solid: Within functional limits Presentation: Spoon (softened solids X2 trials)        Eulogio Ditch, B.S Graduate Clinician  11/17/2016,4:08 PM    This information has been reviewed and agreed upon by this supervising clinician.  Orinda Kenner, [place], CCC-SLP

## 2016-11-18 LAB — GLUCOSE, CAPILLARY
GLUCOSE-CAPILLARY: 85 mg/dL (ref 65–99)
GLUCOSE-CAPILLARY: 122 mg/dL — AB (ref 65–99)

## 2016-11-18 LAB — CBC
HCT: 26.9 % — ABNORMAL LOW (ref 35.0–47.0)
HEMOGLOBIN: 9 g/dL — AB (ref 12.0–16.0)
MCH: 32.7 pg (ref 26.0–34.0)
MCHC: 33.6 g/dL (ref 32.0–36.0)
MCV: 97.3 fL (ref 80.0–100.0)
Platelets: 121 10*3/uL — ABNORMAL LOW (ref 150–440)
RBC: 2.76 MIL/uL — AB (ref 3.80–5.20)
RDW: 14.8 % — ABNORMAL HIGH (ref 11.5–14.5)
WBC: 6.6 10*3/uL (ref 3.6–11.0)

## 2016-11-18 LAB — BASIC METABOLIC PANEL
Anion gap: 7 (ref 5–15)
BUN: 45 mg/dL — AB (ref 6–20)
CO2: 23 mmol/L (ref 22–32)
CREATININE: 1.9 mg/dL — AB (ref 0.44–1.00)
Calcium: 9.2 mg/dL (ref 8.9–10.3)
Chloride: 110 mmol/L (ref 101–111)
GFR, EST AFRICAN AMERICAN: 25 mL/min — AB (ref >60)
GFR, EST NON AFRICAN AMERICAN: 22 mL/min — AB (ref >60)
Glucose, Bld: 92 mg/dL (ref 65–99)
POTASSIUM: 4.8 mmol/L (ref 3.5–5.1)
SODIUM: 140 mmol/L (ref 135–145)

## 2016-11-18 LAB — PROCALCITONIN: PROCALCITONIN: 19.97 ng/mL

## 2016-11-18 LAB — URINE CULTURE

## 2016-11-18 MED ORDER — PANTOPRAZOLE SODIUM 40 MG PO TBEC
40.0000 mg | DELAYED_RELEASE_TABLET | Freq: Two times a day (BID) | ORAL | Status: DC
  Administered 2016-11-18: 40 mg via ORAL
  Filled 2016-11-18: qty 1

## 2016-11-18 MED ORDER — CARVEDILOL 6.25 MG PO TABS: 6.2500 mg | ORAL_TABLET | Freq: Two times a day (BID) | ORAL | Status: DC

## 2016-11-18 MED ORDER — FUROSEMIDE 40 MG PO TABS
40.0000 mg | ORAL_TABLET | Freq: Every day | ORAL | Status: DC
  Administered 2016-11-18: 40 mg via ORAL
  Filled 2016-11-18: qty 1

## 2016-11-18 NOTE — Consult Note (Signed)
Reason for Consult: Bradycardia hypotension congestive heart failure Referring Physician: Dr. Lavera Guise primary, Dr. Gwendolyn Lima critical care  [name] [name] is an 83 y.o. female.  HPI: Patient's a 83 year old female initially admitted with hypotension and bradycardia generalized weakness fatigue altered mental status. Patient complain of dyspnea generalized weakness fatigue not feeling well while minutes previous assisted living. Patient had an episode of severe bradycardia and hypotension with generalized weakness. Systolic blood pressures were in the 70s EKG had bradycardia with heart rates in the 30s patient had Cardizem and Coreg held placed on supplemental oxygen and fluid hydration patient's had improved symptoms and now transferred to telemetry.  Past Medical History:  Diagnosis Date  . Anemia   . CHF (congestive heart failure) ([place])   . Coronary artery disease   . Emphysema lung ([place])   . Emphysema of lung ([place])   . GERD (gastroesophageal reflux disease)   . Hyperlipidemia   . Hypertension   . Neuropathy ([place])   . Osteoporosis   . Peripheral vascular disease ([place])   . Renal disorder   . Stroke [facility])     Past Surgical History:  Procedure Laterality Date  . ABDOMINAL HYSTERECTOMY  1982  . CATARACT EXTRACTION    . CHOLECYSTECTOMY  1992  . JOINT REPLACEMENT  2005    Family History  Problem Relation Age of Onset  . Colon cancer Sister     Social History:  reports that she has never smoked. She has never used smokeless tobacco. She reports that she does not drink alcohol or use drugs.  Allergies:  Allergies  Allergen Reactions  . Cholesterol Other (See Comments)    Patient states she is allergic to a cholesterol medication, which causes flu-like symptoms. Name is unknown.  . Morphine And Related     Heart problems   . Zoloft [Sertraline Hcl] Swelling    Medications: I have reviewed the patient's current medications.  Results for orders placed or performed during  the hospital encounter of 11/16/16 (from the past 48 hour(s))  Comprehensive metabolic panel     Status: Abnormal   Collection Time: 11/16/16  4:04 PM  Result Value Ref Range   Sodium 136 135 - 145 mmol/L   Potassium 5.1 3.5 - 5.1 mmol/L   Chloride 111 101 - 111 mmol/L   CO2 21 (L) 22 - 32 mmol/L   Glucose, Bld 157 (H) 65 - 99 mg/dL   BUN 43 (H) 6 - 20 mg/dL   Creatinine, Ser 2.27 (H) 0.44 - 1.00 mg/dL   Calcium 8.6 (L) 8.9 - 10.3 mg/dL   Total Protein 6.3 (L) 6.5 - 8.1 g/dL   Albumin 3.0 (L) 3.5 - 5.0 g/dL   AST 20 15 - 41 U/L   ALT 10 (L) 14 - 54 U/L   Alkaline Phosphatase 107 38 - 126 U/L   Total Bilirubin 0.7 0.3 - 1.2 mg/dL   GFR calc non Af Amer 18 (L) >60 mL/min   GFR calc Af Amer 21 (L) >60 mL/min    Comment: (NOTE) The eGFR has been calculated using the CKD EPI equation. This calculation has not been validated in all clinical situations. eGFR's persistently <60 mL/min signify possible Chronic Kidney Disease.    Anion gap 4 (L) 5 - 15  Troponin I     Status: None   Collection Time: 11/16/16  4:04 PM  Result Value Ref Range   Troponin I <0.03 <0.03 ng/mL  CBC with Differential     Status: Abnormal  Collection Time: 11/16/16  4:04 PM  Result Value Ref Range   WBC 8.7 3.6 - 11.0 K/uL   RBC 3.04 (L) 3.80 - 5.20 MIL/uL   Hemoglobin 9.9 (L) 12.0 - 16.0 g/dL   HCT 30.1 (L) 35.0 - 47.0 %   MCV 99.2 80.0 - 100.0 fL   MCH 32.7 26.0 - 34.0 pg   MCHC 33.0 32.0 - 36.0 g/dL   RDW 14.5 11.5 - 14.5 %   Platelets 149 (L) 150 - 440 K/uL   Neutrophils Relative % 66 %   Neutro Abs 5.8 1.4 - 6.5 K/uL   Lymphocytes Relative 17 %   Lymphs Abs 1.5 1.0 - 3.6 K/uL   Monocytes Relative 11 %   Monocytes Absolute 1.0 (H) 0.2 - 0.9 K/uL   Eosinophils Relative 5 %   Eosinophils Absolute 0.4 0 - 0.7 K/uL   Basophils Relative 1 %   Basophils Absolute 0.1 0 - 0.1 K/uL  Brain natriuretic peptide     Status: Abnormal   Collection Time: 11/16/16  4:04 PM  Result Value Ref Range   B  Natriuretic Peptide 242.0 (H) 0.0 - 100.0 pg/mL  Glucose, capillary     Status: Abnormal   Collection Time: 11/16/16  4:20 PM  Result Value Ref Range   Glucose-Capillary 178 (H) 65 - 99 mg/dL  Culture, blood (routine x 2)     Status: None (Preliminary result)   Collection Time: 11/16/16  6:50 PM  Result Value Ref Range   Specimen Description BLOOD LEFT FA    Special Requests      BOTTLES DRAWN AEROBIC AND ANAEROBIC AER 8ML ANA 11ML   Culture NO GROWTH < 12 HOURS    Report Status PENDING   CBC     Status: Abnormal   Collection Time: 11/16/16  7:01 PM  Result Value Ref Range   WBC 11.3 (H) 3.6 - 11.0 K/uL   RBC 3.18 (L) 3.80 - 5.20 MIL/uL   Hemoglobin 10.2 (L) 12.0 - 16.0 g/dL   HCT 31.6 (L) 35.0 - 47.0 %   MCV 99.2 80.0 - 100.0 fL   MCH 32.0 26.0 - 34.0 pg   MCHC 32.3 32.0 - 36.0 g/dL   RDW 14.6 (H) 11.5 - 14.5 %   Platelets 129 (L) 150 - 440 K/uL  Creatinine, serum     Status: Abnormal   Collection Time: 11/16/16  7:01 PM  Result Value Ref Range   Creatinine, Ser 2.31 (H) 0.44 - 1.00 mg/dL   GFR calc non Af Amer 17 (L) >60 mL/min   GFR calc Af Amer 20 (L) >60 mL/min    Comment: (NOTE) The eGFR has been calculated using the CKD EPI equation. This calculation has not been validated in all clinical situations. eGFR's persistently <60 mL/min signify possible Chronic Kidney Disease.   Lactic acid, plasma     Status: None   Collection Time: 11/16/16  7:01 PM  Result Value Ref Range   Lactic Acid, Venous 1.0 0.5 - 1.9 mmol/L  Troponin I     Status: None   Collection Time: 11/16/16  7:01 PM  Result Value Ref Range   Troponin I <0.03 <0.03 ng/mL  Culture, blood (routine x 2)     Status: None (Preliminary result)   Collection Time: 11/16/16  7:01 PM  Result Value Ref Range   Specimen Description BLOOD RIGHT FA    Special Requests      BOTTLES DRAWN AEROBIC AND ANAEROBIC AER 8ML ANA 8ML  Culture NO GROWTH < 12 HOURS    Report Status PENDING   Procalcitonin - Baseline      Status: None   Collection Time: 11/16/16  7:01 PM  Result Value Ref Range   Procalcitonin <0.10 ng/mL    Comment:        Interpretation: PCT (Procalcitonin) <= 0.5 ng/mL: Systemic infection (sepsis) is not likely. Local bacterial infection is possible. (NOTE)         ICU PCT Algorithm               Non ICU PCT Algorithm    ----------------------------     ------------------------------         PCT < 0.25 ng/mL                 PCT < 0.1 ng/mL     Stopping of antibiotics            Stopping of antibiotics       strongly encouraged.               strongly encouraged.    ----------------------------     ------------------------------       PCT level decrease by               PCT < 0.25 ng/mL       >= 80% from peak PCT       OR PCT 0.25 - 0.5 ng/mL          Stopping of antibiotics                                             encouraged.     Stopping of antibiotics           encouraged.    ----------------------------     ------------------------------       PCT level decrease by              PCT >= 0.25 ng/mL       < 80% from peak PCT        AND PCT >= 0.5 ng/mL            Continuin g antibiotics                                              encouraged.       Continuing antibiotics            encouraged.    ----------------------------     ------------------------------     PCT level increase compared          PCT > 0.5 ng/mL         with peak PCT AND          PCT >= 0.5 ng/mL             Escalation of antibiotics                                          strongly encouraged.      Escalation of antibiotics        strongly encouraged.   Troponin I     Status: None  Collection Time: 11/17/16 12:38 AM  Result Value Ref Range   Troponin I <0.03 <0.03 ng/mL  Glucose, capillary     Status: Abnormal   Collection Time: 11/17/16  2:31 AM  Result Value Ref Range   Glucose-Capillary 105 (H) 65 - 99 mg/dL   Comment 1 Notify RN   Troponin I     Status: None   Collection Time: 11/17/16   7:03 AM  Result Value Ref Range   Troponin I <0.03 <0.03 ng/mL  CBC     Status: Abnormal   Collection Time: 11/17/16  7:03 AM  Result Value Ref Range   WBC 8.7 3.6 - 11.0 K/uL   RBC 3.09 (L) 3.80 - 5.20 MIL/uL   Hemoglobin 10.0 (L) 12.0 - 16.0 g/dL   HCT 30.6 (L) 35.0 - 47.0 %   MCV 99.1 80.0 - 100.0 fL   MCH 32.4 26.0 - 34.0 pg   MCHC 32.7 32.0 - 36.0 g/dL   RDW 14.5 11.5 - 14.5 %   Platelets 142 (L) 150 - 440 K/uL  Basic metabolic panel     Status: Abnormal   Collection Time: 11/17/16  7:03 AM  Result Value Ref Range   Sodium 138 135 - 145 mmol/L   Potassium 5.5 (H) 3.5 - 5.1 mmol/L   Chloride 111 101 - 111 mmol/L   CO2 20 (L) 22 - 32 mmol/L   Glucose, Bld 111 (H) 65 - 99 mg/dL   BUN 51 (H) 6 - 20 mg/dL   Creatinine, Ser 2.18 (H) 0.44 - 1.00 mg/dL   Calcium 9.1 8.9 - 10.3 mg/dL   GFR calc non Af Amer 19 (L) >60 mL/min   GFR calc Af Amer 22 (L) >60 mL/min    Comment: (NOTE) The eGFR has been calculated using the CKD EPI equation. This calculation has not been validated in all clinical situations. eGFR's persistently <60 mL/min signify possible Chronic Kidney Disease.    Anion gap 7 5 - 15  Magnesium     Status: None   Collection Time: 11/17/16  7:03 AM  Result Value Ref Range   Magnesium 2.0 1.7 - 2.4 mg/dL  Phosphorus     Status: None   Collection Time: 11/17/16  7:03 AM  Result Value Ref Range   Phosphorus 4.3 2.5 - 4.6 mg/dL  Procalcitonin     Status: None   Collection Time: 11/17/16  7:03 AM  Result Value Ref Range   Procalcitonin 13.10 ng/mL    Comment:        Interpretation: PCT >= 10 ng/mL: Important systemic inflammatory response, almost exclusively due to severe bacterial sepsis or septic shock. (NOTE)         ICU PCT Algorithm               Non ICU PCT Algorithm    ----------------------------     ------------------------------         PCT < 0.25 ng/mL                 PCT < 0.1 ng/mL     Stopping of antibiotics            Stopping of antibiotics        strongly encouraged.               strongly encouraged.    ----------------------------     ------------------------------       PCT level decrease by  PCT < 0.25 ng/mL       >= 80% from peak PCT       OR PCT 0.25 - 0.5 ng/mL          Stopping of antibiotics                                             encouraged.     Stopping of antibiotics           encouraged.    ----------------------------     ------------------------------       PCT level decrease by              PCT >= 0.25 ng/mL       < 80% from peak PCT        AND PCT >= 0.5 ng/mL             Continuing antibiotics                                              encouraged.       Continuing antibiotics            encouraged.    ----------------------------     ------------------------------     PCT level increase compared          PCT > 0.5 ng/mL         with peak PCT AND          PCT >= 0.5 ng/mL             Escalation of antibiotics                                          strongly encouraged.      Escalation of antibiotics        strongly encouraged.   Glucose, capillary     Status: None   Collection Time: 11/17/16  7:51 AM  Result Value Ref Range   Glucose-Capillary 98 65 - 99 mg/dL  Urinalysis, Routine w reflex microscopic     Status: Abnormal   Collection Time: 11/17/16 11:22 AM  Result Value Ref Range   Color, Urine YELLOW (A) YELLOW   APPearance CLEAR (A) CLEAR   Specific Gravity, Urine 1.015 1.005 - 1.030   pH 5.0 5.0 - 8.0   Glucose, UA NEGATIVE NEGATIVE mg/dL   Hgb urine dipstick NEGATIVE NEGATIVE   Bilirubin Urine NEGATIVE NEGATIVE   Ketones, ur NEGATIVE NEGATIVE mg/dL   Protein, ur 30 (A) NEGATIVE mg/dL   Nitrite NEGATIVE NEGATIVE   Leukocytes, UA NEGATIVE NEGATIVE   RBC / HPF 0-5 0 - 5 RBC/hpf   WBC, UA 0-5 0 - 5 WBC/hpf   Bacteria, UA RARE (A) NONE SEEN   Squamous Epithelial / LPF 6-30 (A) NONE SEEN   Hyaline Casts, UA PRESENT    Non Squamous Epithelial 0-5 (A) NONE SEEN  Glucose,  capillary     Status: None   Collection Time: 11/17/16 11:31 AM  Result Value Ref Range   Glucose-Capillary 96 65 - 99 mg/dL  Basic metabolic panel     Status: Abnormal   Collection Time: 11/17/16  3:38 PM  Result Value Ref Range   Sodium 138 135 - 145 mmol/L   Potassium 5.0 3.5 - 5.1 mmol/L   Chloride 109 101 - 111 mmol/L   CO2 21 (L) 22 - 32 mmol/L   Glucose, Bld 109 (H) 65 - 99 mg/dL   BUN 47 (H) 6 - 20 mg/dL   Creatinine, Ser 2.05 (H) 0.44 - 1.00 mg/dL   Calcium 9.2 8.9 - 10.3 mg/dL   GFR calc non Af Amer 20 (L) >60 mL/min   GFR calc Af Amer 23 (L) >60 mL/min    Comment: (NOTE) The eGFR has been calculated using the CKD EPI equation. This calculation has not been validated in all clinical situations. eGFR's persistently <60 mL/min signify possible Chronic Kidney Disease.    Anion gap 8 5 - 15  Glucose, capillary     Status: Abnormal   Collection Time: 11/17/16  4:37 PM  Result Value Ref Range   Glucose-Capillary 109 (H) 65 - 99 mg/dL  Glucose, capillary     Status: Abnormal   Collection Time: 11/17/16  9:24 PM  Result Value Ref Range   Glucose-Capillary 130 (H) 65 - 99 mg/dL    Dg Chest 1 View  Result Date: 11/16/2016 CLINICAL DATA:  Acute onset of bradycardia.  Initial encounter. EXAM: CHEST 1 VIEW COMPARISON:  Chest radiograph performed 11/07/2016 FINDINGS: The lungs are well-aerated. A small right pleural effusion is noted, improved from the prior study. Mild right basilar opacity likely reflects atelectasis. There is no evidence of pneumothorax. The cardiomediastinal silhouette is mildly enlarged. No acute osseous abnormalities are seen. External pacing pads are noted. IMPRESSION: Small right pleural effusion is improved from the prior study. Mild right basilar airspace opacity likely reflects atelectasis. Mild cardiomegaly. Electronically Signed   By: Garald Balding M.D.   On: 11/16/2016 17:23   Dg Abd 1 View  Result Date: 11/16/2016 CLINICAL DATA:  Abdominal pain.  EXAM: ABDOMEN - 1 VIEW COMPARISON:  None. FINDINGS: Moderate amount of stool in the colon. There is no bowel dilatation to suggest obstruction. There is no evidence of pneumoperitoneum, portal venous gas or pneumatosis. There are no pathologic calcifications along the expected course of the ureters. The osseous structures are unremarkable. IMPRESSION: Moderate amount of stool in the colon. Electronically Signed   By: Kathreen Devoid   On: 11/16/2016 19:17    Review of Systems  Constitutional: Positive for malaise/fatigue.  HENT: Negative.   Eyes: Negative.   Respiratory: Positive for shortness of breath.   Cardiovascular: Positive for palpitations and PND.  Gastrointestinal: Positive for abdominal pain, heartburn and nausea.  Genitourinary: Negative.   Musculoskeletal: Positive for myalgias.  Skin: Negative.   Neurological: Positive for weakness.  Endo/Heme/Allergies: Negative.   Psychiatric/Behavioral: Negative.    Blood pressure (!) 141/53, pulse 81, temperature 97.8 F (36.6 C), resp. rate 16, height 5' 6"  (1.676 m), weight 71.8 kg (158 lb 3.2 oz), SpO2 97 %. Physical Exam  Nursing note and vitals reviewed. Constitutional: She is oriented to person, place, and time. She appears well-developed and well-nourished.  HENT:  Head: Normocephalic and atraumatic.  Eyes: Conjunctivae and EOM are normal. Pupils are equal, round, and reactive to light.  Neck: Normal range of motion. Neck supple.  Cardiovascular: Normal rate and regular rhythm.   Respiratory: Effort normal and breath sounds normal.  GI: Soft. Bowel sounds are normal.  Musculoskeletal: Normal range of motion.  Neurological: She is alert and oriented to person, place, and time.  Skin:  Skin is warm and dry.  Psychiatric: She has a normal mood and affect.    Assessment/Plan: Weakness Fatigue Abdominal pain Coronary artery disease GERD Congestive heart failure Acute on chronic renal  insufficiency Hypertension COPD Hyperlipidemia Peripheral vascular disease History history of CVA Bradycardia . PLAN Agree with admit to telemetry Continue*support with inhalers and supplemental oxygen Continue telemetry rehydrate for hypertension hold beta blockers and calcium blockers Gentle hydration for renal insufficiency follow-up with nephrology Recommend Protonix therapy for GERD symptoms advance diet as tolerated Recommend physical therapy and occupational therapy Supportive therapy Conservative cardiac input No clear indication for permanent pacemaker at this point   Orange Hilligoss D Ader Fritze 11/18/2016, 7:07 AM

## 2016-11-18 NOTE — Progress Notes (Signed)
Speech Language Pathology Treatment: Dysphagia  Patient Details Name: [name] [name] MRN: [id number] DOB: 1923-03-22 Today's Date: 11/18/2016 Time: [id number] SLP Time Calculation (min) (ACUTE ONLY): 40 min  Assessment / Plan / Recommendation Clinical Impression  Pt appears at reduced risk for aspiration with current diet following general aspiration precautions. Pt continues to adequately tolerate dysphagia 3 diet with thin liquids. Pt given trials of thin liquids (~4-5 oz) this date with no overt s/sx of aspiration were noted during trials and no significant oral phase deficits noted. Pt noted not feeling hungry last night or this morning, only consuming ~5 oz of Ensure this morning. Pt denied any trouble swallowing last night. Thorough education provided on aspiration precautions, with pt recalling strategies/precautions given yesterday by SLP (small sips, reducing distractions, sitting upright at 90 degrees and dry swallow after each sip). Pt noted feeling better today and using those strategies with Ensure drink and sweet tea. No overt s/sx of aspiration noted with trails this date, however pt reports getting "choked and coughing" for 10+ years as baseline. Recommend pt continue with current diet following general aspiration precautions with tray set up. No further skilled ST services indicated at this time. NSG to re-consult with any change of status while admitted.    HPI HPI: Pt 83 yo female with a PMH of Stroke, Renal disorder, PVD, Osteoporosis, Neuropathy, HTN, Hyperlipidemia, GERD, Emphysema, CAD, Anemia, Left humerus fracture s/p ORIF (08/2016) and CHF.  She presented to [facility] ER 01/29 from [facility] with c/o generalized weakness and mild shortness of breath onset of symptoms 01/29.  She was recently discharged from the hospital and treated for Pneumonia.  Pt arrived to ER via EMS and upon arrival the pt was found to be hypotensive systolic bp [id number] and EKG revealed pt was in a  junctional bradycardic rhythm with heart rate in the 20's. She does take scheduled po cardizem and carvedilol most recent dose 01/29. In the ER she received 2L NS fluid bolus and calcium gluconate with no improvement of symptoms, therefore a glucagon and neosynephrine gtt was initiated.  PCCM contacted 01/29 to admit pt to ICU for hypotension and junctional rhythm secondary to antiarrhythmic and beta blocker medication. Currently, pt with note of always "coughing and getting choked" for as long as she remembers.        SLP Plan  Discharge SLP treatment due to (comment) (pt at baseline)     Recommendations  Diet recommendations: Dysphagia 3 (mechanical soft);Thin liquid Liquids provided via: Cup (straw if requested) Medication Administration: Whole meds with puree (per pts baseline report) Supervision: Patient able to self feed;Staff to assist with self feeding;Intermittent supervision to cue for compensatory strategies Compensations: Minimize environmental distractions;Slow rate;Small sips/bites;Multiple dry swallows after each bite/sip;Follow solids with liquid Postural Changes and/or Swallow Maneuvers: Seated upright 90 degrees;Upright 30-60 min after meal                Oral Care Recommendations: Oral care BID;Staff/trained caregiver to provide oral care Follow up Recommendations:  (Assisted living- [facility]) Plan: Discharge SLP treatment due to (comment) (pt at baseline)       [place], B.S Graduate Clinician  11/18/2016, 9:20 AM   This information has been reviewed and agreed upon by this supervising clinician.  Orinda Kenner, [place], CCC-SLP

## 2016-11-18 NOTE — Discharge Summary (Signed)
[name] [name] at [place] NAME: [name]    MR#:  [id number]  DATE OF BIRTH:  [birth year]  DATE OF ADMISSION:  11/16/2016 ADMITTING PHYSICIAN: Wilhelmina Mcardle, MD  DATE OF DISCHARGE: 11/18/2016  PRIMARY CARE PHYSICIAN: MASOUD,JAVED, MD   ADMISSION DIAGNOSIS:  Bradycardia [R00.1] Abdominal pain [R10.9]  DISCHARGE DIAGNOSIS:  Active Problems:   Hypotension   SECONDARY DIAGNOSIS:   Past Medical History:  Diagnosis Date  . Anemia   . CHF (congestive heart failure) ([name])   . Coronary artery disease   . Emphysema lung ([name])   . Emphysema of lung ([name])   . GERD (gastroesophageal reflux disease)   . Hyperlipidemia   . Hypertension   . Neuropathy ([name])   . Osteoporosis   . Peripheral vascular disease ([name])   . Renal disorder   . Stroke [facility])      ADMITTING HISTORY  HISTORY OF PRESENT ILLNESS:   This is a 83 yo female with a PMH of Stroke, Renal disorder, PVD, Osteoporosis, Neuropathy, HTN, Hyperlipidemia, GERD, Emphysema, CAD, Anemia, Left humerus fracture s/p ORIF (08/2016) and CHF.  She presented to [facility] ER 01/29 from [facility] with c/o generalized weakness and mild shortness of breath onset of symptoms 01/29.  She was recently discharged from the hospital and treated for Pneumonia.  Pt arrived to ER via EMS and upon arrival the pt was found to be hypotensive systolic bp [id number] and EKG revealed pt was in a junctional bradycardic rhythm with heart rate in the 20's. She does take scheduled po cardizem and carvedilol most recent dose 01/29. In the ER she received 2L NS fluid bolus and calcium gluconate with no improvement of symptoms, therefore a glucagon and neosynephrine gtt was initiated.  PCCM contacted 01/29 to admit pt to ICU for hypotension and junctional rhythm secondary to antiarrhythmic and beta blocker medication.   HOSPITAL COURSE:   * Sinus bradycardia * HYpotension due to bradycardia * AKI over CKD3 *  Hypertension * Hyperkalemia * AOCD * COPD * Chronic diastolic chf  Admitted to ICU due to bradycardia and hypotension. Treated with Glucagon and Levophed drips. Cardizem and coreg held. BP and HR improved over 24 hrs and transferred to tele floor. Today she is improved. BP elevated and restarted on coreg after consulting Dr. Clayborn Bigness.  Discontinue cardizem at discharge.  AKI over CKD3 and hyperkalemia due to hypotension has resolved  Seen by PT and HH PT recommended.  Discharge back to ALF with [name] PT/RN.  CONSULTS OBTAINED:    DRUG ALLERGIES:   Allergies  Allergen Reactions  . Cholesterol Other (See Comments)    Patient states she is allergic to a cholesterol medication, which causes flu-like symptoms. Name is unknown.  . Morphine And Related     Heart problems   . Zoloft [Sertraline Hcl] Swelling    DISCHARGE MEDICATIONS:   Current Discharge Medication List    CONTINUE these medications which have NOT CHANGED   Details  acetaminophen (TYLENOL) 325 MG tablet Take 650 mg by mouth every 6 (six) hours as needed for mild pain or moderate pain.    ALPRAZolam (XANAX) 0.25 MG tablet Take 0.25 mg by mouth 2 (two) times daily as needed for anxiety or agitation. Refills: 1    aspirin 81 MG tablet Take 81 mg by mouth daily.    carvedilol (COREG) 12.5 MG tablet Take 1 tablet (12.5 mg total) by mouth 2 (two) times daily. Qty: 60 tablet, Refills: 11  celecoxib (CELEBREX) 100 MG capsule Take 100 mg by mouth every other day as needed. Refills: 5    Cholecalciferol 2000 units CAPS Take 4,000 Units by mouth daily.     clopidogrel (PLAVIX) 75 MG tablet Take 1 tablet by mouth daily.    cyanocobalamin 1000 MCG tablet Take 1,000 mcg by mouth daily.     dicyclomine (BENTYL) 20 MG tablet Take 1 tablet (20 mg total) by mouth 3 (three) times daily as needed for spasms. Qty: 30 tablet, Refills: 0    FLECTOR 1.3 % PTCH Place 1 patch onto the skin every 12 (twelve) hours.      gabapentin (NEURONTIN) 300 MG capsule Take 1 capsule by mouth 3 (three) times daily.     loratadine (CLARITIN) 10 MG tablet Take 10 mg by mouth daily.    nitroGLYCERIN (NITRODUR - DOSED IN MG/24 HR) 0.2 mg/hr patch Place 1 patch onto the skin daily.     ondansetron (ZOFRAN) 4 MG tablet Take 1 tablet (4 mg total) by mouth every 8 (eight) hours as needed for nausea or vomiting. Qty: 21 tablet, Refills: 0    potassium chloride SA (K-DUR,KLOR-CON) 10 MEQ tablet Take 1 tablet (10 mEq total) by mouth daily. Qty: 30 tablet, Refills: 0    Probiotic Product (ALIGN) 4 MG CAPS Take 4 mg by mouth daily.    ranitidine (ZANTAC) 150 MG capsule Take 150 mg by mouth 2 (two) times daily.    sennosides-docusate sodium (SENOKOT-S) 8.6-50 MG tablet Take 2 tablets by mouth daily.    traMADol (ULTRAM) 50 MG tablet Take 50 mg by mouth every 4 (four) hours as needed for moderate pain or severe pain.    furosemide (LASIX) 20 MG tablet Take 1 tablet (20 mg total) by mouth daily. Qty: 7 tablet, Refills: 0      STOP taking these medications     diltiazem (CARDIZEM CD) 240 MG 24 hr capsule         Today   VITAL SIGNS:  Blood pressure (!) 141/53, pulse 81, temperature 97.8 F (36.6 C), resp. rate 16, height 5\' 6"  (1.676 m), weight 71.8 kg (158 lb 3.2 oz), SpO2 97 %.  I/O:   Intake/Output Summary (Last 24 hours) at 11/18/16 1043 Last data filed at 11/18/16 0608  Gross per 24 hour  Intake                0 ml  Output              650 ml  Net             -650 ml    PHYSICAL EXAMINATION:  Physical Exam  GENERAL:  83 y.o.-year-old patient lying in the bed with no acute distress.  LUNGS: Normal breath sounds bilaterally, no wheezing, rales,rhonchi or crepitation. No use of accessory muscles of respiration.  CARDIOVASCULAR: S1, S2 normal. No murmurs, rubs, or gallops.  ABDOMEN: Soft, non-tender, non-distended. Bowel sounds present. No organomegaly or mass.  NEUROLOGIC: Moves all 4  extremities. PSYCHIATRIC: The patient is alert and awake SKIN: No obvious rash, lesion, or ulcer.   DATA REVIEW:   CBC  Recent Labs Lab 11/18/16 0650  WBC 6.6  HGB 9.0*  HCT 26.9*  PLT 121*    Chemistries   Recent Labs Lab 11/16/16 1604  11/17/16 0703  11/18/16 0650  NA 136  --  138  < > 140  K 5.1  --  5.5*  < > 4.8  CL 111  --  111  < > 110  CO2 21*  --  20*  < > 23  GLUCOSE 157*  --  111*  < > 92  BUN 43*  --  51*  < > 45*  CREATININE 2.27*  < > 2.18*  < > 1.90*  CALCIUM 8.6*  --  9.1  < > 9.2  MG  --   --  2.0  --   --   AST 20  --   --   --   --   ALT 10*  --   --   --   --   ALKPHOS 107  --   --   --   --   BILITOT 0.7  --   --   --   --   < > = values in this interval not displayed.  Cardiac Enzymes  Recent Labs Lab 11/17/16 0703  TROPONINI <0.03    Microbiology Results  Results for orders placed or performed during the hospital encounter of 11/16/16  Culture, blood (routine x 2)     Status: None (Preliminary result)   Collection Time: 11/16/16  6:50 PM  Result Value Ref Range Status   Specimen Description BLOOD LEFT FA  Final   Special Requests   Final    BOTTLES DRAWN AEROBIC AND ANAEROBIC AER 8ML ANA 11ML   Culture NO GROWTH 2 DAYS  Final   Report Status PENDING  Incomplete  Culture, blood (routine x 2)     Status: None (Preliminary result)   Collection Time: 11/16/16  7:01 PM  Result Value Ref Range Status   Specimen Description BLOOD RIGHT FA  Final   Special Requests   Final    BOTTLES DRAWN AEROBIC AND ANAEROBIC AER 8ML ANA 8ML   Culture NO GROWTH 2 DAYS  Final   Report Status PENDING  Incomplete    RADIOLOGY:  Dg Chest 1 View  Result Date: 11/16/2016 CLINICAL DATA:  Acute onset of bradycardia.  Initial encounter. EXAM: CHEST 1 VIEW COMPARISON:  Chest radiograph performed 11/07/2016 FINDINGS: The lungs are well-aerated. A small right pleural effusion is noted, improved from the prior study. Mild right basilar opacity likely reflects  atelectasis. There is no evidence of pneumothorax. The cardiomediastinal silhouette is mildly enlarged. No acute osseous abnormalities are seen. External pacing pads are noted. IMPRESSION: Small right pleural effusion is improved from the prior study. Mild right basilar airspace opacity likely reflects atelectasis. Mild cardiomegaly. Electronically Signed   By: Garald Balding M.D.   On: 11/16/2016 17:23   Dg Abd 1 View  Result Date: 11/16/2016 CLINICAL DATA:  Abdominal pain. EXAM: ABDOMEN - 1 VIEW COMPARISON:  None. FINDINGS: Moderate amount of stool in the colon. There is no bowel dilatation to suggest obstruction. There is no evidence of pneumoperitoneum, portal venous gas or pneumatosis. There are no pathologic calcifications along the expected course of the ureters. The osseous structures are unremarkable. IMPRESSION: Moderate amount of stool in the colon. Electronically Signed   By: Kathreen Devoid   On: 11/16/2016 19:17    Follow up with PCP in 1 week.  Management plans discussed with the patient, family and they are in agreement.  CODE STATUS:     Code Status Orders        Start     Ordered   11/16/16 1829  Do not attempt resuscitation (DNR)  Continuous    Question Answer Comment  In the event of cardiac or respiratory ARREST Do not call a "  code blue"   In the event of cardiac or respiratory ARREST Do not perform Intubation, CPR, defibrillation or ACLS   In the event of cardiac or respiratory ARREST Use medication by any route, position, wound care, and other measures to relive pain and suffering. May use oxygen, suction and manual treatment of airway obstruction as needed for comfort.      11/16/16 1831    Code Status History    Date Active Date Inactive Code Status Order ID Comments User Context   11/07/2016  2:54 PM 11/09/2016  9:28 PM DNR [id number]  Idelle Crouch, MD Inpatient   11/07/2016  2:13 PM 11/07/2016  2:54 PM Full Code [id number]  Idelle Crouch, MD Inpatient     Advance Directive Documentation   Flowsheet Row Most Recent Value  Type of Advance Directive  Healthcare Power of [place], Out of facility DNR (pink MOST or yellow form)  Pre-existing out of facility DNR order (yellow form or pink MOST form)  No data  "MOST" Form in Place?  No data      TOTAL TIME TAKING CARE OF THIS PATIENT ON DAY OF DISCHARGE: more than 30 minutes.   Hillary Bow R M.D on 11/18/2016 at 10:43 AM  Between 7am to 6pm - Pager - [phone]  After 6pm go to [email or web address] - password [email or web address] [facility]  Office  [phone]  CC: Primary care physician; Cletis [place], MD  Note: This dictation was prepared with Dragon dictation along with smaller phrase technology. Any transcriptional errors that result from this process are unintentional.

## 2016-11-18 NOTE — NC FL2 (Signed)
[place] LEVEL OF CARE SCREENING TOOL     IDENTIFICATION  Patient Name: [name] [name] Birthdate: Nov 15, 1922 Sex: female Admission Date (Current Location): 11/16/2016  [place] and [place] Number:  Engineering geologist and Address:  [facility], [address], [place], [place] [address]      Provider Number: [id number]  Attending Physician Name and Address:  Hillary Bow, MD  Relative Name and Phone Number:       Current Level of Care: Hospital Recommended Level of Care: [place] Prior Approval Number:    Date Approved/Denied:   PASRR Number:    Discharge Plan:  (ALF)    Current Diagnoses: Patient Active Problem List   Diagnosis Date Noted  . Hypotension 11/16/2016  . Atrial fibrillation, rapid ([place]) 11/07/2016  . CAP (community acquired pneumonia) 11/07/2016  . Acute CHF (congestive heart failure) ([place]) 11/07/2016  . Accelerated hypertension 11/07/2016  . Generalized anxiety disorder 09/26/2016  . PAD (peripheral artery disease) ([place]) 10/05/2013  . Chronic venous insufficiency 10/05/2013    Orientation RESPIRATION BLADDER Height & Weight     Self, Place  Normal  Oxygen: 1 liters  Continent Weight: 158 lb 3.2 oz (71.8 kg) Height:  5\' 6"  (167.6 cm)  BEHAVIORAL SYMPTOMS/MOOD NEUROLOGICAL BOWEL NUTRITION STATUS   (none)  (none) Continent Diet (dysphagia 3 thin liquid)  AMBULATORY STATUS COMMUNICATION OF NEEDS Skin   Limited Assist Verbally Normal                       Personal Care Assistance Level of Assistance  Dressing, Feeding, Bathing Bathing Assistance: Limited assistance Feeding assistance: Limited assistance Dressing Assistance: Limited assistance     Functional Limitations Info  Hearing   Hearing Info: Impaired      SPECIAL CARE FACTORS FREQUENCY   (resumption of home health)                    Contractures Contractures Info: Not present    Additional Factors Info   Code Status, Allergies Code Status Info: dnr             DISCHARGE MEDICATIONS:       Current Discharge Medication List        CONTINUE these medications which have NOT CHANGED   Details  acetaminophen (TYLENOL) 325 MG tablet Take 650 mg by mouth every 6 (six) hours as needed for mild pain or moderate pain.    ALPRAZolam (XANAX) 0.25 MG tablet Take 0.25 mg by mouth 2 (two) times daily as needed for anxiety or agitation. Refills: 1    aspirin 81 MG tablet Take 81 mg by mouth daily.    carvedilol (COREG) 12.5 MG tablet Take 1 tablet (12.5 mg total) by mouth 2 (two) times daily. Qty: 60 tablet, Refills: 11    celecoxib (CELEBREX) 100 MG capsule Take 100 mg by mouth every other day as needed. Refills: 5    Cholecalciferol 2000 units CAPS Take 4,000 Units by mouth daily.     clopidogrel (PLAVIX) 75 MG tablet Take 1 tablet by mouth daily.    cyanocobalamin 1000 MCG tablet Take 1,000 mcg by mouth daily.     dicyclomine (BENTYL) 20 MG tablet Take 1 tablet (20 mg total) by mouth 3 (three) times daily as needed for spasms. Qty: 30 tablet, Refills: 0    FLECTOR 1.3 % PTCH Place 1 patch onto the skin every 12 (twelve) hours.     gabapentin (NEURONTIN)  300 MG capsule Take 1 capsule by mouth 3 (three) times daily.     loratadine (CLARITIN) 10 MG tablet Take 10 mg by mouth daily.    nitroGLYCERIN (NITRODUR - DOSED IN MG/24 HR) 0.2 mg/hr patch Place 1 patch onto the skin daily.     ondansetron (ZOFRAN) 4 MG tablet Take 1 tablet (4 mg total) by mouth every 8 (eight) hours as needed for nausea or vomiting. Qty: 21 tablet, Refills: 0    potassium chloride SA (K-DUR,KLOR-CON) 10 MEQ tablet Take 1 tablet (10 mEq total) by mouth daily. Qty: 30 tablet, Refills: 0    Probiotic Product (ALIGN) 4 MG CAPS Take 4 mg by mouth daily.    ranitidine (ZANTAC) 150 MG capsule Take 150 mg by mouth 2 (two) times daily.    sennosides-docusate sodium (SENOKOT-S) 8.6-50 MG tablet  Take 2 tablets by mouth daily.    traMADol (ULTRAM) 50 MG tablet Take 50 mg by mouth every 4 (four) hours as needed for moderate pain or severe pain.    furosemide (LASIX) 20 MG tablet Take 1 tablet (20 mg total) by mouth daily. Qty: 7 tablet, Refills: 0         STOP taking these medications     diltiazem (CARDIZEM CD) 240 MG 24 hr capsule         Relevant Lab Results:   Additional Information    Shela Leff, LCSW

## 2016-11-18 NOTE — Care Management (Addendum)
Patient for discharge back to [facility] today.  Facility aware of discharge and of need for new home 02.  Street address of facility is [place] phone [phone].  Contacted [facility].  Notified [facility] of discharge.

## 2016-11-18 NOTE — Clinical Social Work Note (Signed)
RN CM arranged oxygen through [facility]. CSW received a call from [place] at [facility] who stated that they were told by [facility] that the concentrator for the oxygen would be delivered between 5-8 tonight. CSW informed Thayer Headings that there is a portable in patient's room that will last her 4 hours. [facility] Headings stated that they need to come on and pick patient up as they will no longer have transport after 5pm. RN CM has arranged for [facility] to deliver a second portable to assist with sustaining patient until the concentrator is delivered later this evening. Shela Leff MSW,LCSW [phone]

## 2016-11-18 NOTE — Progress Notes (Signed)
SATURATION QUALIFICATIONS: (This note is used to comply with regulatory documentation for home oxygen)  Patient Saturations on Room Air at Rest = 87%  Patient Saturations on Room Air while Ambulating = n/a%  Patient Saturations on 1 Liters of oxygen while resting = 94%  Please briefly explain why patient needs home oxygen: 

## 2016-11-18 NOTE — Consult Note (Signed)
[place] Nurse wound consult note Reason for Consult:Bilateral Unnas boots and partial thickness wound to right third toe. All present on admission.  Daughter removed Unnas boots last night as they were overdue to be changed, per daughter.  Reports of a healed skin tear beneath dressing noted on admission to ED as well.   MD wanted to see legs prior to ordering wound care consult to replace unna boots.   Wound type:Chronic compression.  Wounds have resolved to lower extremities Partial thickness to right third toe.  Was present on last admission.  Healing.  Pressure Injury POA: Yes right toe Measurement:right toe:  0.5 cm x 0.1 cm lesion to dorsal aspect right third toe Bilateral lower legs are intact.  Chronic dry skin noted  Skin is cleansed with soap and water.   Wound [id number] and moist Drainage (amount, consistency, odor) none Periwound: Dry skin Dressing procedure/placement/frequency:Zinc layer from below toes to below knee.  Secure with self adherent COban wrap.  Change weekly  Cleanse wound to right toe with NS.  Apply vaseline gauze to wound bed.  Cover with 2x2 and tape.  Change Mon/Wed/Fri until healed.  Will not follow at this time.  Please re-consult if needed. Discharging today.  Domenic Moras RN BSN [place] Pager [phone]

## 2016-11-18 NOTE — Clinical Social Work Note (Signed)
Clinical Social Work Assessment  Patient Details  Name: [name] [name] MRN: [id number] Date of Birth: Jul 02, 1923  Date of referral:  11/18/16               Reason for consult:  Facility Placement                Permission sought to share information with:  Facility Sport and exercise psychologist, Family Supports Permission granted to share information::  Yes, Verbal Permission Granted  Name::        Agency::     Relationship::     Contact Information:     Housing/Transportation Living arrangements for the past 2 months:  [place] of Information:  Patient, Adult Children Patient Interpreter Needed:  None Criminal Activity/Legal Involvement Pertinent to Current Situation/Hospitalization:  No - Comment as needed Significant Relationships:  Adult Children Lives with:  Facility Resident Do you feel safe going back to the place where you live?  Yes Need for family participation in patient care:  Yes (Comment)  Care giving concerns:  Patient is a resident of [facility].   Social Worker assessment / plan:  CSW informed that patient is to discharge today to return to [facility].CSW spoke with patient who requested I call her daughter to make arrangements.   CSW contacted Thayer Headings at [facility] and she stated that they can transport patient if the daughter is not able. Thayer Headings stated that they were able to accept patient back with oxygen as well. CSW had left message for patient's daughter who returned call and was made aware of the discharge. Patient's daughter prefers to have [place] transport. CSW answered questions that the daughter had. CSW contacted Thayer Headings back and informed her of need for transport. She stated that she would coordinate it with when the oxygen was delivered to their facility.   Employment status:  Retired Forensic scientist:  Medicare PT Recommendations:    Information / Referral to community resources:     Patient/Family's Response to  care:  Patient and daughter expressed appreciation for CSW assistance.  Patient/Family's Understanding of and Emotional Response to Diagnosis, Current Treatment, and Prognosis:  Patient's daughter asked appropriate questions and verbalized understanding of discharge today.   Emotional Assessment Appearance:  Appears stated age Attitude/Demeanor/Rapport:   (pleasant and cooperative) Affect (typically observed):  Accepting, Pleasant, Calm Orientation:  Oriented to Self, Oriented to Place Alcohol / Substance use:  Not Applicable Psych involvement (Current and /or in the community):  No (Comment)  Discharge Needs  Concerns to be addressed:  Care Coordination Readmission within the last 30 days:  No Current discharge risk:  None Barriers to Discharge:  No Barriers Identified   Shela Leff, LCSW 11/18/2016, 2:06 PM

## 2016-11-18 NOTE — Progress Notes (Signed)
PHARMACIST - PHYSICIAN COMMUNICATION  DR:   Darvin Neighbours  CONCERNING: IV to Oral Route Change Policy  RECOMMENDATION: This patient is receiving pantoprazole by the intravenous route.  Based on criteria approved by the Pharmacy and Therapeutics Committee, the intravenous medication(s) is/are being converted to the equivalent oral dose form(s).   DESCRIPTION: These criteria include:  The patient is eating (either orally or via tube) and/or has been taking other orally administered medications for a least 24 hours  The patient has no evidence of active gastrointestinal bleeding or impaired GI absorption (gastrectomy, short bowel, patient on TNA or NPO).  If you have questions about this conversion, please contact the Pharmacy Department  []   [phone] )  [name] [name] [x]   [phone] )  [facility] []   [phone] )  [facility] []   [phone] )  [facility] []   [phone] )  [place], PharmD Pharmacy Resident 11/18/2016 10:05 AM

## 2016-11-18 NOTE — Care Management Important Message (Signed)
Important Message  Patient Details  Name: CORTISHA TRAGER MRN: [id number] Date of Birth: 08-Jul-1923   Medicare Important Message Given:  Yes  Initial signed IM printed from Epic and given to patient.     Katrina Stack, RN 11/18/2016, 12:21 PM

## 2016-11-18 NOTE — Progress Notes (Signed)
Initial HF Clinic appointment scheduled on November 26, 2016 at 11:00am. Thank you

## 2016-11-19 DIAGNOSIS — I87333 Chronic venous hypertension (idiopathic) with ulcer and inflammation of bilateral lower extremity: Secondary | ICD-10-CM | POA: Diagnosis not present

## 2016-11-19 DIAGNOSIS — M80022D Age-related osteoporosis with current pathological fracture, left humerus, subsequent encounter for fracture with routine healing: Secondary | ICD-10-CM | POA: Diagnosis not present

## 2016-11-19 DIAGNOSIS — L97822 Non-pressure chronic ulcer of other part of left lower leg with fat layer exposed: Secondary | ICD-10-CM | POA: Diagnosis not present

## 2016-11-19 DIAGNOSIS — I509 Heart failure, unspecified: Secondary | ICD-10-CM | POA: Diagnosis not present

## 2016-11-19 DIAGNOSIS — L97812 Non-pressure chronic ulcer of other part of right lower leg with fat layer exposed: Secondary | ICD-10-CM | POA: Diagnosis not present

## 2016-11-19 DIAGNOSIS — I130 Hypertensive heart and chronic kidney disease with heart failure and stage 1 through stage 4 chronic kidney disease, or unspecified chronic kidney disease: Secondary | ICD-10-CM | POA: Diagnosis not present

## 2016-11-20 DIAGNOSIS — I87333 Chronic venous hypertension (idiopathic) with ulcer and inflammation of bilateral lower extremity: Secondary | ICD-10-CM | POA: Diagnosis not present

## 2016-11-20 DIAGNOSIS — L97812 Non-pressure chronic ulcer of other part of right lower leg with fat layer exposed: Secondary | ICD-10-CM | POA: Diagnosis not present

## 2016-11-20 DIAGNOSIS — I509 Heart failure, unspecified: Secondary | ICD-10-CM | POA: Diagnosis not present

## 2016-11-20 DIAGNOSIS — L97822 Non-pressure chronic ulcer of other part of left lower leg with fat layer exposed: Secondary | ICD-10-CM | POA: Diagnosis not present

## 2016-11-20 DIAGNOSIS — M80022D Age-related osteoporosis with current pathological fracture, left humerus, subsequent encounter for fracture with routine healing: Secondary | ICD-10-CM | POA: Diagnosis not present

## 2016-11-20 DIAGNOSIS — I130 Hypertensive heart and chronic kidney disease with heart failure and stage 1 through stage 4 chronic kidney disease, or unspecified chronic kidney disease: Secondary | ICD-10-CM | POA: Diagnosis not present

## 2016-11-21 LAB — CULTURE, BLOOD (ROUTINE X 2)
Culture: NO GROWTH
Culture: NO GROWTH

## 2016-11-23 DIAGNOSIS — M80022D Age-related osteoporosis with current pathological fracture, left humerus, subsequent encounter for fracture with routine healing: Secondary | ICD-10-CM | POA: Diagnosis not present

## 2016-11-23 DIAGNOSIS — L97822 Non-pressure chronic ulcer of other part of left lower leg with fat layer exposed: Secondary | ICD-10-CM | POA: Diagnosis not present

## 2016-11-23 DIAGNOSIS — I130 Hypertensive heart and chronic kidney disease with heart failure and stage 1 through stage 4 chronic kidney disease, or unspecified chronic kidney disease: Secondary | ICD-10-CM | POA: Diagnosis not present

## 2016-11-23 DIAGNOSIS — I87333 Chronic venous hypertension (idiopathic) with ulcer and inflammation of bilateral lower extremity: Secondary | ICD-10-CM | POA: Diagnosis not present

## 2016-11-23 DIAGNOSIS — L97812 Non-pressure chronic ulcer of other part of right lower leg with fat layer exposed: Secondary | ICD-10-CM | POA: Diagnosis not present

## 2016-11-23 DIAGNOSIS — I509 Heart failure, unspecified: Secondary | ICD-10-CM | POA: Diagnosis not present

## 2016-11-24 DIAGNOSIS — R0902 Hypoxemia: Secondary | ICD-10-CM | POA: Diagnosis not present

## 2016-11-24 DIAGNOSIS — I480 Paroxysmal atrial fibrillation: Secondary | ICD-10-CM | POA: Diagnosis not present

## 2016-11-24 DIAGNOSIS — K219 Gastro-esophageal reflux disease without esophagitis: Secondary | ICD-10-CM | POA: Diagnosis not present

## 2016-11-24 DIAGNOSIS — I87333 Chronic venous hypertension (idiopathic) with ulcer and inflammation of bilateral lower extremity: Secondary | ICD-10-CM | POA: Diagnosis not present

## 2016-11-24 DIAGNOSIS — L97822 Non-pressure chronic ulcer of other part of left lower leg with fat layer exposed: Secondary | ICD-10-CM | POA: Diagnosis not present

## 2016-11-24 DIAGNOSIS — I2510 Atherosclerotic heart disease of native coronary artery without angina pectoris: Secondary | ICD-10-CM | POA: Diagnosis not present

## 2016-11-24 DIAGNOSIS — J449 Chronic obstructive pulmonary disease, unspecified: Secondary | ICD-10-CM | POA: Diagnosis not present

## 2016-11-24 DIAGNOSIS — L97812 Non-pressure chronic ulcer of other part of right lower leg with fat layer exposed: Secondary | ICD-10-CM | POA: Diagnosis not present

## 2016-11-24 DIAGNOSIS — I130 Hypertensive heart and chronic kidney disease with heart failure and stage 1 through stage 4 chronic kidney disease, or unspecified chronic kidney disease: Secondary | ICD-10-CM | POA: Diagnosis not present

## 2016-11-24 DIAGNOSIS — I10 Essential (primary) hypertension: Secondary | ICD-10-CM | POA: Diagnosis not present

## 2016-11-24 DIAGNOSIS — I5032 Chronic diastolic (congestive) heart failure: Secondary | ICD-10-CM | POA: Diagnosis not present

## 2016-11-24 DIAGNOSIS — M80022D Age-related osteoporosis with current pathological fracture, left humerus, subsequent encounter for fracture with routine healing: Secondary | ICD-10-CM | POA: Diagnosis not present

## 2016-11-24 DIAGNOSIS — I509 Heart failure, unspecified: Secondary | ICD-10-CM | POA: Diagnosis not present

## 2016-11-24 DIAGNOSIS — D649 Anemia, unspecified: Secondary | ICD-10-CM | POA: Diagnosis not present

## 2016-11-25 DIAGNOSIS — L97822 Non-pressure chronic ulcer of other part of left lower leg with fat layer exposed: Secondary | ICD-10-CM | POA: Diagnosis not present

## 2016-11-25 DIAGNOSIS — I130 Hypertensive heart and chronic kidney disease with heart failure and stage 1 through stage 4 chronic kidney disease, or unspecified chronic kidney disease: Secondary | ICD-10-CM | POA: Diagnosis not present

## 2016-11-25 DIAGNOSIS — L97812 Non-pressure chronic ulcer of other part of right lower leg with fat layer exposed: Secondary | ICD-10-CM | POA: Diagnosis not present

## 2016-11-25 DIAGNOSIS — I509 Heart failure, unspecified: Secondary | ICD-10-CM | POA: Diagnosis not present

## 2016-11-25 DIAGNOSIS — I87333 Chronic venous hypertension (idiopathic) with ulcer and inflammation of bilateral lower extremity: Secondary | ICD-10-CM | POA: Diagnosis not present

## 2016-11-25 DIAGNOSIS — M80022D Age-related osteoporosis with current pathological fracture, left humerus, subsequent encounter for fracture with routine healing: Secondary | ICD-10-CM | POA: Diagnosis not present

## 2016-11-26 ENCOUNTER — Ambulatory Visit: Payer: Medicare Other | Admitting: Family

## 2016-11-26 DIAGNOSIS — I509 Heart failure, unspecified: Secondary | ICD-10-CM | POA: Diagnosis not present

## 2016-11-26 DIAGNOSIS — L97822 Non-pressure chronic ulcer of other part of left lower leg with fat layer exposed: Secondary | ICD-10-CM | POA: Diagnosis not present

## 2016-11-26 DIAGNOSIS — I87333 Chronic venous hypertension (idiopathic) with ulcer and inflammation of bilateral lower extremity: Secondary | ICD-10-CM | POA: Diagnosis not present

## 2016-11-26 DIAGNOSIS — M80022D Age-related osteoporosis with current pathological fracture, left humerus, subsequent encounter for fracture with routine healing: Secondary | ICD-10-CM | POA: Diagnosis not present

## 2016-11-26 DIAGNOSIS — L97812 Non-pressure chronic ulcer of other part of right lower leg with fat layer exposed: Secondary | ICD-10-CM | POA: Diagnosis not present

## 2016-11-26 DIAGNOSIS — I130 Hypertensive heart and chronic kidney disease with heart failure and stage 1 through stage 4 chronic kidney disease, or unspecified chronic kidney disease: Secondary | ICD-10-CM | POA: Diagnosis not present

## 2016-11-27 DIAGNOSIS — L97812 Non-pressure chronic ulcer of other part of right lower leg with fat layer exposed: Secondary | ICD-10-CM | POA: Diagnosis not present

## 2016-11-27 DIAGNOSIS — I130 Hypertensive heart and chronic kidney disease with heart failure and stage 1 through stage 4 chronic kidney disease, or unspecified chronic kidney disease: Secondary | ICD-10-CM | POA: Diagnosis not present

## 2016-11-27 DIAGNOSIS — K5900 Constipation, unspecified: Secondary | ICD-10-CM | POA: Diagnosis not present

## 2016-11-27 DIAGNOSIS — I509 Heart failure, unspecified: Secondary | ICD-10-CM | POA: Diagnosis not present

## 2016-11-27 DIAGNOSIS — I87333 Chronic venous hypertension (idiopathic) with ulcer and inflammation of bilateral lower extremity: Secondary | ICD-10-CM | POA: Diagnosis not present

## 2016-11-27 DIAGNOSIS — L97822 Non-pressure chronic ulcer of other part of left lower leg with fat layer exposed: Secondary | ICD-10-CM | POA: Diagnosis not present

## 2016-11-27 DIAGNOSIS — M80022D Age-related osteoporosis with current pathological fracture, left humerus, subsequent encounter for fracture with routine healing: Secondary | ICD-10-CM | POA: Diagnosis not present

## 2016-11-27 DIAGNOSIS — R05 Cough: Secondary | ICD-10-CM | POA: Diagnosis not present

## 2016-11-30 DIAGNOSIS — I509 Heart failure, unspecified: Secondary | ICD-10-CM | POA: Diagnosis not present

## 2016-11-30 DIAGNOSIS — M80022D Age-related osteoporosis with current pathological fracture, left humerus, subsequent encounter for fracture with routine healing: Secondary | ICD-10-CM | POA: Diagnosis not present

## 2016-11-30 DIAGNOSIS — I87333 Chronic venous hypertension (idiopathic) with ulcer and inflammation of bilateral lower extremity: Secondary | ICD-10-CM | POA: Diagnosis not present

## 2016-11-30 DIAGNOSIS — I130 Hypertensive heart and chronic kidney disease with heart failure and stage 1 through stage 4 chronic kidney disease, or unspecified chronic kidney disease: Secondary | ICD-10-CM | POA: Diagnosis not present

## 2016-11-30 DIAGNOSIS — L97812 Non-pressure chronic ulcer of other part of right lower leg with fat layer exposed: Secondary | ICD-10-CM | POA: Diagnosis not present

## 2016-11-30 DIAGNOSIS — L97822 Non-pressure chronic ulcer of other part of left lower leg with fat layer exposed: Secondary | ICD-10-CM | POA: Diagnosis not present

## 2016-12-01 ENCOUNTER — Ambulatory Visit: Payer: Medicare Other | Admitting: Family

## 2016-12-01 DIAGNOSIS — L97822 Non-pressure chronic ulcer of other part of left lower leg with fat layer exposed: Secondary | ICD-10-CM | POA: Diagnosis not present

## 2016-12-01 DIAGNOSIS — I130 Hypertensive heart and chronic kidney disease with heart failure and stage 1 through stage 4 chronic kidney disease, or unspecified chronic kidney disease: Secondary | ICD-10-CM | POA: Diagnosis not present

## 2016-12-01 DIAGNOSIS — I509 Heart failure, unspecified: Secondary | ICD-10-CM | POA: Diagnosis not present

## 2016-12-01 DIAGNOSIS — L97812 Non-pressure chronic ulcer of other part of right lower leg with fat layer exposed: Secondary | ICD-10-CM | POA: Diagnosis not present

## 2016-12-01 DIAGNOSIS — M80022D Age-related osteoporosis with current pathological fracture, left humerus, subsequent encounter for fracture with routine healing: Secondary | ICD-10-CM | POA: Diagnosis not present

## 2016-12-01 DIAGNOSIS — I87333 Chronic venous hypertension (idiopathic) with ulcer and inflammation of bilateral lower extremity: Secondary | ICD-10-CM | POA: Diagnosis not present

## 2016-12-03 DIAGNOSIS — J449 Chronic obstructive pulmonary disease, unspecified: Secondary | ICD-10-CM | POA: Diagnosis not present

## 2016-12-03 DIAGNOSIS — I87333 Chronic venous hypertension (idiopathic) with ulcer and inflammation of bilateral lower extremity: Secondary | ICD-10-CM | POA: Diagnosis not present

## 2016-12-03 DIAGNOSIS — L97822 Non-pressure chronic ulcer of other part of left lower leg with fat layer exposed: Secondary | ICD-10-CM | POA: Diagnosis not present

## 2016-12-03 DIAGNOSIS — E785 Hyperlipidemia, unspecified: Secondary | ICD-10-CM | POA: Diagnosis not present

## 2016-12-03 DIAGNOSIS — K219 Gastro-esophageal reflux disease without esophagitis: Secondary | ICD-10-CM | POA: Diagnosis not present

## 2016-12-03 DIAGNOSIS — I130 Hypertensive heart and chronic kidney disease with heart failure and stage 1 through stage 4 chronic kidney disease, or unspecified chronic kidney disease: Secondary | ICD-10-CM | POA: Diagnosis not present

## 2016-12-03 DIAGNOSIS — M80022D Age-related osteoporosis with current pathological fracture, left humerus, subsequent encounter for fracture with routine healing: Secondary | ICD-10-CM | POA: Diagnosis not present

## 2016-12-03 DIAGNOSIS — L97812 Non-pressure chronic ulcer of other part of right lower leg with fat layer exposed: Secondary | ICD-10-CM | POA: Diagnosis not present

## 2016-12-03 DIAGNOSIS — I509 Heart failure, unspecified: Secondary | ICD-10-CM | POA: Diagnosis not present

## 2016-12-04 DIAGNOSIS — I509 Heart failure, unspecified: Secondary | ICD-10-CM | POA: Diagnosis not present

## 2016-12-04 DIAGNOSIS — M80022D Age-related osteoporosis with current pathological fracture, left humerus, subsequent encounter for fracture with routine healing: Secondary | ICD-10-CM | POA: Diagnosis not present

## 2016-12-04 DIAGNOSIS — I87333 Chronic venous hypertension (idiopathic) with ulcer and inflammation of bilateral lower extremity: Secondary | ICD-10-CM | POA: Diagnosis not present

## 2016-12-04 DIAGNOSIS — L97822 Non-pressure chronic ulcer of other part of left lower leg with fat layer exposed: Secondary | ICD-10-CM | POA: Diagnosis not present

## 2016-12-04 DIAGNOSIS — I130 Hypertensive heart and chronic kidney disease with heart failure and stage 1 through stage 4 chronic kidney disease, or unspecified chronic kidney disease: Secondary | ICD-10-CM | POA: Diagnosis not present

## 2016-12-04 DIAGNOSIS — L97812 Non-pressure chronic ulcer of other part of right lower leg with fat layer exposed: Secondary | ICD-10-CM | POA: Diagnosis not present

## 2016-12-07 DIAGNOSIS — L97812 Non-pressure chronic ulcer of other part of right lower leg with fat layer exposed: Secondary | ICD-10-CM | POA: Diagnosis not present

## 2016-12-07 DIAGNOSIS — I130 Hypertensive heart and chronic kidney disease with heart failure and stage 1 through stage 4 chronic kidney disease, or unspecified chronic kidney disease: Secondary | ICD-10-CM | POA: Diagnosis not present

## 2016-12-07 DIAGNOSIS — I509 Heart failure, unspecified: Secondary | ICD-10-CM | POA: Diagnosis not present

## 2016-12-07 DIAGNOSIS — M80022D Age-related osteoporosis with current pathological fracture, left humerus, subsequent encounter for fracture with routine healing: Secondary | ICD-10-CM | POA: Diagnosis not present

## 2016-12-07 DIAGNOSIS — I87333 Chronic venous hypertension (idiopathic) with ulcer and inflammation of bilateral lower extremity: Secondary | ICD-10-CM | POA: Diagnosis not present

## 2016-12-07 DIAGNOSIS — L97822 Non-pressure chronic ulcer of other part of left lower leg with fat layer exposed: Secondary | ICD-10-CM | POA: Diagnosis not present

## 2016-12-08 DIAGNOSIS — M80022D Age-related osteoporosis with current pathological fracture, left humerus, subsequent encounter for fracture with routine healing: Secondary | ICD-10-CM | POA: Diagnosis not present

## 2016-12-08 DIAGNOSIS — I509 Heart failure, unspecified: Secondary | ICD-10-CM | POA: Diagnosis not present

## 2016-12-08 DIAGNOSIS — I87333 Chronic venous hypertension (idiopathic) with ulcer and inflammation of bilateral lower extremity: Secondary | ICD-10-CM | POA: Diagnosis not present

## 2016-12-08 DIAGNOSIS — L97822 Non-pressure chronic ulcer of other part of left lower leg with fat layer exposed: Secondary | ICD-10-CM | POA: Diagnosis not present

## 2016-12-08 DIAGNOSIS — I130 Hypertensive heart and chronic kidney disease with heart failure and stage 1 through stage 4 chronic kidney disease, or unspecified chronic kidney disease: Secondary | ICD-10-CM | POA: Diagnosis not present

## 2016-12-08 DIAGNOSIS — L97812 Non-pressure chronic ulcer of other part of right lower leg with fat layer exposed: Secondary | ICD-10-CM | POA: Diagnosis not present

## 2016-12-09 DIAGNOSIS — M80022D Age-related osteoporosis with current pathological fracture, left humerus, subsequent encounter for fracture with routine healing: Secondary | ICD-10-CM | POA: Diagnosis not present

## 2016-12-09 DIAGNOSIS — L97812 Non-pressure chronic ulcer of other part of right lower leg with fat layer exposed: Secondary | ICD-10-CM | POA: Diagnosis not present

## 2016-12-09 DIAGNOSIS — I87333 Chronic venous hypertension (idiopathic) with ulcer and inflammation of bilateral lower extremity: Secondary | ICD-10-CM | POA: Diagnosis not present

## 2016-12-09 DIAGNOSIS — L97822 Non-pressure chronic ulcer of other part of left lower leg with fat layer exposed: Secondary | ICD-10-CM | POA: Diagnosis not present

## 2016-12-09 DIAGNOSIS — I130 Hypertensive heart and chronic kidney disease with heart failure and stage 1 through stage 4 chronic kidney disease, or unspecified chronic kidney disease: Secondary | ICD-10-CM | POA: Diagnosis not present

## 2016-12-09 DIAGNOSIS — I509 Heart failure, unspecified: Secondary | ICD-10-CM | POA: Diagnosis not present

## 2016-12-11 ENCOUNTER — Encounter: Payer: Self-pay | Admitting: Family

## 2016-12-11 ENCOUNTER — Ambulatory Visit: Payer: Medicare Other | Attending: Family | Admitting: Family

## 2016-12-11 VITALS — BP 133/52 | HR 70 | Resp 18 | Ht 66.0 in | Wt 145.4 lb

## 2016-12-11 DIAGNOSIS — Z9849 Cataract extraction status, unspecified eye: Secondary | ICD-10-CM | POA: Diagnosis not present

## 2016-12-11 DIAGNOSIS — Z888 Allergy status to other drugs, medicaments and biological substances status: Secondary | ICD-10-CM | POA: Insufficient documentation

## 2016-12-11 DIAGNOSIS — N289 Disorder of kidney and ureter, unspecified: Secondary | ICD-10-CM | POA: Diagnosis not present

## 2016-12-11 DIAGNOSIS — I2510 Atherosclerotic heart disease of native coronary artery without angina pectoris: Secondary | ICD-10-CM | POA: Diagnosis not present

## 2016-12-11 DIAGNOSIS — Z9660 Presence of unspecified orthopedic joint implant: Secondary | ICD-10-CM | POA: Insufficient documentation

## 2016-12-11 DIAGNOSIS — Z90710 Acquired absence of both cervix and uterus: Secondary | ICD-10-CM | POA: Diagnosis not present

## 2016-12-11 DIAGNOSIS — J439 Emphysema, unspecified: Secondary | ICD-10-CM | POA: Diagnosis not present

## 2016-12-11 DIAGNOSIS — Z8673 Personal history of transient ischemic attack (TIA), and cerebral infarction without residual deficits: Secondary | ICD-10-CM | POA: Insufficient documentation

## 2016-12-11 DIAGNOSIS — S42352D Displaced comminuted fracture of shaft of humerus, left arm, subsequent encounter for fracture with routine healing: Secondary | ICD-10-CM | POA: Diagnosis not present

## 2016-12-11 DIAGNOSIS — I5032 Chronic diastolic (congestive) heart failure: Secondary | ICD-10-CM

## 2016-12-11 DIAGNOSIS — I10 Essential (primary) hypertension: Secondary | ICD-10-CM

## 2016-12-11 DIAGNOSIS — Z7902 Long term (current) use of antithrombotics/antiplatelets: Secondary | ICD-10-CM | POA: Insufficient documentation

## 2016-12-11 DIAGNOSIS — I87333 Chronic venous hypertension (idiopathic) with ulcer and inflammation of bilateral lower extremity: Secondary | ICD-10-CM | POA: Diagnosis not present

## 2016-12-11 DIAGNOSIS — I739 Peripheral vascular disease, unspecified: Secondary | ICD-10-CM | POA: Insufficient documentation

## 2016-12-11 DIAGNOSIS — I4891 Unspecified atrial fibrillation: Secondary | ICD-10-CM | POA: Diagnosis not present

## 2016-12-11 DIAGNOSIS — M810 Age-related osteoporosis without current pathological fracture: Secondary | ICD-10-CM | POA: Insufficient documentation

## 2016-12-11 DIAGNOSIS — Z800 Family history of malignant neoplasm of digestive organs: Secondary | ICD-10-CM | POA: Diagnosis not present

## 2016-12-11 DIAGNOSIS — S42352K Displaced comminuted fracture of shaft of humerus, left arm, subsequent encounter for fracture with nonunion: Secondary | ICD-10-CM | POA: Diagnosis not present

## 2016-12-11 DIAGNOSIS — L97812 Non-pressure chronic ulcer of other part of right lower leg with fat layer exposed: Secondary | ICD-10-CM | POA: Diagnosis not present

## 2016-12-11 DIAGNOSIS — E785 Hyperlipidemia, unspecified: Secondary | ICD-10-CM | POA: Insufficient documentation

## 2016-12-11 DIAGNOSIS — R42 Dizziness and giddiness: Secondary | ICD-10-CM | POA: Diagnosis not present

## 2016-12-11 DIAGNOSIS — I110 Hypertensive heart disease with heart failure: Secondary | ICD-10-CM | POA: Diagnosis not present

## 2016-12-11 DIAGNOSIS — Z885 Allergy status to narcotic agent status: Secondary | ICD-10-CM | POA: Diagnosis not present

## 2016-12-11 DIAGNOSIS — K219 Gastro-esophageal reflux disease without esophagitis: Secondary | ICD-10-CM | POA: Insufficient documentation

## 2016-12-11 DIAGNOSIS — I130 Hypertensive heart and chronic kidney disease with heart failure and stage 1 through stage 4 chronic kidney disease, or unspecified chronic kidney disease: Secondary | ICD-10-CM | POA: Diagnosis not present

## 2016-12-11 DIAGNOSIS — M80022D Age-related osteoporosis with current pathological fracture, left humerus, subsequent encounter for fracture with routine healing: Secondary | ICD-10-CM | POA: Diagnosis not present

## 2016-12-11 DIAGNOSIS — L97822 Non-pressure chronic ulcer of other part of left lower leg with fat layer exposed: Secondary | ICD-10-CM | POA: Diagnosis not present

## 2016-12-11 DIAGNOSIS — G629 Polyneuropathy, unspecified: Secondary | ICD-10-CM | POA: Insufficient documentation

## 2016-12-11 DIAGNOSIS — Z7982 Long term (current) use of aspirin: Secondary | ICD-10-CM | POA: Diagnosis not present

## 2016-12-11 DIAGNOSIS — D649 Anemia, unspecified: Secondary | ICD-10-CM | POA: Insufficient documentation

## 2016-12-11 DIAGNOSIS — I509 Heart failure, unspecified: Secondary | ICD-10-CM | POA: Diagnosis not present

## 2016-12-11 NOTE — Patient Instructions (Signed)
Begin weighing daily and call for an overnight weight gain of > 2 pounds or a weekly weight gain of >5 pounds. 

## 2016-12-11 NOTE — Progress Notes (Signed)
Patient ID: [name] [name], female    DOB: 1923/07/19, 83 y.o.   MRN: [id number]  HPI  [name] [name] is a 83 y/o female with a history of stroke, renal disorder, PVD, osteoporosis, HTN, hyperlipidemia, GERD, emphysema, CAD, anemia and chronic heart failure.   Last echo was done 11/09/16 with an EF of 55-60% with trivial TR.   Admitted 11/16/16 due to bradycardia and hypotension. She was given 2L NS bolus and glucagon and neosynephrine drip was started. Was transferred to ICU for further monitoring and treatment. Cardiology consult done. Discharged back to assisted living with PT after 2 days. Previous admission on 11/07/16 due to rapid atrial fibrillation. Cardiology consult was done. Initially treated with IV diuretics and transitioned to oral diuretics. Discharged after 2 days.  She presents today for her initial visit with fatigue with moderate exertion. Denies any shortness of breath but does endorse an intermittent cough along with wheezing on occasion. Denies any swelling in her lower legs and they are currently wrapped in unna boots and they get changed weekly by the home health nurse. Is not being weighed daily and review of weight chart looks like her weight has been stable. Not adding any salt to her food. Also endorses some dizziness.  Past Medical History:  Diagnosis Date  . Anemia   . CHF (congestive heart failure) ([place])   . Coronary artery disease   . Emphysema lung ([place])   . Emphysema of lung ([place])   . GERD (gastroesophageal reflux disease)   . Hyperlipidemia   . Hypertension   . Neuropathy ([place])   . Osteoporosis   . Peripheral vascular disease ([place])   . Renal disorder   . Stroke [facility])    Past Surgical History:  Procedure Laterality Date  . ABDOMINAL HYSTERECTOMY  1982  . CATARACT EXTRACTION    . CHOLECYSTECTOMY  1992  . JOINT REPLACEMENT  2005   Family History  Problem Relation Age of Onset  . Colon cancer Sister    Social History  Substance Use Topics  . Smoking  status: Never Smoker  . Smokeless tobacco: Never Used  . Alcohol use No   Allergies  Allergen Reactions  . Cholesterol Other (See Comments)    Patient states she is allergic to a cholesterol medication, which causes flu-like symptoms. Name is unknown.  . Morphine And Related     Heart problems   . Zoloft [Sertraline Hcl] Swelling   Prior to Admission medications   Medication Sig Start Date End Date Taking? Authorizing Provider  acetaminophen (TYLENOL) 325 MG tablet Take 650 mg by mouth every 6 (six) hours as needed for mild pain or moderate pain.   Yes Historical Provider, MD  ALPRAZolam (XANAX) 0.25 MG tablet Take 0.25 mg by mouth 2 (two) times daily as needed for anxiety or agitation. 11/03/16  Yes Historical Provider, MD  aspirin 81 MG tablet Take 81 mg by mouth daily.   Yes Historical Provider, MD  carvedilol (COREG) 12.5 MG tablet Take 1 tablet (12.5 mg total) by mouth 2 (two) times daily. 10/06/16 10/06/17 Yes Schuyler Amor, MD  Cholecalciferol 2000 units CAPS Take 4,000 Units by mouth daily.    Yes Historical Provider, MD  clopidogrel (PLAVIX) 75 MG tablet Take 1 tablet by mouth daily. 09/18/13  Yes Historical Provider, MD  cyanocobalamin 1000 MCG tablet Take 1,000 mcg by mouth daily.    Yes Historical Provider, MD  dicyclomine (BENTYL) 20 MG tablet Take 1 tablet (20 mg total) by mouth  3 (three) times daily as needed for spasms. 11/28/15  Yes Daymon Larsen, MD  feeding supplement (BOOST HIGH PROTEIN) LIQD Take 1 Container by mouth daily.   Yes Historical Provider, MD  FLECTOR 1.3 % PTCH Place 1 patch onto the skin every 12 (twelve) hours.  09/18/13  Yes Historical Provider, MD  furosemide (LASIX) 20 MG tablet Take 1 tablet (20 mg total) by mouth daily. Patient taking differently: Take 20 mg by mouth every other day.  10/06/16  Yes Schuyler Amor, MD  gabapentin (NEURONTIN) 300 MG capsule Take 1 capsule by mouth 3 (three) times daily.  09/04/13  Yes Historical Provider, MD   loratadine (CLARITIN) 10 MG tablet Take 10 mg by mouth daily.   Yes Historical Provider, MD  neomycin-bacitracin-polymyxin (NEOSPORIN) [phone] ointment Apply 1 application topically daily.   Yes Historical Provider, MD  nitroGLYCERIN (NITRODUR - DOSED IN MG/24 HR) 0.2 mg/hr patch Place 1 patch onto the skin daily.  09/18/13  Yes Historical Provider, MD  polyethylene glycol (MIRALAX / GLYCOLAX) packet Take 17 g by mouth daily.   Yes Historical Provider, MD  potassium chloride SA (K-DUR,KLOR-CON) 10 MEQ tablet Take 1 tablet (10 mEq total) by mouth daily. 11/09/16  Yes [name], MD  Probiotic Product (ALIGN) 4 MG CAPS Take 4 mg by mouth daily.   Yes Historical Provider, MD  ranitidine (ZANTAC) 150 MG capsule Take 150 mg by mouth 2 (two) times daily.   Yes Historical Provider, MD  sennosides-docusate sodium (SENOKOT-S) 8.6-50 MG tablet Take 2 tablets by mouth daily.   Yes Historical Provider, MD  traMADol (ULTRAM) 50 MG tablet Take 50 mg by mouth every 4 (four) hours as needed for moderate pain or severe pain.   Yes Historical Provider, MD  celecoxib (CELEBREX) 100 MG capsule Take 100 mg by mouth every other day as needed. 10/15/16   Historical Provider, MD  ondansetron (ZOFRAN) 4 MG tablet Take 1 tablet (4 mg total) by mouth every 8 (eight) hours as needed for nausea or vomiting. Patient not taking: Reported on 12/11/2016 11/28/15   Daymon Larsen, MD    Review of Systems  Constitutional: Positive for fatigue. Negative for appetite change.  HENT: Negative for congestion, postnasal drip and sore throat.   Eyes: Negative.   Respiratory: Positive for cough (sometimes productive) and wheezing. Negative for chest tightness and shortness of breath.   Cardiovascular: Negative for chest pain, palpitations and leg swelling.  Gastrointestinal: Negative for abdominal distention and abdominal pain.  Endocrine: Negative.   Genitourinary: Negative.   Musculoskeletal: Negative for arthralgias and  back pain.  Skin: Negative.   Allergic/Immunologic: Negative.   Neurological: Positive for dizziness and light-headedness.  Hematological: Negative for adenopathy. Does not bruise/bleed easily.  Psychiatric/Behavioral: Negative for dysphoric mood and sleep disturbance (sleeping on 2 pillows with oxygen at 2L). The patient is not nervous/anxious.    Vitals:   12/11/16 1354  BP: (!) 133/52  Pulse: 70  Resp: 18  SpO2: 100%  Weight: 145 lb 6 oz (65.9 kg)  Height: 5\' 6"  (1.676 m)   Wt Readings from Last 3 Encounters:  12/11/16 145 lb 6 oz (65.9 kg)  11/18/16 158 lb 3.2 oz (71.8 kg)  11/09/16 151 lb 1.6 oz (68.5 kg)   Lab Results  Component Value Date   CREATININE 1.90 (H) 11/18/2016   CREATININE 2.05 (H) 11/17/2016   CREATININE 2.18 (H) 11/17/2016    Physical Exam  Constitutional: She is oriented to person, place, and time. She  appears well-developed and well-nourished.  HENT:  Head: Normocephalic and atraumatic.  Eyes: Conjunctivae are normal. Pupils are equal, round, and reactive to light.  Neck: Normal range of motion. Neck supple. No JVD present.  Cardiovascular: Normal rate and regular rhythm.   Pulmonary/Chest: Effort normal. She has no wheezes. She has no rales.  Abdominal: Soft. She exhibits no distension. There is no tenderness.  Musculoskeletal: She exhibits no edema or tenderness.  Neurological: She is alert and oriented to person, place, and time.  Skin: Skin is warm and dry.  Psychiatric: She has a normal mood and affect. Her behavior is normal. Thought content normal.  Nursing note and vitals reviewed.  Assessment & Plan:  1: Chronic heart failure with preserved ejection fraction- - NYHA class II - euvolemic today - not currently being weighed daily. Order written for patient to be weighed daily and to call for an overnight weight gain of >2 pounds or a weekly weight gain of >5 pounds.  - not adding any salt to her food. Importance of closely following a  2000mg  sodium diet was discussed and written dietary information was given to her.  - saw cardiologist ([place]) on 11/24/16 and returns to him 02/16/17 - currently wearing unna boots at this time and gets them changed weekly by her home health nurse - wearing oxygen at 2L around the clock  2: HTN- - BP looks good today - continue medications - granddaughter to call patient's PCP to schedule a follow-up appointment  3: Dizziness- - receiving physical therapy at [place] where she currently lives at - encouraged slow position changes  Return in 1 month or sooner for any questions/problems before then.

## 2016-12-14 DIAGNOSIS — I87333 Chronic venous hypertension (idiopathic) with ulcer and inflammation of bilateral lower extremity: Secondary | ICD-10-CM | POA: Diagnosis not present

## 2016-12-14 DIAGNOSIS — L97822 Non-pressure chronic ulcer of other part of left lower leg with fat layer exposed: Secondary | ICD-10-CM | POA: Diagnosis not present

## 2016-12-14 DIAGNOSIS — F411 Generalized anxiety disorder: Secondary | ICD-10-CM | POA: Diagnosis not present

## 2016-12-14 DIAGNOSIS — I130 Hypertensive heart and chronic kidney disease with heart failure and stage 1 through stage 4 chronic kidney disease, or unspecified chronic kidney disease: Secondary | ICD-10-CM | POA: Diagnosis not present

## 2016-12-14 DIAGNOSIS — L97812 Non-pressure chronic ulcer of other part of right lower leg with fat layer exposed: Secondary | ICD-10-CM | POA: Diagnosis not present

## 2016-12-14 DIAGNOSIS — I509 Heart failure, unspecified: Secondary | ICD-10-CM | POA: Diagnosis not present

## 2016-12-14 DIAGNOSIS — M80022D Age-related osteoporosis with current pathological fracture, left humerus, subsequent encounter for fracture with routine healing: Secondary | ICD-10-CM | POA: Diagnosis not present

## 2016-12-15 DIAGNOSIS — I87333 Chronic venous hypertension (idiopathic) with ulcer and inflammation of bilateral lower extremity: Secondary | ICD-10-CM | POA: Diagnosis not present

## 2016-12-15 DIAGNOSIS — I130 Hypertensive heart and chronic kidney disease with heart failure and stage 1 through stage 4 chronic kidney disease, or unspecified chronic kidney disease: Secondary | ICD-10-CM | POA: Diagnosis not present

## 2016-12-15 DIAGNOSIS — I509 Heart failure, unspecified: Secondary | ICD-10-CM | POA: Diagnosis not present

## 2016-12-15 DIAGNOSIS — L97812 Non-pressure chronic ulcer of other part of right lower leg with fat layer exposed: Secondary | ICD-10-CM | POA: Diagnosis not present

## 2016-12-15 DIAGNOSIS — M80022D Age-related osteoporosis with current pathological fracture, left humerus, subsequent encounter for fracture with routine healing: Secondary | ICD-10-CM | POA: Diagnosis not present

## 2016-12-15 DIAGNOSIS — I5032 Chronic diastolic (congestive) heart failure: Secondary | ICD-10-CM | POA: Insufficient documentation

## 2016-12-15 DIAGNOSIS — I10 Essential (primary) hypertension: Secondary | ICD-10-CM | POA: Insufficient documentation

## 2016-12-15 DIAGNOSIS — L97822 Non-pressure chronic ulcer of other part of left lower leg with fat layer exposed: Secondary | ICD-10-CM | POA: Diagnosis not present

## 2016-12-18 DIAGNOSIS — M80022D Age-related osteoporosis with current pathological fracture, left humerus, subsequent encounter for fracture with routine healing: Secondary | ICD-10-CM | POA: Diagnosis not present

## 2016-12-18 DIAGNOSIS — L97812 Non-pressure chronic ulcer of other part of right lower leg with fat layer exposed: Secondary | ICD-10-CM | POA: Diagnosis not present

## 2016-12-18 DIAGNOSIS — I509 Heart failure, unspecified: Secondary | ICD-10-CM | POA: Diagnosis not present

## 2016-12-18 DIAGNOSIS — I87333 Chronic venous hypertension (idiopathic) with ulcer and inflammation of bilateral lower extremity: Secondary | ICD-10-CM | POA: Diagnosis not present

## 2016-12-18 DIAGNOSIS — I130 Hypertensive heart and chronic kidney disease with heart failure and stage 1 through stage 4 chronic kidney disease, or unspecified chronic kidney disease: Secondary | ICD-10-CM | POA: Diagnosis not present

## 2016-12-18 DIAGNOSIS — L97822 Non-pressure chronic ulcer of other part of left lower leg with fat layer exposed: Secondary | ICD-10-CM | POA: Diagnosis not present

## 2016-12-22 DIAGNOSIS — I130 Hypertensive heart and chronic kidney disease with heart failure and stage 1 through stage 4 chronic kidney disease, or unspecified chronic kidney disease: Secondary | ICD-10-CM | POA: Diagnosis not present

## 2016-12-22 DIAGNOSIS — I509 Heart failure, unspecified: Secondary | ICD-10-CM | POA: Diagnosis not present

## 2016-12-22 DIAGNOSIS — L97812 Non-pressure chronic ulcer of other part of right lower leg with fat layer exposed: Secondary | ICD-10-CM | POA: Diagnosis not present

## 2016-12-22 DIAGNOSIS — M80022D Age-related osteoporosis with current pathological fracture, left humerus, subsequent encounter for fracture with routine healing: Secondary | ICD-10-CM | POA: Diagnosis not present

## 2016-12-22 DIAGNOSIS — L97822 Non-pressure chronic ulcer of other part of left lower leg with fat layer exposed: Secondary | ICD-10-CM | POA: Diagnosis not present

## 2016-12-22 DIAGNOSIS — I87333 Chronic venous hypertension (idiopathic) with ulcer and inflammation of bilateral lower extremity: Secondary | ICD-10-CM | POA: Diagnosis not present

## 2016-12-23 DIAGNOSIS — I509 Heart failure, unspecified: Secondary | ICD-10-CM | POA: Diagnosis not present

## 2016-12-23 DIAGNOSIS — I87333 Chronic venous hypertension (idiopathic) with ulcer and inflammation of bilateral lower extremity: Secondary | ICD-10-CM | POA: Diagnosis not present

## 2016-12-23 DIAGNOSIS — M80022D Age-related osteoporosis with current pathological fracture, left humerus, subsequent encounter for fracture with routine healing: Secondary | ICD-10-CM | POA: Diagnosis not present

## 2016-12-23 DIAGNOSIS — L97822 Non-pressure chronic ulcer of other part of left lower leg with fat layer exposed: Secondary | ICD-10-CM | POA: Diagnosis not present

## 2016-12-23 DIAGNOSIS — I130 Hypertensive heart and chronic kidney disease with heart failure and stage 1 through stage 4 chronic kidney disease, or unspecified chronic kidney disease: Secondary | ICD-10-CM | POA: Diagnosis not present

## 2016-12-23 DIAGNOSIS — L97812 Non-pressure chronic ulcer of other part of right lower leg with fat layer exposed: Secondary | ICD-10-CM | POA: Diagnosis not present

## 2016-12-26 DIAGNOSIS — L97822 Non-pressure chronic ulcer of other part of left lower leg with fat layer exposed: Secondary | ICD-10-CM | POA: Diagnosis not present

## 2016-12-26 DIAGNOSIS — I739 Peripheral vascular disease, unspecified: Secondary | ICD-10-CM | POA: Diagnosis not present

## 2016-12-26 DIAGNOSIS — M6281 Muscle weakness (generalized): Secondary | ICD-10-CM | POA: Diagnosis not present

## 2016-12-26 DIAGNOSIS — I87333 Chronic venous hypertension (idiopathic) with ulcer and inflammation of bilateral lower extremity: Secondary | ICD-10-CM | POA: Diagnosis not present

## 2016-12-26 DIAGNOSIS — I130 Hypertensive heart and chronic kidney disease with heart failure and stage 1 through stage 4 chronic kidney disease, or unspecified chronic kidney disease: Secondary | ICD-10-CM | POA: Diagnosis not present

## 2016-12-26 DIAGNOSIS — L97812 Non-pressure chronic ulcer of other part of right lower leg with fat layer exposed: Secondary | ICD-10-CM | POA: Diagnosis not present

## 2016-12-29 DIAGNOSIS — L97812 Non-pressure chronic ulcer of other part of right lower leg with fat layer exposed: Secondary | ICD-10-CM | POA: Diagnosis not present

## 2016-12-29 DIAGNOSIS — I739 Peripheral vascular disease, unspecified: Secondary | ICD-10-CM | POA: Diagnosis not present

## 2016-12-29 DIAGNOSIS — L97822 Non-pressure chronic ulcer of other part of left lower leg with fat layer exposed: Secondary | ICD-10-CM | POA: Diagnosis not present

## 2016-12-29 DIAGNOSIS — I130 Hypertensive heart and chronic kidney disease with heart failure and stage 1 through stage 4 chronic kidney disease, or unspecified chronic kidney disease: Secondary | ICD-10-CM | POA: Diagnosis not present

## 2016-12-29 DIAGNOSIS — I87333 Chronic venous hypertension (idiopathic) with ulcer and inflammation of bilateral lower extremity: Secondary | ICD-10-CM | POA: Diagnosis not present

## 2016-12-29 DIAGNOSIS — M6281 Muscle weakness (generalized): Secondary | ICD-10-CM | POA: Diagnosis not present

## 2016-12-31 DIAGNOSIS — J3489 Other specified disorders of nose and nasal sinuses: Secondary | ICD-10-CM | POA: Diagnosis not present

## 2016-12-31 DIAGNOSIS — L97812 Non-pressure chronic ulcer of other part of right lower leg with fat layer exposed: Secondary | ICD-10-CM | POA: Diagnosis not present

## 2016-12-31 DIAGNOSIS — I130 Hypertensive heart and chronic kidney disease with heart failure and stage 1 through stage 4 chronic kidney disease, or unspecified chronic kidney disease: Secondary | ICD-10-CM | POA: Diagnosis not present

## 2016-12-31 DIAGNOSIS — I87333 Chronic venous hypertension (idiopathic) with ulcer and inflammation of bilateral lower extremity: Secondary | ICD-10-CM | POA: Diagnosis not present

## 2016-12-31 DIAGNOSIS — M6281 Muscle weakness (generalized): Secondary | ICD-10-CM | POA: Diagnosis not present

## 2016-12-31 DIAGNOSIS — L97822 Non-pressure chronic ulcer of other part of left lower leg with fat layer exposed: Secondary | ICD-10-CM | POA: Diagnosis not present

## 2016-12-31 DIAGNOSIS — J449 Chronic obstructive pulmonary disease, unspecified: Secondary | ICD-10-CM | POA: Diagnosis not present

## 2016-12-31 DIAGNOSIS — M80022D Age-related osteoporosis with current pathological fracture, left humerus, subsequent encounter for fracture with routine healing: Secondary | ICD-10-CM | POA: Diagnosis not present

## 2016-12-31 DIAGNOSIS — I739 Peripheral vascular disease, unspecified: Secondary | ICD-10-CM | POA: Diagnosis not present

## 2017-01-07 DIAGNOSIS — I739 Peripheral vascular disease, unspecified: Secondary | ICD-10-CM | POA: Diagnosis not present

## 2017-01-07 DIAGNOSIS — R011 Cardiac murmur, unspecified: Secondary | ICD-10-CM | POA: Diagnosis not present

## 2017-01-07 DIAGNOSIS — I87333 Chronic venous hypertension (idiopathic) with ulcer and inflammation of bilateral lower extremity: Secondary | ICD-10-CM | POA: Diagnosis not present

## 2017-01-07 DIAGNOSIS — L97822 Non-pressure chronic ulcer of other part of left lower leg with fat layer exposed: Secondary | ICD-10-CM | POA: Diagnosis not present

## 2017-01-07 DIAGNOSIS — I130 Hypertensive heart and chronic kidney disease with heart failure and stage 1 through stage 4 chronic kidney disease, or unspecified chronic kidney disease: Secondary | ICD-10-CM | POA: Diagnosis not present

## 2017-01-07 DIAGNOSIS — M6281 Muscle weakness (generalized): Secondary | ICD-10-CM | POA: Diagnosis not present

## 2017-01-07 DIAGNOSIS — L97812 Non-pressure chronic ulcer of other part of right lower leg with fat layer exposed: Secondary | ICD-10-CM | POA: Diagnosis not present

## 2017-01-08 DIAGNOSIS — I130 Hypertensive heart and chronic kidney disease with heart failure and stage 1 through stage 4 chronic kidney disease, or unspecified chronic kidney disease: Secondary | ICD-10-CM | POA: Diagnosis not present

## 2017-01-08 DIAGNOSIS — M6281 Muscle weakness (generalized): Secondary | ICD-10-CM | POA: Diagnosis not present

## 2017-01-08 DIAGNOSIS — L97812 Non-pressure chronic ulcer of other part of right lower leg with fat layer exposed: Secondary | ICD-10-CM | POA: Diagnosis not present

## 2017-01-08 DIAGNOSIS — I87333 Chronic venous hypertension (idiopathic) with ulcer and inflammation of bilateral lower extremity: Secondary | ICD-10-CM | POA: Diagnosis not present

## 2017-01-08 DIAGNOSIS — L97822 Non-pressure chronic ulcer of other part of left lower leg with fat layer exposed: Secondary | ICD-10-CM | POA: Diagnosis not present

## 2017-01-08 DIAGNOSIS — I739 Peripheral vascular disease, unspecified: Secondary | ICD-10-CM | POA: Diagnosis not present

## 2017-01-12 DIAGNOSIS — I87333 Chronic venous hypertension (idiopathic) with ulcer and inflammation of bilateral lower extremity: Secondary | ICD-10-CM | POA: Diagnosis not present

## 2017-01-12 DIAGNOSIS — I5033 Acute on chronic diastolic (congestive) heart failure: Secondary | ICD-10-CM | POA: Diagnosis not present

## 2017-01-12 DIAGNOSIS — L97812 Non-pressure chronic ulcer of other part of right lower leg with fat layer exposed: Secondary | ICD-10-CM | POA: Diagnosis not present

## 2017-01-12 DIAGNOSIS — I739 Peripheral vascular disease, unspecified: Secondary | ICD-10-CM | POA: Diagnosis not present

## 2017-01-12 DIAGNOSIS — I130 Hypertensive heart and chronic kidney disease with heart failure and stage 1 through stage 4 chronic kidney disease, or unspecified chronic kidney disease: Secondary | ICD-10-CM | POA: Diagnosis not present

## 2017-01-12 DIAGNOSIS — L97822 Non-pressure chronic ulcer of other part of left lower leg with fat layer exposed: Secondary | ICD-10-CM | POA: Diagnosis not present

## 2017-01-12 DIAGNOSIS — M6281 Muscle weakness (generalized): Secondary | ICD-10-CM | POA: Diagnosis not present

## 2017-01-13 DIAGNOSIS — I130 Hypertensive heart and chronic kidney disease with heart failure and stage 1 through stage 4 chronic kidney disease, or unspecified chronic kidney disease: Secondary | ICD-10-CM | POA: Diagnosis not present

## 2017-01-13 DIAGNOSIS — L97812 Non-pressure chronic ulcer of other part of right lower leg with fat layer exposed: Secondary | ICD-10-CM | POA: Diagnosis not present

## 2017-01-13 DIAGNOSIS — I87333 Chronic venous hypertension (idiopathic) with ulcer and inflammation of bilateral lower extremity: Secondary | ICD-10-CM | POA: Diagnosis not present

## 2017-01-13 DIAGNOSIS — F411 Generalized anxiety disorder: Secondary | ICD-10-CM | POA: Diagnosis not present

## 2017-01-13 DIAGNOSIS — L97822 Non-pressure chronic ulcer of other part of left lower leg with fat layer exposed: Secondary | ICD-10-CM | POA: Diagnosis not present

## 2017-01-13 DIAGNOSIS — M6281 Muscle weakness (generalized): Secondary | ICD-10-CM | POA: Diagnosis not present

## 2017-01-13 DIAGNOSIS — I739 Peripheral vascular disease, unspecified: Secondary | ICD-10-CM | POA: Diagnosis not present

## 2017-01-15 ENCOUNTER — Encounter: Payer: Self-pay | Admitting: Emergency Medicine

## 2017-01-15 ENCOUNTER — Ambulatory Visit: Payer: Medicare Other | Admitting: Family

## 2017-01-15 ENCOUNTER — Emergency Department
Admission: EM | Admit: 2017-01-15 | Discharge: 2017-01-15 | Disposition: A | Discharge status: Home / Self Care | Payer: Medicare Other | Attending: Emergency Medicine | Admitting: Emergency Medicine

## 2017-01-15 ENCOUNTER — Emergency Department: Payer: Medicare Other

## 2017-01-15 DIAGNOSIS — I509 Heart failure, unspecified: Secondary | ICD-10-CM | POA: Insufficient documentation

## 2017-01-15 DIAGNOSIS — R0602 Shortness of breath: Secondary | ICD-10-CM | POA: Diagnosis not present

## 2017-01-15 DIAGNOSIS — R0689 Other abnormalities of breathing: Secondary | ICD-10-CM | POA: Diagnosis not present

## 2017-01-15 DIAGNOSIS — Z9911 Dependence on respirator [ventilator] status: Secondary | ICD-10-CM | POA: Diagnosis not present

## 2017-01-15 DIAGNOSIS — R531 Weakness: Secondary | ICD-10-CM | POA: Insufficient documentation

## 2017-01-15 DIAGNOSIS — R0600 Dyspnea, unspecified: Secondary | ICD-10-CM | POA: Diagnosis not present

## 2017-01-15 DIAGNOSIS — I110 Hypertensive heart disease with heart failure: Secondary | ICD-10-CM | POA: Diagnosis not present

## 2017-01-15 DIAGNOSIS — I2510 Atherosclerotic heart disease of native coronary artery without angina pectoris: Secondary | ICD-10-CM | POA: Diagnosis not present

## 2017-01-15 DIAGNOSIS — R6889 Other general symptoms and signs: Secondary | ICD-10-CM | POA: Diagnosis not present

## 2017-01-15 DIAGNOSIS — M6281 Muscle weakness (generalized): Secondary | ICD-10-CM | POA: Diagnosis not present

## 2017-01-15 LAB — CBC WITH DIFFERENTIAL/PLATELET
BASOS ABS: 0 10*3/uL (ref 0–0.1)
BASOS PCT: 0 %
EOS ABS: 0.6 10*3/uL (ref 0–0.7)
Eosinophils Relative: 6 %
HEMATOCRIT: 27.2 % — AB (ref 35.0–47.0)
HEMOGLOBIN: 9 g/dL — AB (ref 12.0–16.0)
Lymphocytes Relative: 7 %
Lymphs Abs: 0.7 10*3/uL — ABNORMAL LOW (ref 1.0–3.6)
MCH: 32.7 pg (ref 26.0–34.0)
MCHC: 33.3 g/dL (ref 32.0–36.0)
MCV: 98.3 fL (ref 80.0–100.0)
Monocytes Absolute: 0.8 10*3/uL (ref 0.2–0.9)
Monocytes Relative: 8 %
NEUTROS ABS: 7.5 10*3/uL — AB (ref 1.4–6.5)
NEUTROS PCT: 79 %
Platelets: 103 10*3/uL — ABNORMAL LOW (ref 150–440)
RBC: 2.77 MIL/uL — ABNORMAL LOW (ref 3.80–5.20)
RDW: 16.2 % — AB (ref 11.5–14.5)
WBC: 9.6 10*3/uL (ref 3.6–11.0)

## 2017-01-15 LAB — URINALYSIS, COMPLETE (UACMP) WITH MICROSCOPIC
Bacteria, UA: NONE SEEN
Bilirubin Urine: NEGATIVE
GLUCOSE, UA: NEGATIVE mg/dL
Hgb urine dipstick: NEGATIVE
Ketones, ur: NEGATIVE mg/dL
LEUKOCYTES UA: NEGATIVE
Nitrite: NEGATIVE
PH: 5 (ref 5.0–8.0)
PROTEIN: 30 mg/dL — AB
Specific Gravity, Urine: 1.012 (ref 1.005–1.030)
Squamous Epithelial / HPF: NONE SEEN

## 2017-01-15 LAB — COMPREHENSIVE METABOLIC PANEL
ALBUMIN: 3.3 g/dL — AB (ref 3.5–5.0)
ALT: 11 U/L — ABNORMAL LOW (ref 14–54)
ANION GAP: 6 (ref 5–15)
AST: 19 U/L (ref 15–41)
Alkaline Phosphatase: 108 U/L (ref 38–126)
BUN: 60 mg/dL — AB (ref 6–20)
CHLORIDE: 102 mmol/L (ref 101–111)
CO2: 30 mmol/L (ref 22–32)
Calcium: 9.4 mg/dL (ref 8.9–10.3)
Creatinine, Ser: 2.02 mg/dL — ABNORMAL HIGH (ref 0.44–1.00)
GFR calc Af Amer: 23 mL/min — ABNORMAL LOW (ref >60)
GFR, EST NON AFRICAN AMERICAN: 20 mL/min — AB (ref >60)
Glucose, Bld: 123 mg/dL — ABNORMAL HIGH (ref 65–99)
POTASSIUM: 4.5 mmol/L (ref 3.5–5.1)
SODIUM: 138 mmol/L (ref 135–145)
Total Bilirubin: 0.7 mg/dL (ref 0.3–1.2)
Total Protein: 6.6 g/dL (ref 6.5–8.1)

## 2017-01-15 LAB — BLOOD GAS, ARTERIAL
Acid-Base Excess: 7.4 mmol/L — ABNORMAL HIGH (ref 0.0–2.0)
BICARBONATE: 33.7 mmol/L — AB (ref 20.0–28.0)
FIO2: 0.28
O2 SAT: 97 %
PATIENT TEMPERATURE: 37
PO2 ART: 92 mmHg (ref 83.0–108.0)
pCO2 arterial: 57 mmHg — ABNORMAL HIGH (ref 32.0–48.0)
pH, Arterial: 7.38 (ref 7.350–7.450)

## 2017-01-15 LAB — BRAIN NATRIURETIC PEPTIDE: B NATRIURETIC PEPTIDE 5: 340 pg/mL — AB (ref 0.0–100.0)

## 2017-01-15 LAB — LACTIC ACID, PLASMA: LACTIC ACID, VENOUS: 0.9 mmol/L (ref 0.5–1.9)

## 2017-01-15 LAB — TROPONIN I

## 2017-01-15 NOTE — ED Notes (Signed)
X-ray at bedside

## 2017-01-15 NOTE — ED Provider Notes (Signed)
[facility]  I accepted care from Dr. Beather Arbour ____________________________________________    LABS (pertinent positives/negatives)  Labs Reviewed  CBC WITH DIFFERENTIAL/PLATELET - Abnormal; Notable for the following:       Result Value   RBC 2.77 (*)    Hemoglobin 9.0 (*)    HCT 27.2 (*)    RDW 16.2 (*)    Platelets 103 (*)    Neutro Abs 7.5 (*)    Lymphs Abs 0.7 (*)    All other components within normal limits  COMPREHENSIVE METABOLIC PANEL - Abnormal; Notable for the following:    Glucose, Bld 123 (*)    BUN 60 (*)    Creatinine, Ser 2.02 (*)    Albumin 3.3 (*)    ALT 11 (*)    GFR calc non Af Amer 20 (*)    GFR calc Af Amer 23 (*)    All other components within normal limits  BRAIN NATRIURETIC PEPTIDE - Abnormal; Notable for the following:    B Natriuretic Peptide 340.0 (*)    All other components within normal limits  BLOOD GAS, ARTERIAL - Abnormal; Notable for the following:    pCO2 arterial 57 (*)    Bicarbonate 33.7 (*)    Acid-Base Excess 7.4 (*)    All other components within normal limits  URINALYSIS, COMPLETE (UACMP) WITH MICROSCOPIC - Abnormal; Notable for the following:    Color, Urine YELLOW (*)    APPearance CLEAR (*)    Protein, ur 30 (*)    All other components within normal limits  URINE CULTURE  TROPONIN I  LACTIC ACID, PLASMA    ____________________________________________   INITIAL IMPRESSION / ASSESSMENT AND PLAN / ED COURSE   Pertinent labs & imaging results that were available during my care of the patient were reviewed by me and considered in my medical decision making (see chart for details).  Signed out awaiting urinalysis.  UA negative for UTI.  Patient discharged with Dr. Asher Muir prepared discharge instructions.   CONSULTATIONS: None    Patient / Family / Caregiver informed of clinical course, medical decision-making process, and agree with  plan.    ____________________________________________   FINAL CLINICAL IMPRESSION(S) / ED DIAGNOSES  Final diagnoses:  Generalized weakness  Shortness of breath        Lisa Roca, MD 01/15/17 1023

## 2017-01-15 NOTE — Discharge Instructions (Signed)
Return to the ER for worsening symptoms, persistent vomiting, difficulty breathing or other concerns. °

## 2017-01-15 NOTE — ED Notes (Signed)
Daphne at [facility] informed that pt will be coming back by EMS with copy of discharge papers and copy of test results from today.

## 2017-01-15 NOTE — ED Triage Notes (Signed)
Pt presents to ED via EMS from [facility] with c/o weakness and sob. Pt states she was going to use the restroom and felt very weak and sob. Pt reports hx of the same. EMS state O2 saturation was in the upper 70's upon their arrival. Nasal cannula resting on her forehead at that time. Much improved after placing it back inside of her nares.

## 2017-01-15 NOTE — ED Notes (Signed)
ED Provider at bedside. 

## 2017-01-15 NOTE — ED Provider Notes (Signed)
[facility] Emergency Department Provider Note   ____________________________________________   First MD Initiated Contact with Patient 01/15/17 [phone]     (approximate)  I have reviewed the triage vital signs and the nursing notes.   HISTORY  Chief Complaint Shortness of Breath and Weakness    HPI VENNESSA [name] is a 83 y.o. female brought to the ED via EMS from [facility] assisted living with a chief complaint of generalized weakness and shortness of breath.Patient has a history of COPD, CHF on continuous oxygen. Reports she awoke to use the restroom and felt very weak generally. Also complained of shortness of breath. On EMS arrival, patient's nasal cannula oxygen was not in her nostrils and her room air saturations was in the 70%'s. Her nasal cannula was placed correctly and her shortness of breath improved but she was still feeling very weak. Symptoms associated with nausea only. Denies fever, chills, chest pain, abdominal pain, vomiting, dysuria, diarrhea. Denies recent travel or trauma. Nothing makes her symptoms better. Exertion makes her symptoms worse.   Past Medical History:  Diagnosis Date  . Anemia   . CHF (congestive heart failure) ([place])   . Coronary artery disease   . Emphysema lung ([place])   . Emphysema of lung ([place])   . GERD (gastroesophageal reflux disease)   . Hyperlipidemia   . Hypertension   . Neuropathy ([place])   . Osteoporosis   . Peripheral vascular disease ([place])   . Renal disorder   . Stroke [facility])     Patient Active Problem List   Diagnosis Date Noted  . Chronic diastolic heart failure ([place]) 12/15/2016  . HTN (hypertension) 12/15/2016  . Hypotension 11/16/2016  . CAP (community acquired pneumonia) 11/07/2016  . Generalized anxiety disorder 09/26/2016  . PAD (peripheral artery disease) ([place]) 10/05/2013  . Chronic venous insufficiency 10/05/2013    Past Surgical History:  Procedure Laterality Date  . ABDOMINAL  HYSTERECTOMY  1982  . CATARACT EXTRACTION    . CHOLECYSTECTOMY  1992  . JOINT REPLACEMENT  2005    Prior to Admission medications   Medication Sig Start Date End Date Taking? Authorizing Provider  acetaminophen (TYLENOL) 325 MG tablet Take 650 mg by mouth every 6 (six) hours as needed for mild pain or moderate pain.    Historical Provider, MD  ALPRAZolam Duanne Moron) 0.25 MG tablet Take 0.25 mg by mouth 2 (two) times daily as needed for anxiety or agitation. 11/03/16   Historical Provider, MD  aspirin 81 MG tablet Take 81 mg by mouth daily.    Historical Provider, MD  carvedilol (COREG) 12.5 MG tablet Take 1 tablet (12.5 mg total) by mouth 2 (two) times daily. 10/06/16 10/06/17  Schuyler Amor, MD  celecoxib (CELEBREX) 100 MG capsule Take 100 mg by mouth every other day as needed. 10/15/16   Historical Provider, MD  Cholecalciferol 2000 units CAPS Take 4,000 Units by mouth daily.     Historical Provider, MD  clopidogrel (PLAVIX) 75 MG tablet Take 1 tablet by mouth daily. 09/18/13   Historical Provider, MD  cyanocobalamin 1000 MCG tablet Take 1,000 mcg by mouth daily.     Historical Provider, MD  dicyclomine (BENTYL) 20 MG tablet Take 1 tablet (20 mg total) by mouth 3 (three) times daily as needed for spasms. 11/28/15   Daymon Larsen, MD  feeding supplement (BOOST HIGH PROTEIN) LIQD Take 1 Container by mouth daily.    Historical Provider, MD  FLECTOR 1.3 % PTCH Place 1 patch onto the  skin every 12 (twelve) hours.  09/18/13   Historical Provider, MD  furosemide (LASIX) 20 MG tablet Take 1 tablet (20 mg total) by mouth daily. Patient taking differently: Take 20 mg by mouth every other day.  10/06/16   Schuyler Amor, MD  gabapentin (NEURONTIN) 300 MG capsule Take 1 capsule by mouth 3 (three) times daily.  09/04/13   Historical Provider, MD  loratadine (CLARITIN) 10 MG tablet Take 10 mg by mouth daily.    Historical Provider, MD  neomycin-bacitracin-polymyxin (NEOSPORIN) [phone] ointment Apply 1  application topically daily.    Historical Provider, MD  nitroGLYCERIN (NITRODUR - DOSED IN MG/24 HR) 0.2 mg/hr patch Place 1 patch onto the skin daily.  09/18/13   Historical Provider, MD  ondansetron (ZOFRAN) 4 MG tablet Take 1 tablet (4 mg total) by mouth every 8 (eight) hours as needed for nausea or vomiting. Patient not taking: Reported on 12/11/2016 11/28/15   Daymon Larsen, MD  polyethylene glycol [facility]) packet Take 17 g by mouth daily.    Historical Provider, MD  potassium chloride SA (K-DUR,KLOR-CON) 10 MEQ tablet Take 1 tablet (10 mEq total) by mouth daily. 11/09/16   [name], MD  Probiotic Product (ALIGN) 4 MG CAPS Take 4 mg by mouth daily.    Historical Provider, MD  ranitidine (ZANTAC) 150 MG capsule Take 150 mg by mouth 2 (two) times daily.    Historical Provider, MD  sennosides-docusate sodium (SENOKOT-S) 8.6-50 MG tablet Take 2 tablets by mouth daily.    Historical Provider, MD  traMADol (ULTRAM) 50 MG tablet Take 50 mg by mouth every 4 (four) hours as needed for moderate pain or severe pain.    Historical Provider, MD    Allergies Cholesterol; Morphine and related; and Zoloft [sertraline hcl]  Family History  Problem Relation Age of Onset  . Colon cancer Sister     Social History Social History  Substance Use Topics  . Smoking status: Never Smoker  . Smokeless tobacco: Never Used  . Alcohol use No    Review of Systems  Constitutional: No fever/chills. Eyes: No visual changes. ENT: No sore throat. Cardiovascular: Denies chest pain. Respiratory: Positive for shortness of breath. Gastrointestinal: No abdominal pain.  Positive for nausea, no vomiting.  No diarrhea.  No constipation. Genitourinary: Negative for dysuria. Musculoskeletal: Negative for back pain. Skin: Negative for rash. Neurological: Negative for headaches, focal weakness or numbness.  10-point ROS otherwise  negative.  ____________________________________________   PHYSICAL EXAM:  VITAL SIGNS: ED Triage Vitals  Enc Vitals Group     BP 01/15/17 0454 124/61     Pulse Rate 01/15/17 0454 85     Resp 01/15/17 0454 18     Temp 01/15/17 0454 98 F (36.7 C)     Temp Source 01/15/17 0454 Oral     SpO2 01/15/17 0454 96 %     Weight 01/15/17 0456 142 lb (64.4 kg)     Height 01/15/17 0456 5\' 6"  (1.676 m)     Head Circumference --      Peak Flow --      Pain Score --      Pain Loc --      Pain Edu? --      Excl. in [place]? --     Constitutional: Alert and oriented. Frail-appearing and in no acute distress. Eyes: Conjunctivae are normal. PERRL. EOMI. Head: Atraumatic. Nose: No congestion/rhinnorhea. Mouth/Throat: Mucous membranes are moist.  Oropharynx non-erythematous. Neck: No stridor.   Cardiovascular:  Normal rate, regular rhythm. II/VI SEM.  Good peripheral circulation. Respiratory: Normal respiratory effort.  No retractions. Lungs with rales bibasilarly. Gastrointestinal: Soft and nontender. No distention. No abdominal bruits. No CVA tenderness. Musculoskeletal: BLE compression wraps.  No joint effusions. Neurologic:  Normal speech and language. No gross focal neurologic deficits are appreciated.  Skin:  Skin is warm, dry and intact. No rash noted. Psychiatric: Mood and affect are normal. Speech and behavior are normal.  ____________________________________________   LABS (all labs ordered are listed, but only abnormal results are displayed)  Labs Reviewed  CBC WITH DIFFERENTIAL/PLATELET - Abnormal; Notable for the following:       Result Value   RBC 2.77 (*)    Hemoglobin 9.0 (*)    HCT 27.2 (*)    RDW 16.2 (*)    Platelets 103 (*)    Neutro Abs 7.5 (*)    Lymphs Abs 0.7 (*)    All other components within normal limits  COMPREHENSIVE METABOLIC PANEL - Abnormal; Notable for the following:    Glucose, Bld 123 (*)    BUN 60 (*)    Creatinine, Ser 2.02 (*)    Albumin 3.3 (*)     ALT 11 (*)    GFR calc non Af Amer 20 (*)    GFR calc Af Amer 23 (*)    All other components within normal limits  BRAIN NATRIURETIC PEPTIDE - Abnormal; Notable for the following:    B Natriuretic Peptide 340.0 (*)    All other components within normal limits  BLOOD GAS, ARTERIAL - Abnormal; Notable for the following:    pCO2 arterial 57 (*)    Bicarbonate 33.7 (*)    Acid-Base Excess 7.4 (*)    All other components within normal limits  URINE CULTURE  TROPONIN I  LACTIC ACID, PLASMA  URINALYSIS, COMPLETE (UACMP) WITH MICROSCOPIC   ____________________________________________  EKG  ED ECG REPORT I, Carr Shartzer J, the attending physician, personally viewed and interpreted this ECG.   Date: 01/15/2017  EKG Time: 0501  Rate: 82  Rhythm: normal EKG, normal sinus rhythm  Axis: Normal  Intervals:none  ST&T Change: Nonspecific  ____________________________________________  RADIOLOGY  Portable chest x-ray interpreted per Dr. Marisue Humble: Cardiomegaly with vascular congestion. Stable bilateral pleural  effusions.   ____________________________________________   PROCEDURES  Procedure(s) performed: None  Procedures  Critical Care performed: No  ____________________________________________   INITIAL IMPRESSION / ASSESSMENT AND PLAN / ED COURSE  Pertinent labs & imaging results that were available during my care of the patient were reviewed by me and considered in my medical decision making (see chart for details).  83 year old female who presents from assisted living facility with generalized weakness and shortness of breath; history of COPD and CHF. Saturations are 100% on nasal cannula oxygen. Laboratory results remarkable for baseline anemia and baseline renal insufficiency. ABG on oxygen unremarkable. Chest x-ray does not show signs of pneumonia but is more consistent with pulmonary vascular congestion. Awaiting lactic acid and urinalysis.  Clinical Course as of Jan 15 714  Fri Jan 15, 2017  0711 Patient is feeling much better and resting in no acute distress. Updated patient and family member of laboratory and imaging results. Awaiting urine specimen. Discussed with care manager; patient does not meet hospitalization criteria. Patient and family tell me that nursing home staff usually assists her with ambulation and ADLs and they are comfortable returning to [facility]. Care transferred to Dr. Reita Cliche pending results of urinalysis +/- addition of antibiotics on discharge.  [  JS]    Clinical Course User Index [JS] Paulette Blanch, MD     ____________________________________________   FINAL CLINICAL IMPRESSION(S) / ED DIAGNOSES  Final diagnoses:  Generalized weakness  Shortness of breath      NEW MEDICATIONS STARTED DURING THIS VISIT:  New Prescriptions   No medications on file     Note:  This document was prepared using Dragon voice recognition software and may include unintentional dictation errors.    Paulette Blanch, MD 01/15/17 [phone]

## 2017-01-16 LAB — URINE CULTURE: Culture: NO GROWTH

## 2017-01-18 ENCOUNTER — Ambulatory Visit: Payer: Medicare Other | Attending: Family | Admitting: Family

## 2017-01-18 VITALS — BP 146/87 | HR 71 | Resp 18 | Ht 66.0 in | Wt 143.4 lb

## 2017-01-18 DIAGNOSIS — K219 Gastro-esophageal reflux disease without esophagitis: Secondary | ICD-10-CM | POA: Diagnosis not present

## 2017-01-18 DIAGNOSIS — Z9049 Acquired absence of other specified parts of digestive tract: Secondary | ICD-10-CM | POA: Insufficient documentation

## 2017-01-18 DIAGNOSIS — Z9849 Cataract extraction status, unspecified eye: Secondary | ICD-10-CM | POA: Diagnosis not present

## 2017-01-18 DIAGNOSIS — Z7901 Long term (current) use of anticoagulants: Secondary | ICD-10-CM | POA: Insufficient documentation

## 2017-01-18 DIAGNOSIS — N289 Disorder of kidney and ureter, unspecified: Secondary | ICD-10-CM | POA: Insufficient documentation

## 2017-01-18 DIAGNOSIS — F419 Anxiety disorder, unspecified: Secondary | ICD-10-CM | POA: Diagnosis not present

## 2017-01-18 DIAGNOSIS — I5032 Chronic diastolic (congestive) heart failure: Secondary | ICD-10-CM

## 2017-01-18 DIAGNOSIS — I4891 Unspecified atrial fibrillation: Secondary | ICD-10-CM | POA: Diagnosis not present

## 2017-01-18 DIAGNOSIS — Z885 Allergy status to narcotic agent status: Secondary | ICD-10-CM | POA: Diagnosis not present

## 2017-01-18 DIAGNOSIS — Z8673 Personal history of transient ischemic attack (TIA), and cerebral infarction without residual deficits: Secondary | ICD-10-CM | POA: Diagnosis not present

## 2017-01-18 DIAGNOSIS — Z90710 Acquired absence of both cervix and uterus: Secondary | ICD-10-CM | POA: Diagnosis not present

## 2017-01-18 DIAGNOSIS — Z800 Family history of malignant neoplasm of digestive organs: Secondary | ICD-10-CM | POA: Insufficient documentation

## 2017-01-18 DIAGNOSIS — E785 Hyperlipidemia, unspecified: Secondary | ICD-10-CM | POA: Insufficient documentation

## 2017-01-18 DIAGNOSIS — J439 Emphysema, unspecified: Secondary | ICD-10-CM | POA: Insufficient documentation

## 2017-01-18 DIAGNOSIS — F411 Generalized anxiety disorder: Secondary | ICD-10-CM

## 2017-01-18 DIAGNOSIS — I739 Peripheral vascular disease, unspecified: Secondary | ICD-10-CM | POA: Insufficient documentation

## 2017-01-18 DIAGNOSIS — I110 Hypertensive heart disease with heart failure: Secondary | ICD-10-CM | POA: Diagnosis not present

## 2017-01-18 DIAGNOSIS — I2510 Atherosclerotic heart disease of native coronary artery without angina pectoris: Secondary | ICD-10-CM | POA: Diagnosis not present

## 2017-01-18 DIAGNOSIS — Z7982 Long term (current) use of aspirin: Secondary | ICD-10-CM | POA: Diagnosis not present

## 2017-01-18 DIAGNOSIS — M810 Age-related osteoporosis without current pathological fracture: Secondary | ICD-10-CM | POA: Diagnosis not present

## 2017-01-18 DIAGNOSIS — I10 Essential (primary) hypertension: Secondary | ICD-10-CM

## 2017-01-18 DIAGNOSIS — G629 Polyneuropathy, unspecified: Secondary | ICD-10-CM | POA: Diagnosis not present

## 2017-01-18 DIAGNOSIS — Z9660 Presence of unspecified orthopedic joint implant: Secondary | ICD-10-CM | POA: Diagnosis not present

## 2017-01-18 DIAGNOSIS — D649 Anemia, unspecified: Secondary | ICD-10-CM | POA: Insufficient documentation

## 2017-01-18 DIAGNOSIS — Z888 Allergy status to other drugs, medicaments and biological substances status: Secondary | ICD-10-CM | POA: Diagnosis not present

## 2017-01-18 NOTE — Progress Notes (Signed)
Patient ID: [name] [name], female    DOB: 12-Dec-1922, 83 y.o.   MRN: [id number]  HPI  [name] [name] is a 83 y/o female with a history of stroke, renal disorder, PVD, osteoporosis, HTN, hyperlipidemia, GERD, emphysema, CAD, anemia and chronic heart failure.   Last echo was done 11/09/16 with an EF of 55-60% with trivial TR.   In the ED on 01/15/17 due to weakness and shortness of breath. Initial O2 sat by EMS was 70% but that's because her oxygen wasn't in her nose. Once oxygen placed correctly, shortness of breath improved. Discharged back to [facility] [place]. Admitted 11/16/16 due to bradycardia and hypotension. She was given 2L NS bolus and glucagon and neosynephrine drip was started. Was transferred to ICU for further monitoring and treatment. Cardiology consult done. Discharged back to assisted living with PT after 2 days. Previous admission on 11/07/16 due to rapid atrial fibrillation. Cardiology consult was done. Initially treated with IV diuretics and transitioned to oral diuretics. Discharged after 2 days.  She presents today for her follow-up visit with fatigue with moderate exertion. Denies any shortness of breath. Denies any swelling in her lower legs and they are currently wrapped in unna boots and they get changed weekly by the home health nurse. Is not being weighed daily although the home health nurse weighs her weekly. Continues to wear a brace on her left upper arm due to broken bone sustained November 2017. Is undergoing ultrasound therapy 20 minutes daily to promote bone regrowth. Patient has improvement in range of motion since beginning the ultrasound.   Past Medical History:  Diagnosis Date  . Anemia   . CHF (congestive heart failure) ([place])   . Coronary artery disease   . Emphysema lung ([place])   . Emphysema of lung ([place])   . GERD (gastroesophageal reflux disease)   . Hyperlipidemia   . Hypertension   . Neuropathy ([place])   . Osteoporosis   . Peripheral vascular disease ([place])   . Renal  disorder   . Stroke [facility])    Past Surgical History:  Procedure Laterality Date  . ABDOMINAL HYSTERECTOMY  1982  . CATARACT EXTRACTION    . CHOLECYSTECTOMY  1992  . JOINT REPLACEMENT  2005   Family History  Problem Relation Age of Onset  . Colon cancer Sister    Social History  Substance Use Topics  . Smoking status: Never Smoker  . Smokeless tobacco: Never Used  . Alcohol use No   Allergies  Allergen Reactions  . Cholesterol Other (See Comments)    Patient states she is allergic to a cholesterol medication, which causes flu-like symptoms. Name is unknown.  . Morphine And Related     Heart problems   . Zoloft [Sertraline Hcl] Swelling   Prior to Admission medications   Medication Sig Start Date End Date Taking? Authorizing Provider  acetaminophen (TYLENOL) 325 MG tablet Take 650 mg by mouth every 6 (six) hours as needed for mild pain or moderate pain.   Yes Historical Provider, MD  ALPRAZolam (XANAX) 0.25 MG tablet Take 0.25 mg by mouth 2 (two) times daily as needed for anxiety or agitation. 11/03/16  Yes Historical Provider, MD  aspirin 81 MG tablet Take 81 mg by mouth daily.   Yes Historical Provider, MD  carvedilol (COREG) 12.5 MG tablet Take 1 tablet (12.5 mg total) by mouth 2 (two) times daily. 10/06/16 10/06/17 Yes Schuyler Amor, MD  celecoxib (CELEBREX) 100 MG capsule Take 100 mg by mouth every other day  as needed. 10/15/16  Yes Historical Provider, MD  Cholecalciferol 2000 units CAPS Take 4,000 Units by mouth daily.    Yes Historical Provider, MD  clopidogrel (PLAVIX) 75 MG tablet Take 1 tablet by mouth daily. 09/18/13  Yes Historical Provider, MD  cyanocobalamin 1000 MCG tablet Take 1,000 mcg by mouth daily.    Yes Historical Provider, MD  dicyclomine (BENTYL) 20 MG tablet Take 1 tablet (20 mg total) by mouth 3 (three) times daily as needed for spasms. 11/28/15  Yes Daymon Larsen, MD  feeding supplement (BOOST HIGH PROTEIN) LIQD Take 1 Container by mouth daily.   Yes  Historical Provider, MD  FLECTOR 1.3 % PTCH Place 1 patch onto the skin every 12 (twelve) hours.  09/18/13  Yes Historical Provider, MD  furosemide (LASIX) 20 MG tablet Take 1 tablet (20 mg total) by mouth daily. Patient taking differently: Take 20 mg by mouth every other day.  10/06/16  Yes Schuyler Amor, MD  gabapentin (NEURONTIN) 300 MG capsule Take 1 capsule by mouth 3 (three) times daily.  09/04/13  Yes Historical Provider, MD  loratadine (CLARITIN) 10 MG tablet Take 10 mg by mouth daily.   Yes Historical Provider, MD  neomycin-bacitracin-polymyxin (NEOSPORIN) [phone] ointment Apply 1 application topically daily.   Yes Historical Provider, MD  nitroGLYCERIN (NITRODUR - DOSED IN MG/24 HR) 0.2 mg/hr patch Place 1 patch onto the skin daily.  09/18/13  Yes Historical Provider, MD  ondansetron (ZOFRAN) 4 MG tablet Take 1 tablet (4 mg total) by mouth every 8 (eight) hours as needed for nausea or vomiting. 11/28/15  Yes Daymon Larsen, MD  polyethylene glycol [facility] / GLYCOLAX) packet Take 17 g by mouth daily.   Yes Historical Provider, MD  potassium chloride SA (K-DUR,KLOR-CON) 10 MEQ tablet Take 1 tablet (10 mEq total) by mouth daily. 11/09/16  Yes [name], MD  Probiotic Product (ALIGN) 4 MG CAPS Take 4 mg by mouth daily.   Yes Historical Provider, MD  ranitidine (ZANTAC) 150 MG capsule Take 150 mg by mouth 2 (two) times daily.   Yes Historical Provider, MD  sennosides-docusate sodium (SENOKOT-S) 8.6-50 MG tablet Take 2 tablets by mouth daily.   Yes Historical Provider, MD  traMADol (ULTRAM) 50 MG tablet Take 50 mg by mouth every 4 (four) hours as needed for moderate pain or severe pain.   Yes Historical Provider, MD    Review of Systems  Constitutional: Positive for fatigue. Negative for appetite change.  HENT: Negative for congestion, postnasal drip and sore throat.   Eyes: Negative.   Respiratory: Negative for cough, chest tightness and shortness of breath.   Cardiovascular:  Negative for chest pain, palpitations and leg swelling.  Gastrointestinal: Negative for abdominal distention and abdominal pain.  Endocrine: Negative.   Genitourinary: Negative.   Musculoskeletal: Positive for arthralgias (left upper arm). Negative for back pain.  Skin: Negative.   Allergic/Immunologic: Negative.   Neurological: Positive for numbness (bilateral feet). Negative for light-headedness.  Hematological: Negative for adenopathy. Does not bruise/bleed easily.  Psychiatric/Behavioral: Negative for dysphoric mood and sleep disturbance (sleeping well with medication). The patient is nervous/anxious.    Vitals:   01/18/17 1321  BP: (!) 146/87  Pulse: 71  Resp: 18  Weight: 143 lb 6 oz (65 kg)  Height: 5\' 6"  (1.676 m)   Wt Readings from Last 3 Encounters:  01/18/17 143 lb 6 oz (65 kg)  01/15/17 142 lb (64.4 kg)  12/11/16 145 lb 6 oz (65.9 kg)   Lab  Results  Component Value Date   CREATININE 2.02 (H) 01/15/2017   CREATININE 1.90 (H) 11/18/2016   CREATININE 2.05 (H) 11/17/2016   Physical Exam  Constitutional: She is oriented to person, place, and time. She appears well-developed and well-nourished.  HENT:  Head: Normocephalic and atraumatic.  Eyes: Conjunctivae are normal. Pupils are equal, round, and reactive to light.  Neck: Normal range of motion. Neck supple. No JVD present.  Cardiovascular: Normal rate and regular rhythm.   Pulmonary/Chest: Effort normal. She has no wheezes. She has no rales.  Abdominal: Soft. She exhibits no distension. There is no tenderness.  Musculoskeletal: She exhibits tenderness (left upper arm). She exhibits no edema.  Neurological: She is alert and oriented to person, place, and time.  Skin: Skin is warm and dry.  Psychiatric: She has a normal mood and affect. Her behavior is normal. Thought content normal.  Nursing note and vitals reviewed.    Assessment & Plan:  1: Chronic heart failure with preserved ejection fraction- - NYHA class  II - euvolemic today - not currently being weighed daily but on a weekly basis by home health nurse. Discussed the importance of weighing daily and calling for an overnight weight gain of >2 pounds or a weekly weight gain of >5 pounds.  - not adding any salt to her food. Importance of closely following a 2000mg  sodium diet was discussed. - saw cardiologist ([place]) on 11/24/16 and returns to him 02/16/17 - currently wearing unna boots at this time and gets them changed weekly by her home health nurse - wearing oxygen at 2L around the clock - handicap parking form filled out today  2: HTN- - BP looks good today - continue medications - patient now being followed by PCP at [facility]  3: Anxiety- - patient reports that this is pretty well controlled at this time - takes alprazolam twice daily as needed  Medication list was reviewed.  Return here in 4 months or sooner for any questions/problems before then.

## 2017-01-18 NOTE — Patient Instructions (Signed)
Resume weighing daily and call for an overnight weight gain of > 2 pounds or a weekly weight gain of >5 pounds. 

## 2017-01-19 ENCOUNTER — Encounter: Payer: Self-pay | Admitting: Family

## 2017-01-19 DIAGNOSIS — M6281 Muscle weakness (generalized): Secondary | ICD-10-CM | POA: Diagnosis not present

## 2017-01-19 DIAGNOSIS — L97812 Non-pressure chronic ulcer of other part of right lower leg with fat layer exposed: Secondary | ICD-10-CM | POA: Diagnosis not present

## 2017-01-19 DIAGNOSIS — I739 Peripheral vascular disease, unspecified: Secondary | ICD-10-CM | POA: Diagnosis not present

## 2017-01-19 DIAGNOSIS — I87333 Chronic venous hypertension (idiopathic) with ulcer and inflammation of bilateral lower extremity: Secondary | ICD-10-CM | POA: Diagnosis not present

## 2017-01-19 DIAGNOSIS — I130 Hypertensive heart and chronic kidney disease with heart failure and stage 1 through stage 4 chronic kidney disease, or unspecified chronic kidney disease: Secondary | ICD-10-CM | POA: Diagnosis not present

## 2017-01-19 DIAGNOSIS — L97822 Non-pressure chronic ulcer of other part of left lower leg with fat layer exposed: Secondary | ICD-10-CM | POA: Diagnosis not present

## 2017-01-22 DIAGNOSIS — M79602 Pain in left arm: Secondary | ICD-10-CM | POA: Diagnosis not present

## 2017-01-22 DIAGNOSIS — M80022A Age-related osteoporosis with current pathological fracture, left humerus, initial encounter for fracture: Secondary | ICD-10-CM | POA: Diagnosis not present

## 2017-01-22 DIAGNOSIS — S42352G Displaced comminuted fracture of shaft of humerus, left arm, subsequent encounter for fracture with delayed healing: Secondary | ICD-10-CM | POA: Diagnosis not present

## 2017-01-26 DIAGNOSIS — I87333 Chronic venous hypertension (idiopathic) with ulcer and inflammation of bilateral lower extremity: Secondary | ICD-10-CM | POA: Diagnosis not present

## 2017-01-26 DIAGNOSIS — M6281 Muscle weakness (generalized): Secondary | ICD-10-CM | POA: Diagnosis not present

## 2017-01-26 DIAGNOSIS — I739 Peripheral vascular disease, unspecified: Secondary | ICD-10-CM | POA: Diagnosis not present

## 2017-01-26 DIAGNOSIS — L97812 Non-pressure chronic ulcer of other part of right lower leg with fat layer exposed: Secondary | ICD-10-CM | POA: Diagnosis not present

## 2017-01-26 DIAGNOSIS — L97822 Non-pressure chronic ulcer of other part of left lower leg with fat layer exposed: Secondary | ICD-10-CM | POA: Diagnosis not present

## 2017-01-26 DIAGNOSIS — I130 Hypertensive heart and chronic kidney disease with heart failure and stage 1 through stage 4 chronic kidney disease, or unspecified chronic kidney disease: Secondary | ICD-10-CM | POA: Diagnosis not present

## 2017-01-27 DIAGNOSIS — F411 Generalized anxiety disorder: Secondary | ICD-10-CM | POA: Diagnosis not present

## 2017-01-28 DIAGNOSIS — L89309 Pressure ulcer of unspecified buttock, unspecified stage: Secondary | ICD-10-CM | POA: Diagnosis not present

## 2017-01-28 DIAGNOSIS — M80022D Age-related osteoporosis with current pathological fracture, left humerus, subsequent encounter for fracture with routine healing: Secondary | ICD-10-CM | POA: Diagnosis not present

## 2017-02-01 DIAGNOSIS — L97822 Non-pressure chronic ulcer of other part of left lower leg with fat layer exposed: Secondary | ICD-10-CM | POA: Diagnosis not present

## 2017-02-01 DIAGNOSIS — M6281 Muscle weakness (generalized): Secondary | ICD-10-CM | POA: Diagnosis not present

## 2017-02-01 DIAGNOSIS — I130 Hypertensive heart and chronic kidney disease with heart failure and stage 1 through stage 4 chronic kidney disease, or unspecified chronic kidney disease: Secondary | ICD-10-CM | POA: Diagnosis not present

## 2017-02-01 DIAGNOSIS — I739 Peripheral vascular disease, unspecified: Secondary | ICD-10-CM | POA: Diagnosis not present

## 2017-02-01 DIAGNOSIS — I87333 Chronic venous hypertension (idiopathic) with ulcer and inflammation of bilateral lower extremity: Secondary | ICD-10-CM | POA: Diagnosis not present

## 2017-02-01 DIAGNOSIS — L97812 Non-pressure chronic ulcer of other part of right lower leg with fat layer exposed: Secondary | ICD-10-CM | POA: Diagnosis not present

## 2017-02-09 DIAGNOSIS — I87333 Chronic venous hypertension (idiopathic) with ulcer and inflammation of bilateral lower extremity: Secondary | ICD-10-CM | POA: Diagnosis not present

## 2017-02-09 DIAGNOSIS — M6281 Muscle weakness (generalized): Secondary | ICD-10-CM | POA: Diagnosis not present

## 2017-02-09 DIAGNOSIS — I739 Peripheral vascular disease, unspecified: Secondary | ICD-10-CM | POA: Diagnosis not present

## 2017-02-09 DIAGNOSIS — L97822 Non-pressure chronic ulcer of other part of left lower leg with fat layer exposed: Secondary | ICD-10-CM | POA: Diagnosis not present

## 2017-02-09 DIAGNOSIS — I130 Hypertensive heart and chronic kidney disease with heart failure and stage 1 through stage 4 chronic kidney disease, or unspecified chronic kidney disease: Secondary | ICD-10-CM | POA: Diagnosis not present

## 2017-02-09 DIAGNOSIS — L97812 Non-pressure chronic ulcer of other part of right lower leg with fat layer exposed: Secondary | ICD-10-CM | POA: Diagnosis not present

## 2017-02-10 DIAGNOSIS — F411 Generalized anxiety disorder: Secondary | ICD-10-CM | POA: Diagnosis not present

## 2017-02-11 ENCOUNTER — Encounter: Payer: Medicare Other | Attending: Surgery | Admitting: Surgery

## 2017-02-11 DIAGNOSIS — G629 Polyneuropathy, unspecified: Secondary | ICD-10-CM | POA: Diagnosis not present

## 2017-02-11 DIAGNOSIS — I509 Heart failure, unspecified: Secondary | ICD-10-CM | POA: Diagnosis not present

## 2017-02-11 DIAGNOSIS — Z79899 Other long term (current) drug therapy: Secondary | ICD-10-CM | POA: Diagnosis not present

## 2017-02-11 DIAGNOSIS — D649 Anemia, unspecified: Secondary | ICD-10-CM | POA: Insufficient documentation

## 2017-02-11 DIAGNOSIS — L97822 Non-pressure chronic ulcer of other part of left lower leg with fat layer exposed: Secondary | ICD-10-CM | POA: Diagnosis not present

## 2017-02-11 DIAGNOSIS — L97522 Non-pressure chronic ulcer of other part of left foot with fat layer exposed: Secondary | ICD-10-CM | POA: Diagnosis not present

## 2017-02-11 DIAGNOSIS — I739 Peripheral vascular disease, unspecified: Secondary | ICD-10-CM | POA: Diagnosis not present

## 2017-02-11 DIAGNOSIS — I2510 Atherosclerotic heart disease of native coronary artery without angina pectoris: Secondary | ICD-10-CM | POA: Diagnosis not present

## 2017-02-11 DIAGNOSIS — L97222 Non-pressure chronic ulcer of left calf with fat layer exposed: Secondary | ICD-10-CM | POA: Insufficient documentation

## 2017-02-11 DIAGNOSIS — I890 Lymphedema, not elsewhere classified: Secondary | ICD-10-CM | POA: Diagnosis not present

## 2017-02-11 DIAGNOSIS — L89322 Pressure ulcer of left buttock, stage 2: Secondary | ICD-10-CM | POA: Insufficient documentation

## 2017-02-11 DIAGNOSIS — K219 Gastro-esophageal reflux disease without esophagitis: Secondary | ICD-10-CM | POA: Insufficient documentation

## 2017-02-11 DIAGNOSIS — E785 Hyperlipidemia, unspecified: Secondary | ICD-10-CM | POA: Diagnosis not present

## 2017-02-11 DIAGNOSIS — M6281 Muscle weakness (generalized): Secondary | ICD-10-CM | POA: Diagnosis not present

## 2017-02-11 DIAGNOSIS — Z885 Allergy status to narcotic agent status: Secondary | ICD-10-CM | POA: Insufficient documentation

## 2017-02-11 DIAGNOSIS — I87333 Chronic venous hypertension (idiopathic) with ulcer and inflammation of bilateral lower extremity: Secondary | ICD-10-CM | POA: Diagnosis not present

## 2017-02-11 DIAGNOSIS — Z7982 Long term (current) use of aspirin: Secondary | ICD-10-CM | POA: Diagnosis not present

## 2017-02-11 DIAGNOSIS — Z8673 Personal history of transient ischemic attack (TIA), and cerebral infarction without residual deficits: Secondary | ICD-10-CM | POA: Diagnosis not present

## 2017-02-11 DIAGNOSIS — I110 Hypertensive heart disease with heart failure: Secondary | ICD-10-CM | POA: Insufficient documentation

## 2017-02-11 DIAGNOSIS — I130 Hypertensive heart and chronic kidney disease with heart failure and stage 1 through stage 4 chronic kidney disease, or unspecified chronic kidney disease: Secondary | ICD-10-CM | POA: Diagnosis not present

## 2017-02-11 DIAGNOSIS — L97812 Non-pressure chronic ulcer of other part of right lower leg with fat layer exposed: Secondary | ICD-10-CM | POA: Diagnosis not present

## 2017-02-12 DIAGNOSIS — S81809A Unspecified open wound, unspecified lower leg, initial encounter: Secondary | ICD-10-CM | POA: Diagnosis not present

## 2017-02-12 DIAGNOSIS — E785 Hyperlipidemia, unspecified: Secondary | ICD-10-CM | POA: Diagnosis not present

## 2017-02-12 DIAGNOSIS — I130 Hypertensive heart and chronic kidney disease with heart failure and stage 1 through stage 4 chronic kidney disease, or unspecified chronic kidney disease: Secondary | ICD-10-CM | POA: Diagnosis not present

## 2017-02-12 DIAGNOSIS — J449 Chronic obstructive pulmonary disease, unspecified: Secondary | ICD-10-CM | POA: Diagnosis not present

## 2017-02-12 NOTE — Progress Notes (Signed)
[name] [name] ([id number]) Visit Report for 02/11/2017 Chief Complaint Document Details Patient Name: [name] [name], [name] [name]. Date of Service: 02/11/2017 8:00 AM Medical Record Number: [id number] Patient Account Number: [id number] Date of Birth/Sex: 08-Jul-1923 (83 y.o. Female) Treating RN: Carolyne Fiscal, Debi Primary Care Provider: Celedonio Miyamoto Other Clinician: Referring Provider: Celedonio Miyamoto Treating Provider/Extender: Frann Rider in Treatment: 0 Information Obtained from: Patient Chief Complaint Patient is at the clinic for treatment of an open pressure ulcer to her left gluteal area and other wounds on her left lower leg and foot, for 3 weeks now Electronic Signature(s) Signed: 02/11/2017 9:53:33 AM By: Christin Fudge MD, FACS Entered By: Christin Fudge on 02/11/2017 09:53:33 [name] [name] ([id number]) -------------------------------------------------------------------------------- HPI Details Patient Name: [name] [name] Date of Service: 02/11/2017 8:00 AM Medical Record Number: [id number] Patient Account Number: [id number] Date of Birth/Sex: 1923/09/06 (83 y.o. Female) Treating RN: Carolyne Fiscal, Debi Primary Care Provider: Celedonio Miyamoto Other Clinician: Referring Provider: Celedonio Miyamoto Treating Provider/Extender: Frann Rider in Treatment: 0 History of Present Illness Location: left gluteal area, left foot, left lower extremity Quality: Patient reports experiencing a sharp pain to affected area(s). Severity: Patient states wound are getting worse. Duration: Patient has had the wound for < 3 weeks prior to presenting for treatment Timing: Pain in wound is Intermittent (comes and goes Context: The wound appeared gradually over time Modifying Factors: Other treatment(s) tried include:local care with Unna boots Associated Signs and Symptoms: Patient reports having increase swelling. HPI Description: 83 year old patient referred to as by her nurse sent [facility] for ulcers both legs,decubitus ulcer on the sacrum which she's had for about 3 weeks. The daughter is at the bedside does not know how these occurred and there was no fall or trauma Her past medical history is suggestive of chronic diastolic heart failure, hypertension, peripheral arterial disease, chronic venous insufficiency, generalized anxiety disorder, and has never been a smoker. She is also status post appendectomy, cholecystectomy, hysterectomy and left hip replacement. She is also had a fracture of her left humerus in November 2017 and has been treated nonsurgically with a Sarmiento brace. Electronic Signature(s) Signed: 02/11/2017 9:55:28 AM By: Christin Fudge MD, FACS Previous Signature: 02/11/2017 8:38:40 AM Version By: Christin Fudge MD, FACS Entered By: Christin Fudge on 02/11/2017 09:55:27 [name] [name] ([id number]) -------------------------------------------------------------------------------- Physical Exam Details Patient Name: [name] [name], [name] [name]. Date of Service: 02/11/2017 8:00 AM Medical Record Number: [id number] Patient Account Number: [id number] Date of Birth/Sex: 1923-10-09 (83 y.o. Female) Treating RN: Carolyne Fiscal, Debi Primary Care Provider: Celedonio Miyamoto Other Clinician: Referring Provider: Celedonio Miyamoto Treating Provider/Extender: Frann Rider in Treatment: 0 Constitutional . Pulse regular. Respirations normal and unlabored. Afebrile. . Eyes Nonicteric. Reactive to light. Ears, Nose, Mouth, and Throat Lips, teeth, and gums WNL.Marland Kitchen Moist mucosa without lesions. Neck supple and nontender. No palpable supraclavicular or cervical adenopathy. Normal sized without goiter. Respiratory WNL. No retractions.. Cardiovascular Pedal Pulses WNL. ABI was noncompressible bilaterally. No clubbing, cyanosis but has significant stage I lymphedema both lower extremities. Chest Breasts symmetical and no nipple discharge.. Breast tissue WNL, no masses, lumps, or  tenderness.. Gastrointestinal (GI) Abdomen without masses or tenderness.. No liver or spleen enlargement or tenderness.. Lymphatic No adneopathy. No adenopathy. No adenopathy. Musculoskeletal Adexa without tenderness or enlargement.. Digits and nails w/o clubbing, cyanosis, infection, petechiae, ischemia, or inflammatory conditions.. Integumentary (Hair, Skin) No suspicious lesions. No crepitus or fluctuance. No peri-wound warmth or erythema. No masses.Marland Kitchen Psychiatric Judgement and insight Intact.. No evidence of depression, anxiety, or agitation.. Notes  the patient has a stage II pressure ulcer left gluteal area and on the sacrum there is no evidence of any ulceration. No debridement was required at this time. The wound on the dorsum of her left foot has some slough and will need chemical debridement with Santyl The areas on her left lower extremity fairly superficial some down to subcutaneous tissue and we will treat these with silver alginate and a light Kerlix and Coban wrap Electronic Signature(s) Signed: 02/11/2017 9:57:18 AM By: Christin Fudge MD, FACS [name] [name], [name] [name] ([id number]) Entered By: Christin Fudge on 02/11/2017 09:57:17 [name] [name] ([id number]) -------------------------------------------------------------------------------- Physician Orders Details Patient Name: [name] [name] Date of Service: 02/11/2017 8:00 AM Medical Record Number: [id number] Patient Account Number: [id number] Date of Birth/Sex: 24-Feb-1923 (83 y.o. Female) Treating RN: Carolyne Fiscal, Debi Primary Care Provider: Celedonio Miyamoto Other Clinician: Referring Provider: Celedonio Miyamoto Treating Provider/Extender: Frann Rider in Treatment: 0 Verbal / Phone Orders: Yes Clinician: [place], Debi Read Back and Verified: Yes Diagnosis Coding Wound Cleansing Wound #1 Left,Dorsal Foot o Clean wound with Normal Saline. o Cleanse wound with mild soap and water Wound #2 Left,Proximal,Anterior Lower  Leg o Clean wound with Normal Saline. o Cleanse wound with mild soap and water Wound #3 Left,Lateral Lower Leg o Clean wound with Normal Saline. o Cleanse wound with mild soap and water Wound #4 Left Gluteus o Clean wound with Normal Saline. o Cleanse wound with mild soap and water Anesthetic Wound #1 Left,Dorsal Foot o Topical Lidocaine 4% cream applied to wound bed prior to debridement - for clinic use Wound #2 Left,Proximal,Anterior Lower Leg o Topical Lidocaine 4% cream applied to wound bed prior to debridement - for clinic use Wound #3 Left,Lateral Lower Leg o Topical Lidocaine 4% cream applied to wound bed prior to debridement - for clinic use Wound #4 Left Gluteus o Topical Lidocaine 4% cream applied to wound bed prior to debridement - for clinic use Skin Barriers/Peri-Wound Care Wound #4 Left Gluteus o Skin Prep Primary Wound Dressing Wound #1 Left,Dorsal Foot o Santyl Ointment [name] [name], [name] [name] ([id number]) Wound #2 Left,Proximal,Anterior Lower Leg o Aquacel Ag - or equivalent to Wound #3 Left,Lateral Lower Leg o Aquacel Ag - or equivalent to Wound #4 Left Gluteus o Aquacel Ag - or equivalent to Secondary Dressing Wound #1 Left,Dorsal Foot o ABD pad o Dry Gauze Wound #2 Left,Proximal,Anterior Lower Leg o ABD pad o Dry Gauze Wound #3 Left,Lateral Lower Leg o ABD pad o Dry Gauze Wound #4 Left Gluteus o Boardered Foam Dressing Dressing Change Frequency Wound #1 Left,Dorsal Foot o Change dressing every other day. Wound #2 Left,Proximal,Anterior Lower Leg o Change dressing every other day. Wound #3 Left,Lateral Lower Leg o Change dressing every other day. Wound #4 Left Gluteus o Change dressing every other day. Follow-up Appointments Wound #1 Left,Dorsal Foot o Return Appointment in 1 week. Wound #2 Left,Proximal,Anterior Lower Leg o Return Appointment in 1 week. Wound #3 Left,Lateral Lower Leg o  Return Appointment in 1 week. [name] [name], [name] [name] ([id number]) Wound #4 Left Gluteus o Return Appointment in 1 week. Edema Control Wound #1 Left,Dorsal Foot o Kerlix and Coban - Left Lower Extremity o Elevate legs to the level of the heart and pump ankles as often as possible Wound #2 Left,Proximal,Anterior Lower Leg o Kerlix and Coban - Left Lower Extremity o Elevate legs to the level of the heart and pump ankles as often as possible Wound #3 Left,Lateral Lower Leg o Kerlix and Coban - Left Lower Extremity o Elevate  legs to the level of the heart and pump ankles as often as possible Wound #4 Left Gluteus o Kerlix and Coban - Left Lower Extremity o Elevate legs to the level of the heart and pump ankles as often as possible Off-Loading Wound #1 Left,Dorsal Foot o Turn and reposition every 2 hours Wound #2 Left,Proximal,Anterior Lower Leg o Turn and reposition every 2 hours Wound #3 Left,Lateral Lower Leg o Turn and reposition every 2 hours Wound #4 Left Gluteus o Turn and reposition every 2 hours Additional Orders / Instructions Wound #1 Left,Dorsal Foot o Increase protein intake. Wound #2 Left,Proximal,Anterior Lower Leg o Increase protein intake. Wound #3 Left,Lateral Lower Leg o Increase protein intake. Wound #4 Left Gluteus o Increase protein intake. [place], [place] ([id number]) Wound #1 [place] Nurse may visit PRN to address patientos wound care needs. o FACE TO FACE ENCOUNTER: MEDICARE and MEDICAID PATIENTS: I certify that this patient is under my care and that I had a face-to-face encounter that meets the physician face-to-face encounter requirements with this patient on this date. The encounter with the patient was in whole or in part for the following MEDICAL CONDITION: (primary reason for [place]) MEDICAL NECESSITY: I certify, that based on my  findings, NURSING services are a medically necessary home health service. HOME BOUND STATUS: I certify that my clinical findings support that this patient is homebound (i.e., Due to illness or injury, pt requires aid of supportive devices such as crutches, cane, wheelchairs, walkers, the use of special transportation or the assistance of another person to leave their place of residence. There is a normal inability to leave the home and doing so requires considerable and taxing effort. Other absences are for medical reasons / religious services and are infrequent or of short duration when for other reasons). o If current dressing causes regression in wound condition, may D/C ordered dressing product/s and apply Normal Saline Moist Dressing daily until next [place] / Other MD appointment. [place] of regression in wound condition at [phone]. o Please direct any NON-WOUND related issues/requests for orders to patient's Primary Care Physician Wound #2 Left,Proximal,Anterior Lower Leg o [place] Nurse may visit PRN to address patientos wound care needs. o FACE TO FACE ENCOUNTER: MEDICARE and MEDICAID PATIENTS: I certify that this patient is under my care and that I had a face-to-face encounter that meets the physician face-to-face encounter requirements with this patient on this date. The encounter with the patient was in whole or in part for the following MEDICAL CONDITION: (primary reason for [place]) MEDICAL NECESSITY: I certify, that based on my findings, NURSING services are a medically necessary home health service. HOME BOUND STATUS: I certify that my clinical findings support that this patient is homebound (i.e., Due to illness or injury, pt requires aid of supportive devices such as crutches, cane, wheelchairs, walkers, the use of special transportation or the assistance of another person  to leave their place of residence. There is a normal inability to leave the home and doing so requires considerable and taxing effort. Other absences are for medical reasons / religious services and are infrequent or of short duration when for other reasons). o If current dressing causes regression in wound condition, may D/C ordered dressing product/s and apply Normal Saline Moist Dressing daily until next [place] / Other MD appointment. [facility] of regression in wound condition at [phone]. o Please direct any NON-WOUND related issues/requests for orders to patient's Primary Care Physician Wound #3 Left,Lateral Lower Leg o [place] Nurse may visit PRN to address patientos wound care needs. o FACE TO FACE ENCOUNTER: MEDICARE and MEDICAID PATIENTS: I certify that this patient is under my care and that I had a face-to-face encounter that meets the physician face-to-face encounter requirements with this patient on this date. The encounter with the patient was in whole or in part for the following MEDICAL CONDITION: (primary reason for [place]) MEDICAL NECESSITY: I certify, that based on my findings, NURSING services are a medically [name] [name], [name] [name] ([id number]) necessary home health service. HOME BOUND STATUS: I certify that my clinical findings support that this patient is homebound (i.e., Due to illness or injury, pt requires aid of supportive devices such as crutches, cane, wheelchairs, walkers, the use of special transportation or the assistance of another person to leave their place of residence. There is a normal inability to leave the home and doing so requires considerable and taxing effort. Other absences are for medical reasons / religious services and are infrequent or of short duration when for other reasons). o If current dressing causes regression in wound condition, may D/C  ordered dressing product/s and apply Normal Saline Moist Dressing daily until next [place] / Other MD appointment. [place] of regression in wound condition at [phone]. o Please direct any NON-WOUND related issues/requests for orders to patient's Primary Care Physician Wound #4 Left [place] Nurse may visit PRN to address patientos wound care needs. o FACE TO FACE ENCOUNTER: MEDICARE and MEDICAID PATIENTS: I certify that this patient is under my care and that I had a face-to-face encounter that meets the physician face-to-face encounter requirements with this patient on this date. The encounter with the patient was in whole or in part for the following MEDICAL CONDITION: (primary reason for [place]) MEDICAL NECESSITY: I certify, that based on my findings, NURSING services are a medically necessary home health service. HOME BOUND STATUS: I certify that my clinical findings support that this patient is homebound (i.e., Due to illness or injury, pt requires aid of supportive devices such as crutches, cane, wheelchairs, walkers, the use of special transportation or the assistance of another person to leave their place of residence. There is a normal inability to leave the home and doing so requires considerable and taxing effort. Other absences are for medical reasons / religious services and are infrequent or of short duration when for other reasons). o If current dressing causes regression in wound condition, may D/C ordered dressing product/s and apply Normal Saline Moist Dressing daily until next [place] / Other MD appointment. [place] of regression in wound condition at [phone]. o Please direct any NON-WOUND related issues/requests for orders to patient's Primary Care Physician Medications-please add to medication list. Wound #1 Left,Dorsal  Foot o Santyl Enzymatic Ointment o Other: - Vitamin C, Zinc, MVI Wound #2 Left,Proximal,Anterior Lower Leg o Santyl Enzymatic Ointment o Other: - Vitamin C, Zinc, MVI Wound #3 Left,Lateral Lower Leg o Santyl Enzymatic Ointment o Other: - Vitamin C, Zinc, MVI Wound #4 Left Gluteus o Santyl Enzymatic Ointment [name] [name], [name] [name] ([id number]) o Other: - Vitamin C, Zinc, MVI Patient Medications Allergies: morphine, Zoloft, cholesterol medications Notifications Medication Indication Start End Santyl 02/11/2017  DOSE topical 250 unit/gram ointment - ointment topical as directed Electronic Signature(s) Signed: 02/11/2017 9:58:39 AM By: Christin Fudge MD, FACS Entered By: Christin Fudge on 02/11/2017 09:58:37 [name] [name] ([id number]) -------------------------------------------------------------------------------- Problem List Details Patient Name: [name] [name], PARDUE. Date of Service: 02/11/2017 8:00 AM Medical Record Number: [id number] Patient Account Number: [id number] Date of Birth/Sex: [birth year] (83 y.o. Female) Treating RN: Carolyne Fiscal, Debi Primary Care Provider: Celedonio Miyamoto Other Clinician: Referring Provider: Celedonio Miyamoto Treating Provider/Extender: Frann Rider in Treatment: 0 Active Problems ICD-10 Encounter Code Description Active Date Diagnosis L89.322 Pressure ulcer of left buttock, stage 2 02/11/2017 Yes L97.222 Non-pressure chronic ulcer of left calf with fat layer 02/11/2017 Yes exposed L97.522 Non-pressure chronic ulcer of other part of left foot with fat 02/11/2017 Yes layer exposed I73.9 Peripheral vascular disease, unspecified 02/11/2017 Yes Inactive Problems Resolved Problems Electronic Signature(s) Signed: 02/11/2017 9:52:41 AM By: Christin Fudge MD, FACS Entered By: Christin Fudge on 02/11/2017 09:52:41 [name] [name] ([id number]) -------------------------------------------------------------------------------- Progress Note Details Patient  Name: [name] [name] Date of Service: 02/11/2017 8:00 AM Medical Record Number: [id number] Patient Account Number: [id number] Date of Birth/Sex: 1923/08/29 (83 y.o. Female) Treating RN: Carolyne Fiscal, Debi Primary Care Provider: Celedonio Miyamoto Other Clinician: Referring Provider: Celedonio Miyamoto Treating Provider/Extender: Frann Rider in Treatment: 0 Subjective Chief Complaint Information obtained from Patient Patient is at the clinic for treatment of an open pressure ulcer to her left gluteal area and other wounds on her left lower leg and foot, for 3 weeks now History of Present Illness (HPI) The following HPI elements were documented for the patient's wound: Location: left gluteal area, left foot, left lower extremity Quality: Patient reports experiencing a sharp pain to affected area(s). Severity: Patient states wound are getting worse. Duration: Patient has had the wound for < 3 weeks prior to presenting for treatment Timing: Pain in wound is Intermittent (comes and goes Context: The wound appeared gradually over time Modifying Factors: Other treatment(s) tried include:local care with Unna boots Associated Signs and Symptoms: Patient reports having increase swelling. 83 year old patient referred to as by her nurse sent [facility] for ulcers both legs,decubitus ulcer on the sacrum which she's had for about 3 weeks. The daughter is at the bedside does not know how these occurred and there was no fall or trauma Her past medical history is suggestive of chronic diastolic heart failure, hypertension, peripheral arterial disease, chronic venous insufficiency, generalized anxiety disorder, and has never been a smoker. She is also status post appendectomy, cholecystectomy, hysterectomy and left hip replacement. She is also had a fracture of her left humerus in November 2017 and has been treated nonsurgically with a Sarmiento brace. Wound History Patient presents with 4  open wounds that have been present for approximately 4 days. Patient has been treating wounds in the following manner: bandages. Laboratory tests have not been performed in the last month. Patient reportedly has not tested positive for an antibiotic resistant organism. Patient reportedly has not tested positive for osteomyelitis. Patient reportedly has not had testing performed to evaluate circulation in the legs. Patient History Information obtained from Patient. Allergies morphine, Zoloft, cholesterol medications [name] [name], [name] [name] ([id number]) Family History Cancer - Siblings. Social History Never smoker, Marital Status - Widowed, Alcohol Use - Never, Drug Use - No History, Caffeine Use - Daily. Medical History Hematologic/Lymphatic Patient has history of Anemia Cardiovascular Patient has history of Congestive Heart Failure, Coronary Artery Disease, Hypertension, Peripheral Venous Disease Neurologic Patient has history of Neuropathy Review of Systems (ROS) Constitutional  Symptoms (General Health) The patient has no complaints or symptoms. Eyes Complains or has symptoms of Glasses / Contacts. Ear/Nose/Mouth/Throat The patient has no complaints or symptoms. Respiratory Complains or has symptoms of Shortness of Breath, emphysema wears oxygen Cardiovascular hyperlipidemia stroke Gastrointestinal gerd Genitourinary renal disorder Immunological The patient has no complaints or symptoms. Integumentary (Skin) Complains or has symptoms of Wounds. Musculoskeletal osteoporosis Oncologic The patient has no complaints or symptoms. Psychiatric Complains or has symptoms of Anxiety. Medications oxygen nasal 2 LPM 24 hours daily Coreg 12.5 mg tablet oral 1 1 tablet oral two times daily Mapap (acetaminophen) 325 mg tablet oral 2 2 tablets oral every six hours as needed tramadol 50 mg tablet oral 1 1 tablet oral every four hours as needed [name] [name], LAVER[place]NE. ([id number]) alprazolam 0.25  mg tablet oral 1 1 tablet oral two times daily as needed dicyclomine 20 mg tablet oral 1 1 tablet oral three times daily as needed gabapentin 300 mg capsule oral 1 1 capsule oral three times daily Align 4 mg capsule oral 1 1 capsule oral daily ondansetron 4 mg disintegrating tablet oral 1 1 tablet,disintegrating oral every eight hours as needed loratadine 10 mg tablet oral 1 1 tablet oral daily Boost 0.04 gram-1 kcal/mL oral liquid oral 1 1 liquid oral daily Zantac 150 mg tablet oral 1 1 tablet oral two times daily Miralax 17 gram oral powder packet oral 1 1 powder in packet oral mixed with water daily Senokot-S 8.6 mg-50 mg tablet oral 2 2 tablets oral daily Lasix 20 mg tablet oral 1 1 tablet oral every other day [place] Nasal 0.65 % spray aerosol nasal aerosol,spray nasal two times daily as needed celecoxib 100 mg capsule oral 1 1 capsule oral every other day as needed aspirin 81 mg chewable tablet oral 1 1 tablet,chewable oral daily clopidogrel 75 mg tablet oral 1 1 tablet oral daily potassium chloride ER 10 mEq tablet,extended release oral 1 1 tablet extended release oral daily Triple Antibiotic 3.5 mg-400 unit-5,000 unit topical ointment in packt topical ointment in packet topical daily Flector 1.3 % transdermal 12 hour patch transdermal 1 1 patch 12 hour transdermal Santyl 250 unit/gram topical ointment topical ointment topical as directed nitroglycerin 0.2 mg/hr transdermal 24 hour patch transdermal 1 1 patch 24 hour transdermal daily cyanocobalamin (vit B-12) 1,000 mcg tablet oral 1 1 tablet oral daily Vitamin D3 2,000 unit capsule oral 2 2 capsules oral daily Objective Constitutional Pulse regular. Respirations normal and unlabored. Afebrile. Vitals Time Taken: 8:30 AM, Height: 66 in, Source: Stated, Weight: 144 lbs, Source: Measured, BMI: 23.2, Temperature: 97.7 F, Pulse: 69 bpm, Respiratory Rate: 16 breaths/min, Blood Pressure: 131/46 mmHg. Eyes Nonicteric. Reactive to  light. Ears, Nose, Mouth, and Throat Lips, teeth, and gums WNL.Marland Kitchen Moist mucosa without lesions. Neck supple and nontender. No palpable supraclavicular or cervical adenopathy. Normal sized without goiter. Respiratory WNL. No retractions.. Cardiovascular Pedal Pulses WNL. ABI was noncompressible bilaterally. No clubbing, cyanosis but has significant stage I Biller, Sujata O. ([id number]) lymphedema both lower extremities. Chest Breasts symmetical and no nipple discharge.. Breast tissue WNL, no masses, lumps, or tenderness.. Gastrointestinal (GI) Abdomen without masses or tenderness.. No liver or spleen enlargement or tenderness.. Lymphatic No adneopathy. No adenopathy. No adenopathy. Musculoskeletal Adexa without tenderness or enlargement.. Digits and nails w/o clubbing, cyanosis, infection, petechiae, ischemia, or inflammatory conditions.Marland Kitchen Psychiatric Judgement and insight Intact.. No evidence of depression, anxiety, or agitation.. General Notes: the patient has a stage II pressure ulcer left gluteal area and on the  sacrum there is no evidence of any ulceration. No debridement was required at this time. The wound on the dorsum of her left foot has some slough and will need chemical debridement with Santyl The areas on her left lower extremity fairly superficial some down to subcutaneous tissue and we will treat these with silver alginate and a light Kerlix and Coban wrap Integumentary (Hair, Skin) No suspicious lesions. No crepitus or fluctuance. No peri-wound warmth or erythema. No masses.. Wound #1 status is Open. Original cause of wound was Gradually Appeared. The wound is located on the Left,Dorsal Foot. The wound measures 1.5cm length x 1.5cm width x 0.1cm depth; 1.767cm^2 area and 0.177cm^3 volume. There is no tunneling or undermining noted. There is a large amount of serosanguineous drainage noted. The wound margin is distinct with the outline attached to the wound base. There is  medium (34-66%) red granulation within the wound bed. There is a medium (34-66%) amount of necrotic tissue within the wound bed including Eschar and Adherent Slough. The periwound skin appearance exhibited: Maceration. Periwound temperature was noted as No Abnormality. The periwound has tenderness on palpation. Wound #2 status is Open. Original cause of wound was Gradually Appeared. The wound is located on the Left,Proximal,Anterior Lower Leg. The wound measures 10cm length x 3.5cm width x 0.1cm depth; 27.489cm^2 area and 2.749cm^3 volume. There is no tunneling or undermining noted. There is a large amount of serosanguineous drainage noted. The wound margin is distinct with the outline attached to the wound base. There is large (67-100%) red granulation within the wound bed. There is no necrotic tissue within the wound bed. Periwound temperature was noted as No Abnormality. The periwound has tenderness on palpation. Wound #3 status is Open. Original cause of wound was Gradually Appeared. The wound is located on the Left,Lateral Lower Leg. The wound measures 9.5cm length x 4cm width x 0.1cm depth; 29.845cm^2 area and 2.985cm^3 volume. There is no tunneling or undermining noted. There is a large amount of serosanguineous drainage noted. The wound margin is distinct with the outline attached to the wound base. There is large (67-100%) red granulation within the wound bed. There is a small (1-33%) amount of necrotic tissue within the wound bed including Eschar. The periwound skin appearance exhibited: Maceration. [name] [name], [name] [name] ([id number]) Periwound temperature was noted as No Abnormality. The periwound has tenderness on palpation. Wound #4 status is Open. Original cause of wound was Pressure Injury. The wound is located on the Left Gluteus. The wound measures 1.2cm length x 0.3cm width x 0.1cm depth; 0.283cm^2 area and 0.028cm^3 volume. There is no tunneling or undermining noted. There is a  medium amount of serosanguineous drainage noted. The wound margin is distinct with the outline attached to the wound base. There is large (67-100%) red granulation within the wound bed. There is a small (1-33%) amount of necrotic tissue within the wound bed including Eschar. Periwound temperature was noted as No Abnormality. The periwound has tenderness on palpation. 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 The patient and the daughter do not want extensive investigations done specially arterial or venous  studies. The emphasis is more on comfort care. After review and have recommended: 1. Silver alginate and a bordered foam to the left gluteal area 2. Silver alginate to the left lower extremity and central ointment to the dorsum of the left foot. A light Kerlix and Coban dressing to be applied 3. Elevation and exercise 4. Offloading has been discussed in great detail 5. Adequate protein, vitamin A, vitamin C and zinc 6. Regular visits to the wound center Plan Wound Cleansing: Wound #1 Left,Dorsal Foot: Clean wound with Normal Saline. Cleanse wound with mild soap and water Wound #2 Left,Proximal,Anterior Lower Leg: Clean wound with Normal Saline. Cleanse wound with mild soap and water F[place]man, [place] ([id number]) Wound #3 Left,Lateral Lower Leg: Clean wound with Normal Saline. Cleanse wound with mild soap and water Wound #4 Left Gluteus: Clean wound with Normal Saline. Cleanse wound with mild soap and water Anesthetic: Wound #1 Left,Dorsal Foot: Topical Lidocaine  4% cream applied to wound bed prior to debridement - for clinic use Wound #2 Left,Proximal,Anterior Lower Leg: Topical Lidocaine 4% cream applied to wound bed prior to debridement - for clinic use Wound #3 Left,Lateral Lower Leg: Topical Lidocaine 4% cream applied to wound bed prior to debridement - for clinic use Wound #4 Left Gluteus: Topical Lidocaine 4% cream applied to wound bed prior to debridement - for clinic use Skin Barriers/Peri-Wound Care: Wound #4 Left Gluteus: Skin Prep Primary Wound Dressing: Wound #1 Left,Dorsal Foot: Santyl Ointment Wound #2 Left,Proximal,Anterior Lower Leg: Aquacel Ag - or equivalent to Wound #3 Left,Lateral Lower Leg: Aquacel Ag - or equivalent to Wound #4 Left Gluteus: Aquacel Ag - or equivalent to Secondary Dressing: Wound #1 Left,Dorsal Foot: ABD pad Dry Gauze Wound #2 Left,Proximal,Anterior Lower Leg: ABD pad Dry Gauze Wound #3 Left,Lateral Lower Leg: ABD pad Dry Gauze Wound #4 Left Gluteus: Boardered Foam Dressing Dressing Change Frequency: Wound #1 Left,Dorsal Foot: Change dressing every other day. Wound #2 Left,Proximal,Anterior Lower Leg: Change dressing every other day. Wound #3 Left,Lateral Lower Leg: Change dressing every other day. Wound #4 Left Gluteus: Change dressing every other day. Follow-up Appointments: Wound #1 Left,Dorsal Foot: Return Appointment in 1 week. [name] [name], [name] [name] ([id number]) Wound #2 Left,Proximal,Anterior Lower Leg: Return Appointment in 1 week. Wound #3 Left,Lateral Lower Leg: Return Appointment in 1 week. Wound #4 Left Gluteus: Return Appointment in 1 week. Edema Control: Wound #1 Left,Dorsal Foot: Kerlix and Coban - Left Lower Extremity Elevate legs to the level of the heart and pump ankles as often as possible Wound #2 Left,Proximal,Anterior Lower Leg: Kerlix and Coban - Left Lower Extremity Elevate legs to the level of the heart and pump ankles as often as possible Wound #3 Left,Lateral  Lower Leg: Kerlix and Coban - Left Lower Extremity Elevate legs to the level of the heart and pump ankles as often as possible Wound #4 Left Gluteus: Kerlix and Coban - Left Lower Extremity Elevate legs to the level of the heart and pump ankles as often as possible Off-Loading: Wound #1 Left,Dorsal Foot: Turn and reposition every 2 hours Wound #2 Left,Proximal,Anterior Lower Leg: Turn and reposition every 2 hours Wound #3 Left,Lateral Lower Leg: Turn and reposition every 2 hours Wound #4 Left Gluteus: Turn and reposition every 2 hours Additional Orders / Instructions: Wound #1 Left,Dorsal Foot: Increase protein intake. Wound #2 Left,Proximal,Anterior Lower Leg: Increase protein intake. Wound #3 Left,Lateral Lower Leg: Increase protein intake. Wound #4 Left Gluteus: Increase protein intake. Home Health: Wound #1 Left,Dorsal Foot: Continue Home Health Visits -  [facility] Nurse may visit PRN to address patient s wound care needs. FACE TO FACE ENCOUNTER: MEDICARE and MEDICAID PATIENTS: I certify that this patient is under my care and that I had a face-to-face encounter that meets the physician face-to-face encounter requirements with this patient on this date. The encounter with the patient was in whole or in part for the following MEDICAL CONDITION: (primary reason for [place]) MEDICAL NECESSITY: I certify, that based on my findings, NURSING services are a medically necessary home health service. HOME BOUND STATUS: I certify that my clinical findings support that this patient is homebound (i.e., Due to illness or injury, pt requires aid of supportive devices such as crutches, cane, wheelchairs, walkers, the use of special transportation or the assistance of another person to leave their place of residence. There is a normal inability to leave the home and doing so requires considerable and taxing effort. Other absences are for medical reasons / religious services  and are infrequent or of short duration when for other reasons). LAURAJEAN, HOSEK ([id number]) If current dressing causes regression in wound condition, may D/C ordered dressing product/s and apply Normal Saline Moist Dressing daily until next [place] / Other MD appointment. [place] of regression in wound condition at [phone]. Please direct any NON-WOUND related issues/requests for orders to patient's Primary Care Physician Wound #2 Left,Proximal,Anterior Lower Leg: [place] Nurse may visit PRN to address patient s wound care needs. FACE TO FACE ENCOUNTER: MEDICARE and MEDICAID PATIENTS: I certify that this patient is under my care and that I had a face-to-face encounter that meets the physician face-to-face encounter requirements with this patient on this date. The encounter with the patient was in whole or in part for the following MEDICAL CONDITION: (primary reason for [place]) MEDICAL NECESSITY: I certify, that based on my findings, NURSING services are a medically necessary home health service. HOME BOUND STATUS: I certify that my clinical findings support that this patient is homebound (i.e., Due to illness or injury, pt requires aid of supportive devices such as crutches, cane, wheelchairs, walkers, the use of special transportation or the assistance of another person to leave their place of residence. There is a normal inability to leave the home and doing so requires considerable and taxing effort. Other absences are for medical reasons / religious services and are infrequent or of short duration when for other reasons). If current dressing causes regression in wound condition, may D/C ordered dressing product/s and apply Normal Saline Moist Dressing daily until next [place] / Other MD appointment. [place] of regression in wound condition at [phone]. Please  direct any NON-WOUND related issues/requests for orders to patient's Primary Care Physician Wound #3 Left,Lateral Lower Leg: [place] Nurse may visit PRN to address patient s wound care needs. FACE TO FACE ENCOUNTER: MEDICARE and MEDICAID PATIENTS: I certify that this patient is under my care and that I had a face-to-face encounter that meets the physician face-to-face encounter requirements with this patient on this date. The encounter with the patient was in whole or in part for the following MEDICAL CONDITION: (primary reason for [place]) MEDICAL NECESSITY: I certify, that based on my findings, NURSING services are a medically necessary home health service. HOME BOUND STATUS: I certify that my clinical findings support that this patient is homebound (i.e., Due to illness or injury, pt requires aid  of supportive devices such as crutches, cane, wheelchairs, walkers, the use of special transportation or the assistance of another person to leave their place of residence. There is a normal inability to leave the home and doing so requires considerable and taxing effort. Other absences are for medical reasons / religious services and are infrequent or of short duration when for other reasons). If current dressing causes regression in wound condition, may D/C ordered dressing product/s and apply Normal Saline Moist Dressing daily until next [place] / Other MD appointment. [place] of regression in wound condition at [phone]. Please direct any NON-WOUND related issues/requests for orders to patient's Primary Care Physician Wound #4 Left Gluteus: [place] Nurse may visit PRN to address patient s wound care needs. FACE TO FACE ENCOUNTER: MEDICARE and MEDICAID PATIENTS: I certify that this patient is under my care and that I had a face-to-face encounter that meets the  physician face-to-face encounter requirements with this patient on this date. The encounter with the patient was in whole or in part for the following MEDICAL CONDITION: (primary reason for [place]) MEDICAL NECESSITY: I certify, that based on my findings, NURSING services are a medically necessary home health service. HOME BOUND STATUS: I certify that my clinical findings support that this patient is homebound (i.e., Due to illness or injury, pt requires aid of supportive devices such as crutches, cane, wheelchairs, walkers, the use of special transportation or the assistance of another person to leave their place of residence. There is a normal inability to leave the home and doing so requires considerable and taxing effort. Other absences are for medical reasons / religious services and are infrequent or of short duration when for other reasons). MADI, BONFIGLIO ([id number]) If current dressing causes regression in wound condition, may D/C ordered dressing product/s and apply Normal Saline Moist Dressing daily until next [place] / Other MD appointment. [place] of regression in wound condition at [phone]. Please direct any NON-WOUND related issues/requests for orders to patient's Primary Care Physician Medications-please add to medication list.: Wound #1 Left,Dorsal Foot: Santyl Enzymatic Ointment Other: - Vitamin C, Zinc, MVI Wound #2 Left,Proximal,Anterior Lower Leg: Santyl Enzymatic Ointment Other: - Vitamin C, Zinc, MVI Wound #3 Left,Lateral Lower Leg: Santyl Enzymatic Ointment Other: - Vitamin C, Zinc, MVI Wound #4 Left Gluteus: Santyl Enzymatic Ointment Other: - Vitamin C, Zinc, MVI The following medication(s) was prescribed: Santyl topical 250 unit/gram ointment ointment topical as directed starting 02/11/2017 this 83 year old patient who is here with multiple wounds some due to pressure and others possibly due to trauma has her  daughter at the bedside was a part of attorney. The patient and the daughter do not want extensive investigations done specially arterial or venous studies. The emphasis is more on comfort care. After review and have recommended: 1. Silver alginate and a bordered foam to the left gluteal area 2. Silver alginate to the left lower extremity and central ointment to the dorsum of the left foot. A light Kerlix and Coban dressing to be applied 3. Elevation and exercise 4. Offloading has been discussed in great detail 5. Adequate protein, vitamin A, vitamin C and zinc 6. Regular visits to the wound center Electronic Signature(s) Signed: 02/11/2017 10:00:44 AM By: Christin Fudge MD, FACS Entered By: Christin Fudge on 02/11/2017 10:00:44 [name] [name] ([id number]) -------------------------------------------------------------------------------- ROS/PFSH Details Patient Name: [name] [name] Date of Service: 02/11/2017 8:00 AM Medical Record Number:  [id number] Patient Account Number: [id number] Date of Birth/Sex: March 25, 1923 (83 y.o. Female) Treating RN: Carolyne Fiscal, Debi Primary Care Provider: Celedonio Miyamoto Other Clinician: Referring Provider: Celedonio Miyamoto Treating Provider/Extender: Frann Rider in Treatment: 0 Information Obtained From Patient Wound History Do you currently have one or more open woundso Yes How many open wounds do you currently haveo 4 Approximately how long have you had your woundso 4 days How have you been treating your wound(s) until nowo bandages Has your wound(s) ever healed and then re-openedo No Have you had any lab work done in the past montho No Have you tested positive for an antibiotic resistant organism (MRSA, VRE)o No Have you tested positive for osteomyelitis (bone infection)o No Have you had any tests for circulation on your legso No Eyes Complaints and Symptoms: Positive for: Glasses / Contacts Respiratory Complaints and Symptoms: Positive for:  Shortness of Breath Review of System Notes: emphysema wears oxygen Integumentary (Skin) Complaints and Symptoms: Positive for: Wounds Psychiatric Complaints and Symptoms: Positive for: Anxiety Constitutional Symptoms (General Health) Complaints and Symptoms: No Complaints or Symptoms Ear/Nose/Mouth/Throat DESIREE, DAISE ([id number]) Complaints and Symptoms: No Complaints or Symptoms Hematologic/Lymphatic Medical History: Positive for: Anemia Cardiovascular Complaints and Symptoms: Review of System Notes: hyperlipidemia stroke Medical History: Positive for: Congestive Heart Failure; Coronary Artery Disease; Hypertension; Peripheral Venous Disease Gastrointestinal Complaints and Symptoms: Review of System Notes: gerd Genitourinary Complaints and Symptoms: Review of System Notes: renal disorder Immunological Complaints and Symptoms: No Complaints or Symptoms Musculoskeletal Complaints and Symptoms: Review of System Notes: osteoporosis Neurologic Medical History: Positive for: Neuropathy Oncologic Complaints and Symptoms: No Complaints or Symptoms Boltz, Avianah O. ([id number]) Immunizations Pneumococcal Vaccine: Received Pneumococcal Vaccination: Yes Family and Social History Cancer: Yes - Siblings; Never smoker; Marital Status - Widowed; Alcohol Use: Never; Drug Use: No History; Caffeine Use: Daily; Financial Concerns: No; Food, Clothing or Shelter Needs: No; Support System Lacking: No; Transportation Concerns: No; Advanced Directives: No; Patient does not want information on Advanced Directives; Do not resuscitate: No; Living Will: Yes (Not Provided); Medical Power of Attorney: Yes - martha robor (Not Provided) Physician Affirmation I have reviewed and agree with the above information. Electronic Signature(s) Signed: 02/11/2017 4:50:03 PM By: Christin Fudge MD, FACS Signed: 02/11/2017 5:04:43 PM By: Alric Quan Entered By: Christin Fudge on 02/11/2017  09:15:40 [name] [name] ([id number]) -------------------------------------------------------------------------------- SuperBill Details Patient Name: MAKENZYE, TROUTMAN. Date of Service: 02/11/2017 Medical Record Number: [id number] Patient Account Number: [id number] Date of Birth/Sex: 01-15-1923 (83 y.o. Female) Treating RN: Carolyne Fiscal, Debi Primary Care Provider: Celedonio Miyamoto Other Clinician: Referring Provider: Celedonio Miyamoto Treating Provider/Extender: Frann Rider in Treatment: 0 Diagnosis Coding ICD-10 Codes Code Description [phone] Pressure ulcer of left buttock, stage 2 L97.222 Non-pressure chronic ulcer of left calf with fat layer exposed L97.522 Non-pressure chronic ulcer of other part of left foot with fat layer exposed I73.9 Peripheral vascular disease, unspecified Facility Procedures CPT4 Code: [id number] Description: 62694 - WOUND CARE VISIT-LEV 5 NEW PT Modifier: Quantity: 1 Physician Procedures CPT4 Code Description: [id number] - WC PHYS LEVEL 4 - NEW PT ICD-10 Description Diagnosis L89.322 Pressure ulcer of left buttock, stage 2 L97.222 Non-pressure chronic ulcer of left calf with fat lay L97.522 Non-pressure chronic ulcer of other part of  left foo I73.9 Peripheral vascular disease, unspecified Modifier: er exposed t with fat laye Quantity: 1 r exposed Electronic Signature(s) Signed: 02/11/2017 4:50:03 PM By: Christin Fudge MD, FACS Signed: 02/11/2017 5:04:43 PM By: Alric Quan Previous Signature: 02/11/2017 10:01:05 AM Version By: Christin Fudge  MD, FACS Entered By: Alric Quan on 02/11/2017 16:01:32

## 2017-02-12 NOTE — Progress Notes (Signed)
[name] [name], [name] [name] ([id number]) Visit Report for 02/11/2017 Abuse/Suicide Risk Screen Details Patient Name: [name] [name], [name] [name]. Date of Service: 02/11/2017 8:00 AM Medical Record Number: [id number] Patient Account Number: [id number] Date of Birth/Sex: 07-14-1923 (83 y.o. Female) Treating RN: Carolyne Fiscal, Debi Primary Care Ayza Ripoll: Celedonio Miyamoto Other Clinician: Referring Zeth Buday: Celedonio Miyamoto Treating Bartlett Enke/Extender: Frann Rider in Treatment: 0 Abuse/Suicide Risk Screen Items Answer ABUSE/SUICIDE RISK SCREEN: Has anyone close to you tried to hurt or harm you recentlyo No Do you feel uncomfortable with anyone in your familyo No Has anyone forced you do things that you didnot want to doo No Do you have any thoughts of harming yourselfo No Patient displays signs or symptoms of abuse and/or neglect. No Electronic Signature(s) Signed: 02/11/2017 5:04:43 PM By: Alric Quan Entered By: Alric Quan on 02/11/2017 08:38:39 [name] [name] ([id number]) -------------------------------------------------------------------------------- Activities of Daily Living Details Patient Name: [name] [name], [name] [name]. Date of Service: 02/11/2017 8:00 AM Medical Record Number: [id number] Patient Account Number: [id number] Date of Birth/Sex: 11-29-1922 (83 y.o. Female) Treating RN: Carolyne Fiscal, Debi Primary Care Courtnee Myer: Celedonio Miyamoto Other Clinician: Referring Va Broadwell: Celedonio Miyamoto Treating Ashleyanne Hemmingway/Extender: Frann Rider in Treatment: 0 Activities of Daily Living Items Answer Activities of Daily Living (Please select one for each item) Drive Automobile Not Able Take Medications Need Assistance Use Telephone Need Assistance Care for Appearance Not Able Use Toilet Not Able Bath / Shower Not Able Dress Self Not Able Feed Self Completely Able Walk Not Able Get In / Out Bed Not Able Housework Not Able Prepare Meals Not Able Handle Money Not Able Shop for Self Not Able Electronic  Signature(s) Signed: 02/11/2017 5:04:43 PM By: Alric Quan Entered By: Alric Quan on 02/11/2017 08:39:34 [name] [name] ([id number]) -------------------------------------------------------------------------------- Education Assessment Details Patient Name: [name] [name] Date of Service: 02/11/2017 8:00 AM Medical Record Number: [id number] Patient Account Number: [id number] Date of Birth/Sex: August 07, 1923 (83 y.o. Female) Treating RN: Carolyne Fiscal, Debi Primary Care Duan Scharnhorst: Celedonio Miyamoto Other Clinician: Referring Rivky Clendenning: Celedonio Miyamoto Treating Axzel Rockhill/Extender: Frann Rider in Treatment: 0 Primary Learner Assessed: Patient Learning Preferences/Education Level/Primary Language Learning Preference: Explanation, Printed Material Highest Education Level: High School Preferred Language: English Cognitive Barrier Assessment/Beliefs Language Barrier: No Translator Needed: No Memory Deficit: No Emotional Barrier: No Cultural/Religious Beliefs Affecting Medical No Care: Physical Barrier Assessment Impaired Vision: Yes Glasses Impaired Hearing: Yes Hearing Aid Decreased Hand dexterity: No Knowledge/Comprehension Assessment Knowledge Level: Medium Comprehension Level: Medium Ability to understand written Medium instructions: Ability to understand verbal Medium instructions: Motivation Assessment Anxiety Level: Calm Cooperation: Cooperative Education Importance: Acknowledges Need Interest in Health Problems: Asks Questions Perception: Coherent Willingness to Engage in Self- Medium Management Activities: Readiness to Engage in Self- Medium Management Activities: Electronic Signature(s) [name] [name], [name] [name] ([id number]) Signed: 02/11/2017 5:04:43 PM By: Alric Quan Entered By: Alric Quan on 02/11/2017 08:39:58 [name] [name], [name] [name] ([id number]) -------------------------------------------------------------------------------- Fall Risk Assessment  Details Patient Name: [name] [name] Date of Service: 02/11/2017 8:00 AM Medical Record Number: [id number] Patient Account Number: [id number] Date of Birth/Sex: [birth year] (83 y.o. Female) Treating RN: Carolyne Fiscal, Debi Primary Care Brigetta Beckstrom: Celedonio Miyamoto Other Clinician: Referring Fariha Goto: Celedonio Miyamoto Treating Solymar Grace/Extender: Frann Rider in Treatment: 0 Fall Risk Assessment Items Have you had 2 or more falls in the last 12 monthso 0 Yes Have you had any fall that resulted in injury in the last 12 monthso 0 Yes FALL RISK ASSESSMENT: History of falling - immediate or within 3 months 25 Yes Secondary diagnosis 15 Yes Ambulatory aid None/bed rest/wheelchair/nurse 0 Yes Crutches/cane/walker  0 No Furniture 0 No IV Access/Saline Lock 0 No Gait/Training Normal/bed rest/immobile 0 No Weak 0 No Impaired 20 Yes Mental Status Oriented to own ability 0 Yes Electronic Signature(s) Signed: 02/11/2017 5:04:43 PM By: Alric Quan Entered By: Alric Quan on 02/11/2017 08:40:25 [name] [name] ([id number]) -------------------------------------------------------------------------------- Foot Assessment Details Patient Name: [name] [name] Date of Service: 02/11/2017 8:00 AM Medical Record Number: [id number] Patient Account Number: [id number] Date of Birth/Sex: May 19, 1923 (83 y.o. Female) Treating RN: Carolyne Fiscal, Debi Primary Care Daisey Caloca: Celedonio Miyamoto Other Clinician: Referring Eaton Folmar: Celedonio Miyamoto Treating Rikayla Demmon/Extender: Frann Rider in Treatment: 0 Foot Assessment Items Site Locations + = Sensation present, - = Sensation absent, C = Callus, U = Ulcer R = Redness, W = Warmth, M = Maceration, PU = Pre-ulcerative lesion F = Fissure, S = Swelling, D = Dryness Assessment Right: Left: Other Deformity: No No Prior Foot Ulcer: No No Prior Amputation: No No Charcot Joint: No No Ambulatory Status: Non-ambulatory Assistance Device:  Wheelchair Gait: Electronic Signature(s) Signed: 02/11/2017 5:04:43 PM By: Alric Quan Entered By: Alric Quan on 02/11/2017 08:45:38 [name] [name] ([id number]) -------------------------------------------------------------------------------- Nutrition Risk Assessment Details Patient Name: [name] [name] Date of Service: 02/11/2017 8:00 AM Medical Record Number: [id number] Patient Account Number: [id number] Date of Birth/Sex: [birth year] (83 y.o. Female) Treating RN: Carolyne Fiscal, Debi Primary Care Takera Rayl: Celedonio Miyamoto Other Clinician: Referring Adrean Heitz: Celedonio Miyamoto Treating Deshannon Hinchliffe/Extender: Frann Rider in Treatment: 0 Height (in): 66 Weight (lbs): 144 Body Mass Index (BMI): 23.2 Nutrition Risk Assessment Items NUTRITION RISK SCREEN: I have an illness or condition that made me change the kind and/or 2 Yes amount of food I eat I eat fewer than two meals per day 0 No I eat few fruits and vegetables, or milk products 0 No I have three or more drinks of beer, liquor or wine almost every day 0 No I have tooth or mouth problems that make it hard for me to eat 0 No I don't always have enough money to buy the food I need 0 No I eat alone most of the time 0 No I take three or more different prescribed or over-the-counter drugs a 1 Yes day Without wanting to, I have lost or gained 10 pounds in the last six 0 No months I am not always physically able to shop, cook and/or feed myself 0 No Nutrition Protocols Good Risk Protocol Moderate Risk Protocol Electronic Signature(s) Signed: 02/11/2017 5:04:43 PM By: Alric Quan Entered By: Alric Quan on 02/11/2017 08:42:06

## 2017-02-12 NOTE — Progress Notes (Signed)
[name] [name], [name] [name] ([id number]) Visit Report for 02/11/2017 Allergy List Details Patient Name: [name] [name], [name] [name]. Date of Service: 02/11/2017 8:00 AM Medical Record Number: [id number] Patient Account Number: [id number] Date of Birth/Sex: August 28, 1923 (83 y.o. Female) Treating RN: Carolyne Fiscal, Debi Primary Care Callen Zuba: Celedonio Miyamoto Other Clinician: Referring Lynnsie Linders: Celedonio Miyamoto Treating Alucard Fearnow/Extender: Frann Rider in Treatment: 0 Allergies Active Allergies morphine Zoloft cholesterol medications Allergy Notes Electronic Signature(s) Signed: 02/11/2017 5:04:43 PM By: Alric Quan Entered By: Alric Quan on 02/11/2017 08:14:47 [name] [name] ([id number]) -------------------------------------------------------------------------------- Arrival Information Details Patient Name: [name] [name] Date of Service: 02/11/2017 8:00 AM Medical Record Number: [id number] Patient Account Number: [id number] Date of Birth/Sex: [birth year] (83 y.o. Female) Treating RN: Carolyne Fiscal, Debi Primary Care Annella Prowell: Celedonio Miyamoto Other Clinician: Referring Cherl Gorney: Celedonio Miyamoto Treating Clinten Howk/Extender: Frann Rider in Treatment: 0 Visit Information Patient Arrived: Wheel Chair Arrival Time: 08:25 Accompanied By: daughter Transfer Assistance: EasyPivot Patient Lift Patient Identification Verified: Yes Secondary Verification Process Yes Completed: Patient Requires Transmission- No Based Precautions: Patient Has Alerts: Yes Patient Alerts: Patient on Blood Thinner Plavix Electronic Signature(s) Signed: 02/11/2017 5:04:43 PM By: Alric Quan Entered By: Alric Quan on 02/11/2017 08:30:22 [name] [name] ([id number]) -------------------------------------------------------------------------------- Clinic Level of Care Assessment Details Patient Name: [name] [name] Date of Service: 02/11/2017 8:00 AM Medical Record Number: [id number] Patient Account Number:  [id number] Date of Birth/Sex: Sep 25, 1923 (83 y.o. Female) Treating RN: Carolyne Fiscal, Debi Primary Care Chantrell Apsey: Celedonio Miyamoto Other Clinician: Referring Eriyana Sweeten: Celedonio Miyamoto Treating Darryn Kydd/Extender: Frann Rider in Treatment: 0 Clinic Level of Care Assessment Items TOOL 2 Quantity Score X - Use when only an EandM is performed on the INITIAL visit 1 0 ASSESSMENTS - Nursing Assessment / Reassessment X - General Physical Exam (combine w/ comprehensive assessment (listed just 1 20 below) when performed on new pt. evals) X - Comprehensive Assessment (HX, ROS, Risk Assessments, Wounds Hx, etc.) 1 25 ASSESSMENTS - Wound and Skin Assessment / Reassessment []  - Simple Wound Assessment / Reassessment - one wound 0 X - Complex Wound Assessment / Reassessment - multiple wounds 4 5 []  - Dermatologic / Skin Assessment (not related to wound area) 0 ASSESSMENTS - Ostomy and/or Continence Assessment and Care []  - Incontinence Assessment and Management 0 []  - Ostomy Care Assessment and Management (repouching, etc.) 0 PROCESS - Coordination of Care []  - Simple Patient / Family Education for ongoing care 0 X - Complex (extensive) Patient / Family Education for ongoing care 1 20 X - Staff obtains Programmer, systems, Records, Test Results / Process Orders 1 10 X - Staff telephones HHA, Nursing Homes / Clarify orders / etc 1 10 []  - Routine Transfer to another Facility (non-emergent condition) 0 []  - Routine Hospital Admission (non-emergent condition) 0 X - New Admissions / Biomedical engineer / Ordering NPWT, Apligraf, etc. 1 15 []  - Emergency Hospital Admission (emergent condition) 0 X - Simple Discharge Coordination 1 10 [name] [name], [name] O. ([id number]) []  - Complex (extensive) Discharge Coordination 0 PROCESS - Special Needs []  - Pediatric / Minor Patient Management 0 []  - Isolation Patient Management 0 []  - Hearing / Language / Visual special needs 0 []  - Assessment of Community assistance  (transportation, D/C planning, etc.) 0 []  - Additional assistance / Altered mentation 0 []  - Support Surface(s) Assessment (bed, cushion, seat, etc.) 0 INTERVENTIONS - Wound Cleansing / Measurement X - Wound Imaging (photographs - any number of wounds) 1 5 []  - Wound Tracing (instead of photographs) 0 []  - Simple Wound Measurement - one wound  0 X - Complex Wound Measurement - multiple wounds 4 5 []  - Simple Wound Cleansing - one wound 0 X - Complex Wound Cleansing - multiple wounds 4 5 INTERVENTIONS - Wound Dressings X - Small Wound Dressing one or multiple wounds 1 10 []  - Medium Wound Dressing one or multiple wounds 0 X - Large Wound Dressing one or multiple wounds 1 20 []  - Application of Medications - injection 0 INTERVENTIONS - Miscellaneous []  - External ear exam 0 []  - Specimen Collection (cultures, biopsies, blood, body fluids, etc.) 0 []  - Specimen(s) / Culture(s) sent or taken to Lab for analysis 0 []  - Patient Transfer (multiple staff / Civil Service fast streamer / Similar devices) 0 []  - Simple Staple / Suture removal (25 or less) 0 []  - Complex Staple / Suture removal (26 or more) 0 [name] [name], [name] O. ([id number]) []  - Hypo / Hyperglycemic Management (close monitor of Blood Glucose) 0 []  - Ankle / Brachial Index (ABI) - do not check if billed separately 0 Has the patient been seen at the hospital within the last three years: Yes Total Score: 205 Level Of Care: New/Established - Level 5 Electronic Signature(s) Signed: 02/11/2017 5:04:43 PM By: Alric Quan Entered By: Alric Quan on 02/11/2017 16:01:21 [name] [name] ([id number]) -------------------------------------------------------------------------------- Encounter Discharge Information Details Patient Name: [name] [name] Date of Service: 02/11/2017 8:00 AM Medical Record Number: [id number] Patient Account Number: [id number] Date of Birth/Sex: 07/26/1923 (83 y.o. Female) Treating RN: Carolyne Fiscal, Debi Primary Care Janiyah Beery:  Celedonio Miyamoto Other Clinician: Referring Dove Gresham: Celedonio Miyamoto Treating Trigger Frasier/Extender: Frann Rider in Treatment: 0 Encounter Discharge Information Items Discharge Pain Level: 0 Discharge Condition: Stable Ambulatory Status: Wheelchair Discharge Destination: Nursing Home Transportation: Private Auto Accompanied By: daughter Schedule Follow-up Appointment: Yes Medication Reconciliation completed and provided to Patient/Care No [name] [name]: Provided on Clinical Summary of Care: 02/11/2017 Form Type Recipient Paper Patient DB Electronic Signature(s) Signed: 02/11/2017 9:48:00 AM By: Ruthine Dose Entered By: Ruthine Dose on 02/11/2017 09:48:00 [name] [name] ([id number]) -------------------------------------------------------------------------------- Lower Extremity Assessment Details Patient Name: [name] [name] Date of Service: 02/11/2017 8:00 AM Medical Record Number: [id number] Patient Account Number: [id number] Date of Birth/Sex: [birth year] (83 y.o. Female) Treating RN: Carolyne Fiscal, Debi Primary Care Reino Lybbert: Celedonio Miyamoto Other Clinician: Referring Milani Lowenstein: Celedonio Miyamoto Treating Jazzmin Newbold/Extender: Frann Rider in Treatment: 0 Edema Assessment Assessed: [Left: No] [Right: No] E[Left: dema] [Right: :] Calf Left: Right: Point of Measurement: 28 cm From Medial Instep 27.4 cm cm Ankle Left: Right: Point of Measurement: 10 cm From Medial Instep 21.8 cm cm Vascular Assessment Pulses: Dorsalis Pedis Palpable: [Left:No] Posterior Tibial Palpable: [Left:Yes] Extremity colors, hair growth, and conditions: Extremity Color: [Left:Hyperpigmented] Temperature of Extremity: [Left:Cool] Capillary Refill: [Left:< 3 seconds] Toe Nail Assessment Left: Right: Thick: Yes Discolored: Yes Deformed: Yes Improper Length and Hygiene: Yes Electronic Signature(s) Signed: 02/11/2017 5:04:43 PM By: Alric Quan Entered By: Alric Quan on 02/11/2017  08:53:34 [name] [name] ([id number]) -------------------------------------------------------------------------------- Multi Wound Chart Details Patient Name: [name] [name] Date of Service: 02/11/2017 8:00 AM Medical Record Number: [id number] Patient Account Number: [id number] Date of Birth/Sex: 1923-09-30 (83 y.o. Female) Treating RN: Carolyne Fiscal, Debi Primary Care Debbi Strandberg: Celedonio Miyamoto Other Clinician: Referring Laiken Sandy: Celedonio Miyamoto Treating Damione Robideau/Extender: Frann Rider in Treatment: 0 Vital Signs Height(in): 66 Pulse(bpm): 69 Weight(lbs): 144 Blood Pressure 131/46 (mmHg): Body Mass Index(BMI): 23 Temperature(F): 97.7 Respiratory Rate 16 (breaths/min): Photos: [1:No Photos] [2:No Photos] [3:No Photos] Wound Location: [1:Left Foot - Dorsal] [2:Left Lower Leg - Anterior, Left Lower Leg - Lateral  Proximal] Wounding Event: [1:Gradually Appeared] [2:Gradually Appeared] [3:Gradually Appeared] Primary Etiology: [1:To be determined] [2:Venous Leg Ulcer] [3:Venous Leg Ulcer] Comorbid History: [1:Anemia, Congestive Heart Anemia, Congestive Heart Anemia, Congestive Heart Failure, Coronary Artery Failure, Coronary Artery Failure, Coronary Artery Disease, Hypertension, Peripheral Venous Disease, Neuropathy] [2:Disease,  Hypertension, Peripheral Venous Disease, Neuropathy] [3:Disease, Hypertension, Peripheral Venous Disease, Neuropathy] Date Acquired: [1:02/08/2017] [2:02/08/2017] [3:02/08/2017] Weeks of Treatment: [1:0] [2:0] [3:0] Wound Status: [1:Open] [2:Open] [3:Open] Clustered Wound: [1:No] [2:Yes] [3:Yes] Clustered Quantity: [1:N/A] [2:4] [3:7] Measurements L x W x D 1.5x1.5x0.1 [2:10x3.5x0.1] [3:9.5x4x0.1] (cm) Area (cm) : [1:1.767] [2:27.489] [3:29.845] Volume (cm) : [1:0.177] [2:2.749] [3:2.985] % Reduction in Area: [1:N/A] [2:0.00%] [3:N/A] % Reduction in Volume: N/A [2:0.00%] [3:N/A] Classification: [1:Partial Thickness] [2:Partial Thickness] [3:Partial  Thickness] Exudate Amount: [1:Large] [2:Large] [3:Large] Exudate Type: [1:Serosanguineous] [2:Serosanguineous] [3:Serosanguineous] Exudate Color: [1:red, brown] [2:red, brown] [3:red, brown] Wound Margin: [1:Distinct, outline attached Distinct, outline attached Distinct, outline attached] Granulation Amount: [1:Medium (34-66%)] [2:Large (67-100%)] [3:Large (67-100%)] Granulation Quality: [1:Red] [2:Red] [3:Red] Necrotic Amount: [1:Medium (34-66%)] [2:None Present (0%)] [3:Small (1-33%)] Necrotic Tissue: [1:Eschar, Adherent Slough N/A] [3:Eschar] Epithelialization: None None None Periwound Skin Texture: No Abnormalities Noted No Abnormalities Noted No Abnormalities Noted Periwound Skin Maceration: Yes No Abnormalities Noted Maceration: Yes Moisture: Periwound Skin Color: No Abnormalities Noted No Abnormalities Noted No Abnormalities Noted Temperature: No Abnormality No Abnormality No Abnormality Tenderness on Yes Yes Yes Palpation: Wound Preparation: Ulcer Cleansing: Ulcer Cleansing: Ulcer Cleansing: Rinsed/Irrigated with Rinsed/Irrigated with Rinsed/Irrigated with Saline Saline Saline Topical Anesthetic Topical Anesthetic Topical Anesthetic Applied: Other: lidocaine Applied: Other: lidocaine Applied: Other: lidocaine 4% 4% 4% Wound Number: 4 N/A N/A Photos: No Photos N/A N/A Wound Location: Left Gluteus N/A N/A Wounding Event: Pressure Injury N/A N/A Primary Etiology: Pressure Ulcer N/A N/A Comorbid History: Anemia, Congestive Heart N/A N/A Failure, Coronary Artery Disease, Hypertension, Peripheral Venous Disease, Neuropathy Date Acquired: 01/18/2017 N/A N/A Weeks of Treatment: 0 N/A N/A Wound Status: Open N/A N/A Clustered Wound: No N/A N/A Clustered Quantity: N/A N/A N/A Measurements L x W x D 1.2x0.3x0.1 N/A N/A (cm) Area (cm) : 0.283 N/A N/A Volume (cm) : 0.028 N/A N/A % Reduction in Area: N/A N/A N/A % Reduction in Volume: N/A N/A N/A Classification:  Category/Stage II N/A N/A Exudate Amount: Medium N/A N/A Exudate Type: Serosanguineous N/A N/A Exudate Color: red, brown N/A N/A Wound Margin: Distinct, outline attached N/A N/A Granulation Amount: Large (67-100%) N/A N/A Granulation Quality: Red N/A N/A Necrotic Amount: Small (1-33%) N/A N/A Necrotic Tissue: Eschar N/A N/A Epithelialization: None N/A N/A Periwound Skin Texture: No Abnormalities Noted N/A N/A No Abnormalities Noted N/A N/A [name] [name], [name] [name] ([id number]) Periwound Skin Moisture: Periwound Skin Color: No Abnormalities Noted N/A N/A Temperature: No Abnormality N/A N/A Tenderness on Yes N/A N/A Palpation: Wound Preparation: Ulcer Cleansing: N/A N/A Rinsed/Irrigated with Saline Topical Anesthetic Applied: Other: lidocaine 4% Treatment Notes Electronic Signature(s) Signed: 02/11/2017 9:52:52 AM By: Christin Fudge MD, FACS Entered By: Christin Fudge on 02/11/2017 09:52:52 [name] [name] ([id number]) -------------------------------------------------------------------------------- [place] Details Patient Name: SHAKTI, FLEER Date of Service: 02/11/2017 8:00 AM Medical Record Number: [id number] Patient Account Number: [id number] Date of Birth/Sex: 09-13-1923 (83 y.o. Female) Treating RN: Carolyne Fiscal, Debi Primary Care Arnold Kester: Celedonio Miyamoto Other Clinician: Referring Fady Stamps: Celedonio Miyamoto Treating Oddis Westling/Extender: Frann Rider in Treatment: 0 Active Inactive ` Abuse / Safety / Falls / Self Care Management Nursing Diagnoses: Potential for falls Goals: Patient will remain injury free Date Initiated: 02/11/2017 Target Resolution Date: 05/22/2017 Goal Status: Active Interventions:  Assess fall risk on admission and as needed Assess self care needs on admission and as needed Notes: ` Nutrition Nursing Diagnoses: Imbalanced nutrition Potential for alteratiion in Nutrition/Potential for imbalanced nutrition Goals: Patient/caregiver  agrees to and verbalizes understanding of need to use nutritional supplements and/or vitamins as prescribed Date Initiated: 02/11/2017 Target Resolution Date: 05/22/2017 Goal Status: Active Interventions: Assess patient nutrition upon admission and as needed per policy Notes: ` Orientation to the [place], [place]. ([id number]) Nursing Diagnoses: Knowledge deficit related to the wound healing center program Goals: Patient/caregiver will verbalize understanding of the [place] Program Date Initiated: 02/11/2017 Target Resolution Date: 02/20/2017 Goal Status: Active Interventions: Provide education on orientation to the wound center Notes: ` Pain, Acute or Chronic Nursing Diagnoses: Pain, acute or chronic: actual or potential Potential alteration in comfort, pain Goals: Patient/caregiver will verbalize adequate pain control between visits Date Initiated: 02/11/2017 Target Resolution Date: 05/22/2017 Goal Status: Active Interventions: Assess comfort goal upon admission Complete pain assessment as per visit requirements Notes: ` Pressure Nursing Diagnoses: Knowledge deficit related to causes and risk factors for pressure ulcer development Knowledge deficit related to management of pressures ulcers Potential for impaired tissue integrity related to pressure, friction, moisture, and shear Goals: Patient/caregiver will verbalize risk factors for pressure ulcer development Date Initiated: 02/11/2017 Target Resolution Date: 05/22/2017 Goal Status: Active Interventions: Assess: immobility, friction, shearing, incontinence upon admission and as needed [name] [name], [name] [name] ([id number]) Notes: ` Wound/Skin Impairment Nursing Diagnoses: Impaired tissue integrity Knowledge deficit related to ulceration/compromised skin integrity Goals: Ulcer/skin breakdown will have a volume reduction of 80% by week 12 Date Initiated: 02/11/2017 Target Resolution Date:  05/22/2017 Goal Status: Active Interventions: Assess patient/caregiver ability to perform ulcer/skin care regimen upon admission and as needed Assess ulceration(s) every visit Notes: Electronic Signature(s) Signed: 02/11/2017 5:04:43 PM By: Alric Quan Entered By: Alric Quan on 02/11/2017 09:18:56 [name] [name] ([id number]) -------------------------------------------------------------------------------- Pain Assessment Details Patient Name: [name] [name] Date of Service: 02/11/2017 8:00 AM Medical Record Number: [id number] Patient Account Number: [id number] Date of Birth/Sex: 1923-02-04 (83 y.o. Female) Treating RN: Carolyne Fiscal, Debi Primary Care Luiza Carranco: Celedonio Miyamoto Other Clinician: Referring Sunnie Odden: Celedonio Miyamoto Treating Cathline Dowen/Extender: Frann Rider in Treatment: 0 Active Problems Location of Pain Severity and Description of Pain Patient Has Paino No Site Locations With Dressing Change: No Pain Management and Medication Current Pain Management: Electronic Signature(s) Signed: 02/11/2017 5:04:43 PM By: Alric Quan Entered By: Alric Quan on 02/11/2017 08:30:29 [name] [name] ([id number]) -------------------------------------------------------------------------------- Patient/Caregiver Education Details Patient Name: [name] [name] Date of Service: 02/11/2017 8:00 AM Medical Record Number: [id number] Patient Account Number: [id number] Date of Birth/Gender: Jun 03, 1923 (83 y.o. Female) Treating RN: Carolyne Fiscal, Debi Primary Care Physician: Celedonio Miyamoto Other Clinician: Referring Physician: Celedonio Miyamoto Treating Physician/Extender: Frann Rider in Treatment: 0 Education Assessment Education Provided To: Patient and Caregiver daughter Education Topics Provided Welcome To [facility]: Handouts: Welcome To [facility] Methods: Explain/Verbal Responses: State content correctly Wound/Skin  Impairment: Handouts: Other: change dressing as ordered Methods: Demonstration, Explain/Verbal Responses: State content correctly Electronic Signature(s) Signed: 02/11/2017 5:04:43 PM By: Alric Quan Entered By: Alric Quan on 02/11/2017 09:20:08 [name] [name] ([id number]) -------------------------------------------------------------------------------- Wound Assessment Details Patient Name: [name] [name] Date of Service: 02/11/2017 8:00 AM Medical Record Number: [id number] Patient Account Number: [id number] Date of Birth/Sex: 26-Jun-1923 (83 y.o. Female) Treating RN: Carolyne Fiscal, Debi Primary Care Laasia Arcos: Celedonio Miyamoto Other Clinician: Referring Asenath Balash: Celedonio Miyamoto Treating Tora Prunty/Extender: Frann Rider in Treatment: 0  Wound Status Wound Number: 1 Primary To be determined Etiology: Wound Location: Left Foot - Dorsal Wound Open Wounding Event: Gradually Appeared Status: Date Acquired: 02/08/2017 Comorbid Anemia, Congestive Heart Failure, Weeks Of Treatment: 0 History: Coronary Artery Disease, Hypertension, Clustered Wound: No Peripheral Venous Disease, Neuropathy Photos Photo Uploaded By: Alric Quan on 02/11/2017 16:52:15 Wound Measurements Length: (cm) 1.5 Width: (cm) 1.5 Depth: (cm) 0.1 Area: (cm) 1.767 Volume: (cm) 0.177 % Reduction in Area: % Reduction in Volume: Epithelialization: None Tunneling: No Undermining: No Wound Description Classification: Partial Thickness Foul Odor Aft Wound Margin: Distinct, outline attached Slough/Fibrin Exudate Amount: Large Exudate Type: Serosanguineous Exudate Color: red, brown er Cleansing: No o No Wound Bed Granulation Amount: Medium (34-66%) Granulation Quality: Red Necrotic Amount: Medium (34-66%) Necrotic Quality: Eschar, Adherent [address] [place]. ([id number]) Periwound Skin Texture Texture Color No Abnormalities Noted: No No Abnormalities Noted: No Moisture Temperature /  Pain No Abnormalities Noted: No Temperature: No Abnormality Maceration: Yes Tenderness on Palpation: Yes Wound Preparation Ulcer Cleansing: Rinsed/Irrigated with Saline Topical Anesthetic Applied: Other: lidocaine 4%, Treatment Notes Wound #1 (Left, Dorsal Foot) 1. Cleansed with: Clean wound with Normal Saline 2. Anesthetic Topical Lidocaine 4% cream to wound bed prior to debridement 4. Dressing Applied: Santyl Ointment 5. Secondary Dressing Applied ABD Pad Dry Gauze 7. Secured with Tape Notes kerlix, coban Electronic Signature(s) Signed: 02/11/2017 5:04:43 PM By: Alric Quan Entered By: Alric Quan on 02/11/2017 08:55:51 [name] [name] ([id number]) -------------------------------------------------------------------------------- Wound Assessment Details Patient Name: [name] [name] Date of Service: 02/11/2017 8:00 AM Medical Record Number: [id number] Patient Account Number: [id number] Date of Birth/Sex: 1923-02-04 (83 y.o. Female) Treating RN: Carolyne Fiscal, Debi Primary Care Isley Weisheit: Celedonio Miyamoto Other Clinician: Referring Laquincy Eastridge: Celedonio Miyamoto Treating Lorece Keach/Extender: Frann Rider in Treatment: 0 Wound Status Wound Number: 2 Primary Venous Leg Ulcer Etiology: Wound Location: Left Lower Leg - Anterior, Proximal Wound Open Status: Wounding Event: Gradually Appeared Comorbid Anemia, Congestive Heart Failure, Date Acquired: 02/08/2017 History: Coronary Artery Disease, Hypertension, Weeks Of Treatment: 0 Peripheral Venous Disease, Neuropathy Clustered Wound: Yes Photos Photo Uploaded By: Alric Quan on 02/11/2017 16:53:03 Wound Measurements Length: (cm) 10 % Reduction i Width: (cm) 3.5 % Reduction i Depth: (cm) 0.1 Epithelializa Clustered Quantity: 4 Tunneling: Area: (cm) 27.489 Undermining: Volume: (cm) 2.749 n Area: 0% n Volume: 0% tion: None No No Wound Description Classification: Partial Thickness Foul Odor Aft Wound  Margin: Distinct, outline attached Slough/Fibrin Exudate Amount: Large Exudate Type: Serosanguineous Exudate Color: red, brown er Cleansing: No o No Wound Bed Granulation Amount: Large (67-100%) Granulation Quality: Red [name] [name], [place]. ([id number]) Necrotic Amount: None Present (0%) Periwound Skin Texture Texture Color No Abnormalities Noted: No No Abnormalities Noted: No Moisture Temperature / Pain No Abnormalities Noted: No Temperature: No Abnormality Tenderness on Palpation: Yes Wound Preparation Ulcer Cleansing: Rinsed/Irrigated with Saline Topical Anesthetic Applied: Other: lidocaine 4%, Treatment Notes Wound #2 (Left, Proximal, Anterior Lower Leg) 1. Cleansed with: Clean wound with Normal Saline 2. Anesthetic Topical Lidocaine 4% cream to wound bed prior to debridement 4. Dressing Applied: Aquacel Ag 5. Secondary Dressing Applied ABD Pad Dry Gauze 7. Secured with Tape Notes kerlix, coban Electronic Signature(s) Signed: 02/11/2017 5:04:43 PM By: Alric Quan Entered By: Alric Quan on 02/11/2017 09:01:06 [name] [name] ([id number]) -------------------------------------------------------------------------------- Wound Assessment Details Patient Name: [name] [name] Date of Service: 02/11/2017 8:00 AM Medical Record Number: [id number] Patient Account Number: [id number] Date of Birth/Sex: 04-Sep-1923 (83 y.o. Female) Treating RN: Carolyne Fiscal, Debi Primary Care Nattalie Santiesteban: Celedonio Miyamoto Other Clinician: Referring Leronda Lewers:  Celedonio Miyamoto Treating Taleia Sadowski/Extender: Frann Rider in Treatment: 0 Wound Status Wound Number: 3 Primary Venous Leg Ulcer Etiology: Wound Location: Left Lower Leg - Lateral Wound Open Wounding Event: Gradually Appeared Status: Date Acquired: 02/08/2017 Comorbid Anemia, Congestive Heart Failure, Weeks Of Treatment: 0 History: Coronary Artery Disease, Hypertension, Clustered Wound: Yes Peripheral Venous Disease,  Neuropathy Photos Photo Uploaded By: Alric Quan on 02/11/2017 16:53:04 Wound Measurements Length: (cm) 9.5 % Reduction i Width: (cm) 4 % Reduction i Depth: (cm) 0.1 Epithelializa Clustered Quantity: 7 Tunneling: Area: (cm) 29.845 Undermining: Volume: (cm) 2.985 n Area: n Volume: tion: None No No Wound Description Classification: Partial Thickness Wound Margin: Distinct, outline attached Exudate Amount: Large Exudate Type: Serosanguineous Exudate Color: red, brown Foul Odor After Cleansing: No Wound Bed Granulation Amount: Large (67-100%) Granulation Quality: Red Necrotic Amount: Small (1-33%) [name] [name], [name] O. ([id number]) Necrotic Quality: Eschar Periwound Skin Texture Texture Color No Abnormalities Noted: No No Abnormalities Noted: No Moisture Temperature / Pain No Abnormalities Noted: No Temperature: No Abnormality Maceration: Yes Tenderness on Palpation: Yes Wound Preparation Ulcer Cleansing: Rinsed/Irrigated with Saline Topical Anesthetic Applied: Other: lidocaine 4%, Treatment Notes Wound #3 (Left, Lateral Lower Leg) 1. Cleansed with: Clean wound with Normal Saline 2. Anesthetic Topical Lidocaine 4% cream to wound bed prior to debridement 4. Dressing Applied: Aquacel Ag 5. Secondary Dressing Applied ABD Pad Dry Gauze 7. Secured with Tape Notes kerlix, coban Electronic Signature(s) Signed: 02/11/2017 5:04:43 PM By: Alric Quan Entered By: Alric Quan on 02/11/2017 09:00:51 [name] [name] ([id number]) -------------------------------------------------------------------------------- Wound Assessment Details Patient Name: [name] [name] Date of Service: 02/11/2017 8:00 AM Medical Record Number: [id number] Patient Account Number: [id number] Date of Birth/Sex: Feb 06, 1923 (83 y.o. Female) Treating RN: Carolyne Fiscal, Debi Primary Care Melayah Skorupski: Celedonio Miyamoto Other Clinician: Referring Abdon Petrosky: Celedonio Miyamoto Treating Hara Milholland/Extender:  Frann Rider in Treatment: 0 Wound Status Wound Number: 4 Primary Pressure Ulcer Etiology: Wound Location: Left Gluteus Wound Open Wounding Event: Pressure Injury Status: Date Acquired: 01/18/2017 Comorbid Anemia, Congestive Heart Failure, Weeks Of Treatment: 0 History: Coronary Artery Disease, Hypertension, Clustered Wound: No Peripheral Venous Disease, Neuropathy Photos Photo Uploaded By: Alric Quan on 02/11/2017 16:54:09 Wound Measurements Length: (cm) 1.2 Width: (cm) 0.3 Depth: (cm) 0.1 Area: (cm) 0.283 Volume: (cm) 0.028 % Reduction in Area: % Reduction in Volume: Epithelialization: None Tunneling: No Undermining: No Wound Description Classification: Category/Stage II Foul Odor Aft Wound Margin: Distinct, outline attached Slough/Fibrin Exudate Amount: Medium Exudate Type: Serosanguineous Exudate Color: red, brown er Cleansing: No o No Wound Bed Granulation Amount: Large (67-100%) Granulation Quality: Red Necrotic Amount: Small (1-33%) Necrotic Quality: [name] [name], [name] [name] ([id number]) Periwound Skin Texture Texture Color No Abnormalities Noted: No No Abnormalities Noted: No Moisture Temperature / Pain No Abnormalities Noted: No Temperature: No Abnormality Tenderness on Palpation: Yes Wound Preparation Ulcer Cleansing: Rinsed/Irrigated with Saline Topical Anesthetic Applied: Other: lidocaine 4%, Treatment Notes Wound #4 (Left Gluteus) 1. Cleansed with: Clean wound with Normal Saline 2. Anesthetic Topical Lidocaine 4% cream to wound bed prior to debridement 3. Peri-wound Care: Skin Prep 4. Dressing Applied: Aquacel Ag 5. Secondary Dressing Applied Bordered Foam Dressing Electronic Signature(s) Signed: 02/11/2017 5:04:43 PM By: Alric Quan Entered By: Alric Quan on 02/11/2017 09:06:03 [name] [name] ([id number]) -------------------------------------------------------------------------------- [place] Details Patient  Name: [name] [name] Date of Service: 02/11/2017 8:00 AM Medical Record Number: [id number] Patient Account Number: [id number] Date of Birth/Sex: 1922-11-23 (83 y.o. Female) Treating RN: Carolyne Fiscal, Debi Primary Care Forney Kleinpeter: Celedonio Miyamoto Other Clinician: Referring Paris Ch[place]: Celedonio Miyamoto Treating Kynli Chou/Extender: Con Memos  Errol Weeks in Treatment: 0 Vital Signs Time Taken: 08:30 Temperature (F): 97.7 Height (in): 66 Pulse (bpm): 69 Source: Stated Respiratory Rate (breaths/min): 16 Weight (lbs): 144 Blood Pressure (mmHg): 131/46 Source: Measured Reference Range: 80 - 120 mg / dl Body Mass Index (BMI): 23.2 Electronic Signature(s) Signed: 02/11/2017 5:04:43 PM By: Alric Quan Entered By: Alric Quan on 02/11/2017 08:31:01

## 2017-02-15 ENCOUNTER — Emergency Department
Admission: EM | Admit: 2017-02-15 | Discharge: 2017-02-15 | Disposition: A | Discharge status: Home / Self Care | Payer: Medicare Other | Source: Home / Self Care | Attending: Emergency Medicine | Admitting: Emergency Medicine

## 2017-02-15 ENCOUNTER — Emergency Department: Payer: Medicare Other

## 2017-02-15 DIAGNOSIS — I2510 Atherosclerotic heart disease of native coronary artery without angina pectoris: Secondary | ICD-10-CM | POA: Insufficient documentation

## 2017-02-15 DIAGNOSIS — R0601 Orthopnea: Secondary | ICD-10-CM | POA: Diagnosis not present

## 2017-02-15 DIAGNOSIS — Z79899 Other long term (current) drug therapy: Secondary | ICD-10-CM | POA: Insufficient documentation

## 2017-02-15 DIAGNOSIS — I110 Hypertensive heart disease with heart failure: Secondary | ICD-10-CM | POA: Insufficient documentation

## 2017-02-15 DIAGNOSIS — R0602 Shortness of breath: Secondary | ICD-10-CM | POA: Diagnosis not present

## 2017-02-15 DIAGNOSIS — J441 Chronic obstructive pulmonary disease with (acute) exacerbation: Secondary | ICD-10-CM

## 2017-02-15 DIAGNOSIS — N179 Acute kidney failure, unspecified: Secondary | ICD-10-CM | POA: Diagnosis not present

## 2017-02-15 DIAGNOSIS — I5032 Chronic diastolic (congestive) heart failure: Secondary | ICD-10-CM | POA: Insufficient documentation

## 2017-02-15 DIAGNOSIS — R05 Cough: Secondary | ICD-10-CM | POA: Diagnosis not present

## 2017-02-15 DIAGNOSIS — Z7982 Long term (current) use of aspirin: Secondary | ICD-10-CM

## 2017-02-15 DIAGNOSIS — N184 Chronic kidney disease, stage 4 (severe): Secondary | ICD-10-CM | POA: Diagnosis not present

## 2017-02-15 DIAGNOSIS — I130 Hypertensive heart and chronic kidney disease with heart failure and stage 1 through stage 4 chronic kidney disease, or unspecified chronic kidney disease: Secondary | ICD-10-CM | POA: Diagnosis not present

## 2017-02-15 DIAGNOSIS — I5033 Acute on chronic diastolic (congestive) heart failure: Secondary | ICD-10-CM | POA: Diagnosis not present

## 2017-02-15 DIAGNOSIS — E46 Unspecified protein-calorie malnutrition: Secondary | ICD-10-CM | POA: Diagnosis not present

## 2017-02-15 LAB — COMPREHENSIVE METABOLIC PANEL
ALBUMIN: 3.3 g/dL — AB (ref 3.5–5.0)
ALT: 7 U/L — AB (ref 14–54)
AST: 19 U/L (ref 15–41)
Alkaline Phosphatase: 165 U/L — ABNORMAL HIGH (ref 38–126)
Anion gap: 8 (ref 5–15)
BUN: 56 mg/dL — AB (ref 6–20)
CHLORIDE: 100 mmol/L — AB (ref 101–111)
CO2: 31 mmol/L (ref 22–32)
Calcium: 9.5 mg/dL (ref 8.9–10.3)
Creatinine, Ser: 2.04 mg/dL — ABNORMAL HIGH (ref 0.44–1.00)
GFR calc Af Amer: 23 mL/min — ABNORMAL LOW (ref >60)
GFR calc non Af Amer: 20 mL/min — ABNORMAL LOW (ref >60)
GLUCOSE: 120 mg/dL — AB (ref 65–99)
Potassium: 4.9 mmol/L (ref 3.5–5.1)
SODIUM: 139 mmol/L (ref 135–145)
Total Bilirubin: 0.6 mg/dL (ref 0.3–1.2)
Total Protein: 7.3 g/dL (ref 6.5–8.1)

## 2017-02-15 LAB — CBC WITH DIFFERENTIAL/PLATELET
BASOS ABS: 0 10*3/uL (ref 0–0.1)
BASOS PCT: 0 %
Eosinophils Absolute: 0.6 10*3/uL (ref 0–0.7)
Eosinophils Relative: 6 %
HEMATOCRIT: 27.1 % — AB (ref 35.0–47.0)
HEMOGLOBIN: 8.9 g/dL — AB (ref 12.0–16.0)
Lymphocytes Relative: 21 %
Lymphs Abs: 1.8 10*3/uL (ref 1.0–3.6)
MCH: 33 pg (ref 26.0–34.0)
MCHC: 33 g/dL (ref 32.0–36.0)
MCV: 100 fL (ref 80.0–100.0)
Monocytes Absolute: 1.3 10*3/uL — ABNORMAL HIGH (ref 0.2–0.9)
Monocytes Relative: 14 %
NEUTROS ABS: 5.1 10*3/uL (ref 1.4–6.5)
NEUTROS PCT: 59 %
PLATELETS: 179 10*3/uL (ref 150–440)
RBC: 2.71 MIL/uL — ABNORMAL LOW (ref 3.80–5.20)
RDW: 14.9 % — ABNORMAL HIGH (ref 11.5–14.5)
WBC: 8.7 10*3/uL (ref 3.6–11.0)

## 2017-02-15 LAB — BRAIN NATRIURETIC PEPTIDE: B NATRIURETIC PEPTIDE 5: 14 pg/mL (ref 0.0–100.0)

## 2017-02-15 LAB — TROPONIN I: Troponin I: <0.03 ng/mL (ref <0.03)

## 2017-02-15 MED ORDER — IPRATROPIUM-ALBUTEROL 0.5-2.5 (3) MG/3ML IN SOLN
3.0000 mL | Freq: Once | RESPIRATORY_TRACT | Status: AC
  Administered 2017-02-15: 3 mL via RESPIRATORY_TRACT
  Filled 2017-02-15: qty 3

## 2017-02-15 MED ORDER — PREDNISONE 20 MG PO TABS
60.0000 mg | ORAL_TABLET | Freq: Once | ORAL | Status: AC
  Administered 2017-02-15: 60 mg via ORAL
  Filled 2017-02-15: qty 3

## 2017-02-15 MED ORDER — AZITHROMYCIN 500 MG PO TABS
500.0000 mg | ORAL_TABLET | Freq: Once | ORAL | Status: AC
  Administered 2017-02-15: 500 mg via ORAL
  Filled 2017-02-15: qty 1

## 2017-02-15 MED ORDER — SPACER/AERO-HOLD CHAMBER MASK MISC: 1.0000 [IU] | Freq: Four times a day (QID) | 0 refills | Status: AC | PRN

## 2017-02-15 MED ORDER — ALBUTEROL SULFATE HFA 108 (90 BASE) MCG/ACT IN AERS: 2.0000 | INHALATION_SPRAY | Freq: Four times a day (QID) | RESPIRATORY_TRACT | 0 refills | Status: DC | PRN

## 2017-02-15 MED ORDER — PREDNISONE 10 MG PO TABS: 50.0000 mg | ORAL_TABLET | Freq: Every day | ORAL | 0 refills | Status: DC

## 2017-02-15 MED ORDER — AZITHROMYCIN 250 MG PO TABS: ORAL_TABLET | ORAL | 0 refills | Status: DC

## 2017-02-15 NOTE — ED Triage Notes (Signed)
Pt arrives via [facility] for shortness of breath and congestion x 2 days. 2 months ago had pneumonia and was in hospital. Pt arrives from [facility] on 3 L nasal cannula, usually wears 2 L all the time. Pt had recent fall with L broken shoulder, arrives with immobilizer on. En route pt received 2 dounebs and 125mg  solumedrol. EMS reports inspiratory wheeze and rhonchi. Pt alert and oriented, wearing wig. Pt c/o of pain from sores from butt. Pt keeps asking about her phone, which is sitting on her lap.   EMS VS 193/83, 100% 3 L, CBG 173, HR 80.

## 2017-02-15 NOTE — ED Notes (Signed)
Dr. Rifenbark at bedside 

## 2017-02-15 NOTE — ED Notes (Signed)
This RN previously sent blood work on pt to lab. Called lab to make sure they could run the tests ordered. They stated they could.

## 2017-02-15 NOTE — ED Notes (Signed)
Pt states she takes medication with applesauce. Pt given cup of applesauce and had no difficulties swallowing.

## 2017-02-15 NOTE — ED Notes (Addendum)
Daughter requesting pt go back by ambulance since pt wears oxygen. Daughter doesn't have oxygen tank.   Daughter states its [facility] on [address].

## 2017-02-15 NOTE — ED Provider Notes (Signed)
[facility] Emergency Department Provider Note  ____________________________________________   First MD Initiated Contact with Patient 02/15/17 1705     (approximate)  I have reviewed the triage vital signs and the nursing notes.   HISTORY  Chief Complaint Shortness of Breath    HPI [name] [name] is a 83 y.o. female comes to the emergency department via EMS for several days of shortness of breath and cough that is productive of slight amounts of yellow sputum. She has a past medical history of congestive heart failure as well as emphysema. She normally is on 2 L of oxygen at her nursing home however according to reports she has been on 3 L recently. The patient did not want to come to the hospital but the nurse at her facility called. She sleeps on 2 pillows and this has not changed. She does not wake up at night short of breath. She denies chest pain. She denies abdominal pain nausea or vomiting. Her shortness of breath is slightly worse with exertion. Slightly improved with rest. The shortness of breath has been more or less constant for the past 2-3 days. She denies fevers or chills.EMS gave the patient to duo nebs prior to arrival which they felt helped her oxygenation as well as her work of breathing.   Past Medical History:  Diagnosis Date  . Anemia   . CHF (congestive heart failure) ([place])   . Coronary artery disease   . Emphysema lung ([place])   . Emphysema of lung ([place])   . GERD (gastroesophageal reflux disease)   . Hyperlipidemia   . Hypertension   . Neuropathy   . Osteoporosis   . Peripheral vascular disease ([place])   . Renal disorder   . Stroke [facility])     Patient Active Problem List   Diagnosis Date Noted  . Chronic diastolic heart failure ([place]) 12/15/2016  . HTN (hypertension) 12/15/2016  . Hypotension 11/16/2016  . CAP (community acquired pneumonia) 11/07/2016  . Generalized anxiety disorder 09/26/2016  . PAD (peripheral artery disease)  ([place]) 10/05/2013  . Chronic venous insufficiency 10/05/2013    Past Surgical History:  Procedure Laterality Date  . ABDOMINAL HYSTERECTOMY  1982  . CATARACT EXTRACTION    . CHOLECYSTECTOMY  1992  . JOINT REPLACEMENT  2005    Prior to Admission medications   Medication Sig Start Date End Date Taking? Authorizing Provider  acetaminophen (TYLENOL) 325 MG tablet Take 650 mg by mouth every 6 (six) hours as needed for mild pain or moderate pain.    Historical Provider, MD  albuterol (PROVENTIL HFA;VENTOLIN HFA) 108 (90 Base) MCG/ACT inhaler Inhale 2 puffs into the lungs every 6 (six) hours as needed for wheezing or shortness of breath. 02/15/17   Darel Hong, MD  ALPRAZolam Duanne Moron) 0.25 MG tablet Take 0.25 mg by mouth 2 (two) times daily as needed for anxiety or agitation. 11/03/16   Historical Provider, MD  aspirin 81 MG tablet Take 81 mg by mouth daily.    Historical Provider, MD  azithromycin (ZITHROMAX Z-PAK) 250 MG tablet Take 2 tablets (500 mg) on  Day 1,  followed by 1 tablet (250 mg) once daily on Days 2 through 5. 02/15/17 02/20/17  Darel Hong, MD  carvedilol (COREG) 12.5 MG tablet Take 1 tablet (12.5 mg total) by mouth 2 (two) times daily. 10/06/16 10/06/17  Schuyler Amor, MD  celecoxib (CELEBREX) 100 MG capsule Take 100 mg by mouth every other day as needed. 10/15/16   Historical Provider,  MD  Cholecalciferol 2000 units CAPS Take 4,000 Units by mouth daily.     Historical Provider, MD  clopidogrel (PLAVIX) 75 MG tablet Take 1 tablet by mouth daily. 09/18/13   Historical Provider, MD  cyanocobalamin 1000 MCG tablet Take 1,000 mcg by mouth daily.     Historical Provider, MD  dicyclomine (BENTYL) 20 MG tablet Take 1 tablet (20 mg total) by mouth 3 (three) times daily as needed for spasms. 11/28/15   Daymon Larsen, MD  feeding supplement (BOOST HIGH PROTEIN) LIQD Take 1 Container by mouth daily.    Historical Provider, MD  FLECTOR 1.3 % PTCH Place 1 patch onto the skin every 12 (twelve)  hours.  09/18/13   Historical Provider, MD  furosemide (LASIX) 20 MG tablet Take 1 tablet (20 mg total) by mouth daily. Patient taking differently: Take 20 mg by mouth every other day.  10/06/16   Schuyler Amor, MD  gabapentin (NEURONTIN) 300 MG capsule Take 1 capsule by mouth 3 (three) times daily.  09/04/13   Historical Provider, MD  loratadine (CLARITIN) 10 MG tablet Take 10 mg by mouth daily.    Historical Provider, MD  neomycin-bacitracin-polymyxin (NEOSPORIN) [phone] ointment Apply 1 application topically daily.    Historical Provider, MD  nitroGLYCERIN (NITRODUR - DOSED IN MG/24 HR) 0.2 mg/hr patch Place 1 patch onto the skin daily.  09/18/13   Historical Provider, MD  ondansetron (ZOFRAN) 4 MG tablet Take 1 tablet (4 mg total) by mouth every 8 (eight) hours as needed for nausea or vomiting. 11/28/15   Daymon Larsen, MD  polyethylene glycol [facility] / Floria Raveling) packet Take 17 g by mouth daily.    Historical Provider, MD  potassium chloride SA (K-DUR,KLOR-CON) 10 MEQ tablet Take 1 tablet (10 mEq total) by mouth daily. 11/09/16   [name], MD  predniSONE (DELTASONE) 10 MG tablet Take 5 tablets (50 mg total) by mouth daily. 02/15/17 02/19/17  Darel Hong, MD  Probiotic Product (ALIGN) 4 MG CAPS Take 4 mg by mouth daily.    Historical Provider, MD  ranitidine (ZANTAC) 150 MG capsule Take 150 mg by mouth 2 (two) times daily.    Historical Provider, MD  sennosides-docusate sodium (SENOKOT-S) 8.6-50 MG tablet Take 2 tablets by mouth daily.    Historical Provider, MD  Spacer/Aero-Hold Chamber Mask MISC 1 Units by Does not apply route every 6 (six) hours as needed. 02/15/17   Darel Hong, MD  traMADol (ULTRAM) 50 MG tablet Take 50 mg by mouth every 4 (four) hours as needed for moderate pain or severe pain.    Historical Provider, MD    Allergies Cholesterol; Morphine and related; and Zoloft [sertraline hcl]  Family History  Problem Relation Age of Onset  . Colon cancer Sister       Social History Social History  Substance Use Topics  . Smoking status: Never Smoker  . Smokeless tobacco: Never Used  . Alcohol use No    Review of Systems Constitutional: No fever/chills Eyes: No visual changes. ENT: No sore throat. Cardiovascular: Denies chest pain. Respiratory: Positive shortness of breath. Gastrointestinal: No abdominal pain.  No nausea, no vomiting.  No diarrhea.  No constipation. Genitourinary: Negative for dysuria. Musculoskeletal: Negative for back pain. Skin: Negative for rash. Neurological: Negative for headaches, focal weakness or numbness.  10-point ROS otherwise negative.  ____________________________________________   PHYSICAL EXAM:  VITAL SIGNS: ED Triage Vitals [02/15/17 1704]  Enc Vitals Group     BP      Pulse  Resp      Temp      Temp src      SpO2      Weight      Height      Head Circumference      Peak Flow      Pain Score 0     Pain Loc      Pain Edu?      Excl. in [place]?    Constitutional: Alert and oriented x 4 Chronically ill-appearing but in no acute respiratory distress speaking in full clear sentences without complication Eyes: PERRL EOMI. Head: Atraumatic. Nose: No congestion/rhinnorhea. Mouth/Throat: No trismus Neck: No stridor.   Cardiovascular: Normal rate, regular rhythm. Grossly normal heart sounds.  Good peripheral circulation. Able to lie completely flat with no jugular venous distention Respiratory: Slightly increased respiratory effort with wheezes throughout worse at the bases with slight crackles Gastrointestinal: Soft nondistended nontender no rebound or guarding no peritonitis Musculoskeletal: No lower extremity edema   Neurologic:  Normal speech and language. No gross focal neurologic deficits are appreciated. Skin:  Skin is warm, dry and intact. No rash noted. Psychiatric: Mood and affect are normal. Speech and behavior are normal.   ____________________________________________    DIFFERENTIAL  Pulmonary edema, COPD exacerbation, sepsis, pneumonia, CHF exacerbation ____________________________________________   LABS (all labs ordered are listed, but only abnormal results are displayed)  Labs Reviewed  CBC WITH DIFFERENTIAL/PLATELET - Abnormal; Notable for the following:       Result Value   RBC 2.71 (*)    Hemoglobin 8.9 (*)    HCT 27.1 (*)    RDW 14.9 (*)    Monocytes Absolute 1.3 (*)    All other components within normal limits  COMPREHENSIVE METABOLIC PANEL - Abnormal; Notable for the following:    Chloride 100 (*)    Glucose, Bld 120 (*)    BUN 56 (*)    Creatinine, Ser 2.04 (*)    Albumin 3.3 (*)    ALT 7 (*)    Alkaline Phosphatase 165 (*)    GFR calc non Af Amer 20 (*)    GFR calc Af Amer 23 (*)    All other components within normal limits  TROPONIN I  BRAIN NATRIURETIC PEPTIDE  No signs of acute ischemia  EKG  ED ECG REPORT I, Darel Hong, the attending physician, personally viewed and interpreted this ECG.  Date: 02/15/2017 Rate: 78 Rhythm: normal sinus rhythm QRS Axis: normal Intervals: normal ST/T Wave abnormalities: normal Conduction Disturbances: none Narrative Interpretation: Very difficult to interpret secondary to significant artifact but appears unremarkable  ____________________________________________  RADIOLOGY  Chest x-ray with mild bibasilar atelectasis but otherwise unremarkable ____________________________________________   PROCEDURES  Procedure(s) performed: no  Procedures  Critical Care performed: no  Observation: no ____________________________________________   INITIAL IMPRESSION / ASSESSMENT AND PLAN / ED COURSE  Pertinent labs & imaging results that were available during my care of the patient were reviewed by me and considered in my medical decision making (see chart for details).  By the time I saw the patient she guarded he had 2 DuoNeb nebs and appeared significantly improved. She was  breathing comfortably and did not require supplemental oxygen. She does have a history of CHF and is difficult to interpret whether or not this is a cardiac wheeze versus primary pulmonary well check a troponin and reevaluate.  Fortunately the patient's troponin is negative and her she's had symptoms for 2-3 days a single troponin is adequate. She feels improved  after her third DuoNeb and would like to go home which I think is reasonable. She has follow-up with her cardiologist tomorrow. I've encouraged her to follow up with her primary care physician in one week for reevaluation. She is discharged home in improved condition.      ____________________________________________   FINAL CLINICAL IMPRESSION(S) / ED DIAGNOSES  Final diagnoses:  COPD exacerbation ([place])      NEW MEDICATIONS STARTED DURING THIS VISIT:  New Prescriptions   ALBUTEROL (PROVENTIL HFA;VENTOLIN HFA) 108 (90 BASE) MCG/ACT INHALER    Inhale 2 puffs into the lungs every 6 (six) hours as needed for wheezing or shortness of breath.   AZITHROMYCIN (ZITHROMAX Z-PAK) 250 MG TABLET    Take 2 tablets (500 mg) on  Day 1,  followed by 1 tablet (250 mg) once daily on Days 2 through 5.   PREDNISONE (DELTASONE) 10 MG TABLET    Take 5 tablets (50 mg total) by mouth daily.   SPACER/AERO-HOLD CHAMBER MASK MISC    1 Units by Does not apply route every 6 (six) hours as needed.     Note:  This document was prepared using Dragon voice recognition software and may include unintentional dictation errors.     Darel Hong, MD 02/15/17 [phone]

## 2017-02-15 NOTE — ED Notes (Signed)
Pt stated that she was tired of waiting on EMS and requested that daughter take pt home. Daughter stated she would be about 40 minutes because she would have to run home and get an oxygen tank.

## 2017-02-15 NOTE — Discharge Instructions (Signed)
Please take your antibiotics and your prednisone as prescribed. Follow-up with your cardiologist tomorrow as scheduled and make an appointment to see her primary care physician within one week for recheck. Return to the emergency department for any concerns.  It was a pleasure to take care of you today, and thank you for coming to our emergency department.  If you have any questions or concerns before leaving please ask the nurse to grab me and I'm more than happy to go through your aftercare instructions again.  If you were prescribed any opioid pain medication today such as Norco, Vicodin, Percocet, morphine, hydrocodone, or oxycodone please make sure you do not drive when you are taking this medication as it can alter your ability to drive safely.  If you have any concerns once you are home that you are not improving or are in fact getting worse before you can make it to your follow-up appointment, please do not hesitate to call [phone] and come back for further evaluation.  Darel Hong MD  Results for orders placed or performed during the hospital encounter of 02/15/17  CBC with Differential  Result Value Ref Range   WBC 8.7 3.6 - 11.0 K/uL   RBC 2.71 (L) 3.80 - 5.20 MIL/uL   Hemoglobin 8.9 (L) 12.0 - 16.0 g/dL   HCT 27.1 (L) 35.0 - 47.0 %   MCV 100.0 80.0 - 100.0 fL   MCH 33.0 26.0 - 34.0 pg   MCHC 33.0 32.0 - 36.0 g/dL   RDW 14.9 (H) 11.5 - 14.5 %   Platelets 179 150 - 440 K/uL   Neutrophils Relative % 59 %   Neutro Abs 5.1 1.4 - 6.5 K/uL   Lymphocytes Relative 21 %   Lymphs Abs 1.8 1.0 - 3.6 K/uL   Monocytes Relative 14 %   Monocytes Absolute 1.3 (H) 0.2 - 0.9 K/uL   Eosinophils Relative 6 %   Eosinophils Absolute 0.6 0 - 0.7 K/uL   Basophils Relative 0 %   Basophils Absolute 0.0 0 - 0.1 K/uL  Comprehensive metabolic panel  Result Value Ref Range   Sodium 139 135 - 145 mmol/L   Potassium 4.9 3.5 - 5.1 mmol/L   Chloride 100 (L) 101 - 111 mmol/L   CO2 31 22 - 32 mmol/L   Glucose, Bld 120 (H) 65 - 99 mg/dL   BUN 56 (H) 6 - 20 mg/dL   Creatinine, Ser 2.04 (H) 0.44 - 1.00 mg/dL   Calcium 9.5 8.9 - 10.3 mg/dL   Total Protein 7.3 6.5 - 8.1 g/dL   Albumin 3.3 (L) 3.5 - 5.0 g/dL   AST 19 15 - 41 U/L   ALT 7 (L) 14 - 54 U/L   Alkaline Phosphatase 165 (H) 38 - 126 U/L   Total Bilirubin 0.6 0.3 - 1.2 mg/dL   GFR calc non Af Amer 20 (L) >60 mL/min   GFR calc Af Amer 23 (L) >60 mL/min   Anion gap 8 5 - 15  Troponin I  Result Value Ref Range   Troponin I <0.03 <0.03 ng/mL  Brain natriuretic peptide  Result Value Ref Range   B Natriuretic Peptide 14.0 0.0 - 100.0 pg/mL   Dg Chest 2 View  Result Date: 02/15/2017 CLINICAL DATA:  Short of breath. EXAM: CHEST  2 VIEW COMPARISON:  01/15/2017 . FINDINGS: Mediastinum hilar structures normal. Cardiomegaly with mild bilateral interstitial prominence and bilateral pleural effusions consistent with CHF. Similar findings noted on prior study 01/15/2017. No pneumothorax. Diffuse  osteopenia and degenerative change. Left humeral fracture again noted. IMPRESSION: Findings consistent with congestive heart failure with mild interstitial edema and bilateral small pleural effusions. Electronically Signed   By: Marcello Moores  Register   On: 02/15/2017 17:34

## 2017-02-16 DIAGNOSIS — I10 Essential (primary) hypertension: Secondary | ICD-10-CM | POA: Diagnosis not present

## 2017-02-16 DIAGNOSIS — L97812 Non-pressure chronic ulcer of other part of right lower leg with fat layer exposed: Secondary | ICD-10-CM | POA: Diagnosis not present

## 2017-02-16 DIAGNOSIS — R0902 Hypoxemia: Secondary | ICD-10-CM | POA: Diagnosis not present

## 2017-02-16 DIAGNOSIS — I130 Hypertensive heart and chronic kidney disease with heart failure and stage 1 through stage 4 chronic kidney disease, or unspecified chronic kidney disease: Secondary | ICD-10-CM | POA: Diagnosis not present

## 2017-02-16 DIAGNOSIS — J449 Chronic obstructive pulmonary disease, unspecified: Secondary | ICD-10-CM | POA: Diagnosis not present

## 2017-02-16 DIAGNOSIS — I480 Paroxysmal atrial fibrillation: Secondary | ICD-10-CM | POA: Diagnosis not present

## 2017-02-16 DIAGNOSIS — I2510 Atherosclerotic heart disease of native coronary artery without angina pectoris: Secondary | ICD-10-CM | POA: Diagnosis not present

## 2017-02-16 DIAGNOSIS — I87333 Chronic venous hypertension (idiopathic) with ulcer and inflammation of bilateral lower extremity: Secondary | ICD-10-CM | POA: Diagnosis not present

## 2017-02-16 DIAGNOSIS — I739 Peripheral vascular disease, unspecified: Secondary | ICD-10-CM | POA: Diagnosis not present

## 2017-02-16 DIAGNOSIS — I5032 Chronic diastolic (congestive) heart failure: Secondary | ICD-10-CM | POA: Diagnosis not present

## 2017-02-16 DIAGNOSIS — L97822 Non-pressure chronic ulcer of other part of left lower leg with fat layer exposed: Secondary | ICD-10-CM | POA: Diagnosis not present

## 2017-02-16 DIAGNOSIS — K219 Gastro-esophageal reflux disease without esophagitis: Secondary | ICD-10-CM | POA: Diagnosis not present

## 2017-02-16 DIAGNOSIS — M6281 Muscle weakness (generalized): Secondary | ICD-10-CM | POA: Diagnosis not present

## 2017-02-16 DIAGNOSIS — D649 Anemia, unspecified: Secondary | ICD-10-CM | POA: Diagnosis not present

## 2017-02-18 ENCOUNTER — Ambulatory Visit: Payer: Medicare Other | Admitting: Surgery

## 2017-02-18 ENCOUNTER — Encounter: Payer: Self-pay | Admitting: Emergency Medicine

## 2017-02-18 ENCOUNTER — Inpatient Hospital Stay
Admission: EM | Admit: 2017-02-18 | Discharge: 2017-02-21 | DRG: 291 | Disposition: A | Discharge status: Skilled Nursing Facility | Payer: Medicare Other | Attending: Internal Medicine | Admitting: Internal Medicine

## 2017-02-18 ENCOUNTER — Emergency Department: Payer: Medicare Other

## 2017-02-18 DIAGNOSIS — N184 Chronic kidney disease, stage 4 (severe): Secondary | ICD-10-CM | POA: Diagnosis present

## 2017-02-18 DIAGNOSIS — I2510 Atherosclerotic heart disease of native coronary artery without angina pectoris: Secondary | ICD-10-CM | POA: Diagnosis present

## 2017-02-18 DIAGNOSIS — Z8249 Family history of ischemic heart disease and other diseases of the circulatory system: Secondary | ICD-10-CM | POA: Diagnosis not present

## 2017-02-18 DIAGNOSIS — I130 Hypertensive heart and chronic kidney disease with heart failure and stage 1 through stage 4 chronic kidney disease, or unspecified chronic kidney disease: Principal | ICD-10-CM | POA: Diagnosis present

## 2017-02-18 DIAGNOSIS — Z823 Family history of stroke: Secondary | ICD-10-CM

## 2017-02-18 DIAGNOSIS — Z6821 Body mass index (BMI) 21.0-21.9, adult: Secondary | ICD-10-CM

## 2017-02-18 DIAGNOSIS — E46 Unspecified protein-calorie malnutrition: Secondary | ICD-10-CM | POA: Diagnosis present

## 2017-02-18 DIAGNOSIS — Z885 Allergy status to narcotic agent status: Secondary | ICD-10-CM | POA: Diagnosis not present

## 2017-02-18 DIAGNOSIS — Z7902 Long term (current) use of antithrombotics/antiplatelets: Secondary | ICD-10-CM

## 2017-02-18 DIAGNOSIS — G629 Polyneuropathy, unspecified: Secondary | ICD-10-CM | POA: Diagnosis present

## 2017-02-18 DIAGNOSIS — Z7982 Long term (current) use of aspirin: Secondary | ICD-10-CM

## 2017-02-18 DIAGNOSIS — I5033 Acute on chronic diastolic (congestive) heart failure: Secondary | ICD-10-CM | POA: Diagnosis present

## 2017-02-18 DIAGNOSIS — Z8673 Personal history of transient ischemic attack (TIA), and cerebral infarction without residual deficits: Secondary | ICD-10-CM

## 2017-02-18 DIAGNOSIS — I5031 Acute diastolic (congestive) heart failure: Secondary | ICD-10-CM | POA: Diagnosis not present

## 2017-02-18 DIAGNOSIS — Z66 Do not resuscitate: Secondary | ICD-10-CM | POA: Diagnosis present

## 2017-02-18 DIAGNOSIS — L89152 Pressure ulcer of sacral region, stage 2: Secondary | ICD-10-CM | POA: Diagnosis present

## 2017-02-18 DIAGNOSIS — R0602 Shortness of breath: Secondary | ICD-10-CM | POA: Diagnosis not present

## 2017-02-18 DIAGNOSIS — R05 Cough: Secondary | ICD-10-CM | POA: Diagnosis not present

## 2017-02-18 DIAGNOSIS — I5032 Chronic diastolic (congestive) heart failure: Secondary | ICD-10-CM | POA: Diagnosis present

## 2017-02-18 DIAGNOSIS — J189 Pneumonia, unspecified organism: Secondary | ICD-10-CM | POA: Diagnosis not present

## 2017-02-18 DIAGNOSIS — K219 Gastro-esophageal reflux disease without esophagitis: Secondary | ICD-10-CM | POA: Diagnosis present

## 2017-02-18 DIAGNOSIS — J9610 Chronic respiratory failure, unspecified whether with hypoxia or hypercapnia: Secondary | ICD-10-CM | POA: Diagnosis not present

## 2017-02-18 DIAGNOSIS — R058 Other specified cough: Secondary | ICD-10-CM

## 2017-02-18 DIAGNOSIS — Z9981 Dependence on supplemental oxygen: Secondary | ICD-10-CM | POA: Diagnosis not present

## 2017-02-18 DIAGNOSIS — L8990 Pressure ulcer of unspecified site, unspecified stage: Secondary | ICD-10-CM | POA: Insufficient documentation

## 2017-02-18 DIAGNOSIS — I700 Atherosclerosis of aorta: Secondary | ICD-10-CM | POA: Diagnosis not present

## 2017-02-18 DIAGNOSIS — Z888 Allergy status to other drugs, medicaments and biological substances status: Secondary | ICD-10-CM | POA: Diagnosis not present

## 2017-02-18 DIAGNOSIS — R0600 Dyspnea, unspecified: Secondary | ICD-10-CM | POA: Diagnosis not present

## 2017-02-18 DIAGNOSIS — D638 Anemia in other chronic diseases classified elsewhere: Secondary | ICD-10-CM | POA: Diagnosis present

## 2017-02-18 DIAGNOSIS — Z9849 Cataract extraction status, unspecified eye: Secondary | ICD-10-CM | POA: Diagnosis not present

## 2017-02-18 DIAGNOSIS — R6889 Other general symptoms and signs: Secondary | ICD-10-CM | POA: Diagnosis not present

## 2017-02-18 DIAGNOSIS — M810 Age-related osteoporosis without current pathological fracture: Secondary | ICD-10-CM | POA: Diagnosis present

## 2017-02-18 DIAGNOSIS — R0601 Orthopnea: Secondary | ICD-10-CM

## 2017-02-18 DIAGNOSIS — N179 Acute kidney failure, unspecified: Secondary | ICD-10-CM | POA: Diagnosis present

## 2017-02-18 DIAGNOSIS — Z7401 Bed confinement status: Secondary | ICD-10-CM | POA: Diagnosis not present

## 2017-02-18 DIAGNOSIS — Z800 Family history of malignant neoplasm of digestive organs: Secondary | ICD-10-CM | POA: Diagnosis not present

## 2017-02-18 DIAGNOSIS — Z90710 Acquired absence of both cervix and uterus: Secondary | ICD-10-CM | POA: Diagnosis not present

## 2017-02-18 DIAGNOSIS — J441 Chronic obstructive pulmonary disease with (acute) exacerbation: Secondary | ICD-10-CM | POA: Diagnosis not present

## 2017-02-18 DIAGNOSIS — Z9049 Acquired absence of other specified parts of digestive tract: Secondary | ICD-10-CM

## 2017-02-18 DIAGNOSIS — J439 Emphysema, unspecified: Secondary | ICD-10-CM | POA: Diagnosis present

## 2017-02-18 DIAGNOSIS — L97519 Non-pressure chronic ulcer of other part of right foot with unspecified severity: Secondary | ICD-10-CM | POA: Diagnosis present

## 2017-02-18 DIAGNOSIS — I739 Peripheral vascular disease, unspecified: Secondary | ICD-10-CM | POA: Diagnosis present

## 2017-02-18 DIAGNOSIS — J449 Chronic obstructive pulmonary disease, unspecified: Secondary | ICD-10-CM | POA: Diagnosis not present

## 2017-02-18 LAB — URINALYSIS, COMPLETE (UACMP) WITH MICROSCOPIC
BACTERIA UA: NONE SEEN
BILIRUBIN URINE: NEGATIVE
Glucose, UA: NEGATIVE mg/dL
Hgb urine dipstick: NEGATIVE
Ketones, ur: NEGATIVE mg/dL
Leukocytes, UA: NEGATIVE
Nitrite: NEGATIVE
PROTEIN: 30 mg/dL — AB
Specific Gravity, Urine: 1.01 (ref 1.005–1.030)
Squamous Epithelial / LPF: NONE SEEN
pH: 6 (ref 5.0–8.0)

## 2017-02-18 LAB — COMPREHENSIVE METABOLIC PANEL
ALK PHOS: 131 U/L — AB (ref 38–126)
ALT: 7 U/L — ABNORMAL LOW (ref 14–54)
AST: 21 U/L (ref 15–41)
Albumin: 3.3 g/dL — ABNORMAL LOW (ref 3.5–5.0)
Anion gap: 8 (ref 5–15)
BILIRUBIN TOTAL: 0.4 mg/dL (ref 0.3–1.2)
BUN: 62 mg/dL — ABNORMAL HIGH (ref 6–20)
CALCIUM: 9.4 mg/dL (ref 8.9–10.3)
CO2: 30 mmol/L (ref 22–32)
Chloride: 99 mmol/L — ABNORMAL LOW (ref 101–111)
Creatinine, Ser: 2.09 mg/dL — ABNORMAL HIGH (ref 0.44–1.00)
GFR calc non Af Amer: 19 mL/min — ABNORMAL LOW (ref >60)
GFR, EST AFRICAN AMERICAN: 23 mL/min — AB (ref >60)
Glucose, Bld: 92 mg/dL (ref 65–99)
Potassium: 4.6 mmol/L (ref 3.5–5.1)
SODIUM: 137 mmol/L (ref 135–145)
TOTAL PROTEIN: 7.1 g/dL (ref 6.5–8.1)

## 2017-02-18 LAB — TROPONIN I: Troponin I: <0.03 ng/mL (ref <0.03)

## 2017-02-18 LAB — EXPECTORATED SPUTUM ASSESSMENT W REFEX TO RESP CULTURE: SPECIAL REQUESTS: NORMAL

## 2017-02-18 LAB — CBC
HCT: 27.9 % — ABNORMAL LOW (ref 35.0–47.0)
HEMOGLOBIN: 8.9 g/dL — AB (ref 12.0–16.0)
MCH: 32 pg (ref 26.0–34.0)
MCHC: 32 g/dL (ref 32.0–36.0)
MCV: 100.1 fL — ABNORMAL HIGH (ref 80.0–100.0)
Platelets: 208 10*3/uL (ref 150–440)
RBC: 2.79 MIL/uL — ABNORMAL LOW (ref 3.80–5.20)
RDW: 15 % — AB (ref 11.5–14.5)
WBC: 13.5 10*3/uL — ABNORMAL HIGH (ref 3.6–11.0)

## 2017-02-18 LAB — BRAIN NATRIURETIC PEPTIDE: B Natriuretic Peptide: 637 pg/mL — ABNORMAL HIGH (ref 0.0–100.0)

## 2017-02-18 LAB — MRSA PCR SCREENING: MRSA BY PCR: POSITIVE — AB

## 2017-02-18 LAB — EXPECTORATED SPUTUM ASSESSMENT W GRAM STAIN, RFLX TO RESP C

## 2017-02-18 MED ORDER — FUROSEMIDE 10 MG/ML IJ SOLN
60.0000 mg | Freq: Every day | INTRAMUSCULAR | Status: DC
  Administered 2017-02-18 – 2017-02-19 (×2): 60 mg via INTRAVENOUS
  Filled 2017-02-18 (×2): qty 6

## 2017-02-18 MED ORDER — DICLOFENAC EPOLAMINE 1.3 % TD PTCH
1.0000 | MEDICATED_PATCH | Freq: Two times a day (BID) | TRANSDERMAL | Status: DC
  Administered 2017-02-18 – 2017-02-21 (×7): 1 via TRANSDERMAL
  Filled 2017-02-18 (×5): qty 1

## 2017-02-18 MED ORDER — POLYETHYLENE GLYCOL 3350 17 G PO PACK
17.0000 g | PACK | Freq: Every day | ORAL | Status: DC
  Administered 2017-02-18 – 2017-02-21 (×4): 17 g via ORAL
  Filled 2017-02-18 (×4): qty 1

## 2017-02-18 MED ORDER — DICYCLOMINE HCL 20 MG PO TABS
20.0000 mg | ORAL_TABLET | Freq: Three times a day (TID) | ORAL | Status: DC | PRN
  Filled 2017-02-18: qty 1

## 2017-02-18 MED ORDER — ALPRAZOLAM 0.25 MG PO TABS
0.2500 mg | ORAL_TABLET | Freq: Two times a day (BID) | ORAL | Status: DC | PRN
  Administered 2017-02-18 – 2017-02-20 (×3): 0.25 mg via ORAL
  Filled 2017-02-18 (×3): qty 1

## 2017-02-18 MED ORDER — BUDESONIDE 0.5 MG/2ML IN SUSP
0.5000 mg | Freq: Two times a day (BID) | RESPIRATORY_TRACT | Status: DC
  Administered 2017-02-18 – 2017-02-21 (×7): 0.5 mg via RESPIRATORY_TRACT
  Filled 2017-02-18 (×8): qty 2

## 2017-02-18 MED ORDER — LORATADINE 10 MG PO TABS
10.0000 mg | ORAL_TABLET | Freq: Every day | ORAL | Status: DC
  Administered 2017-02-19 – 2017-02-21 (×3): 10 mg via ORAL
  Filled 2017-02-18 (×3): qty 1

## 2017-02-18 MED ORDER — POLYETHYLENE GLYCOL 3350 17 G PO PACK: 17.0000 g | PACK | Freq: Every day | ORAL | Status: DC

## 2017-02-18 MED ORDER — BOOST HIGH PROTEIN PO LIQD
1.0000 | Freq: Every day | ORAL | Status: DC
  Administered 2017-02-18 – 2017-02-21 (×4): 237 mL via ORAL
  Filled 2017-02-18: qty 237

## 2017-02-18 MED ORDER — SENNOSIDES-DOCUSATE SODIUM 8.6-50 MG PO TABS
2.0000 | ORAL_TABLET | Freq: Every day | ORAL | Status: DC
  Administered 2017-02-19 – 2017-02-21 (×3): 2 via ORAL
  Filled 2017-02-18 (×3): qty 2

## 2017-02-18 MED ORDER — GABAPENTIN 300 MG PO CAPS
300.0000 mg | ORAL_CAPSULE | Freq: Three times a day (TID) | ORAL | Status: DC
  Administered 2017-02-18 – 2017-02-21 (×9): 300 mg via ORAL
  Filled 2017-02-18 (×11): qty 1

## 2017-02-18 MED ORDER — ACETAMINOPHEN 650 MG RE SUPP: 650.0000 mg | Freq: Four times a day (QID) | RECTAL | Status: DC | PRN

## 2017-02-18 MED ORDER — FLUTICASONE PROPIONATE 50 MCG/ACT NA SUSP
1.0000 | Freq: Every day | NASAL | Status: DC
  Administered 2017-02-18 – 2017-02-21 (×4): 1 via NASAL
  Filled 2017-02-18: qty 16

## 2017-02-18 MED ORDER — CLOPIDOGREL BISULFATE 75 MG PO TABS
75.0000 mg | ORAL_TABLET | Freq: Every day | ORAL | Status: DC
  Administered 2017-02-18 – 2017-02-21 (×4): 75 mg via ORAL
  Filled 2017-02-18 (×4): qty 1

## 2017-02-18 MED ORDER — VITAMIN B-12 1000 MCG PO TABS
1000.0000 ug | ORAL_TABLET | Freq: Every day | ORAL | Status: DC
  Administered 2017-02-19 – 2017-02-21 (×3): 1000 ug via ORAL
  Filled 2017-02-18 (×3): qty 1

## 2017-02-18 MED ORDER — IPRATROPIUM-ALBUTEROL 0.5-2.5 (3) MG/3ML IN SOLN
3.0000 mL | Freq: Once | RESPIRATORY_TRACT | Status: AC
  Administered 2017-02-18: 3 mL via RESPIRATORY_TRACT
  Filled 2017-02-18: qty 3

## 2017-02-18 MED ORDER — ONDANSETRON HCL 4 MG PO TABS
4.0000 mg | ORAL_TABLET | Freq: Four times a day (QID) | ORAL | Status: DC | PRN
  Filled 2017-02-18: qty 1

## 2017-02-18 MED ORDER — FAMOTIDINE 20 MG PO TABS
20.0000 mg | ORAL_TABLET | Freq: Two times a day (BID) | ORAL | Status: DC
  Administered 2017-02-18 – 2017-02-21 (×6): 20 mg via ORAL
  Filled 2017-02-18 (×6): qty 1

## 2017-02-18 MED ORDER — METHYLPREDNISOLONE SODIUM SUCC 40 MG IJ SOLR
40.0000 mg | Freq: Every day | INTRAMUSCULAR | Status: DC
  Administered 2017-02-18 – 2017-02-21 (×4): 40 mg via INTRAVENOUS
  Filled 2017-02-18 (×4): qty 1

## 2017-02-18 MED ORDER — ENOXAPARIN SODIUM 40 MG/0.4ML ~~LOC~~ SOLN: 40.0000 mg | SUBCUTANEOUS | Status: DC

## 2017-02-18 MED ORDER — CARVEDILOL 12.5 MG PO TABS
12.5000 mg | ORAL_TABLET | Freq: Two times a day (BID) | ORAL | Status: DC
  Administered 2017-02-18 – 2017-02-19 (×4): 12.5 mg via ORAL
  Filled 2017-02-18 (×5): qty 1

## 2017-02-18 MED ORDER — NITROGLYCERIN 0.2 MG/HR TD PT24
0.2000 mg | MEDICATED_PATCH | Freq: Every day | TRANSDERMAL | Status: DC
  Administered 2017-02-18 – 2017-02-21 (×4): 0.2 mg via TRANSDERMAL
  Filled 2017-02-18 (×4): qty 1

## 2017-02-18 MED ORDER — TRAMADOL HCL 50 MG PO TABS
50.0000 mg | ORAL_TABLET | ORAL | Status: DC | PRN
Start: 2017-02-18 — End: 2017-02-21
  Administered 2017-02-18 – 2017-02-19 (×2): 50 mg via ORAL
  Filled 2017-02-18 (×2): qty 1

## 2017-02-18 MED ORDER — ONDANSETRON HCL 4 MG/2ML IJ SOLN: 4.0000 mg | Freq: Four times a day (QID) | INTRAMUSCULAR | Status: DC | PRN

## 2017-02-18 MED ORDER — LEVOFLOXACIN IN D5W 500 MG/100ML IV SOLN
500.0000 mg | INTRAVENOUS | Status: DC
  Administered 2017-02-20: 500 mg via INTRAVENOUS
  Filled 2017-02-18: qty 100

## 2017-02-18 MED ORDER — TRIPLE ANTIBIOTIC 3.5-400-5000 EX OINT
1.0000 "application " | TOPICAL_OINTMENT | Freq: Every day | CUTANEOUS | Status: DC
  Administered 2017-02-19 – 2017-02-21 (×3): 1 via TOPICAL
  Filled 2017-02-18 (×4): qty 1

## 2017-02-18 MED ORDER — IPRATROPIUM-ALBUTEROL 0.5-2.5 (3) MG/3ML IN SOLN
3.0000 mL | Freq: Four times a day (QID) | RESPIRATORY_TRACT | Status: DC
  Administered 2017-02-18 – 2017-02-21 (×12): 3 mL via RESPIRATORY_TRACT
  Filled 2017-02-18 (×11): qty 3

## 2017-02-18 MED ORDER — RISAQUAD PO CAPS
1.0000 | ORAL_CAPSULE | Freq: Every day | ORAL | Status: DC
  Administered 2017-02-19 – 2017-02-21 (×3): 1 via ORAL
  Filled 2017-02-18 (×3): qty 1

## 2017-02-18 MED ORDER — LEVOFLOXACIN IN D5W 750 MG/150ML IV SOLN
750.0000 mg | Freq: Once | INTRAVENOUS | Status: AC
  Administered 2017-02-18: 750 mg via INTRAVENOUS
  Filled 2017-02-18: qty 150

## 2017-02-18 MED ORDER — ACETAMINOPHEN 325 MG PO TABS: 650.0000 mg | ORAL_TABLET | Freq: Four times a day (QID) | ORAL | Status: DC | PRN

## 2017-02-18 MED ORDER — ENOXAPARIN SODIUM 30 MG/0.3ML ~~LOC~~ SOLN
30.0000 mg | SUBCUTANEOUS | Status: DC
  Administered 2017-02-18 – 2017-02-20 (×3): 30 mg via SUBCUTANEOUS
  Filled 2017-02-18 (×3): qty 0.3

## 2017-02-18 MED ORDER — MUPIROCIN CALCIUM 2 % EX CREA
TOPICAL_CREAM | Freq: Two times a day (BID) | CUTANEOUS | Status: DC
  Administered 2017-02-18 – 2017-02-21 (×7): via TOPICAL
  Filled 2017-02-18: qty 15

## 2017-02-18 MED ORDER — ASPIRIN EC 81 MG PO TBEC
81.0000 mg | DELAYED_RELEASE_TABLET | Freq: Every day | ORAL | Status: DC
  Administered 2017-02-19 – 2017-02-21 (×3): 81 mg via ORAL
  Filled 2017-02-18 (×3): qty 1

## 2017-02-18 MED ORDER — POTASSIUM CHLORIDE CRYS ER 10 MEQ PO TBCR
10.0000 meq | EXTENDED_RELEASE_TABLET | Freq: Every day | ORAL | Status: DC
  Administered 2017-02-19 – 2017-02-21 (×3): 10 meq via ORAL
  Filled 2017-02-18 (×4): qty 1

## 2017-02-18 NOTE — Care Management Note (Addendum)
Case Management Note  Patient Details  Name: [name] [name] MRN: [id number] Date of Birth: 1923-10-07  Subjective/Objective:                 Admitted from [facility] with exacerbation of COPD.  Has chronic oxygen through [facility] and has been followed by [facility].  Action/Plan:   Notified [facility] of admission and the need to resume services at discharge.  Expected Discharge Date:                  Expected Discharge Plan:     In-House Referral:     Discharge planning Services     Post Acute Care Choice:    Choice offered to:     DME Arranged:    DME Agency:     HH Arranged:    HH Agency:     Status of Service:     If discussed at [employer] [facility] [employer], dates discussed:    Additional Comments:  Katrina Stack, RN 02/18/2017, 4:05 PM

## 2017-02-18 NOTE — Progress Notes (Signed)
Lovenox dose adjusted to 30mg  q24hr based on patients est CrCl of 16 ml/min Charlane Ferretti, [place]

## 2017-02-18 NOTE — Consult Note (Addendum)
[facility] Nurse wound consult note Reason for Consult:nonhealing chronic wounds to left anterior pretibial leg and left dorsal foot. [place] has recently discontinued use of compression.  Nonhealing lesion to right third metatarsal. Stage 2 sacral pressure injury Wound type:Vascular Pressure Injury POA: Yes Measurement: right toe:  0.5 cm lesion  Scattered nonintact lesions to left anterior lower leg.   Wound DGU:YQIHK red Drainage (amount, consistency, odor) Minimal serosanguinous  No odor.  Periwound:intact Dressing procedure/placement/frequency:Cleanse wounds to left lower leg and right toe with NS and pat gently dry.  Apply mupirocin cream to wound bed.  Cover with 4x4 and kerlix/tape.  Change daily.  Allevyn sacral dressing.  Change every 3 days and PRN soilage.  Will not follow at this time.  Please re-consult if needed.  Domenic Moras RN BSN [place] Pager [phone]

## 2017-02-18 NOTE — H&P (Signed)
[facility] [place] NAME: [name] [name]    MR#:  [id number]  DATE OF BIRTH:  [birth year]  DATE OF ADMISSION:  02/18/2017  PRIMARY CARE PHYSICIAN: Bonnita Nasuti, MD   REQUESTING/REFERRING PHYSICIAN: Dr Eula Listen  CHIEF COMPLAINT:   Chief Complaint  Patient presents with  . Shortness of Breath    HISTORY OF PRESENT ILLNESS:  [name] [name]  is a 83 y.o. female with a known history of CHF, COPD on chronic oxygen. She presents to the ER with shortness of breath. She was recently in the ER and treated and sent home on antibiotic and steroid. She was doing okay yesterday but last night had a lot worse shortness of breath. She is unable to lie flat. She is coughing up brownish green phlegm. She states that she's been struggling to breathe. She also gets a choking feeling about her swallowing is okay. She's been very fatigued and cold. In the ER, she wasn't moving much air and lots of wheezing. Hospitalist services were contacted for further evaluation.  PAST MEDICAL HISTORY:   Past Medical History:  Diagnosis Date  . Anemia   . CHF (congestive heart failure) ([place])   . Coronary artery disease   . Emphysema lung ([place])   . Emphysema of lung ([place])   . GERD (gastroesophageal reflux disease)   . Hyperlipidemia   . Hypertension   . Neuropathy   . Osteoporosis   . Peripheral vascular disease ([place])   . Renal disorder   . Stroke [facility])     PAST SURGICAL HISTORY:   Past Surgical History:  Procedure Laterality Date  . ABDOMINAL HYSTERECTOMY  1982  . CATARACT EXTRACTION    . CHOLECYSTECTOMY  1992  . JOINT REPLACEMENT  2005    SOCIAL HISTORY:   Social History  Substance Use Topics  . Smoking status: Never Smoker  . Smokeless tobacco: Never Used  . Alcohol use No    FAMILY HISTORY:   Family History  Problem Relation Age of Onset  . Colon cancer Sister   . CVA Mother   . CAD Mother   . Hypertension Mother      DRUG ALLERGIES:   Allergies  Allergen Reactions  . Cholesterol Other (See Comments)    Patient states she is allergic to a cholesterol medication, which causes flu-like symptoms. Name is unknown.  . Morphine And Related     Heart problems   . Zoloft [Sertraline Hcl] Swelling    REVIEW OF SYSTEMS:  CONSTITUTIONAL: No fever. Positive for fatigue and weakness. Positive for cold feeling EYES: No blurred or double vision. Wears glasses and has dry eyes. EARS, NOSE, AND THROAT: No tinnitus or ear pain. Positive for runny nose. Positive for scratchy throat. Decreased hearing and wears hearing aids RESPIRATORY: Positive for cough and greenish brown phlegm. Positive for shortness of breath. Positive for wheezing.  CARDIOVASCULAR: No chest pain, positive for decreased appetite. edema.  GASTROINTESTINAL: No nausea, vomiting, or abdominal pain. Patient has bleeding hemorrhoids occasionally. Normally has constipation but occasionally has some diarrhea. GENITOURINARY: No dysuria, hematuria.  ENDOCRINE: No polyuria, nocturia,  HEMATOLOGY: History of anemia, positive for easy bruising or bleeding SKIN: Just developed a skin breakdown on the buttock. Left lower extremity chronic wounds followed at the wound care center. Right second toe ulceration. Dry skin MUSCULOSKELETAL: No joint pain or arthritis.   NEUROLOGIC: No tingling, numbness, weakness.  PSYCHIATRY: History of anxiety and depression.   MEDICATIONS AT HOME:  Prior to Admission medications   Medication Sig Start Date End Date Taking? Authorizing Provider  aspirin 81 MG tablet Take 81 mg by mouth daily.   Yes Historical Provider, MD  azithromycin (ZITHROMAX Z-PAK) 250 MG tablet Take 2 tablets (500 mg) on  Day 1,  followed by 1 tablet (250 mg) once daily on Days 2 through 5. 02/15/17 02/20/17 Yes Darel Hong, MD  carvedilol (COREG) 12.5 MG tablet Take 1 tablet (12.5 mg total) by mouth 2 (two) times daily. 10/06/16 10/06/17 Yes Schuyler Amor, MD  Cholecalciferol 2000 units CAPS Take 4,000 Units by mouth daily.    Yes Historical Provider, MD  clopidogrel (PLAVIX) 75 MG tablet Take 1 tablet by mouth daily. 09/18/13  Yes Historical Provider, MD  cyanocobalamin 1000 MCG tablet Take 1,000 mcg by mouth daily.    Yes Historical Provider, MD  FLECTOR 1.3 % PTCH Place 1 patch onto the skin every 12 (twelve) hours.  09/18/13  Yes Historical Provider, MD  furosemide (LASIX) 20 MG tablet Take 1 tablet (20 mg total) by mouth daily. Patient taking differently: Take 20 mg by mouth every other day.  10/06/16  Yes Schuyler Amor, MD  gabapentin (NEURONTIN) 300 MG capsule Take 1 capsule by mouth 3 (three) times daily.  09/04/13  Yes Historical Provider, MD  loratadine (CLARITIN) 10 MG tablet Take 10 mg by mouth daily.   Yes Historical Provider, MD  nitroGLYCERIN (NITRODUR - DOSED IN MG/24 HR) 0.2 mg/hr patch Place 1 patch onto the skin daily.  09/18/13  Yes Historical Provider, MD  polyethylene glycol (MIRALAX / GLYCOLAX) packet Take 17 g by mouth daily.   Yes Historical Provider, MD  potassium chloride SA (K-DUR,KLOR-CON) 10 MEQ tablet Take 1 tablet (10 mEq total) by mouth daily. 11/09/16  Yes [name], MD  predniSONE (DELTASONE) 10 MG tablet Take 5 tablets (50 mg total) by mouth daily. 02/15/17 02/19/17 Yes Darel Hong, MD  ranitidine (ZANTAC) 150 MG capsule Take 150 mg by mouth 2 (two) times daily.   Yes Historical Provider, MD  sennosides-docusate sodium (SENOKOT-S) 8.6-50 MG tablet Take 2 tablets by mouth daily.   Yes Historical Provider, MD  acetaminophen (TYLENOL) 325 MG tablet Take 650 mg by mouth every 6 (six) hours as needed for mild pain or moderate pain.    Historical Provider, MD  albuterol (PROVENTIL HFA;VENTOLIN HFA) 108 (90 Base) MCG/ACT inhaler Inhale 2 puffs into the lungs every 6 (six) hours as needed for wheezing or shortness of breath. 02/15/17   Darel Hong, MD  ALPRAZolam Duanne Moron) 0.25 MG tablet Take 0.25 mg by  mouth 2 (two) times daily as needed for anxiety or agitation. 11/03/16   Historical Provider, MD  celecoxib (CELEBREX) 100 MG capsule Take 100 mg by mouth every other day as needed. 10/15/16   Historical Provider, MD  dicyclomine (BENTYL) 20 MG tablet Take 1 tablet (20 mg total) by mouth 3 (three) times daily as needed for spasms. 11/28/15   Daymon Larsen, MD  feeding supplement (BOOST HIGH PROTEIN) LIQD Take 1 Container by mouth daily.    Historical Provider, MD  neomycin-bacitracin-polymyxin (NEOSPORIN) [phone] ointment Apply 1 application topically daily.    Historical Provider, MD  ondansetron (ZOFRAN) 4 MG tablet Take 1 tablet (4 mg total) by mouth every 8 (eight) hours as needed for nausea or vomiting. 11/28/15   Daymon Larsen, MD  Probiotic Product (ALIGN) 4 MG CAPS Take 4 mg by mouth daily.    Historical Provider, MD  Spacer/Aero-Hold Chamber Mask MISC 1 Units by Does not apply route every 6 (six) hours as needed. 02/15/17   Darel Hong, MD  traMADol (ULTRAM) 50 MG tablet Take 50 mg by mouth every 4 (four) hours as needed for moderate pain or severe pain.    Historical Provider, MD      VITAL SIGNS:  Blood pressure (!) 167/60, pulse 68, temperature 98.1 F (36.7 C), temperature source Oral, resp. rate 18, height 5\' 6"  (1.676 m), weight 60.8 kg (134 lb), SpO2 100 %.  PHYSICAL EXAMINATION:  GENERAL:  83 y.o.-year-old patient Sitting in the bed with no acute distress.  EYES: Pupils equal, round, reactive to light and accommodation. No scleral icterus. Extraocular muscles intact.  HEENT: Head atraumatic, normocephalic. Oropharynx and nasopharynx clear.  NECK:  Supple, no jugular venous distention. No thyroid enlargement, no tenderness.  LUNGS: Decreased breath sounds bilaterally, positive inspiratory and expiratory wheezing throughout entire lung field. No rales,rhonchi or crepitation. No use of accessory muscles of respiration.  CARDIOVASCULAR: S1, S2 normal. 2 out of 6 systolic  murmur. No rubs, or gallops.  ABDOMEN: Soft, nontender, nondistended. Bowel sounds present. No organomegaly or mass.  EXTREMITIES: No pedal edema, cyanosis, or clubbing.  NEUROLOGIC: Cranial nerves II through XII are intact. Muscle strength 5/5 in all extremities. Sensation intact. Gait not checked.  PSYCHIATRIC: The patient is alert and oriented x 3.  SKIN: Sacral area slight skin breakdown and redness of the skin graded at a stage II decubitus ulcer. Right second toe small ulceration. Left lower extremity; Unna boot cut off one nickel size ulceration on the top of the foot. Numerous longer ulcerations on the anterior shin.  LABORATORY PANEL:   CBC  Recent Labs Lab 02/18/17 0901  WBC 13.5*  HGB 8.9*  HCT 27.9*  PLT 208   ------------------------------------------------------------------------------------------------------------------  Chemistries   Recent Labs Lab 02/18/17 0901  NA 137  K 4.6  CL 99*  CO2 30  GLUCOSE 92  BUN 62*  CREATININE 2.09*  CALCIUM 9.4  AST 21  ALT 7*  ALKPHOS 131*  BILITOT 0.4   ------------------------------------------------------------------------------------------------------------------  Cardiac Enzymes  Recent Labs Lab 02/18/17 0901  TROPONINI <0.03   ------------------------------------------------------------------------------------------------------------------  RADIOLOGY:  Dg Chest 2 View  Result Date: 02/18/2017 CLINICAL DATA:  Shortness of breath. EXAM: CHEST  2 VIEW COMPARISON:  Radiographs of February 15, 2017. FINDINGS: Stable cardiomediastinal silhouette. Atherosclerosis of thoracic aorta is noted. No pneumothorax is noted. Stable mild bibasilar interstitial densities are noted which may represent scarring, but superimposed edema cannot be excluded. Minimal right pleural effusion is noted. Stable mild central pulmonary vascular congestion is noted. Bony thorax is unremarkable. IMPRESSION: Aortic atherosclerosis. Stable mild  bibasilar interstitial densities are noted which may represent scarring, but superimposed edema cannot be excluded. Minimal right pleural effusion. Stable mild central pulmonary vascular congestion. Electronically Signed   By: Marijo Conception, M.D.   On: 02/18/2017 09:40    EKG:   Normal sinus rhythm 73 bpm RSR prime  IMPRESSION AND PLAN:   1. COPD exacerbation in a patient with chronic respiratory failure. Continue 2 L of oxygen. Start Solu-Medrol 40 mg daily. Added budesonide nebulizers and DuoNeb nebulizers. Levaquin given in the emergency room. Sputum culture. 2. Acute on chronic diastolic congestive heart failure. IV Lasix 40 mg IV daily. Patient already on Coreg. 3. Acute kidney injury on chronic kidney disease stage III. Watch closely with diuresis. 4. Left humerus fracture in brace 5. Left lower extremity chronic ulcerations. Right second toe ulceration.  Stage II sacral decubiti. Local wound care. Will have the wound care nurse evaluate and make recommendations. Asked the nurse in the ER just cover with nonstick dressing at this point.  6. History of stroke, history of peripheral vascular disease and history of CAD on aspirin and Plavix and Coreg. Patient with intolerance to statins 7. Neuropathy on gabapentin 8. GERD on H2 blocker 9. Anemia. Likely of chronic disease will send off a ferritin  All the records are reviewed and case discussed with ED provider. Management plans discussed with the patient, family and they are in agreement.  CODE STATUS: DO NOT RESUSCITATE  TOTAL TIME TAKING CARE OF THIS PATIENT: 55 minutes.    Loletha Grayer M.D on 02/18/2017 at 10:54 AM  Between 7am to 6pm - Pager - [phone]  After 6pm call admission pager [phone]  [facility]  [phone]  CC: Primary care physician; Bonnita Nasuti, MD

## 2017-02-18 NOTE — Progress Notes (Signed)
Patient ID: [name] [name], female   DOB: May 14, 1923, 83 y.o.   MRN: [id number] ACP Note  Patient in healthcare power of attorney present.  Diagnosis COPD exacerbation, chronic respiratory failure, acute on chronic diastolic congestive heart failure, acute kidney injury on chronic kidney disease stage III.  I discussed CODE STATUS with the patient. The patient wishes to be a DO NOT RESUSCITATE. DO NOT RESUSCITATE order placed in the computer.  Time spent on ACP discussion 17 minutes  Dr. Loletha Grayer

## 2017-02-18 NOTE — ED Notes (Signed)
Pt requesting PRN nasal spray, states she uses one at home.  Dr. Leslye Peer made aware.

## 2017-02-18 NOTE — ED Notes (Addendum)
Dr. Earleen Newport at bedside, states, "just take it out if it's not giving you any more."  Insufficient blood volume in second set of cultures due to this reason.

## 2017-02-18 NOTE — Clinical Social Work Note (Signed)
CSW was informed that patient is from [facility].  CSW following patient throughout discharge planning.  Jones Broom. [place], MSW, [place]  02/18/2017 2:50 PM

## 2017-02-18 NOTE — Progress Notes (Signed)
Pharmacy Antibiotic Note  [name] [name] is a 83 y.o. female admitted on 02/18/2017 with pneumonia.  Pharmacy has been consulted for levofloxacin dosing.  Plan: Levofloxacin 750mg  IV x1 given in the ED. Will give levofloxacin 500mg  Q48hr starting 5/5 based on patient CrCl of 16 ml/min.   Height: 5\' 6"  (167.6 cm) Weight: 134 lb (60.8 kg) IBW/kg (Calculated) : 59.3  Temp (24hrs), Avg:98.1 F (36.7 C), Min:98.1 F (36.7 C), Max:98.1 F (36.7 C)   Recent Labs Lab 02/15/17 1710 02/18/17 0901  WBC 8.7 13.5*  CREATININE 2.04* 2.09*    Estimated Creatinine Clearance: 16.1 mL/min (A) (by C-G formula based on SCr of 2.09 mg/dL (H)).    Allergies  Allergen Reactions  . Cholesterol Other (See Comments)    Patient states she is allergic to a cholesterol medication, which causes flu-like symptoms. Name is unknown.  . Morphine And Related     Heart problems   . Zoloft [Sertraline Hcl] Swelling    Antimicrobials this admission: Levofloxacin 5/3 >>   Microbiology results: 5/3 BCx: sent  5/3 UCx: sent  5/3 Sputum: sent    Thank you for allowing pharmacy to be a part of this patient's care.  Loree Fee, PharmD 02/18/2017 11:55 AM

## 2017-02-18 NOTE — ED Notes (Signed)
Attempted to call report, RN and Charge RN tied up in pt rooms at this time.  Name and ascom number left to receive call back.

## 2017-02-18 NOTE — Progress Notes (Signed)
PT Cancellation Note  Patient Details Name: [name] [name] MRN: [id number] DOB: 28-Sep-1923   Cancelled Treatment:    Reason Eval/Treat Not Completed: Patient declined, no reason specified Pt reports she does not feel well enough to work with PT today and even says "I can hardly do anything anyway, I don't know what we're going to do." Pt with L shoulder fx (11/18) wearing brace, she states she had tried to use a hemiwalker unsuccessfully and has essentially not done any "walking" since her fall in November.  Kreg Shropshire, DPT 02/18/2017, 2:17 PM

## 2017-02-18 NOTE — ED Notes (Signed)
Pt offered toileting, states she does not have to urinate at this time.  Advised to let me know when she has to go so we can obtain urine specimen.

## 2017-02-18 NOTE — ED Triage Notes (Signed)
Pt in via EMS from [facility] with complaints of shortness of breath upon waking this morning.  Pt with hx of COPD, on 2L O2 chronically.  Pt with recent hospital visit, placed on Zpack and Prednisone.  Pt A/Ox4, hypertensive, 100% on 2L.  EMS gave one duoneb en route.  NAD noted at this time.

## 2017-02-18 NOTE — ED Notes (Signed)
Patient transported to X-ray 

## 2017-02-18 NOTE — Progress Notes (Signed)
Patient alert and oriented, vss, no complaints of pain. Afib on monitor.  Daughter at bedside.  Report given to Raquel Sarna, RN.

## 2017-02-18 NOTE — ED Provider Notes (Signed)
[facility] Emergency Department Provider Note  ____________________________________________  Time seen: Approximately 10:02 AM  I have reviewed the triage vital signs and the nursing notes.   HISTORY  Chief Complaint Shortness of Breath    HPI [name] [name] is a 83 y.o. female with a history of COPD on 2 L nasal cannula, CAD, CHF, HTN, HL, COPD, presenting with shortness of breath. The patient was seen here 02/15/17 for shortness of breath and cough, received diuresis while in the emergency department, discharged home with azithromycin and prednisone COPD exacerbation. She has been doing fairly well until last night when she became more short of breath. She noted that it was worse when she laid flat. She is wheelchair bound but has noticed that exertion leads to increased fatigue and shortness of breath. Her cough is productive of a yellow sputum. She has not had any chest pain, palpitations, lightheadedness or syncope. She has not had any fever or chills, congestion or rhinorrhea, or ear pain. Last week she had a scratchy throat, but this has resolved. No nausea, vomiting, or diarrhea. She does not follow her daily weights, but has not noted any increased lower extremity edema.   Past Medical History:  Diagnosis Date  . Anemia   . CHF (congestive heart failure) ([place])   . Coronary artery disease   . Emphysema lung ([place])   . Emphysema of lung ([place])   . GERD (gastroesophageal reflux disease)   . Hyperlipidemia   . Hypertension   . Neuropathy   . Osteoporosis   . Peripheral vascular disease ([place])   . Renal disorder   . Stroke [facility])     Patient Active Problem List   Diagnosis Date Noted  . Chronic diastolic heart failure ([place]) 12/15/2016  . HTN (hypertension) 12/15/2016  . Hypotension 11/16/2016  . CAP (community acquired pneumonia) 11/07/2016  . Generalized anxiety disorder 09/26/2016  . PAD (peripheral artery disease) ([place]) 10/05/2013  . Chronic  venous insufficiency 10/05/2013    Past Surgical History:  Procedure Laterality Date  . ABDOMINAL HYSTERECTOMY  1982  . CATARACT EXTRACTION    . CHOLECYSTECTOMY  1992  . JOINT REPLACEMENT  2005    Current Outpatient Rx  . Order #: [id number] Class: Historical Med  . Order #: [id number] Class: Print  . Order #: [id number] Class: Historical Med  . Order #: [id number] Class: Historical Med  . Order #: [id number] Class: Print  . Order #: [id number] Class: Print  . Order #: [id number] Class: Historical Med  . Order #: [id number] Class: Historical Med  . Order #: [id number] Class: Historical Med  . Order #: [id number] Class: Historical Med  . Order #: [id number] Class: Print  . Order #: [id number] Class: Historical Med  . Order #: [id number] Class: Historical Med  . Order #: [id number] Class: Print  . Order #: [id number] Class: Historical Med  . Order #: [id number] Class: Historical Med  . Order #: [id number] Class: Historical Med  . Order #: [id number] Class: Historical Med  . Order #: [id number] Class: Print  . Order #: [id number] Class: Historical Med  . Order #: [id number] Class: Print  . Order #: [id number] Class: Print  . Order #: [id number] Class: Historical Med  . Order #: [id number] Class: Historical Med  . Order #: [id number] Class: Historical Med  . Order #: [id number] Class: Print  . Order #: [id number] Class: Historical Med    Allergies Cholesterol; Morphine and related; and Zoloft [sertraline hcl]  Family History  Problem Relation Age of Onset  . Colon cancer Sister     Social History Social History  Substance Use Topics  . Smoking  status: Never Smoker  . Smokeless tobacco: Never Used  . Alcohol use No    Review of Systems Constitutional: No fever/chills.No lightheadedness or syncope. Positive decreased exercise tolerance. Eyes: No visual changes. ENT: Positive scratchy throat, now resolved. No ear pain. No congestion or rhinorrhea. Cardiovascular: Denies chest pain. Denies palpitations. Respiratory:  Positive shortness of breath.  Positive productive cough. Positive orthopnea. Gastrointestinal: No abdominal pain.  No nausea, no vomiting.  No diarrhea.  No constipation. Genitourinary: Negative for dysuria. Musculoskeletal: Negative for back pain. Skin: Negative for rash. Neurological: Negative for headaches. No focal numbness, tingling or weakness.   10-point ROS otherwise negative.  ____________________________________________   PHYSICAL EXAM:  VITAL SIGNS: ED Triage Vitals  Enc Vitals Group     BP 02/18/17 0905 (!) 178/54     Pulse Rate 02/18/17 0905 71     Resp 02/18/17 0905 14     Temp 02/18/17 0905 98.1 F (36.7 C)     Temp Source 02/18/17 0905 Oral     SpO2 02/18/17 0858 100 %     Weight 02/18/17 0859 134 lb (60.8 kg)     Height 02/18/17 0859 5\' 6"  (1.676 m)     Head Circumference --      Peak Flow --      Pain Score 02/18/17 0858 0     Pain Loc --      Pain Edu? --      Excl. in [place]? --     Constitutional: Alert and oriented. Well appearingFor her age, and in no acute distress. Answers questions appropriately. Eyes: Conjunctivae are normal.  EOMI. No scleral icterus. No eye discharge. Head: Atraumatic. Nose: No congestion/rhinnorhea. Mouth/Throat: Mucous membranes are dry.  Neck: No stridor.  Supple.  No JVD. No meningismus. Cardiovascular: Normal rate, regular rhythm. No murmurs, rubs or gallops.  Respiratory: Mild tachypnea with accessory muscle use but no retractions. The patient has diffuse expiratory greater than inspiratory wheezing. She also has rales in the bases bilaterally. She is able to speak in 4-5 word sentences comfortably. Gastrointestinal: Soft, nontender and nondistended.  No guarding or rebound.  No peritoneal signs. Musculoskeletal: The left leg is well wrapped the wound care bandage to the knee. The right leg does not have any significant edema, cords or Homans sign. Neurologic:  A&Ox3.  Speech is clear.  Face and smile are symmetric.  EOMI.   Moves all extremities well. Skin:  Skin is warm. Psychiatric: Mood and affect are normal. Speech and behavior are normal.  Normal judgement.  ____________________________________________   LABS (all labs ordered are listed, but only abnormal results are displayed)  Labs Reviewed  CBC - Abnormal; Notable for the following:       Result Value   WBC 13.5 (*)    RBC 2.79 (*)    Hemoglobin 8.9 (*)    HCT 27.9 (*)    MCV 100.1 (*)    RDW 15.0 (*)    All other components within normal limits  COMPREHENSIVE METABOLIC PANEL - Abnormal; Notable for the following:    Chloride 99 (*)    BUN 62 (*)    Creatinine, Ser 2.09 (*)    Albumin 3.3 (*)    ALT 7 (*)    Alkaline Phosphatase 131 (*)    GFR calc non Af Amer 19 (*)    GFR calc Af Amer 23 (*)    All other components within normal limits  CULTURE, BLOOD (ROUTINE X 2)  CULTURE, BLOOD (ROUTINE X 2)  URINE CULTURE  TROPONIN I  BRAIN NATRIURETIC PEPTIDE  URINALYSIS, COMPLETE (UACMP) WITH MICROSCOPIC   ____________________________________________  EKG  ED ECG REPORT I, Eula Listen, the attending physician, personally viewed and interpreted this ECG.   Date: 02/18/2017  EKG Time: 904  Rate: 73  Rhythm: normal sinus rhythm  Axis: normal  Intervals:none  ST&T Change: Nonspecific T-wave inversions in V1. No STEMI  ____________________________________________  RADIOLOGY  Dg Chest 2 View  Result Date: 02/18/2017 CLINICAL DATA:  Shortness of breath. EXAM: CHEST  2 VIEW COMPARISON:  Radiographs of February 15, 2017. FINDINGS: Stable cardiomediastinal silhouette. Atherosclerosis of thoracic aorta is noted. No pneumothorax is noted. Stable mild bibasilar interstitial densities are noted which may represent scarring, but superimposed edema cannot be excluded. Minimal right pleural effusion is noted. Stable mild central pulmonary vascular congestion is noted. Bony thorax is unremarkable. IMPRESSION: Aortic atherosclerosis. Stable  mild bibasilar interstitial densities are noted which may represent scarring, but superimposed edema cannot be excluded. Minimal right pleural effusion. Stable mild central pulmonary vascular congestion. Electronically Signed   By: Marijo Conception, M.D.   On: 02/18/2017 09:40    ____________________________________________   PROCEDURES  Procedure(s) performed: None  Procedures  Critical Care performed: No ____________________________________________   INITIAL IMPRESSION / ASSESSMENT AND PLAN / ED COURSE  Pertinent labs & imaging results that were available during my care of the patient were reviewed by me and considered in my medical decision making (see chart for details).  83 y.o. female with a history of COPD and CHF presenting with cough and shortness of breath. Overall, the patient's clinical picture is mixed. She has had productive cough and on exam has wheezing with rales. This could be indicative of a pulmonary pulmonary edema although she does not have significant peripheral edema. In addition, the patient has had a cough, and today has a mildly elevated white blood cell count, but she was recently placed on a steroid course which could have him to her white blood cell count. Her chest x-ray does not show focal infiltrate but she does have bilateral interstitial opacities and pleural effusion, which could be pulmonary edema or atypical infection. This time, I'll proceed with. Antibiotic treatment, DuoNeb. For her fluid status, I will await her BNP, as infection will require fluid resuscitation, and CHF exacerbation would require diuresis. COPD flare is also on the differential. The patient will require admission for further evaluation and treatment.   ____________________________________________  FINAL CLINICAL IMPRESSION(S) / ED DIAGNOSES  Final diagnoses:  COPD exacerbation ([place])  Shortness of breath  Orthopnea  Cough with sputum         NEW MEDICATIONS STARTED DURING  THIS VISIT:  New Prescriptions   No medications on file      Eula Listen, MD 02/18/17 1010

## 2017-02-19 LAB — BASIC METABOLIC PANEL
ANION GAP: 9 (ref 5–15)
BUN: 65 mg/dL — ABNORMAL HIGH (ref 6–20)
CALCIUM: 9.4 mg/dL (ref 8.9–10.3)
CHLORIDE: 95 mmol/L — AB (ref 101–111)
CO2: 33 mmol/L — AB (ref 22–32)
Creatinine, Ser: 2.13 mg/dL — ABNORMAL HIGH (ref 0.44–1.00)
GFR calc non Af Amer: 19 mL/min — ABNORMAL LOW (ref >60)
GFR, EST AFRICAN AMERICAN: 22 mL/min — AB (ref >60)
Glucose, Bld: 115 mg/dL — ABNORMAL HIGH (ref 65–99)
POTASSIUM: 4 mmol/L (ref 3.5–5.1)
Sodium: 137 mmol/L (ref 135–145)

## 2017-02-19 LAB — CBC
HEMATOCRIT: 29.2 % — AB (ref 35.0–47.0)
HEMOGLOBIN: 9.7 g/dL — AB (ref 12.0–16.0)
MCH: 32.5 pg (ref 26.0–34.0)
MCHC: 33.2 g/dL (ref 32.0–36.0)
MCV: 97.9 fL (ref 80.0–100.0)
PLATELETS: 223 10*3/uL (ref 150–440)
RBC: 2.99 MIL/uL — AB (ref 3.80–5.20)
RDW: 14.4 % (ref 11.5–14.5)
WBC: 12.3 10*3/uL — ABNORMAL HIGH (ref 3.6–11.0)

## 2017-02-19 LAB — URINE CULTURE: Culture: NO GROWTH

## 2017-02-19 MED ORDER — FUROSEMIDE 10 MG/ML IJ SOLN
40.0000 mg | Freq: Every day | INTRAMUSCULAR | Status: DC
  Administered 2017-02-20: 40 mg via INTRAVENOUS
  Filled 2017-02-19: qty 4

## 2017-02-19 NOTE — Progress Notes (Signed)
New skin tear on right thumb.  Occurred while she was getting out of bed to get into the chair.  Mepilex applied to the site.

## 2017-02-19 NOTE — Clinical Social Work Note (Signed)
Clinical Social Work Assessment  Patient Details  Name: [name] [name] MRN: [id number] Date of Birth: October 06, 1923  Date of referral:  02/19/17               Reason for consult:  Facility Placement                Permission sought to share information with:  Facility Sport and exercise psychologist, Family Supports Permission granted to share information::  Yes, Verbal Permission Granted  Name::     Sheilah Pigeon Daughter [phone]   Agency::  [facility]  Relationship::     Contact Information:     Housing/Transportation Living arrangements for the past 2 months:  [place] of Information:  Patient Patient Interpreter Needed:  None Criminal Activity/Legal Involvement Pertinent to Current Situation/Hospitalization:  No - Comment as needed Significant Relationships:  Adult Children Lives with:  Facility Resident Do you feel safe going back to the place where you live?  Yes Need for family participation in patient care:  No (Coment)  Care giving concerns:  Patient is in agreement to returning back to [facility]    Social Worker assessment / plan:  Patient is a 83 year old female who is alert and oriented x4 and able to express herself.  Patient is from [facility] and she states that she is happy with the care being provided and she likes the other residents at the ALF.  Patient states that she was at [facility] in December, and then she did not get well enough to return home, so she agreed to move into [facility].  Patient states she enjoys where she lives and people treat her well.  Patient did not have any other questions or concerns about returning back to ALF.  Employment status:  Retired Forensic scientist:  Medicare PT Recommendations:  Home with [place] / Referral to community resources:     Patient/Family's Response to care:  Patient and family agreeable to returning back to ALF.  Patient/Family's Understanding of and Emotional  Response to Diagnosis, Current Treatment, and Prognosis:  Patient expressed that she is feeling better and would like to return back to ALF on Saturday or Sunday.  Emotional Assessment Appearance:  Appears stated age Attitude/Demeanor/Rapport:    Affect (typically observed):  Appropriate, Calm, Pleasant Orientation:  Oriented to Self, Oriented to Place, Oriented to  Time, Oriented to Situation Alcohol / Substance use:  Not Applicable Psych involvement (Current and /or in the community):  No (Comment)  Discharge Needs  Concerns to be addressed:  Care Coordination Readmission within the last 30 days:  No Current discharge risk:  None Barriers to Discharge:  Continued Medical Work up   Anell Barr 02/19/2017, 3:32 PM

## 2017-02-19 NOTE — Progress Notes (Signed)
Pharmacy Antibiotic Note  [name] [name] is a 83 y.o. female admitted on 02/18/2017 with pneumonia.  Pharmacy has been consulted for levofloxacin dosing.  Plan: Continue levofloxacin 500 mg IV q48 hours.   Height: 5\' 6"  (167.6 cm) Weight: 134 lb (60.8 kg) IBW/kg (Calculated) : 59.3  Temp (24hrs), Avg:97.9 F (36.6 C), Min:97.5 F (36.4 C), Max:98.4 F (36.9 C)   Recent Labs Lab 02/15/17 1710 02/18/17 0901 02/19/17 1204  WBC 8.7 13.5* 12.3*  CREATININE 2.04* 2.09* 2.13*    Estimated Creatinine Clearance: 15.8 mL/min (A) (by C-G formula based on SCr of 2.13 mg/dL (H)).    Allergies  Allergen Reactions  . Cholesterol Other (See Comments)    Patient states she is allergic to a cholesterol medication, which causes flu-like symptoms. Name is unknown.  . Morphine And Related     Heart problems   . Zoloft [Sertraline Hcl] Swelling    Antimicrobials this admission: Levofloxacin 5/3 >>   Microbiology results: 5/3 BCx: sent  5/3 UCx: sent  5/3 Sputum: sent    Thank you for allowing pharmacy to be a part of this patient's care.  Larene Beach, PharmD 02/19/2017 1:12 PM

## 2017-02-19 NOTE — Care Management Important Message (Signed)
Important Message  Patient Details  Name: [name] [name] MRN: [id number] Date of Birth: 1923-07-12   Medicare Important Message Given:  Yes Signed IM notice given    Katrina Stack, RN 02/19/2017, 9:08 AM

## 2017-02-19 NOTE — Progress Notes (Signed)
Refuses the low bed.  She is tall and says she will have trouble getting out of it.

## 2017-02-19 NOTE — Progress Notes (Signed)
Pt refusing labs this morning.

## 2017-02-19 NOTE — Progress Notes (Addendum)
[place] [facility] [place] NAME: [name] [name]    MR#:  [id number]  DATE OF BIRTH:  11-21-1922  SUBJECTIVE:   Patient reports shortness of breath has improved. She is coughing up yellow sputum.  REVIEW OF SYSTEMS:    Review of Systems  Constitutional: Negative for fever, chills weight loss And plan generalized weakness HENT: Negative for ear pain, nosebleeds, congestion, facial swelling, rhinorrhea, neck pain, neck stiffness and ear discharge.   Respiratory: Positive for cough Shortness of breath is improved his wheezing has also  Cardiovascular: Negative for chest pain, palpitations and leg swelling.  Gastrointestinal: Negative for heartburn, abdominal pain, vomiting, diarrhea or consitpation Genitourinary: Negative for dysuria, urgency, frequency, hematuria Musculoskeletal: Negative for back pain or joint pain Neurological: Negative for dizziness, seizures, syncope, focal weakness,  numbness and headaches.  Hematological: Does not bruise/bleed easily.  Psychiatric/Behavioral: Negative for hallucinations, confusion, dysphoric mood    Tolerating Diet: yes      DRUG ALLERGIES:   Allergies  Allergen Reactions  . Cholesterol Other (See Comments)    Patient states she is allergic to a cholesterol medication, which causes flu-like symptoms. Name is unknown.  . Morphine And Related     Heart problems   . Zoloft [Sertraline Hcl] Swelling    VITALS:  Blood pressure (!) 153/63, pulse 75, temperature 97.9 F (36.6 C), temperature source Oral, resp. rate 16, height 5\' 6"  (1.676 m), weight 60.8 kg (134 lb), SpO2 100 %.  PHYSICAL EXAMINATION:  Constitutional: Appears well-developed and well-nourished. No distress.Appears younger than stated age HENT: Normocephalic. Marland Kitchen Oropharynx is clear and moist.  Eyes: Conjunctivae and EOM are normal. PERRLA, no scleral icterus.  Neck: Normal ROM. Neck supple. No JVD. No tracheal deviation. CVS: RRR,  S1/S2 +, no murmurs, no gallops, no carotid bruit.  Pulmonary: Mild bilateral wheezing with good airflow  Abdominal: Soft. BS +,  no distension, tenderness, rebound or guarding.  Musculoskeletal: Normal range of motion. No edema and no tenderness. Left arm in splint  Neuro: Alert. CN 2-12 grossly intact. No focal deficits. Skin: Chronic leg wounds  Psychiatric: Normal mood and affect.      LABORATORY PANEL:   CBC  Recent Labs Lab 02/18/17 0901  WBC 13.5*  HGB 8.9*  HCT 27.9*  PLT 208   ------------------------------------------------------------------------------------------------------------------  Chemistries   Recent Labs Lab 02/18/17 0901  NA 137  K 4.6  CL 99*  CO2 30  GLUCOSE 92  BUN 62*  CREATININE 2.09*  CALCIUM 9.4  AST 21  ALT 7*  ALKPHOS 131*  BILITOT 0.4   ------------------------------------------------------------------------------------------------------------------  Cardiac Enzymes  Recent Labs Lab 02/15/17 1710 02/18/17 0901  TROPONINI <0.03 <0.03   ------------------------------------------------------------------------------------------------------------------  RADIOLOGY:  Dg Chest 2 View  Result Date: 02/18/2017 CLINICAL DATA:  Shortness of breath. EXAM: CHEST  2 VIEW COMPARISON:  Radiographs of February 15, 2017. FINDINGS: Stable cardiomediastinal silhouette. Atherosclerosis of thoracic aorta is noted. No pneumothorax is noted. Stable mild bibasilar interstitial densities are noted which may represent scarring, but superimposed edema cannot be excluded. Minimal right pleural effusion is noted. Stable mild central pulmonary vascular congestion is noted. Bony thorax is unremarkable. IMPRESSION: Aortic atherosclerosis. Stable mild bibasilar interstitial densities are noted which may represent scarring, but superimposed edema cannot be excluded. Minimal right pleural effusion. Stable mild central pulmonary vascular congestion. Electronically  Signed   By: Marijo Conception, M.D.   On: 02/18/2017 09:40     ASSESSMENT AND PLAN:   83 year old female  from assisted living facility with chronic respiratory failure on 2 L of oxygen who presents with shortness of breath.   1. Acute exacerbation of COPD with chronic respiratory failure on 2 L of oxygen: Patient will continue on Solu-Medrol 40 daily. Continue nebulizer treatments and Levaquin.   2. Acute on chronic diastolic heart failure with preserved ejection fraction: Continue IV Lasix. BMP pending for a.m.  3. Acute on chronic kidney disease stage IV: BMP pending for today I will follow-up with this.  4. Left humerus fracture in brace PT consult   5. Left lower leg wounds present on admission: Cleanse wounds to left lower leg and right toe with NS and pat gently dry.  Apply mupirocin cream to wound bed.  Cover with 4x4 and kerlix/tape.  Change daily.  Allevyn sacral dressing.  Change every 3 days and PRN soilage.  6. Protein calorie malnutrition: Continue the supplements  7. Hypertension: Continue Coreg  8. History of CAD: Continue Plavix and Coreg.  9. Anemia of chronic chronic disease: Hemoglobin remained baseline. CBC for a.m.  Management plans discussed with the patient and she is in agreement.  CODE STATUS: full  TOTAL TIME TAKING CARE OF THIS PATIENT: 30 minutes.    Physical therapy is recommended ALF with HHC  POSSIBLE D/C tomorrow, DEPENDING ON CLINICAL CONDITION.   Tashea Othman M.D on 02/19/2017 at 10:17 AM  Between 7am to 6pm - Pager - [phone] After 6pm go to [email or web address] - password [email or web address] [facility]  Office  [phone]  CC: Primary care physician; Bonnita Nasuti, MD  Note: This dictation was prepared with Dragon dictation along with smaller phrase technology. Any transcriptional errors that result from this process are unintentional.

## 2017-02-19 NOTE — NC FL2 (Addendum)
[place] LEVEL OF CARE SCREENING TOOL     IDENTIFICATION  Patient Name: [name] [name] Birthdate: Jan 29, 1923 Sex: female Admission Date (Current Location): 02/18/2017  [place] and [place] Number:  Engineering geologist and Address:  [facility], [address], [place], [place] [address]      Provider Number: [id number]  Attending Physician Name and Address:  Bettey Costa, MD  Relative Name and Phone Number:  Sheilah Pigeon Daughter [phone]     Current Level of Care: Hospital Recommended Level of Care: [facility]) Prior Approval Number:    Date Approved/Denied:  PASRR Number: [id number]  Discharge Plan: Other (Comment)  [facility]    Current Diagnoses: Patient Active Problem List   Diagnosis Date Noted  . COPD exacerbation ([place]) 02/18/2017  . Pressure injury of skin 02/18/2017  . Chronic diastolic heart failure ([place]) 12/15/2016  . HTN (hypertension) 12/15/2016  . Hypotension 11/16/2016  . CAP (community acquired pneumonia) 11/07/2016  . Generalized anxiety disorder 09/26/2016  . PAD (peripheral artery disease) ([place]) 10/05/2013  . Chronic venous insufficiency 10/05/2013    Orientation RESPIRATION BLADDER Height & Weight     Self, Place  O2 (2L) Incontinent Weight: 134 lb (60.8 kg) Height:  5\' 6"  (167.6 cm)  BEHAVIORAL SYMPTOMS/MOOD NEUROLOGICAL BOWEL NUTRITION STATUS      Continent Diet (Low Sodium diet.)  AMBULATORY STATUS COMMUNICATION OF NEEDS Skin   Limited Assist Verbally PU Stage and Appropriate Care    Dressing change PRN                   Personal Care Assistance Level of Assistance  Dressing, Bathing, Feeding Bathing Assistance: Limited assistance Feeding assistance: Limited assistance Dressing Assistance: Limited assistance     Functional Limitations Info  Sight, Hearing, Speech Sight Info: Adequate Hearing Info: Adequate Speech Info: Adequate    SPECIAL CARE FACTORS  FREQUENCY  PT (By licensed PT)     PT Frequency: 2x a week with home health.              Contractures Contractures Info: Not present    Additional Factors Info  Code Status, Allergies Code Status Info: DNR Allergies Info: CHOLESTEROL, MORPHINE AND RELATED, ZOLOFT SERTRALINE HCL            Current Medications (02/19/2017):  This is the current hospital active medication list Current Facility-Administered Medications  Medication Dose Route Frequency Provider Last Rate Last Dose  . acetaminophen (TYLENOL) tablet 650 mg  650 mg Oral Q6H PRN Loletha Grayer, MD       Or  . acetaminophen (TYLENOL) suppository 650 mg  650 mg Rectal Q6H PRN Loletha Grayer, MD      . acidophilus (RISAQUAD) capsule 1 capsule  1 capsule Oral Daily Loletha Grayer, MD   1 capsule at 02/19/17 0841  . ALPRAZolam Duanne Moron) tablet 0.25 mg  0.25 mg Oral BID PRN Loletha Grayer, MD   0.25 mg at 02/18/17 2016  . aspirin EC tablet 81 mg  81 mg Oral Daily Loletha Grayer, MD   81 mg at 02/19/17 0841  . budesonide (PULMICORT) nebulizer solution 0.5 mg  0.5 mg Nebulization BID Loletha Grayer, MD   0.5 mg at 02/19/17 0734  . carvedilol (COREG) tablet 12.5 mg  12.5 mg Oral BID Loletha Grayer, MD   12.5 mg at 02/19/17 0840  . clopidogrel (PLAVIX) tablet 75 mg  75 mg Oral Daily Loletha Grayer, MD   75 mg at 02/19/17 0840  .  diclofenac (FLECTOR) 1.3 % 1 patch  1 patch Transdermal Q12H Loletha Grayer, MD   1 patch at 02/19/17 [phone]  . dicyclomine (BENTYL) tablet 20 mg  20 mg Oral TID PRN Loletha Grayer, MD      . enoxaparin (LOVENOX) injection 30 mg  30 mg Subcutaneous Q24H Loletha Grayer, MD   30 mg at 02/18/17 2016  . famotidine (PEPCID) tablet 20 mg  20 mg Oral BID Loletha Grayer, MD   20 mg at 02/19/17 0841  . feeding supplement (BOOST HIGH PROTEIN) liquid 237 mL  1 Container Oral Daily Loletha Grayer, MD   237 mL at 02/18/17 1100  . fluticasone (FLONASE) 50 MCG/ACT nasal spray 1 spray  1 spray Each Nare Daily  Loletha Grayer, MD   1 spray at 02/19/17 0845  . [START ON 02/20/2017] furosemide (LASIX) injection 40 mg  40 mg Intravenous Daily Sital Mody, MD      . gabapentin (NEURONTIN) capsule 300 mg  300 mg Oral TID Loletha Grayer, MD   300 mg at 02/19/17 0840  . ipratropium-albuterol (DUONEB) 0.5-2.5 (3) MG/3ML nebulizer solution 3 mL  3 mL Nebulization Q6H Loletha Grayer, MD   3 mL at 02/19/17 1402  . [START ON 02/20/2017] levofloxacin (LEVAQUIN) IVPB 500 mg  500 mg Intravenous Q48H Loree Fee, RPH      . loratadine (CLARITIN) tablet 10 mg  10 mg Oral Daily Loletha Grayer, MD   10 mg at 02/19/17 0840  . methylPREDNISolone sodium succinate (SOLU-MEDROL) 40 mg/mL injection 40 mg  40 mg Intravenous Daily Loletha Grayer, MD   40 mg at 02/19/17 0846  . mupirocin cream (BACTROBAN) 2 %   Topical BID Loletha Grayer, MD      . nitroGLYCERIN (NITRODUR - Dosed in mg/24 hr) patch 0.2 mg  0.2 mg Transdermal Daily Loletha Grayer, MD   0.2 mg at 02/19/17 0848  . ondansetron (ZOFRAN) tablet 4 mg  4 mg Oral Q6H PRN Loletha Grayer, MD       Or  . ondansetron [facility] injection 4 mg  4 mg Intravenous Q6H PRN Loletha Grayer, MD      . polyethylene glycol (MIRALAX / GLYCOLAX) packet 17 g  17 g Oral Daily Lance Coon, MD   17 g at 02/19/17 0901  . potassium chloride (K-DUR,KLOR-CON) CR tablet 10 mEq  10 mEq Oral Daily Loletha Grayer, MD   10 mEq at 02/19/17 0841  . senna-docusate (Senokot-S) tablet 2 tablet  2 tablet Oral Daily Loletha Grayer, MD   2 tablet at 02/19/17 [phone]  . traMADol (ULTRAM) tablet 50 mg  50 mg Oral Q4H PRN Loletha Grayer, MD   50 mg at 02/19/17 0841  . TRIPLE ANTIBIOTIC [phone] OINT 1 application  1 application Topical Daily Loletha Grayer, MD   1 application at 25/36/64 [phone]  . vitamin B-12 (CYANOCOBALAMIN) tablet 1,000 mcg  1,000 mcg Oral Daily Loletha Grayer, MD   1,000 mcg at 02/19/17 7425     Discharge Medications: DISCHARGE MEDICATIONS:       Current Discharge  Medication List        START taking these medications   Details  levofloxacin (LEVAQUIN) 500 MG tablet Take 1 tablet (500 mg total) by mouth every other day. Qty: 2 tablet, Refills: 0          CONTINUE these medications which have CHANGED   Details  carvedilol (COREG) 25 MG tablet Take 1 tablet (25 mg total) by mouth 2 (two) times daily. Qty: 60 tablet,  Refills: 0    furosemide (LASIX) 20 MG tablet Take 1 tablet (20 mg total) by mouth daily. Qty: 30 tablet, Refills: 0    predniSONE (DELTASONE) 50 MG tablet Take 0.5 tablets (25 mg total) by mouth daily with breakfast. Qty: 4 tablet, Refills: 0      CONTINUE these medications which have NOT CHANGED   Details  aspirin 81 MG tablet Take 81 mg by mouth daily.    Cholecalciferol 2000 units CAPS Take 4,000 Units by mouth daily.     clopidogrel (PLAVIX) 75 MG tablet Take 1 tablet by mouth daily.    cyanocobalamin 1000 MCG tablet Take 1,000 mcg by mouth daily.     FLECTOR 1.3 % PTCH Place 1 patch onto the skin every 12 (twelve) hours.     gabapentin (NEURONTIN) 300 MG capsule Take 1 capsule by mouth 3 (three) times daily.     loratadine (CLARITIN) 10 MG tablet Take 10 mg by mouth daily.    nitroGLYCERIN (NITRODUR - DOSED IN MG/24 HR) 0.2 mg/hr patch Place 1 patch onto the skin daily.     polyethylene glycol (MIRALAX / GLYCOLAX) packet Take 17 g by mouth daily.    potassium chloride SA (K-DUR,KLOR-CON) 10 MEQ tablet Take 1 tablet (10 mEq total) by mouth daily. Qty: 30 tablet, Refills: 0    ranitidine (ZANTAC) 150 MG capsule Take 150 mg by mouth 2 (two) times daily.    sennosides-docusate sodium (SENOKOT-S) 8.6-50 MG tablet Take 2 tablets by mouth daily.    acetaminophen (TYLENOL) 325 MG tablet Take 650 mg by mouth every 6 (six) hours as needed for mild pain or moderate pain.    albuterol (PROVENTIL HFA;VENTOLIN HFA) 108 (90 Base) MCG/ACT inhaler Inhale 2 puffs into the lungs every 6 (six) hours as  needed for wheezing or shortness of breath. Qty: 1 Inhaler, Refills: 0    ALPRAZolam (XANAX) 0.25 MG tablet Take 0.25 mg by mouth 2 (two) times daily as needed for anxiety or agitation. Refills: 1    celecoxib (CELEBREX) 100 MG capsule Take 100 mg by mouth every other day as needed. Refills: 5    dicyclomine (BENTYL) 20 MG tablet Take 1 tablet (20 mg total) by mouth 3 (three) times daily as needed for spasms. Qty: 30 tablet, Refills: 0    feeding supplement (BOOST HIGH PROTEIN) LIQD Take 1 Container by mouth daily.    neomycin-bacitracin-polymyxin (NEOSPORIN) [phone] ointment Apply 1 application topically daily.    ondansetron (ZOFRAN) 4 MG tablet Take 1 tablet (4 mg total) by mouth every 8 (eight) hours as needed for nausea or vomiting. Qty: 21 tablet, Refills: 0    Probiotic Product (ALIGN) 4 MG CAPS Take 4 mg by mouth daily.    Spacer/Aero-Hold Chamber Mask MISC 1 Units by Does not apply route every 6 (six) hours as needed. Qty: 1 each, Refills: 0    traMADol (ULTRAM) 50 MG tablet Take 50 mg by mouth every 4 (four) hours as needed for moderate pain or severe pain.         STOP taking these medications     azithromycin (ZITHROMAX Z-PAK) 250 MG tablet         Relevant Imaging Results:  Relevant Lab Results:   Additional Information SSN [id number]  Ross Ludwig, [place]

## 2017-02-19 NOTE — Evaluation (Signed)
Physical Therapy Evaluation Patient Details Name: [name] [name] MRN: [id number] DOB: Feb 04, 1923 Today's Date: 02/19/2017   History of Present Illness  83 y/o female who was in ED recently with SOB, returns with COPD exacerbation and general weakness.  She suffered L proximal humerus fx with complicated recovery, now has brace/splint and can not use L arm.  Pt has been weak, minimally ambulatory and has sacral ulcer.  Clinical Impression  Pt initially indicating that she has not walked more than 5 feet in months, but was able to walk ~10 ft with HW with out LOBs or overt safety issues.  She was very fatigued with the effort and would have struggled to do much more, but ultimately she was able to do some mobility.  She is against going to rehab facility but willing to do PT at her ALF, though ultimately she feels she is ready to go to a SNF long term unless she gets back to walking much better than she has recently been able.    Follow Up Recommendations Home health PT (with return to ALF)    Equipment Recommendations       Recommendations for Other Services       Precautions / Restrictions Precautions Precautions: Fall Restrictions Weight Bearing Restrictions: Yes LUE Weight Bearing: Non weight bearing      Mobility  Bed Mobility Overal bed mobility: Needs Assistance Bed Mobility: Supine to Sit;Sit to Supine     Supine to sit: Min assist Sit to supine: Min assist   General bed mobility comments: Pt needed light assist with getting out of and into bed, but ultimately showed good effort and did not need excessive assist.  Transfers Overall transfer level: Needs assistance Equipment used: Hemi-walker Transfers: Sit to/from Stand Sit to Stand: Min assist         General transfer comment: Pt did well with effort getting to standing and though she was reliant on the hemiwalker but relatively steady and safe.  Ambulation/Gait Ambulation/Gait assistance: Min  assist Ambulation Distance (Feet): 10 Feet Assistive device: Hemi-walker       General Gait Details: Pt was relatively safe with ambulation but was anxious and quickly fatigue with the effort.  She showed good effort, but generally lacked confidence and reports she rarely, if ever, walks more than this.  Stairs            Wheelchair Mobility    Modified Rankin (Stroke Patients Only)       Balance Overall balance assessment: Needs assistance Sitting-balance support: Single extremity supported Sitting balance-Leahy Scale: Good     Standing balance support: Single extremity supported Standing balance-Leahy Scale: Fair Standing balance comment: using hemiwalker pt as some unsteadiness but was generally safe, able to remain upright                             Pertinent Vitals/Pain Pain Assessment: No/denies pain    Home Living Family/patient expects to be discharged to:: Assisted living               Home Equipment:  (has been using HW and SPC at ALF)      Prior Function Level of Independence: Needs assistance         Comments: Pt reports that she uses w/c most of the time, minimally ambulatory.     Hand Dominance        Extremity/Trunk Assessment   Upper Extremity Assessment Upper Extremity Assessment: Generalized  weakness (L UE not tested, R grossly 4-/5)    Lower Extremity Assessment Lower Extremity Assessment: Generalized weakness (grossly 3/5 b/l)       Communication   Communication: HOH;No difficulties  Cognition Arousal/Alertness: Awake/alert Behavior During Therapy: WFL for tasks assessed/performed Overall Cognitive Status: Within Functional Limits for tasks assessed                                        General Comments      Exercises     Assessment/Plan    PT Assessment Patient needs continued PT services  PT Problem List Decreased strength;Decreased range of motion;Decreased activity  tolerance;Decreased balance;Decreased mobility;Decreased knowledge of use of DME;Decreased safety awareness;Pain;Cardiopulmonary status limiting activity       PT Treatment Interventions DME instruction;Gait training;Functional mobility training;Therapeutic activities;Balance training;Therapeutic exercise;Patient/family education    PT Goals (Current goals can be found in the Care Plan section)  Acute Rehab PT Goals Patient Stated Goal: eventually go to nursing facility PT Goal Formulation: With patient Time For Goal Achievement: 03/05/17 Potential to Achieve Goals: Fair    Frequency Min 2X/week   Barriers to discharge        Co-evaluation               AM-PAC PT "6 Clicks" Daily Activity  Outcome Measure Difficulty turning over in bed (including adjusting bedclothes, sheets and blankets)?: A Lot Difficulty moving from lying on back to sitting on the side of the bed? : Total Difficulty sitting down on and standing up from a chair with arms (e.g., wheelchair, bedside commode, etc,.)?: Total Help needed moving to and from a bed to chair (including a wheelchair)?: A Little Help needed walking in hospital room?: A Little Help needed climbing 3-5 steps with a railing? : A Lot 6 Click Score: 12    End of Session Equipment Utilized During Treatment: Gait belt Activity Tolerance: Patient limited by fatigue Patient left: with bed alarm set;with call bell/phone within reach Nurse Communication: Mobility status PT Visit Diagnosis: Muscle weakness (generalized) (M62.81);Difficulty in walking, not elsewhere classified (R26.2)    Time: 5397-6734 PT Time Calculation (min) (ACUTE ONLY): 25 min   Charges:   PT Evaluation $PT Eval Low Complexity: 1 Procedure     PT G Codes:        Kreg Shropshire, DPT 02/19/2017, 1:27 PM

## 2017-02-20 ENCOUNTER — Inpatient Hospital Stay: Payer: Medicare Other

## 2017-02-20 LAB — BASIC METABOLIC PANEL
Anion gap: 9 (ref 5–15)
BUN: 71 mg/dL — ABNORMAL HIGH (ref 6–20)
CALCIUM: 9.1 mg/dL (ref 8.9–10.3)
CO2: 32 mmol/L (ref 22–32)
Chloride: 95 mmol/L — ABNORMAL LOW (ref 101–111)
Creatinine, Ser: 2.15 mg/dL — ABNORMAL HIGH (ref 0.44–1.00)
GFR calc Af Amer: 22 mL/min — ABNORMAL LOW (ref >60)
GFR, EST NON AFRICAN AMERICAN: 19 mL/min — AB (ref >60)
Glucose, Bld: 127 mg/dL — ABNORMAL HIGH (ref 65–99)
Potassium: 3.8 mmol/L (ref 3.5–5.1)
SODIUM: 136 mmol/L (ref 135–145)

## 2017-02-20 LAB — CBC
HCT: 28.8 % — ABNORMAL LOW (ref 35.0–47.0)
HEMOGLOBIN: 9.6 g/dL — AB (ref 12.0–16.0)
MCH: 32.5 pg (ref 26.0–34.0)
MCHC: 33.3 g/dL (ref 32.0–36.0)
MCV: 97.7 fL (ref 80.0–100.0)
Platelets: 221 10*3/uL (ref 150–440)
RBC: 2.95 MIL/uL — ABNORMAL LOW (ref 3.80–5.20)
RDW: 14.6 % — ABNORMAL HIGH (ref 11.5–14.5)
WBC: 12 10*3/uL — AB (ref 3.6–11.0)

## 2017-02-20 LAB — CULTURE, RESPIRATORY: SPECIAL REQUESTS: NORMAL

## 2017-02-20 LAB — CULTURE, RESPIRATORY W GRAM STAIN

## 2017-02-20 LAB — MAGNESIUM: MAGNESIUM: 1.8 mg/dL (ref 1.7–2.4)

## 2017-02-20 MED ORDER — LEVOFLOXACIN 500 MG PO TABS: 500.0000 mg | ORAL_TABLET | ORAL | 0 refills | Status: DC

## 2017-02-20 MED ORDER — CARVEDILOL 25 MG PO TABS
25.0000 mg | ORAL_TABLET | Freq: Two times a day (BID) | ORAL | Status: DC
  Administered 2017-02-20 – 2017-02-21 (×3): 25 mg via ORAL
  Filled 2017-02-20 (×3): qty 1

## 2017-02-20 MED ORDER — FLEET ENEMA 7-19 GM/118ML RE ENEM
1.0000 | ENEMA | Freq: Once | RECTAL | Status: AC
  Administered 2017-02-20: 1 via RECTAL

## 2017-02-20 MED ORDER — DOCUSATE SODIUM 100 MG PO CAPS
200.0000 mg | ORAL_CAPSULE | Freq: Two times a day (BID) | ORAL | Status: DC
Start: 2017-02-20 — End: 2017-02-21
  Administered 2017-02-20 – 2017-02-21 (×3): 200 mg via ORAL
  Filled 2017-02-20 (×3): qty 2

## 2017-02-20 MED ORDER — FUROSEMIDE 20 MG PO TABS: 20.0000 mg | ORAL_TABLET | Freq: Every day | ORAL | 0 refills | Status: DC

## 2017-02-20 MED ORDER — DOXYCYCLINE HYCLATE 100 MG PO TABS
100.0000 mg | ORAL_TABLET | Freq: Two times a day (BID) | ORAL | Status: DC
  Administered 2017-02-20 – 2017-02-21 (×3): 100 mg via ORAL
  Filled 2017-02-20 (×5): qty 1

## 2017-02-20 MED ORDER — PREDNISONE 50 MG PO TABS: 10.0000 mg | ORAL_TABLET | Freq: Every day | ORAL | 0 refills | Status: AC

## 2017-02-20 MED ORDER — SENNA 8.6 MG PO TABS
1.0000 | ORAL_TABLET | Freq: Every day | ORAL | Status: DC
  Administered 2017-02-20: 8.6 mg via ORAL

## 2017-02-20 MED ORDER — CARVEDILOL 25 MG PO TABS: 25.0000 mg | ORAL_TABLET | Freq: Two times a day (BID) | ORAL | 0 refills | Status: AC

## 2017-02-20 NOTE — Progress Notes (Signed)
Continuous cardiac monitoring discontinued per order by Dr. Benjie Karvonen

## 2017-02-20 NOTE — Discharge Summary (Addendum)
[place] [facility] [place] NAME: [name] [name]    MR#:  [id number]  DATE OF BIRTH:  [birth year]  DATE OF ADMISSION:  02/18/2017 ADMITTING PHYSICIAN: Loletha Grayer, MD  DATE OF DISCHARGE: 02/21/2017  PRIMARY CARE PHYSICIAN: Raelyn Number, MD    ADMISSION DIAGNOSIS:  Orthopnea [R06.01] Shortness of breath [R06.02] COPD exacerbation (HCC) [J44.1] Cough with sputum [R05]  DISCHARGE DIAGNOSIS:  Active Problems:   COPD exacerbation ([place])   Pressure injury of skin  chf exacerbation SECONDARY DIAGNOSIS:   Past Medical History:  Diagnosis Date  . Anemia   . CHF (congestive heart failure) ([place])   . Coronary artery disease   . Emphysema lung ([place])   . Emphysema of lung ([place])   . GERD (gastroesophageal reflux disease)   . Hyperlipidemia   . Hypertension   . Neuropathy   . Osteoporosis   . Peripheral vascular disease ([place])   . Renal disorder   . Stroke [facility])     HOSPITAL COURSE:   83 year old female from assisted living facility with chronic respiratory failure on 2 L of oxygen who presents with shortness of breath.   1. Acute exacerbation of COPD with chronic respiratory failure on 2 L of oxygen/acute bronchitis:  She has improved wheezing. She will be discharged with prednisone for 4 more days. She will continue Doxycycline as her sputum cx came back for MRSA. Marland Kitchen Repeat Chest x-ray did not show pneumonia.  2. Acute on chronic diastolic heart failure with preserved ejection fraction:  Patient was diuresed with IV Lasix. She will resume outpatient Lasix once creatinine has improved.  She was referred to CHF clinic.   3. Chronic kidney disease stage III: Patient's creatinine is slightly elevated. She will need to have follow up labs drawn by [facility] and sent to PCP tomorrow. She should avoid NSAIDS and nephrotxic agents. She will need to have follow-up with her primary care physician  4. Left humerus fracture in brace PT consult has  recommended home health   5. Left lower leg wounds present on admission: Cleanse wounds to left lower leg and right toe with NS and pat gently dry. Apply mupirocin cream to wound bed. Cover with 4x4 and kerlix/tape. Change daily.  Allevyn sacral dressing. Change every 3 days and PRN soilage.  6. Protein calorie malnutrition: Continue nutritional supplements  7. Hypertension: Due to elevated blood pressure and heart records was increased 8. History of CAD: Continue Plavix and Coreg.  9. Anemia of chronic chronic disease: Hemoglobin is at baseline   DISCHARGE CONDITIONS AND DIET:   Stable for discharge on heart healthy diet  CONSULTS OBTAINED:    DRUG ALLERGIES:   Allergies  Allergen Reactions  . Cholesterol Other (See Comments)    Patient states she is allergic to a cholesterol medication, which causes flu-like symptoms. Name is unknown.  . Morphine And Related     Heart problems   . Zoloft [Sertraline Hcl] Swelling    DISCHARGE MEDICATIONS:   Current Discharge Medication List    START taking these medications   Details  doxycycline (VIBRA-TABS) 100 MG tablet Take 1 tablet (100 mg total) by mouth every 12 (twelve) hours. Qty: 18 tablet, Refills: 0      CONTINUE these medications which have CHANGED   Details  carvedilol (COREG) 25 MG tablet Take 1 tablet (25 mg total) by mouth 2 (two) times daily. Qty: 60 tablet, Refills: 0    predniSONE (DELTASONE) 50 MG tablet Take 0.5 tablets (  25 mg total) by mouth daily with breakfast. Qty: 4 tablet, Refills: 0      CONTINUE these medications which have NOT CHANGED   Details  aspirin 81 MG tablet Take 81 mg by mouth daily.    Cholecalciferol 2000 units CAPS Take 4,000 Units by mouth daily.     clopidogrel (PLAVIX) 75 MG tablet Take 1 tablet by mouth daily.    cyanocobalamin 1000 MCG tablet Take 1,000 mcg by mouth daily.     FLECTOR 1.3 % PTCH Place 1 patch onto the skin every 12 (twelve) hours.      gabapentin (NEURONTIN) 300 MG capsule Take 1 capsule by mouth 3 (three) times daily.     loratadine (CLARITIN) 10 MG tablet Take 10 mg by mouth daily.    nitroGLYCERIN (NITRODUR - DOSED IN MG/24 HR) 0.2 mg/hr patch Place 1 patch onto the skin daily.     polyethylene glycol (MIRALAX / GLYCOLAX) packet Take 17 g by mouth daily.    potassium chloride SA (K-DUR,KLOR-CON) 10 MEQ tablet Take 1 tablet (10 mEq total) by mouth daily. Qty: 30 tablet, Refills: 0    ranitidine (ZANTAC) 150 MG capsule Take 150 mg by mouth 2 (two) times daily.    sennosides-docusate sodium (SENOKOT-S) 8.6-50 MG tablet Take 2 tablets by mouth daily.    acetaminophen (TYLENOL) 325 MG tablet Take 650 mg by mouth every 6 (six) hours as needed for mild pain or moderate pain.    albuterol (PROVENTIL HFA;VENTOLIN HFA) 108 (90 Base) MCG/ACT inhaler Inhale 2 puffs into the lungs every 6 (six) hours as needed for wheezing or shortness of breath. Qty: 1 Inhaler, Refills: 0    ALPRAZolam (XANAX) 0.25 MG tablet Take 0.25 mg by mouth 2 (two) times daily as needed for anxiety or agitation. Refills: 1    celecoxib (CELEBREX) 100 MG capsule Take 100 mg by mouth every other day as needed. Refills: 5    dicyclomine (BENTYL) 20 MG tablet Take 1 tablet (20 mg total) by mouth 3 (three) times daily as needed for spasms. Qty: 30 tablet, Refills: 0    feeding supplement (BOOST HIGH PROTEIN) LIQD Take 1 Container by mouth daily.    neomycin-bacitracin-polymyxin (NEOSPORIN) [phone] ointment Apply 1 application topically daily.    ondansetron (ZOFRAN) 4 MG tablet Take 1 tablet (4 mg total) by mouth every 8 (eight) hours as needed for nausea or vomiting. Qty: 21 tablet, Refills: 0    Probiotic Product (ALIGN) 4 MG CAPS Take 4 mg by mouth daily.    Spacer/Aero-Hold Chamber Mask MISC 1 Units by Does not apply route every 6 (six) hours as needed. Qty: 1 each, Refills: 0    traMADol (ULTRAM) 50 MG tablet Take 50 mg by mouth every 4  (four) hours as needed for moderate pain or severe pain.      STOP taking these medications     azithromycin (ZITHROMAX Z-PAK) 250 MG tablet      furosemide (LASIX) 20 MG tablet           Today   CHIEF COMPLAINT:   Patient reports shortness of breath has improved.   VITAL SIGNS:  Blood pressure (!) 135/51, pulse 78, temperature 97.8 F (36.6 C), temperature source Oral, resp. rate 16, height 5\' 6"  (1.676 m), weight 60.8 kg (134 lb), SpO2 99 %.   REVIEW OF SYSTEMS:  Review of Systems  Constitutional: Negative.  Negative for chills, fever and malaise/fatigue.  HENT: Negative.  Negative for ear discharge, ear pain, hearing  loss, nosebleeds and sore throat.   Eyes: Negative.  Negative for blurred vision and pain.  Respiratory: Positive for cough, sputum production and shortness of breath (Improved). Negative for hemoptysis and wheezing.   Cardiovascular: Negative.  Negative for chest pain, palpitations and leg swelling.  Gastrointestinal: Negative.  Negative for abdominal pain, blood in stool, diarrhea, nausea and vomiting.  Genitourinary: Negative.  Negative for dysuria.  Musculoskeletal: Negative.  Negative for back pain.  Skin: Negative.   Neurological: Negative for dizziness, tremors, speech change, focal weakness, seizures and headaches.  Endo/Heme/Allergies: Negative.  Does not bruise/bleed easily.  Psychiatric/Behavioral: Negative.  Negative for depression, hallucinations and suicidal ideas.     PHYSICAL EXAMINATION:  GENERAL:  83 y.o.-year-old patient lying in the bed with no acute distress.  NECK:  Supple, no jugular venous distention. No thyroid enlargement, no tenderness.  LUNGS: Good airflow Bilateral minimal wheezing, no rales,rhonchi  No use of accessory muscles of respiration.  CARDIOVASCULAR: S1, S2 normal. No murmurs, rubs, or gallops.  ABDOMEN: Soft, non-tender, non-distended. Bowel sounds present. No organomegaly or mass.  EXTREMITIES: No pedal edema,  cyanosis, or clubbing.  PSYCHIATRIC: The patient is alert and oriented x 3.  SKIN: No obvious rash, lesion, or ulcer.  Bruising on arms  DATA REVIEW:   CBC  Recent Labs Lab 02/20/17 0440  WBC 12.0*  HGB 9.6*  HCT 28.8*  PLT 221    Chemistries   Recent Labs Lab 02/18/17 0901  02/20/17 0440 02/21/17 0516  NA 137  < > 136 134*  K 4.6  < > 3.8 3.9  CL 99*  < > 95* 93*  CO2 30  < > 32 30  GLUCOSE 92  < > 127* 122*  BUN 62*  < > 71* 77*  CREATININE 2.09*  < > 2.15* 2.38*  CALCIUM 9.4  < > 9.1 9.0  MG  --   --  1.8  --   AST 21  --   --   --   ALT 7*  --   --   --   ALKPHOS 131*  --   --   --   BILITOT 0.4  --   --   --   < > = values in this interval not displayed.  Cardiac Enzymes  Recent Labs Lab 02/15/17 1710 02/18/17 0901  TROPONINI <0.03 <0.03    Microbiology Results  @MICRORSLT48 @  RADIOLOGY:  Dg Chest 1 View  Result Date: 02/20/2017 CLINICAL DATA:  Pneumonia. EXAM: CHEST 1 VIEW COMPARISON:  02/18/2017 FINDINGS: Aortic atherosclerosis. Normal heart size. Coarsened interstitial markings of COPD/ emphysema noted. Lungs appear hyperinflated. No airspace opacities. No pleural effusion or edema. The visualized skeletal structures are unremarkable. IMPRESSION: 1. No active disease 2. Aortic Atherosclerosis (ICD10-I70.0) and Emphysema (ICD10-J43.9). Electronically Signed   By: Kerby Moors M.D.   On: 02/20/2017 17:58      Current Discharge Medication List    START taking these medications   Details  doxycycline (VIBRA-TABS) 100 MG tablet Take 1 tablet (100 mg total) by mouth every 12 (twelve) hours. Qty: 18 tablet, Refills: 0      CONTINUE these medications which have CHANGED   Details  carvedilol (COREG) 25 MG tablet Take 1 tablet (25 mg total) by mouth 2 (two) times daily. Qty: 60 tablet, Refills: 0    predniSONE (DELTASONE) 50 MG tablet Take 0.5 tablets (25 mg total) by mouth daily with breakfast. Qty: 4 tablet, Refills: 0      CONTINUE these  medications which have NOT CHANGED   Details  aspirin 81 MG tablet Take 81 mg by mouth daily.    Cholecalciferol 2000 units CAPS Take 4,000 Units by mouth daily.     clopidogrel (PLAVIX) 75 MG tablet Take 1 tablet by mouth daily.    cyanocobalamin 1000 MCG tablet Take 1,000 mcg by mouth daily.     FLECTOR 1.3 % PTCH Place 1 patch onto the skin every 12 (twelve) hours.     gabapentin (NEURONTIN) 300 MG capsule Take 1 capsule by mouth 3 (three) times daily.     loratadine (CLARITIN) 10 MG tablet Take 10 mg by mouth daily.    nitroGLYCERIN (NITRODUR - DOSED IN MG/24 HR) 0.2 mg/hr patch Place 1 patch onto the skin daily.     polyethylene glycol (MIRALAX / GLYCOLAX) packet Take 17 g by mouth daily.    potassium chloride SA (K-DUR,KLOR-CON) 10 MEQ tablet Take 1 tablet (10 mEq total) by mouth daily. Qty: 30 tablet, Refills: 0    ranitidine (ZANTAC) 150 MG capsule Take 150 mg by mouth 2 (two) times daily.    sennosides-docusate sodium (SENOKOT-S) 8.6-50 MG tablet Take 2 tablets by mouth daily.    acetaminophen (TYLENOL) 325 MG tablet Take 650 mg by mouth every 6 (six) hours as needed for mild pain or moderate pain.    albuterol (PROVENTIL HFA;VENTOLIN HFA) 108 (90 Base) MCG/ACT inhaler Inhale 2 puffs into the lungs every 6 (six) hours as needed for wheezing or shortness of breath. Qty: 1 Inhaler, Refills: 0    ALPRAZolam (XANAX) 0.25 MG tablet Take 0.25 mg by mouth 2 (two) times daily as needed for anxiety or agitation. Refills: 1    celecoxib (CELEBREX) 100 MG capsule Take 100 mg by mouth every other day as needed. Refills: 5    dicyclomine (BENTYL) 20 MG tablet Take 1 tablet (20 mg total) by mouth 3 (three) times daily as needed for spasms. Qty: 30 tablet, Refills: 0    feeding supplement (BOOST HIGH PROTEIN) LIQD Take 1 Container by mouth daily.    neomycin-bacitracin-polymyxin (NEOSPORIN) [phone] ointment Apply 1 application topically daily.    ondansetron (ZOFRAN) 4 MG  tablet Take 1 tablet (4 mg total) by mouth every 8 (eight) hours as needed for nausea or vomiting. Qty: 21 tablet, Refills: 0    Probiotic Product (ALIGN) 4 MG CAPS Take 4 mg by mouth daily.    Spacer/Aero-Hold Chamber Mask MISC 1 Units by Does not apply route every 6 (six) hours as needed. Qty: 1 each, Refills: 0    traMADol (ULTRAM) 50 MG tablet Take 50 mg by mouth every 4 (four) hours as needed for moderate pain or severe pain.      STOP taking these medications     azithromycin (ZITHROMAX Z-PAK) 250 MG tablet      furosemide (LASIX) 20 MG tablet             Management plans discussed with the patient and she is in agreement. Stable for discharge AL with [place]  Patient should follow up with pcp  CODE STATUS:     Code Status Orders        Start     Ordered   02/18/17 1048  Do not attempt resuscitation (DNR)  Continuous    Question Answer Comment  In the event of cardiac or respiratory ARREST Do not call a "code blue"   In the event of cardiac or respiratory ARREST Do not perform Intubation, CPR, defibrillation or ACLS  In the event of cardiac or respiratory ARREST Use medication by any route, position, wound care, and other measures to relive pain and suffering. May use oxygen, suction and manual treatment of airway obstruction as needed for comfort.   Comments nurse may pronounce      02/18/17 1048    Code Status History    Date Active Date Inactive Code Status Order ID Comments User Context   11/16/2016  6:31 PM 11/18/2016  7:41 PM DNR [id number]  Awilda Bill, NP ED   11/07/2016  2:54 PM 11/09/2016  9:28 PM DNR [id number]  Idelle Crouch, MD Inpatient   11/07/2016  2:13 PM 11/07/2016  2:54 PM Full Code [id number]  Idelle Crouch, MD Inpatient    Advance Directive Documentation     Most Recent Value  Type of Advance Directive  Healthcare Power of Attorney  Pre-existing out of facility DNR order (yellow form or pink MOST form)  -  "MOST" Form in Place?  -       TOTAL TIME TAKING CARE OF THIS PATIENT: 38 minutes.    Note: This dictation was prepared with Dragon dictation along with smaller phrase technology. Any transcriptional errors that result from this process are unintentional.  Cross Jorge M.D on 02/21/2017 at 6:55 AM  Between 7am to 6pm - Pager - [phone] After 6pm go to [email or web address] - password [email or web address] [facility]  Office  [phone]  CC: Primary care physician; Raelyn Number, MD

## 2017-02-20 NOTE — Progress Notes (Signed)
[place] at [place] NAME: [name] [name]    MR#:  [id number]  DATE OF BIRTH:  04/17/1923  SUBJECTIVE:   Patient says SOB has much improved but does not want to leave today  REVIEW OF SYSTEMS:    Review of Systems  Constitutional: Negative for fever, chills weight loss And plan generalized weakness HENT: Negative for ear pain, nosebleeds, congestion, facial swelling, rhinorrhea, neck pain, neck stiffness and ear discharge.   Respiratory: Positive for cough Shortness of breath is improved his wheezing has also  Cardiovascular: Negative for chest pain, palpitations and leg swelling.  Gastrointestinal: Negative for heartburn, abdominal pain, vomiting, diarrhea or consitpation Genitourinary: Negative for dysuria, urgency, frequency, hematuria Musculoskeletal: Negative for back pain or joint pain Neurological: Negative for dizziness, seizures, syncope, focal weakness,  numbness and headaches.  Hematological: Does not bruise/bleed easily.  Psychiatric/Behavioral: Negative for hallucinations, confusion, dysphoric mood    Tolerating Diet: yes      DRUG ALLERGIES:   Allergies  Allergen Reactions  . Cholesterol Other (See Comments)    Patient states she is allergic to a cholesterol medication, which causes flu-like symptoms. Name is unknown.  . Morphine And Related     Heart problems   . Zoloft [Sertraline Hcl] Swelling    VITALS:  Blood pressure (!) 172/99, pulse 77, temperature 97.8 F (36.6 C), temperature source Oral, resp. rate 14, height 5\' 6"  (1.676 m), weight 60.8 kg (134 lb), SpO2 99 %.  PHYSICAL EXAMINATION:  Constitutional: Appears well-developed and well-nourished. No distress.Appears younger than stated age HENT: Normocephalic. Marland Kitchen Oropharynx is clear and moist.  Eyes: Conjunctivae and EOM are normal. PERRLA, no scleral icterus.  Neck: Normal ROM. Neck supple. No JVD. No tracheal deviation. CVS: RRR, S1/S2 +, no murmurs,  no gallops, no carotid bruit.  Pulmonary: Mild bilateral wheezing with good airflow  Abdominal: Soft. BS +,  no distension, tenderness, rebound or guarding.  Musculoskeletal: Normal range of motion. No edema and no tenderness. Left arm in splint  Neuro: Alert. CN 2-12 grossly intact. No focal deficits. Skin: Chronic leg wounds  Psychiatric: Normal mood and affect.      LABORATORY PANEL:   CBC  Recent Labs Lab 02/20/17 0440  WBC 12.0*  HGB 9.6*  HCT 28.8*  PLT 221   ------------------------------------------------------------------------------------------------------------------  Chemistries   Recent Labs Lab 02/18/17 0901  02/20/17 0440  NA 137  < > 136  K 4.6  < > 3.8  CL 99*  < > 95*  CO2 30  < > 32  GLUCOSE 92  < > 127*  BUN 62*  < > 71*  CREATININE 2.09*  < > 2.15*  CALCIUM 9.4  < > 9.1  MG  --   --  1.8  AST 21  --   --   ALT 7*  --   --   ALKPHOS 131*  --   --   BILITOT 0.4  --   --   < > = values in this interval not displayed. ------------------------------------------------------------------------------------------------------------------  Cardiac Enzymes  Recent Labs Lab 02/15/17 1710 02/18/17 0901  TROPONINI <0.03 <0.03   ------------------------------------------------------------------------------------------------------------------  RADIOLOGY:  No results found.   ASSESSMENT AND PLAN:   83 year old female from assisted living facility with chronic respiratory failure on 2 L of oxygen who presents with shortness of breath.   1. Acute exacerbation of COPD with chronic respiratory failure on 2 L of oxygen and MRSA in sputum with bronchitis: Patient will continue  on Solu-Medrol 40 daily. Continue nebulizer treatments and change to DOX as per sensitivites CXR in am to eval for PNA  2. Acute on chronic diastolic heart failure with preserved ejection fraction: Continue IV Lasix for today with plan to PO in am.  3. Acute on chronic kidney  disease stage IV: near baseline  4. Left humerus fracture in brace PT consult   5. Left lower leg wounds present on admission: Cleanse wounds to left lower leg and right toe with NS and pat gently dry.  Apply mupirocin cream to wound bed.  Cover with 4x4 and kerlix/tape.  Change daily.  Allevyn sacral dressing.  Change every 3 days and PRN soilage.  6. Protein calorie malnutrition: Continue nutritional supplements  7. Hypertension: Continue Coreg  8. History of CAD: Continue Plavix and Coreg.  9. Anemia of chronic chronic disease: Hemoglobin remained baseline.   Management plans discussed with the patient and she is in agreement.  CODE STATUS: full  TOTAL TIME TAKING CARE OF THIS PATIENT: 27 minutes.    Physical therapy is recommended ALF with [facility]  POSSIBLE D/C tomorrow, DEPENDING ON CLINICAL CONDITION.   Yoland Scherr M.D on 02/20/2017 at 5:07 PM  Between 7am to 6pm - Pager - [phone] After 6pm go to [email or web address] - password [email or web address] [facility]  Office  [phone]  CC: Primary care physician; Raelyn Number, MD  Note: This dictation was prepared with Dragon dictation along with smaller phrase technology. Any transcriptional errors that result from this process are unintentional.

## 2017-02-20 NOTE — Progress Notes (Signed)
Discharge order has been discontinued.  Patient and daughter feel strongly that  She is not ready for discharge.

## 2017-02-20 NOTE — Care Management Note (Addendum)
Case Management Note  Patient Details  Name: [name] [name] MRN: [id number] Date of Birth: May 30, 1923  Subjective/Objective:     [name] [name] is already open to [facility] and a referral to resume home health PT, RN, Aide, SW was called to Harlene Ramus at [facility] pending possible discharge tomorrow.. [name] [name] already has chronic 02 at [facility].   Action/Plan:   Expected Discharge Date:  02/20/17               Expected Discharge Plan:     In-House Referral:     Discharge planning Services     Post Acute Care Choice:    Choice offered to:     DME Arranged:    DME Agency:     HH Arranged:  RN, PT [place] Agency:  [place] (REsume services- [facility])  Status of Service:  Completed, signed off  If discussed at [place] of Stay Meetings, dates discussed:    Additional Comments:  Sumie Remsen A, RN 02/20/2017, 10:35 AM

## 2017-02-20 NOTE — Progress Notes (Signed)
Patient had a 16 beat run of SVT, rate in 160's. Now in irregular SR. No magnesium level available.  Dr. Benjie Karvonen notified.  Patient having a neb tx at the time.  Is asymptomatic.

## 2017-02-20 NOTE — Clinical Social Work Note (Addendum)
CSW contacted the patient's daughter to discuss discharge plan for today. Jana Half reported that she wanted to be sure that her mother is comfortable with discharging today, and she indicated that she would call CSW when she has spoken to the patient. CSW will continue to follow.   CSW received call from the patient's daughter indicating that they plan to appeal the discharge. CSW updated RNCM. CSW will continue to follow.  According to attending MD, the discharge is being held. CSW will continue to follow for care coordination at time of discharge.  Santiago Bumpers, MSW, Latanya Presser  [phone]

## 2017-02-21 LAB — BASIC METABOLIC PANEL
Anion gap: 11 (ref 5–15)
BUN: 77 mg/dL — ABNORMAL HIGH (ref 6–20)
CALCIUM: 9 mg/dL (ref 8.9–10.3)
CO2: 30 mmol/L (ref 22–32)
CREATININE: 2.38 mg/dL — AB (ref 0.44–1.00)
Chloride: 93 mmol/L — ABNORMAL LOW (ref 101–111)
GFR, EST AFRICAN AMERICAN: 19 mL/min — AB (ref >60)
GFR, EST NON AFRICAN AMERICAN: 17 mL/min — AB (ref >60)
Glucose, Bld: 122 mg/dL — ABNORMAL HIGH (ref 65–99)
Potassium: 3.9 mmol/L (ref 3.5–5.1)
SODIUM: 134 mmol/L — AB (ref 135–145)

## 2017-02-21 MED ORDER — ONDANSETRON HCL 4 MG/2ML IJ SOLN: 4.0000 mg | Freq: Four times a day (QID) | INTRAMUSCULAR | Status: DC | PRN

## 2017-02-21 MED ORDER — ONDANSETRON HCL 4 MG PO TABS: 4.0000 mg | ORAL_TABLET | Freq: Four times a day (QID) | ORAL | Status: DC | PRN

## 2017-02-21 MED ORDER — DOXYCYCLINE HYCLATE 100 MG PO TABS: 100.0000 mg | ORAL_TABLET | Freq: Two times a day (BID) | ORAL | 0 refills | Status: AC

## 2017-02-21 MED ORDER — FAMOTIDINE 20 MG PO TABS: 20.0000 mg | ORAL_TABLET | Freq: Every day | ORAL | Status: DC

## 2017-02-21 MED ORDER — FLEET ENEMA 7-19 GM/118ML RE ENEM
1.0000 | ENEMA | Freq: Once | RECTAL | Status: AC
  Administered 2017-02-21: 1 via RECTAL

## 2017-02-21 NOTE — Progress Notes (Signed)
Ems on floor to transport patient. Packet given to ems. Patient to be transferred on o2 at 2L. No distress noted.

## 2017-02-21 NOTE — Progress Notes (Signed)
Renal dose adjustment:  Changed order for famotidine 20 mg PO BID to once daily for renal function per protocol.  Lenis Noon, PharmD 02/21/17 1:40 PM

## 2017-02-21 NOTE — Clinical Social Work Note (Signed)
CSW spoke with patient who is now in agreement with discharge plan for today. The patient's daughter is aware and also in agreement. The patient will discharge to [facility] via non-emergent EMS. The patient's belongings will need to travel with her, especially a bone stimulator that is in the cabinet. The CSW also gave information about future planning for the possibility of LTC at a SNF including how to apply for special needs Medicaid through DSS. The patient's daughter thanked the CSW for assistance. CSW will continue to follow pending any additional discharge needs.  Santiago Bumpers, MSW, Latanya Presser [phone]

## 2017-02-21 NOTE — Progress Notes (Signed)
Patient to be discharged to [facility]. Ems called for transport. Patient to transport on 02 at 2l. No report needing to be called per karen. Packet will be given to ems upon discharge, DNR form in packet. Will continue to monitor

## 2017-02-22 ENCOUNTER — Telehealth: Payer: Self-pay

## 2017-02-22 NOTE — Telephone Encounter (Signed)
-----   Message from Alisa Graff, [place] sent at 02/22/2017 10:50 AM EDT ----- Regarding: Call to schedule appt Hospitalized over the weekend. Dr Benjie Karvonen sent referral

## 2017-02-23 DIAGNOSIS — I87333 Chronic venous hypertension (idiopathic) with ulcer and inflammation of bilateral lower extremity: Secondary | ICD-10-CM | POA: Diagnosis not present

## 2017-02-23 DIAGNOSIS — I130 Hypertensive heart and chronic kidney disease with heart failure and stage 1 through stage 4 chronic kidney disease, or unspecified chronic kidney disease: Secondary | ICD-10-CM | POA: Diagnosis not present

## 2017-02-23 DIAGNOSIS — L97812 Non-pressure chronic ulcer of other part of right lower leg with fat layer exposed: Secondary | ICD-10-CM | POA: Diagnosis not present

## 2017-02-23 DIAGNOSIS — I739 Peripheral vascular disease, unspecified: Secondary | ICD-10-CM | POA: Diagnosis not present

## 2017-02-23 DIAGNOSIS — M6281 Muscle weakness (generalized): Secondary | ICD-10-CM | POA: Diagnosis not present

## 2017-02-23 DIAGNOSIS — L97822 Non-pressure chronic ulcer of other part of left lower leg with fat layer exposed: Secondary | ICD-10-CM | POA: Diagnosis not present

## 2017-02-23 LAB — CULTURE, BLOOD (ROUTINE X 2)
CULTURE: NO GROWTH
Culture: NO GROWTH
Special Requests: ADEQUATE

## 2017-02-24 DIAGNOSIS — J441 Chronic obstructive pulmonary disease with (acute) exacerbation: Secondary | ICD-10-CM | POA: Diagnosis not present

## 2017-02-24 DIAGNOSIS — N189 Chronic kidney disease, unspecified: Secondary | ICD-10-CM | POA: Diagnosis not present

## 2017-02-24 DIAGNOSIS — L89152 Pressure ulcer of sacral region, stage 2: Secondary | ICD-10-CM | POA: Diagnosis not present

## 2017-02-24 DIAGNOSIS — I5033 Acute on chronic diastolic (congestive) heart failure: Secondary | ICD-10-CM | POA: Diagnosis not present

## 2017-02-24 DIAGNOSIS — E1122 Type 2 diabetes mellitus with diabetic chronic kidney disease: Secondary | ICD-10-CM | POA: Diagnosis not present

## 2017-02-24 DIAGNOSIS — I130 Hypertensive heart and chronic kidney disease with heart failure and stage 1 through stage 4 chronic kidney disease, or unspecified chronic kidney disease: Secondary | ICD-10-CM | POA: Diagnosis not present

## 2017-02-25 DIAGNOSIS — L89152 Pressure ulcer of sacral region, stage 2: Secondary | ICD-10-CM | POA: Diagnosis not present

## 2017-02-25 DIAGNOSIS — R05 Cough: Secondary | ICD-10-CM | POA: Diagnosis not present

## 2017-02-25 DIAGNOSIS — N189 Chronic kidney disease, unspecified: Secondary | ICD-10-CM | POA: Diagnosis not present

## 2017-02-25 DIAGNOSIS — I130 Hypertensive heart and chronic kidney disease with heart failure and stage 1 through stage 4 chronic kidney disease, or unspecified chronic kidney disease: Secondary | ICD-10-CM | POA: Diagnosis not present

## 2017-02-25 DIAGNOSIS — J449 Chronic obstructive pulmonary disease, unspecified: Secondary | ICD-10-CM | POA: Diagnosis not present

## 2017-02-25 DIAGNOSIS — I5033 Acute on chronic diastolic (congestive) heart failure: Secondary | ICD-10-CM | POA: Diagnosis not present

## 2017-02-25 DIAGNOSIS — E1122 Type 2 diabetes mellitus with diabetic chronic kidney disease: Secondary | ICD-10-CM | POA: Diagnosis not present

## 2017-02-25 DIAGNOSIS — E785 Hyperlipidemia, unspecified: Secondary | ICD-10-CM | POA: Diagnosis not present

## 2017-02-25 DIAGNOSIS — J441 Chronic obstructive pulmonary disease with (acute) exacerbation: Secondary | ICD-10-CM | POA: Diagnosis not present

## 2017-03-01 ENCOUNTER — Encounter: Payer: Medicare Other | Attending: Surgery | Admitting: Surgery

## 2017-03-01 DIAGNOSIS — L97222 Non-pressure chronic ulcer of left calf with fat layer exposed: Secondary | ICD-10-CM | POA: Diagnosis not present

## 2017-03-01 DIAGNOSIS — I890 Lymphedema, not elsewhere classified: Secondary | ICD-10-CM | POA: Insufficient documentation

## 2017-03-01 DIAGNOSIS — L97522 Non-pressure chronic ulcer of other part of left foot with fat layer exposed: Secondary | ICD-10-CM | POA: Insufficient documentation

## 2017-03-01 DIAGNOSIS — G629 Polyneuropathy, unspecified: Secondary | ICD-10-CM | POA: Insufficient documentation

## 2017-03-01 DIAGNOSIS — D649 Anemia, unspecified: Secondary | ICD-10-CM | POA: Diagnosis not present

## 2017-03-01 DIAGNOSIS — I110 Hypertensive heart disease with heart failure: Secondary | ICD-10-CM | POA: Insufficient documentation

## 2017-03-01 DIAGNOSIS — L89329 Pressure ulcer of left buttock, unspecified stage: Secondary | ICD-10-CM | POA: Diagnosis not present

## 2017-03-01 DIAGNOSIS — Z885 Allergy status to narcotic agent status: Secondary | ICD-10-CM | POA: Diagnosis not present

## 2017-03-01 DIAGNOSIS — I509 Heart failure, unspecified: Secondary | ICD-10-CM | POA: Insufficient documentation

## 2017-03-01 DIAGNOSIS — Z8673 Personal history of transient ischemic attack (TIA), and cerebral infarction without residual deficits: Secondary | ICD-10-CM | POA: Insufficient documentation

## 2017-03-01 DIAGNOSIS — L97229 Non-pressure chronic ulcer of left calf with unspecified severity: Secondary | ICD-10-CM | POA: Diagnosis not present

## 2017-03-01 DIAGNOSIS — S91302A Unspecified open wound, left foot, initial encounter: Secondary | ICD-10-CM | POA: Diagnosis not present

## 2017-03-01 DIAGNOSIS — K219 Gastro-esophageal reflux disease without esophagitis: Secondary | ICD-10-CM | POA: Insufficient documentation

## 2017-03-01 DIAGNOSIS — L89322 Pressure ulcer of left buttock, stage 2: Secondary | ICD-10-CM | POA: Insufficient documentation

## 2017-03-01 DIAGNOSIS — S91104A Unspecified open wound of right lesser toe(s) without damage to nail, initial encounter: Secondary | ICD-10-CM | POA: Diagnosis not present

## 2017-03-01 DIAGNOSIS — E785 Hyperlipidemia, unspecified: Secondary | ICD-10-CM | POA: Insufficient documentation

## 2017-03-01 DIAGNOSIS — I2510 Atherosclerotic heart disease of native coronary artery without angina pectoris: Secondary | ICD-10-CM | POA: Diagnosis not present

## 2017-03-01 DIAGNOSIS — Z79899 Other long term (current) drug therapy: Secondary | ICD-10-CM | POA: Insufficient documentation

## 2017-03-01 DIAGNOSIS — Z7982 Long term (current) use of aspirin: Secondary | ICD-10-CM | POA: Diagnosis not present

## 2017-03-01 DIAGNOSIS — I739 Peripheral vascular disease, unspecified: Secondary | ICD-10-CM | POA: Insufficient documentation

## 2017-03-01 DIAGNOSIS — L97829 Non-pressure chronic ulcer of other part of left lower leg with unspecified severity: Secondary | ICD-10-CM | POA: Diagnosis not present

## 2017-03-02 NOTE — Progress Notes (Signed)
[name], [name] [name] ([id number]) Visit Report for 03/01/2017 Arrival Information Details Patient Name: [name] [name], [name] [name]. Date of Service: 03/01/2017 3:30 PM Medical Record Number: [id number] Patient Account Number: [id number] Date of Birth/Sex: 06/18/1923 (83 y.o. Female) Treating RN: Carolyne Fiscal, Debi Primary Care Jasmyne Lodato: Celedonio Miyamoto Other Clinician: Referring Dameka Younker: Celedonio Miyamoto Treating Gerri Acre/Extender: Frann Rider in Treatment: 2 Visit Information History Since Last Visit All ordered tests and consults were completed: No Patient Arrived: Wheel Chair Added or deleted any medications: No Arrival Time: 15:56 Any new allergies or adverse reactions: No Accompanied By: daughter Had a fall or experienced change in No Transfer Assistance: EasyPivot Patient activities of daily living that may affect Lift risk of falls: Patient Identification Verified: Yes Signs or symptoms of abuse/neglect since last No Secondary Verification Process Yes visito Completed: Hospitalized since last visit: No Patient Requires Transmission- No Pain Present Now: No Based Precautions: Patient Has Alerts: Yes Patient Alerts: Patient on Blood Thinner Plavix Electronic Signature(s) Signed: 03/01/2017 5:15:20 PM By: Alric Quan Entered By: Alric Quan on 03/01/2017 16:01:25 [name] [name] ([id number]) -------------------------------------------------------------------------------- Clinic Level of Care Assessment Details Patient Name: [name] [name] Date of Service: 03/01/2017 3:30 PM Medical Record Number: [id number] Patient Account Number: [id number] Date of Birth/Sex: [birth year] (83 y.o. Female) Treating RN: Carolyne Fiscal, Debi Primary Care Cailan General: Celedonio Miyamoto Other Clinician: Referring [name] [name]: Celedonio Miyamoto Treating Evin Loiseau/Extender: Frann Rider in Treatment: 2 Clinic Level of Care Assessment Items TOOL 4 Quantity Score X - Use when only an EandM is performed on  FOLLOW-UP visit 1 0 ASSESSMENTS - Nursing Assessment / Reassessment X - Reassessment of Co-morbidities (includes updates in patient status) 1 10 X - Reassessment of Adherence to Treatment Plan 1 5 ASSESSMENTS - Wound and Skin Assessment / Reassessment []  - Simple Wound Assessment / Reassessment - one wound 0 X - Complex Wound Assessment / Reassessment - multiple wounds 3 5 []  - Dermatologic / Skin Assessment (not related to wound area) 0 ASSESSMENTS - Focused Assessment []  - Circumferential Edema Measurements - multi extremities 0 []  - Nutritional Assessment / Counseling / Intervention 0 []  - Lower Extremity Assessment (monofilament, tuning fork, pulses) 0 []  - Peripheral Arterial Disease Assessment (using hand held doppler) 0 ASSESSMENTS - Ostomy and/or Continence Assessment and Care []  - Incontinence Assessment and Management 0 []  - Ostomy Care Assessment and Management (repouching, etc.) 0 PROCESS - Coordination of Care []  - Simple Patient / Family Education for ongoing care 0 X - Complex (extensive) Patient / Family Education for ongoing care 1 20 X - Staff obtains Programmer, systems, Records, Test Results / Process Orders 1 10 X - Staff telephones HHA, Nursing Homes / Clarify orders / etc 1 10 []  - Routine Transfer to another Facility (non-emergent condition) 0 JURNEE, NAKAYAMA ([id number]) []  - Routine Hospital Admission (non-emergent condition) 0 []  - New Admissions / Biomedical engineer / Ordering NPWT, Apligraf, etc. 0 []  - Emergency Hospital Admission (emergent condition) 0 X - Simple Discharge Coordination 1 10 []  - Complex (extensive) Discharge Coordination 0 PROCESS - Special Needs []  - Pediatric / Minor Patient Management 0 []  - Isolation Patient Management 0 []  - Hearing / Language / Visual special needs 0 []  - Assessment of Community assistance (transportation, D/C planning, etc.) 0 []  - Additional assistance / Altered mentation 0 []  - Support Surface(s) Assessment (bed,  cushion, seat, etc.) 0 INTERVENTIONS - Wound Cleansing / Measurement []  - Simple Wound Cleansing - one wound 0 X - Complex Wound Cleansing - multiple wounds 3 5  X - Wound Imaging (photographs - any number of wounds) 1 5 []  - Wound Tracing (instead of photographs) 0 []  - Simple Wound Measurement - one wound 0 X - Complex Wound Measurement - multiple wounds 3 5 INTERVENTIONS - Wound Dressings X - Small Wound Dressing one or multiple wounds 2 10 X - Medium Wound Dressing one or multiple wounds 1 15 []  - Large Wound Dressing one or multiple wounds 0 X - Application of Medications - topical 1 5 []  - Application of Medications - injection 0 INTERVENTIONS - Miscellaneous []  - External ear exam 0 [name], KREIGER. ([id number]) []  - Specimen Collection (cultures, biopsies, blood, body fluids, etc.) 0 []  - Specimen(s) / Culture(s) sent or taken to Lab for analysis 0 []  - Patient Transfer (multiple staff / Harrel Lemon Lift / Similar devices) 0 []  - Simple Staple / Suture removal (25 or less) 0 []  - Complex Staple / Suture removal (26 or more) 0 []  - Hypo / Hyperglycemic Management (close monitor of Blood Glucose) 0 []  - Ankle / Brachial Index (ABI) - do not check if billed separately 0 X - Vital Signs 1 5 Has the patient been seen at the hospital within the last three years: Yes Total Score: 160 Level Of Care: New/Established - Level 5 Electronic Signature(s) Signed: 03/01/2017 5:15:20 PM By: Alric Quan Entered By: Alric Quan on 03/01/2017 17:03:34 [name] [name] ([id number]) -------------------------------------------------------------------------------- Encounter Discharge Information Details Patient Name: [name] [name] Date of Service: 03/01/2017 3:30 PM Medical Record Number: [id number] Patient Account Number: [id number] Date of Birth/Sex: 1923/02/06 (83 y.o. Female) Treating RN: Carolyne Fiscal, Debi Primary Care [name]: Celedonio Miyamoto Other Clinician: Referring Jacobi Ryant: Celedonio Miyamoto Treating [name] [name]/Extender: Frann Rider in Treatment: 2 Encounter Discharge Information Items Discharge Pain Level: 0 Discharge Condition: Stable Ambulatory Status: Wheelchair Discharge Destination: Nursing Home Transportation: Private Auto Accompanied By: daughter Schedule Follow-up Appointment: Yes Medication Reconciliation completed and provided to Patient/Care No Endy Easterly: Provided on Clinical Summary of Care: 03/01/2017 Form Type Recipient Paper Patient DB Electronic Signature(s) Signed: 03/01/2017 5:15:20 PM By: Alric Quan Previous Signature: 03/01/2017 4:46:22 PM Version By: Ruthine Dose Entered By: Alric Quan on 03/01/2017 17:07:15 [name] [name] ([id number]) -------------------------------------------------------------------------------- Lower Extremity Assessment Details Patient Name: [name] [name] Date of Service: 03/01/2017 3:30 PM Medical Record Number: [id number] Patient Account Number: [id number] Date of Birth/Sex: Mar 10, 1923 (83 y.o. Female) Treating RN: Carolyne Fiscal, Debi Primary Care Leny Morozov: Celedonio Miyamoto Other Clinician: Referring Kharter Sestak: Celedonio Miyamoto Treating Sanjuanita Condrey/Extender: Frann Rider in Treatment: 2 Vascular Assessment Pulses: Dorsalis Pedis Palpable: [Left:No] Doppler Audible: [Left:Yes] Posterior Tibial Extremity colors, hair growth, and conditions: Extremity Color: [Left:Hyperpigmented] Temperature of Extremity: [Left:Cool] Capillary Refill: [Left:< 3 seconds] Toe Nail Assessment Left: Right: Thick: Yes Discolored: Yes Deformed: Yes Improper Length and Hygiene: Yes Electronic Signature(s) Signed: 03/01/2017 5:15:20 PM By: Alric Quan Entered By: Alric Quan on 03/01/2017 16:29:28 [name] [name] ([id number]) -------------------------------------------------------------------------------- Multi Wound Chart Details Patient Name: [name] [name] Date of Service: 03/01/2017 3:30 PM Medical  Record Number: [id number] Patient Account Number: [id number] Date of Birth/Sex: 03-27-1923 (83 y.o. Female) Treating RN: Carolyne Fiscal, Debi Primary Care Chrishawn Boley: Celedonio Miyamoto Other Clinician: Referring Gwenette Wellons: Celedonio Miyamoto Treating Myrtle Haller/Extender: Frann Rider in Treatment: 2 Vital Signs Height(in): 66 Pulse(bpm): 73 Weight(lbs): 144 Blood Pressure 152/50 (mmHg): Body Mass Index(BMI): 23 Temperature(F): 97.6 Respiratory Rate 16 (breaths/min): Photos: [1:No Photos] [2:No Photos] [3:No Photos] Wound Location: [1:Left Foot - Dorsal] [2:Left, Proximal, Anterior Lower Leg] [3:Left, Lateral Lower Leg] Wounding Event: [1:Gradually Appeared] [2:Gradually Appeared] [  3:Gradually Appeared] Primary Etiology: [1:To be determined] [2:Venous Leg Ulcer] [3:Venous Leg Ulcer] Comorbid History: [1:Anemia, Congestive Heart N/A Failure, Coronary Artery Disease, Hypertension, Peripheral Venous Disease, Neuropathy] [3:N/A] Date Acquired: [1:02/08/2017] [2:02/08/2017] [3:02/08/2017] Weeks of Treatment: [1:2] [2:2] [3:2] Wound Status: [1:Open] [2:Healed - Epithelialized] [3:Healed - Epithelialized] Clustered Wound: [1:No] [2:Yes] [3:Yes] Measurements L x W x D 1.5x1.3x0.1 [2:0x0x0] [3:0x0x0] (cm) Area (cm) : [1:1.532] [2:0] [3:0] Volume (cm) : [1:0.153] [2:0] [3:0] % Reduction in Area: [1:13.30%] [2:100.00%] [3:100.00%] % Reduction in Volume: 13.60% [2:100.00%] [3:100.00%] Classification: [1:Partial Thickness] [2:Partial Thickness] [3:Partial Thickness] Exudate Amount: [1:Large] [2:N/A] [3:N/A] Exudate Type: [1:Serosanguineous] [2:N/A] [3:N/A] Exudate Color: [1:red, brown] [2:N/A] [3:N/A] Wound Margin: [1:Distinct, outline attached N/A] [3:N/A] Granulation Amount: [1:None Present (0%)] [2:N/A] [3:N/A] Necrotic Amount: [1:Large (67-100%)] [2:N/A] [3:N/A] Epithelialization: [1:None] [2:N/A] [3:N/A] Periwound Skin Texture: No Abnormalities Noted No Abnormalities Noted [1:Maceration: Yes]  [2:No Abnormalities Noted] [3:No Abnormalities Noted No Abnormalities Noted] Periwound Skin Moisture: Periwound Skin Color: No Abnormalities Noted No Abnormalities Noted No Abnormalities Noted Temperature: No Abnormality N/A N/A Tenderness on Yes No No Palpation: Wound Preparation: Ulcer Cleansing: N/A N/A Rinsed/Irrigated with Saline Topical Anesthetic Applied: Other: lidocaine 4% Wound Number: 4 5 N/A Photos: No Photos No Photos N/A Wound Location: Left Gluteus Right Toe Second N/A Wounding Event: Pressure Injury Gradually Appeared N/A Primary Etiology: Pressure Ulcer To be determined N/A Comorbid History: Anemia, Congestive Heart Anemia, Congestive Heart N/A Failure, Coronary Artery Failure, Coronary Artery Disease, Hypertension, Disease, Hypertension, Peripheral Venous Peripheral Venous Disease, Neuropathy Disease, Neuropathy Date Acquired: 01/18/2017 02/08/2017 N/A Weeks of Treatment: 2 0 N/A Wound Status: Open Open N/A Clustered Wound: No No N/A Measurements L x W x D 0.2x0.3x0.1 0.9x1.2x0.1 N/A (cm) Area (cm) : 0.047 0.848 N/A Volume (cm) : 0.005 0.085 N/A % Reduction in Area: 83.40% N/A N/A % Reduction in Volume: 82.10% N/A N/A Classification: Category/Stage II Partial Thickness N/A Exudate Amount: Medium Large N/A Exudate Type: Serous Serous N/A Exudate Color: amber amber N/A Wound Margin: Distinct, outline attached Distinct, outline attached N/A Granulation Amount: None Present (0%) None Present (0%) N/A Necrotic Amount: Large (67-100%) Large (67-100%) N/A Epithelialization: None None N/A Periwound Skin Texture: No Abnormalities Noted No Abnormalities Noted N/A Periwound Skin No Abnormalities Noted No Abnormalities Noted N/A Moisture: Periwound Skin Color: No Abnormalities Noted No Abnormalities Noted N/A Temperature: No Abnormality No Abnormality N/A Tenderness on Yes Yes N/A Palpation: KAMALJIT, HIZER ([id number]) Wound Preparation: Ulcer  Cleansing: Ulcer Cleansing: N/A Rinsed/Irrigated with Rinsed/Irrigated with Saline Saline Topical Anesthetic Topical Anesthetic Applied: Other: lidocaine Applied: Other: lidocaine 4% 4% Treatment Notes Electronic Signature(s) Signed: 03/01/2017 4:42:23 PM By: Christin Fudge MD, FACS Entered By: Christin Fudge on 03/01/2017 16:42:23 [name] [name] ([id number]) -------------------------------------------------------------------------------- [place] Details Patient Name: [name] [name], [name] [name] Date of Service: 03/01/2017 3:30 PM Medical Record Number: [id number] Patient Account Number: [id number] Date of Birth/Sex: 10/20/1922 (83 y.o. Female) Treating RN: Carolyne Fiscal, Debi Primary Care Shayley Medlin: Celedonio Miyamoto Other Clinician: Referring Jaydyn Bozzo: Celedonio Miyamoto Treating Freddy Spadafora/Extender: Frann Rider in Treatment: 2 Active Inactive ` Abuse / Safety / Falls / Self Care Management Nursing Diagnoses: Potential for falls Goals: Patient will remain injury free Date Initiated: 02/11/2017 Target Resolution Date: 05/22/2017 Goal Status: Active Interventions: Assess fall risk on admission and as needed Assess self care needs on admission and as needed Notes: ` Nutrition Nursing Diagnoses: Imbalanced nutrition Potential for alteratiion in Nutrition/Potential for imbalanced nutrition Goals: Patient/caregiver agrees to and verbalizes understanding of need to use nutritional supplements and/or vitamins as prescribed  Date Initiated: 02/11/2017 Target Resolution Date: 05/22/2017 Goal Status: Active Interventions: Assess patient nutrition upon admission and as needed per policy Notes: ` Orientation to the [place], [place] ([id number]) Nursing Diagnoses: Knowledge deficit related to the wound healing center program Goals: Patient/caregiver will verbalize understanding of the [facility] Program Date Initiated: 02/11/2017 Target Resolution Date:  02/20/2017 Goal Status: Active Interventions: Provide education on orientation to the wound center Notes: ` Pain, Acute or Chronic Nursing Diagnoses: Pain, acute or chronic: actual or potential Potential alteration in comfort, pain Goals: Patient/caregiver will verbalize adequate pain control between visits Date Initiated: 02/11/2017 Target Resolution Date: 05/22/2017 Goal Status: Active Interventions: Assess comfort goal upon admission Complete pain assessment as per visit requirements Notes: ` Pressure Nursing Diagnoses: Knowledge deficit related to causes and risk factors for pressure ulcer development Knowledge deficit related to management of pressures ulcers Potential for impaired tissue integrity related to pressure, friction, moisture, and shear Goals: Patient/caregiver will verbalize risk factors for pressure ulcer development Date Initiated: 02/11/2017 Target Resolution Date: 05/22/2017 Goal Status: Active Interventions: Assess: immobility, friction, shearing, incontinence upon admission and as needed SALIMATA, CHRISTENSON ([id number]) Notes: ` Wound/Skin Impairment Nursing Diagnoses: Impaired tissue integrity Knowledge deficit related to ulceration/compromised skin integrity Goals: Ulcer/skin breakdown will have a volume reduction of 80% by week 12 Date Initiated: 02/11/2017 Target Resolution Date: 05/22/2017 Goal Status: Active Interventions: Assess patient/caregiver ability to perform ulcer/skin care regimen upon admission and as needed Assess ulceration(s) every visit Notes: Electronic Signature(s) Signed: 03/01/2017 5:15:20 PM By: Alric Quan Entered By: Alric Quan on 03/01/2017 [place], [place]. ([id number]) -------------------------------------------------------------------------------- Pain Assessment Details Patient Name: [name] [name] Date of Service: 03/01/2017 3:30 PM Medical Record Number: [id number] Patient Account Number:  [id number] Date of Birth/Sex: [birth year] (83 y.o. Female) Treating RN: Carolyne Fiscal, Debi Primary Care Monserrath Junio: Celedonio Miyamoto Other Clinician: Referring Kaiulani Sitton: Celedonio Miyamoto Treating Gennie Dib/Extender: Frann Rider in Treatment: 2 Active Problems Location of Pain Severity and Description of Pain Patient Has Paino No Site Locations With Dressing Change: No Pain Management and Medication Current Pain Management: Electronic Signature(s) Signed: 03/01/2017 5:15:20 PM By: Alric Quan Entered By: Alric Quan on 03/01/2017 [place], [place]. ([id number]) -------------------------------------------------------------------------------- Patient/Caregiver Education Details Patient Name: [name] [name] Date of Service: 03/01/2017 3:30 PM Medical Record Number: [id number] Patient Account Number: [id number] Date of Birth/Gender: 12/15/1922 (83 y.o. Female) Treating RN: Ahmed Prima Primary Care Physician: Celedonio Miyamoto Other Clinician: Referring Physician: Celedonio Miyamoto Treating Physician/Extender: Frann Rider in Treatment: 2 Education Assessment Education Provided To: Patient and Caregiver Education Topics Provided Wound/Skin Impairment: Handouts: Other: change dressing as ordered Methods: Demonstration, Explain/Verbal Responses: State content correctly Electronic Signature(s) Signed: 03/01/2017 5:15:20 PM By: Alric Quan Entered By: Alric Quan on 03/01/2017 17:07:31 [name] [name] ([id number]) -------------------------------------------------------------------------------- Wound Assessment Details Patient Name: [name] [name] Date of Service: 03/01/2017 3:30 PM Medical Record Number: [id number] Patient Account Number: [id number] Date of Birth/Sex: 18-Sep-1923 (83 y.o. Female) Treating RN: Carolyne Fiscal, Debi Primary Care Glenola Wheat: Celedonio Miyamoto Other Clinician: Referring Jaelin Fackler: Celedonio Miyamoto Treating Mariabelen Pressly/Extender: Frann Rider in  Treatment: 2 Wound Status Wound Number: 1 Primary To be determined Etiology: Wound Location: Left Foot - Dorsal Wound Open Wounding Event: Gradually Appeared Status: Date Acquired: 02/08/2017 Comorbid Anemia, Congestive Heart Failure, Weeks Of Treatment: 2 History: Coronary Artery Disease, Hypertension, Clustered Wound: No Peripheral Venous Disease, Neuropathy Photos Photo Uploaded By: Alric Quan on 03/01/2017 17:11:27 Wound Measurements Length: (cm) 1.5 Width: (cm) 1.3 Depth: (cm) 0.1 Area: (cm)  1.532 Volume: (cm) 0.153 % Reduction in Area: 13.3% % Reduction in Volume: 13.6% Epithelialization: None Tunneling: No Undermining: No Wound Description Classification: Partial Thickness Foul Odor Aft Wound Margin: Distinct, outline attached Slough/Fibrin Exudate Amount: Large Exudate Type: Serosanguineous Exudate Color: red, brown er Cleansing: No o Yes Wound Bed Granulation Amount: None Present (0%) Necrotic Amount: Large (67-100%) Necrotic Quality: [address], [place]. ([id number]) Periwound Skin Texture Texture Color No Abnormalities Noted: No No Abnormalities Noted: No Moisture Temperature / Pain No Abnormalities Noted: No Temperature: No Abnormality Maceration: Yes Tenderness on Palpation: Yes Wound Preparation Ulcer Cleansing: Rinsed/Irrigated with Saline Topical Anesthetic Applied: Other: lidocaine 4%, Treatment Notes Wound #1 (Left, Dorsal Foot) 1. Cleansed with: Clean wound with Normal Saline 2. Anesthetic Topical Lidocaine 4% cream to wound bed prior to debridement 4. Dressing Applied: Santyl Ointment 5. Secondary Dressing Applied ABD Pad Dry Gauze Kerlix/Conform 7. Secured with Recruitment consultant) Signed: 03/01/2017 5:15:20 PM By: Alric Quan Entered By: Alric Quan on 03/01/2017 16:15:33 [name] [name] ([id number]) -------------------------------------------------------------------------------- Wound  Assessment Details Patient Name: [name] [name] Date of Service: 03/01/2017 3:30 PM Medical Record Number: [id number] Patient Account Number: [id number] Date of Birth/Sex: Aug 28, 1923 (83 y.o. Female) Treating RN: Carolyne Fiscal, Debi Primary Care Tyrin Herbers: Celedonio Miyamoto Other Clinician: Referring Abdimalik Mayorquin: Celedonio Miyamoto Treating Akeria Hedstrom/Extender: Frann Rider in Treatment: 2 Wound Status Wound Number: 2 Primary Etiology: Venous Leg Ulcer Wound Location: Left, Proximal, Anterior Lower Wound Status: Healed - Epithelialized Leg Wounding Event: Gradually Appeared Date Acquired: 02/08/2017 Weeks Of Treatment: 2 Clustered Wound: Yes Photos Photo Uploaded By: Alric Quan on 03/01/2017 17:11:28 Wound Measurements Length: (cm) 0 % Reduction Width: (cm) 0 % Reduction Depth: (cm) 0 Area: (cm) 0 Volume: (cm) 0 in Area: 100% in Volume: 100% Wound Description Classification: Partial Thickness Periwound Skin Texture Texture Color No Abnormalities Noted: No No Abnormalities Noted: No Moisture No Abnormalities Noted: No Electronic Signature(s) Signed: 03/01/2017 5:15:20 PM By: Zandra Abts, Lendon Collar ([id number]) Entered By: Alric Quan on 03/01/2017 16:12:03 [name] [name] ([id number]) -------------------------------------------------------------------------------- Wound Assessment Details Patient Name: [name] [name] Date of Service: 03/01/2017 3:30 PM Medical Record Number: [id number] Patient Account Number: [id number] Date of Birth/Sex: 1923/07/19 (83 y.o. Female) Treating RN: Carolyne Fiscal, Debi Primary Care Mitra Duling: Celedonio Miyamoto Other Clinician: Referring Symir Mah: Celedonio Miyamoto Treating Ruperto Kiernan/Extender: Frann Rider in Treatment: 2 Wound Status Wound Number: 3 Primary Etiology: Venous Leg Ulcer Wound Location: Left, Lateral Lower Leg Wound Status: Healed - Epithelialized Wounding Event: Gradually Appeared Date Acquired: 02/08/2017 Weeks Of  Treatment: 2 Clustered Wound: Yes Photos Photo Uploaded By: Alric Quan on 03/01/2017 17:12:12 Wound Measurements Length: (cm) 0 % Reduction i Width: (cm) 0 % Reduction i Depth: (cm) 0 Area: (cm) 0 Volume: (cm) 0 n Area: 100% n Volume: 100% Wound Description Classification: Partial Thickness Periwound Skin Texture Texture Color No Abnormalities Noted: No No Abnormalities Noted: No Moisture No Abnormalities Noted: No Electronic Signature(s) Signed: 03/01/2017 5:15:20 PM By: Zandra Abts, Lendon Collar ([id number]) Entered By: Alric Quan on 03/01/2017 16:12:04 [name] [name] ([id number]) -------------------------------------------------------------------------------- Wound Assessment Details Patient Name: [name] [name] Date of Service: 03/01/2017 3:30 PM Medical Record Number: [id number] Patient Account Number: [id number] Date of Birth/Sex: Nov 18, 1922 (83 y.o. Female) Treating RN: Carolyne Fiscal, Debi Primary Care Karrin Eisenmenger: Celedonio Miyamoto Other Clinician: Referring Reona Zendejas: Celedonio Miyamoto Treating Dartagnan Beavers/Extender: Frann Rider in Treatment: 2 Wound Status Wound Number: 4 Primary Pressure Ulcer Etiology: Wound Location: Left Gluteus Wound Open Wounding Event: Pressure Injury Status: Date Acquired: 01/18/2017 Comorbid  Anemia, Congestive Heart Failure, Weeks Of Treatment: 2 History: Coronary Artery Disease, Hypertension, Clustered Wound: No Peripheral Venous Disease, Neuropathy Photos Photo Uploaded By: Alric Quan on 03/01/2017 17:12:12 Wound Measurements Length: (cm) 0.2 Width: (cm) 0.3 Depth: (cm) 0.1 Area: (cm) 0.047 Volume: (cm) 0.005 % Reduction in Area: 83.4% % Reduction in Volume: 82.1% Epithelialization: None Tunneling: No Undermining: No Wound Description Classification: Category/Stage II Foul Odor Aft Wound Margin: Distinct, outline attached Slough/Fibrin Exudate Amount: Medium Exudate Type: Serous Exudate Color:  amber er Cleansing: No o Yes Wound Bed Granulation Amount: None Present (0%) Necrotic Amount: Large (67-100%) Necrotic Quality: [address] [place]. ([id number]) Periwound Skin Texture Texture Color No Abnormalities Noted: No No Abnormalities Noted: No Moisture Temperature / Pain No Abnormalities Noted: No Temperature: No Abnormality Tenderness on Palpation: Yes Wound Preparation Ulcer Cleansing: Rinsed/Irrigated with Saline Topical Anesthetic Applied: Other: lidocaine 4%, Treatment Notes Wound #4 (Left Gluteus) 1. Cleansed with: Clean wound with Normal Saline 2. Anesthetic Topical Lidocaine 4% cream to wound bed prior to debridement 3. Peri-wound Care: Skin Prep 4. Dressing Applied: Aquacel Ag 5. Secondary Dressing Applied Bordered Foam Dressing Electronic Signature(s) Signed: 03/01/2017 5:15:20 PM By: Alric Quan Entered By: Alric Quan on 03/01/2017 [place], [place]. ([id number]) -------------------------------------------------------------------------------- Wound Assessment Details Patient Name: [name] [name] Date of Service: 03/01/2017 3:30 PM Medical Record Number: [id number] Patient Account Number: [id number] Date of Birth/Sex: Mar 21, 1923 (83 y.o. Female) Treating RN: Carolyne Fiscal, Debi Primary Care Koralee Wedeking: Celedonio Miyamoto Other Clinician: Referring Myron Lona: Celedonio Miyamoto Treating Demetrio Leighty/Extender: Frann Rider in Treatment: 2 Wound Status Wound Number: 5 Primary To be determined Etiology: Wound Location: Right Toe Second Wound Open Wounding Event: Gradually Appeared Status: Date Acquired: 02/08/2017 Comorbid Anemia, Congestive Heart Failure, Weeks Of Treatment: 0 History: Coronary Artery Disease, Hypertension, Clustered Wound: No Peripheral Venous Disease, Neuropathy Photos Photo Uploaded By: Alric Quan on 03/01/2017 17:12:54 Wound Measurements Length: (cm) 0.9 Width: (cm) 1.2 Depth: (cm) 0.1 Area: (cm)  0.848 Volume: (cm) 0.085 % Reduction in Area: % Reduction in Volume: Epithelialization: None Tunneling: No Undermining: No Wound Description Classification: Partial Thickness Foul Odor Aft Wound Margin: Distinct, outline attached Slough/Fibrin Exudate Amount: Large Exudate Type: Serous Exudate Color: amber er Cleansing: No o Yes Wound Bed Granulation Amount: None Present (0%) Necrotic Amount: Large (67-100%) Necrotic Quality: [address], Areatha O. ([id number]) Periwound Skin Texture Texture Color No Abnormalities Noted: No No Abnormalities Noted: No Moisture Temperature / Pain No Abnormalities Noted: No Temperature: No Abnormality Tenderness on Palpation: Yes Wound Preparation Ulcer Cleansing: Rinsed/Irrigated with Saline Topical Anesthetic Applied: Other: lidocaine 4%, Electronic Signature(s) Signed: 03/01/2017 5:15:20 PM By: Alric Quan Entered By: Alric Quan on 03/01/2017 16:14:51 [name] [name] ([id number]) -------------------------------------------------------------------------------- Vitals Details Patient Name: [name] [name] Date of Service: 03/01/2017 3:30 PM Medical Record Number: [id number] Patient Account Number: [id number] Date of Birth/Sex: [birth year] (83 y.o. Female) Treating RN: Carolyne Fiscal, Debi Primary Care Naiomy Watters: Celedonio Miyamoto Other Clinician: Referring Fannie Gathright: Celedonio Miyamoto Treating Lenorris Karger/Extender: Frann Rider in Treatment: 2 Vital Signs Time Taken: 16:01 Temperature (F): 97.6 Height (in): 66 Pulse (bpm): 73 Weight (lbs): 144 Respiratory Rate (breaths/min): 16 Body Mass Index (BMI): 23.2 Blood Pressure (mmHg): 152/50 Reference Range: 80 - 120 mg / dl Electronic Signature(s) Signed: 03/01/2017 5:15:20 PM By: Alric Quan Entered By: Alric Quan on 03/01/2017 16:01:51

## 2017-03-03 DIAGNOSIS — L89892 Pressure ulcer of other site, stage 2: Secondary | ICD-10-CM | POA: Diagnosis not present

## 2017-03-03 DIAGNOSIS — I5033 Acute on chronic diastolic (congestive) heart failure: Secondary | ICD-10-CM | POA: Diagnosis not present

## 2017-03-03 DIAGNOSIS — I130 Hypertensive heart and chronic kidney disease with heart failure and stage 1 through stage 4 chronic kidney disease, or unspecified chronic kidney disease: Secondary | ICD-10-CM | POA: Diagnosis not present

## 2017-03-03 DIAGNOSIS — L89152 Pressure ulcer of sacral region, stage 2: Secondary | ICD-10-CM | POA: Diagnosis not present

## 2017-03-03 DIAGNOSIS — E1122 Type 2 diabetes mellitus with diabetic chronic kidney disease: Secondary | ICD-10-CM | POA: Diagnosis not present

## 2017-03-03 DIAGNOSIS — I739 Peripheral vascular disease, unspecified: Secondary | ICD-10-CM | POA: Diagnosis not present

## 2017-03-03 DIAGNOSIS — N189 Chronic kidney disease, unspecified: Secondary | ICD-10-CM | POA: Diagnosis not present

## 2017-03-03 DIAGNOSIS — J441 Chronic obstructive pulmonary disease with (acute) exacerbation: Secondary | ICD-10-CM | POA: Diagnosis not present

## 2017-03-04 NOTE — Progress Notes (Addendum)
ESPYN, RADWAN ([id number]) Visit Report for 03/01/2017 Chief Complaint Document Details Patient Name: [name] [name], [name] [name]. Date of Service: 03/01/2017 3:30 PM Medical Record Number: [id number] Patient Account Number: [id number] Date of Birth/Sex: 10-26-1922 (83 y.o. Female) Treating RN: Carolyne Fiscal, Debi Primary Care Provider: Celedonio Miyamoto Other Clinician: Referring Provider: Celedonio Miyamoto Treating Provider/Extender: Frann Rider in Treatment: 2 Information Obtained from: Patient Chief Complaint Patient is at the clinic for treatment of an open pressure ulcer to her left gluteal area and other wounds on her left lower leg and foot, for 3 weeks now Electronic Signature(s) Signed: 03/01/2017 4:42:31 PM By: Christin Fudge MD, FACS Entered By: Christin Fudge on 03/01/2017 16:42:30 [name] [name] ([id number]) -------------------------------------------------------------------------------- HPI Details Patient Name: [name] [name] Date of Service: 03/01/2017 3:30 PM Medical Record Number: [id number] Patient Account Number: [id number] Date of Birth/Sex: May 25, 1923 (83 y.o. Female) Treating RN: Carolyne Fiscal, Debi Primary Care Provider: Celedonio Miyamoto Other Clinician: Referring Provider: Celedonio Miyamoto Treating Provider/Extender: Frann Rider in Treatment: 2 History of Present Illness Location: left gluteal area, left foot, left lower extremity Quality: Patient reports experiencing a sharp pain to affected area(s). Severity: Patient states wound are getting worse. Duration: Patient has had the wound for < 3 weeks prior to presenting for treatment Timing: Pain in wound is Intermittent (comes and goes Context: The wound appeared gradually over time Modifying Factors: Other treatment(s) tried include:local care with Unna boots Associated Signs and Symptoms: Patient reports having increase swelling. HPI Description: 83 year old patient referred to as by her nurse sent [facility] assisted  [facility] for ulcers both legs,decubitus ulcer on the sacrum which she's had for about 3 weeks. The daughter is at the bedside does not know how these occurred and there was no fall or trauma Her past medical history is suggestive of chronic diastolic heart failure, hypertension, peripheral arterial disease, chronic venous insufficiency, generalized anxiety disorder, and has never been a smoker. She is also status post appendectomy, cholecystectomy, hysterectomy and left hip replacement. She is also had a fracture of her left humerus in November 2017 and has been treated nonsurgically with a Sarmiento brace. 03/01/2017 -- since she was seen here the last time she has been admitted to the hospital between May 3 and 02/21/2017 with COPD exacerbation and pressure injuries to the skin. she was treated with doxycycline as she had an MRSA positive sputum but had no pneumonia. She was also diuresed with IV Lasix and this helped his CHF. Electronic Signature(s) Signed: 03/01/2017 4:44:40 PM By: Christin Fudge MD, FACS Entered By: Christin Fudge on 03/01/2017 16:44:40 [name] [name] ([id number]) -------------------------------------------------------------------------------- Physical Exam Details Patient Name: [name] [name], [name] [name] Date of Service: 03/01/2017 3:30 PM Medical Record Number: [id number] Patient Account Number: [id number] Date of Birth/Sex: [birth year] (83 y.o. Female) Treating RN: Carolyne Fiscal, Debi Primary Care Provider: Celedonio Miyamoto Other Clinician: Referring Provider: Celedonio Miyamoto Treating Provider/Extender: Frann Rider in Treatment: 2 Constitutional . Pulse regular. Respirations normal and unlabored. Afebrile. . Eyes Nonicteric. Reactive to light. Ears, Nose, Mouth, and Throat Lips, teeth, and gums WNL.Marland Kitchen Moist mucosa without lesions. Neck supple and nontender. No palpable supraclavicular or cervical adenopathy. Normal sized without goiter. Respiratory WNL. No  retractions.. Cardiovascular Pedal Pulses WNL. No clubbing, cyanosis or edema. Chest Breasts symmetical and no nipple discharge.. Breast tissue WNL, no masses, lumps, or tenderness.. Lymphatic No adneopathy. No adenopathy. No adenopathy. Musculoskeletal Adexa without tenderness or enlargement.. Digits and nails w/o clubbing, cyanosis, infection, petechiae, ischemia, or inflammatory conditions.. Integumentary (Hair, Skin) No suspicious lesions. No  crepitus or fluctuance. No peri-wound warmth or erythema. No masses.Marland Kitchen Psychiatric Judgement and insight Intact.. No evidence of depression, anxiety, or agitation.. Notes the stage II pressure ulcer on her left gluteal area has healed but the sacrum wound is still open now. Wound on the dorsum of her left foot and on the right second toe have superficial eschar which was easily washed out with moist saline gauze. However there is deeper subcutaneous debris which is too tender to sharply debride and we will apply Santyl ointment for chemical debridement. Electronic Signature(s) Signed: 03/01/2017 4:45:54 PM By: Christin Fudge MD, FACS Entered By: Christin Fudge on 03/01/2017 16:45:52 [name] [name] ([id number]) -------------------------------------------------------------------------------- Physician Orders Details Patient Name: [name] [name], [name] [name] Date of Service: 03/01/2017 3:30 PM Medical Record Number: [id number] Patient Account Number: [id number] Date of Birth/Sex: Nov 23, 1922 (83 y.o. Female) Treating RN: Carolyne Fiscal, Debi Primary Care Provider: Celedonio Miyamoto Other Clinician: Referring Provider: Celedonio Miyamoto Treating Provider/Extender: Frann Rider in Treatment: 2 Verbal / Phone Orders: Yes Clinician: Pinkerton, Debi Read Back and Verified: Yes Diagnosis Coding Wound Cleansing Wound #1 Left,Dorsal Foot o Clean wound with Normal Saline. o Cleanse wound with mild soap and water Wound #4 Left Gluteus o Clean wound with Normal  Saline. o Cleanse wound with mild soap and water Wound #5 Right,Dorsal Toe Second o Clean wound with Normal Saline. o Cleanse wound with mild soap and water Anesthetic Wound #1 Left,Dorsal Foot o Topical Lidocaine 4% cream applied to wound bed prior to debridement - for clinic use Wound #5 Right,Dorsal Toe Second o Topical Lidocaine 4% cream applied to wound bed prior to debridement - for clinic use Wound #4 Left Gluteus o Topical Lidocaine 4% cream applied to wound bed prior to debridement - for clinic use Skin Barriers/Peri-Wound Care Wound #4 Left Gluteus o Skin Prep Primary Wound Dressing Wound #1 Left,Dorsal Foot o Santyl Ointment Wound #4 Left Gluteus o Aquacel Ag - or equivalent to Wound #5 Right,Dorsal Toe Second 1 [name] [name], [name] [name] ([id number]) Secondary Dressing Wound #1 Left,Dorsal Foot o ABD pad o Dry Gauze o Gauze and Kerlix/Conform Wound #4 Left Gluteus o Boardered Foam Dressing Wound #5 Right,Dorsal Toe Second o Dry Gauze o Conform/Kerlix Dressing Change Frequency Wound #1 Left,Dorsal Foot o Change dressing every other day. Wound #4 Left Gluteus o Change dressing every other day. Wound #5 Right,Dorsal Toe Second o Change dressing every other day. Follow-up Appointments Wound #1 Left,Dorsal Foot o Return Appointment in 1 week. Wound #4 Left Gluteus o Return Appointment in 1 week. Wound #5 Right,Dorsal Toe Second o Return Appointment in 1 week. Edema Control Wound #1 Left,Dorsal Foot o Elevate legs to the level of the heart and pump ankles as often as possible Wound #5 Right,Dorsal Toe Second o Elevate legs to the level of the heart and pump ankles as often as possible Off-Loading Wound #1 Left,Dorsal Foot o Turn and reposition every 2 hours Wound #4 Left Gluteus o Turn and reposition every 2 hours [name] [name], [name] [name]. ([id number]) Wound #5 Right,Dorsal Toe Second o Turn and  reposition every 2 hours Additional Orders / Instructions Wound #1 Left,Dorsal Foot o Increase protein intake. Wound #4 Left Gluteus o Increase protein intake. Wound #5 Right,Dorsal Toe Second o Increase protein intake. Home Health Wound #1 [facility] o Home Health Nurse may visit PRN to address patientos wound care needs. o FACE TO FACE ENCOUNTER: MEDICARE and MEDICAID PATIENTS: I certify that this patient is under my  care and that I had a face-to-face encounter that meets the physician face-to-face encounter requirements with this patient on this date. The encounter with the patient was in whole or in part for the following MEDICAL CONDITION: (primary reason for [place]) MEDICAL NECESSITY: I certify, that based on my findings, NURSING services are a medically necessary home health service. HOME BOUND STATUS: I certify that my clinical findings support that this patient is homebound (i.e., Due to illness or injury, pt requires aid of supportive devices such as crutches, cane, wheelchairs, walkers, the use of special transportation or the assistance of another person to leave their place of residence. There is a normal inability to leave the home and doing so requires considerable and taxing effort. Other absences are for medical reasons / religious services and are infrequent or of short duration when for other reasons). o If current dressing causes regression in wound condition, may D/C ordered dressing product/s and apply Normal Saline Moist Dressing daily until next [place] / Other MD appointment. [place] of regression in wound condition at [phone]. o Please direct any NON-WOUND related issues/requests for orders to patient's Primary Care Physician Wound #4 Left [place] Nurse may visit PRN to address patientos  wound care needs. o FACE TO FACE ENCOUNTER: MEDICARE and MEDICAID PATIENTS: I certify that this patient is under my care and that I had a face-to-face encounter that meets the physician face-to-face encounter requirements with this patient on this date. The encounter with the patient was in whole or in part for the following MEDICAL CONDITION: (primary reason for [place]) MEDICAL NECESSITY: I certify, that based on my findings, NURSING services are a medically necessary home health service. HOME BOUND STATUS: I certify that my clinical findings support that this patient is homebound (i.e., Due to illness or injury, pt requires aid of supportive devices such as crutches, cane, wheelchairs, walkers, the use of special transportation or the assistance of another person to leave their place of residence. There is a normal inability to leave the home and doing so requires considerable and taxing effort. [name] [name], [name] [name] ([id number]) absences are for medical reasons / religious services and are infrequent or of short duration when for other reasons). o If current dressing causes regression in wound condition, may D/C ordered dressing product/s and apply Normal Saline Moist Dressing daily until next [place] / Other MD appointment. [place] of regression in wound condition at [phone]. o Please direct any NON-WOUND related issues/requests for orders to patient's Primary Care Physician Wound #5 Right,Dorsal Toe [place] Nurse may visit PRN to address patientos wound care needs. o FACE TO FACE ENCOUNTER: MEDICARE and MEDICAID PATIENTS: I certify that this patient is under my care and that I had a face-to-face encounter that meets the physician face-to-face encounter requirements with this patient on this date. The encounter with the patient was in whole or in part for the following  MEDICAL CONDITION: (primary reason for [place]) MEDICAL NECESSITY: I certify, that based on my findings, NURSING services are a medically necessary home health service. HOME BOUND STATUS: I certify that my clinical findings support that this patient is homebound (i.e., Due to illness or injury, pt requires aid of supportive devices such as crutches, cane, wheelchairs, walkers, the use of special transportation or the assistance of another person to leave their  place of residence. There is a normal inability to leave the home and doing so requires considerable and taxing effort. Other absences are for medical reasons / religious services and are infrequent or of short duration when for other reasons). o If current dressing causes regression in wound condition, may D/C ordered dressing product/s and apply Normal Saline Moist Dressing daily until next [place] / Other MD appointment. [place] of regression in wound condition at [phone]. o Please direct any NON-WOUND related issues/requests for orders to patient's Primary Care Physician Medications-please add to medication list. Wound #1 Left,Dorsal Foot o Santyl Enzymatic Ointment o Other: - Vitamin C, Zinc, MVI Wound #4 Left Gluteus o Other: - Vitamin C, Zinc, MVI Wound #5 Right,Dorsal Toe Second o Santyl Enzymatic Ointment o Other: - Vitamin C, Zinc, MVI Electronic Signature(s) Signed: 03/01/2017 5:15:20 PM By: Alric Quan Signed: 03/03/2017 7:58:29 AM By: Christin Fudge MD, FACS Previous Signature: 03/01/2017 4:48:28 PM Version By: Christin Fudge MD, FACS Entered By: Alric Quan on 03/01/2017 17:04:41 [name] [name], [name] [name] ([id number], [name] [name] ([id number]) -------------------------------------------------------------------------------- Problem List Details Patient Name: GRACELYNN, BIRCHER. Date of Service: 03/01/2017 3:30 PM Medical Record Number: [id number] Patient  Account Number: [id number] Date of Birth/Sex: [birth year] (83 y.o. Female) Treating RN: Carolyne Fiscal, Debi Primary Care Provider: Celedonio Miyamoto Other Clinician: Referring Provider: Celedonio Miyamoto Treating Provider/Extender: Frann Rider in Treatment: 2 Active Problems ICD-10 Encounter Code Description Active Date Diagnosis L89.322 Pressure ulcer of left buttock, stage 2 02/11/2017 Yes L97.222 Non-pressure chronic ulcer of left calf with fat layer 02/11/2017 Yes exposed L97.522 Non-pressure chronic ulcer of other part of left foot with fat 02/11/2017 Yes layer exposed I73.9 Peripheral vascular disease, unspecified 02/11/2017 Yes Inactive Problems Resolved Problems Electronic Signature(s) Signed: 03/01/2017 4:41:52 PM By: Christin Fudge MD, FACS Entered By: Christin Fudge on 03/01/2017 16:41:52 [name] [name] ([id number]) -------------------------------------------------------------------------------- Progress Note Details Patient Name: [name] [name] Date of Service: 03/01/2017 3:30 PM Medical Record Number: [id number] Patient Account Number: [id number] Date of Birth/Sex: Jan 11, 1923 (83 y.o. Female) Treating RN: Carolyne Fiscal, Debi Primary Care Provider: Celedonio Miyamoto Other Clinician: Referring Provider: Celedonio Miyamoto Treating Provider/Extender: Frann Rider in Treatment: 2 Subjective Chief Complaint Information obtained from Patient Patient is at the clinic for treatment of an open pressure ulcer to her left gluteal area and other wounds on her left lower leg and foot, for 3 weeks now History of Present Illness (HPI) The following HPI elements were documented for the patient's wound: Location: left gluteal area, left foot, left lower extremity Quality: Patient reports experiencing a sharp pain to affected area(s). Severity: Patient states wound are getting worse. Duration: Patient has had the wound for < 3 weeks prior to presenting for treatment Timing: Pain in wound is  Intermittent (comes and goes Context: The wound appeared gradually over time Modifying Factors: Other treatment(s) tried include:local care with Unna boots Associated Signs and Symptoms: Patient reports having increase swelling. 83 year old patient referred to as by her nurse sent [facility] [facility] assisted [facility] for ulcers both legs,decubitus ulcer on the sacrum which she's had for about 3 weeks. The daughter is at the bedside does not know how these occurred and there was no fall or trauma Her past medical history is suggestive of chronic diastolic heart failure, hypertension, peripheral arterial disease, chronic venous insufficiency, generalized anxiety disorder, and has never been a smoker. She is also status post appendectomy, cholecystectomy, hysterectomy and left hip replacement. She is also had a fracture of her left humerus in  November 2017 and has been treated nonsurgically with a Sarmiento brace. 03/01/2017 -- since she was seen here the last time she has been admitted to the hospital between May 3 and 02/21/2017 with COPD exacerbation and pressure injuries to the skin. she was treated with doxycycline as she had an MRSA positive sputum but had no pneumonia. She was also diuresed with IV Lasix and this helped his CHF. Objective Constitutional Pulse regular. Respirations normal and unlabored. Afebrile. [name] [name], [name] [name] ([id number]) Vitals Time Taken: 4:01 PM, Height: 66 in, Weight: 144 lbs, BMI: 23.2, Temperature: 97.6 F, Pulse: 73 bpm, Respiratory Rate: 16 breaths/min, Blood Pressure: 152/50 mmHg. Eyes Nonicteric. Reactive to light. Ears, Nose, Mouth, and Throat Lips, teeth, and gums WNL.Marland Kitchen Moist mucosa without lesions. Neck supple and nontender. No palpable supraclavicular or cervical adenopathy. Normal sized without goiter. Respiratory WNL. No retractions.. Cardiovascular Pedal Pulses WNL. No clubbing, cyanosis or edema. Chest Breasts symmetical and no nipple discharge.. Breast  tissue WNL, no masses, lumps, or tenderness.. Lymphatic No adneopathy. No adenopathy. No adenopathy. Musculoskeletal Adexa without tenderness or enlargement.. Digits and nails w/o clubbing, cyanosis, infection, petechiae, ischemia, or inflammatory conditions.Marland Kitchen Psychiatric Judgement and insight Intact.. No evidence of depression, anxiety, or agitation.. General Notes: the stage II pressure ulcer on her left gluteal area has healed but the sacrum wound is still open now. Wound on the dorsum of her left foot and on the right second toe have superficial eschar which was easily washed out with moist saline gauze. However there is deeper subcutaneous debris which is too tender to sharply debride and we will apply Santyl ointment for chemical debridement. Integumentary (Hair, Skin) No suspicious lesions. No crepitus or fluctuance. No peri-wound warmth or erythema. No masses.. Wound #1 status is Open. Original cause of wound was Gradually Appeared. The wound is located on the Left,Dorsal Foot. The wound measures 1.5cm length x 1.3cm width x 0.1cm depth; 1.532cm^2 area and 0.153cm^3 volume. There is no tunneling or undermining noted. There is a large amount of serosanguineous drainage noted. The wound margin is distinct with the outline attached to the wound base. There is no granulation within the wound bed. There is a large (67-100%) amount of necrotic tissue within the wound bed including Adherent Slough. The periwound skin appearance exhibited: Maceration. Periwound temperature was noted as No Abnormality. The periwound has tenderness on palpation. Wound #2 status is Healed - Epithelialized. Original cause of wound was Gradually Appeared. The wound [name] [name], [name] [name] ([id number]) is located on the Left,Proximal,Anterior Lower Leg. The wound measures 0cm length x 0cm width x 0cm depth; 0cm^2 area and 0cm^3 volume. Wound #3 status is Healed - Epithelialized. Original cause of wound was Gradually  Appeared. The wound is located on the Left,Lateral Lower Leg. The wound measures 0cm length x 0cm width x 0cm depth; 0cm^2 area and 0cm^3 volume. Wound #4 status is Open. Original cause of wound was Pressure Injury. The wound is located on the Left Gluteus. The wound measures 0.2cm length x 0.3cm width x 0.1cm depth; 0.047cm^2 area and 0.005cm^3 volume. There is no tunneling or undermining noted. There is a medium amount of serous drainage noted. The wound margin is distinct with the outline attached to the wound base. There is no granulation within the wound bed. There is a large (67-100%) amount of necrotic tissue within the wound bed including Adherent Slough. Periwound temperature was noted as No Abnormality. The periwound has tenderness on palpation. Wound #5 status is Open. Original cause of wound was Gradually  Appeared. The wound is located on the Right,Dorsal Toe Second. The wound measures 0.9cm length x 1.2cm width x 0.1cm depth; 0.848cm^2 area and 0.085cm^3 volume. There is no tunneling or undermining noted. There is a large amount of serous drainage noted. The wound margin is distinct with the outline attached to the wound base. There is no granulation within the wound bed. There is a large (67-100%) amount of necrotic tissue within the wound bed including Adherent Slough. Periwound temperature was noted as No Abnormality. The periwound has tenderness on palpation. Assessment Active Problems ICD-10 L89.322 - Pressure ulcer of left buttock, stage 2 L97.222 - Non-pressure chronic ulcer of left calf with fat layer exposed L97.522 - Non-pressure chronic ulcer of other part of left foot with fat layer exposed I73.9 - Peripheral vascular disease, unspecified Plan Wound Cleansing: Wound #1 Left,Dorsal Foot: Clean wound with Normal Saline. Cleanse wound with mild soap and water Wound #4 Left Gluteus: Clean wound with Normal Saline. Cleanse wound with mild soap and water Wound #5  Right,Dorsal Toe SecondCHETARA, [name] [name] ([id number]) Clean wound with Normal Saline. Cleanse wound with mild soap and water Anesthetic: Wound #1 Left,Dorsal Foot: Topical Lidocaine 4% cream applied to wound bed prior to debridement - for clinic use Wound #5 Right,Dorsal Toe Second: Topical Lidocaine 4% cream applied to wound bed prior to debridement - for clinic use Wound #4 Left Gluteus: Topical Lidocaine 4% cream applied to wound bed prior to debridement - for clinic use Skin Barriers/Peri-Wound Care: Wound #4 Left Gluteus: Skin Prep Primary Wound Dressing: Wound #1 Left,Dorsal Foot: Santyl Ointment Wound #4 Left Gluteus: Aquacel Ag - or equivalent to Wound #5 Right,Dorsal Toe Second: Santyl Ointment Secondary Dressing: Wound #1 Left,Dorsal Foot: ABD pad Dry Gauze Gauze and Kerlix/Conform Wound #4 Left Gluteus: Boardered Foam Dressing Wound #5 Right,Dorsal Toe Second: Dry Gauze Conform/Kerlix Dressing Change Frequency: Wound #1 Left,Dorsal Foot: Change dressing every other day. Wound #4 Left Gluteus: Change dressing every other day. Wound #5 Right,Dorsal Toe Second: Change dressing every other day. Follow-up Appointments: Wound #1 Left,Dorsal Foot: Return Appointment in 1 week. Wound #4 Left Gluteus: Return Appointment in 1 week. Wound #5 Right,Dorsal Toe Second: Return Appointment in 1 week. Edema Control: Wound #1 Left,Dorsal Foot: Elevate legs to the level of the heart and pump ankles as often as possible Wound #5 Right,Dorsal Toe Second: Elevate legs to the level of the heart and pump ankles as often as possible Off-Loading: Wound #1 Left,Dorsal Foot: Turn and reposition every 2 hours THANDIWE, SIRAGUSA. ([id number]) Wound #4 Left Gluteus: Turn and reposition every 2 hours Wound #5 Right,Dorsal Toe Second: Turn and reposition every 2 hours Additional Orders / Instructions: Wound #1 Left,Dorsal Foot: Increase protein intake. Wound #4 Left  Gluteus: Increase protein intake. Wound #5 Right,Dorsal Toe Second: Increase protein intake. Home Health: Wound #1 Left,Dorsal Foot: [place] Nurse may visit PRN to address patient s wound care needs. FACE TO FACE ENCOUNTER: MEDICARE and MEDICAID PATIENTS: I certify that this patient is under my care and that I had a face-to-face encounter that meets the physician face-to-face encounter requirements with this patient on this date. The encounter with the patient was in whole or in part for the following MEDICAL CONDITION: (primary reason for [place]) MEDICAL NECESSITY: I certify, that based on my findings, NURSING services are a medically necessary home health service. HOME BOUND STATUS: I certify that my clinical findings support that this patient is homebound (i.e., Due  to illness or injury, pt requires aid of supportive devices such as crutches, cane, wheelchairs, walkers, the use of special transportation or the assistance of another person to leave their place of residence. There is a normal inability to leave the home and doing so requires considerable and taxing effort. Other absences are for medical reasons / religious services and are infrequent or of short duration when for other reasons). If current dressing causes regression in wound condition, may D/C ordered dressing product/s and apply Normal Saline Moist Dressing daily until next [place] / Other MD appointment. [place] of regression in wound condition at [phone]. Please direct any NON-WOUND related issues/requests for orders to patient's Primary Care Physician Wound #4 Left Gluteus: [place] Nurse may visit PRN to address patient s wound care needs. FACE TO FACE ENCOUNTER: MEDICARE and MEDICAID PATIENTS: I certify that this patient is under my care and that I had a face-to-face encounter that  meets the physician face-to-face encounter requirements with this patient on this date. The encounter with the patient was in whole or in part for the following MEDICAL CONDITION: (primary reason for [place]) MEDICAL NECESSITY: I certify, that based on my findings, NURSING services are a medically necessary home health service. HOME BOUND STATUS: I certify that my clinical findings support that this patient is homebound (i.e., Due to illness or injury, pt requires aid of supportive devices such as crutches, cane, wheelchairs, walkers, the use of special transportation or the assistance of another person to leave their place of residence. There is a normal inability to leave the home and doing so requires considerable and taxing effort. Other absences are for medical reasons / religious services and are infrequent or of short duration when for other reasons). If current dressing causes regression in wound condition, may D/C ordered dressing product/s and apply Normal Saline Moist Dressing daily until next [place] / Other MD appointment. [place] of regression in wound condition at [phone]. Please direct any NON-WOUND related issues/requests for orders to patient's Primary Care Physician Wound #5 Right,Dorsal Toe Second: [place] Nurse may visit PRN to address patient s wound care needs. FACE TO FACE ENCOUNTER: MEDICARE and MEDICAID PATIENTS: I certify that this patient is under my care and that I had a face-to-face encounter that meets the physician face-to-face encounter BRENEE, GAJDA ([id number]) requirements with this patient on this date. The encounter with the patient was in whole or in part for the following MEDICAL CONDITION: (primary reason for [place]) MEDICAL NECESSITY: I certify, that based on my findings, NURSING services are a medically necessary home health service. HOME BOUND STATUS:  I certify that my clinical findings support that this patient is homebound (i.e., Due to illness or injury, pt requires aid of supportive devices such as crutches, cane, wheelchairs, walkers, the use of special transportation or the assistance of another person to leave their place of residence. There is a normal inability to leave the home and doing so requires considerable and taxing effort. Other absences are for medical reasons / religious services and are infrequent or of short duration when for other reasons). If current dressing causes regression in wound condition, may D/C ordered dressing product/s and apply Normal Saline Moist Dressing daily until next [place] / Other MD appointment. Rule of regression in wound condition at [phone]. Please direct any NON-WOUND related  issues/requests for orders to patient's Primary Care Physician Medications-please add to medication list.: Wound #1 Left,Dorsal Foot: Santyl Enzymatic Ointment Other: - Vitamin C, Zinc, MVI Wound #4 Left Gluteus: Other: - Vitamin C, Zinc, MVI Wound #5 Right,Dorsal Toe Second: Santyl Enzymatic Ointment Other: - Vitamin C, Zinc, MVI the patient's daughter is at the bedside and the emphasis is more on comfort care. After review I have recommended: 1. Silver alginate and a bordered foam to the sacral and left gluteal area 2. Silver alginate to the left lower extremity and Santyl ointment to the dorsum of the left foot. A light Kerlix and Coban dressing to be applied 3. Santyl ointment to the right second toe 4. Elevation and exercise 5. Offloading has been discussed in great detail 6. Adequate protein, vitamin A, vitamin C and zinc 7. Regular visits to the wound center Electronic Signature(s) Signed: 03/03/2017 7:59:12 AM By: Christin Fudge MD, FACS Previous Signature: 03/01/2017 4:47:52 PM Version By: Christin Fudge MD, FACS Entered By: Christin Fudge on 03/03/2017  07:59:11 [name] [name] ([id number]) -------------------------------------------------------------------------------- SuperBill Details Patient Name: [name] [name] Date of Service: 03/01/2017 Medical Record Number: [id number] Patient Account Number: [id number] Date of Birth/Sex: 1923-08-23 (83 y.o. 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 I73.9 Peripheral vascular disease, unspecified Facility Procedures CPT4 Code: [id number] Description: 76720 - WOUND CARE VISIT-LEV 5 EST PT Modifier: Quantity: 1 Physician Procedures CPT4 Code Description: [id number] 83662 - WC PHYS LEVEL 3 - EST PT ICD-10 Description Diagnosis L89.322 Pressure ulcer of left buttock, stage 2 L97.222 Non-pressure chronic ulcer of left calf with fat lay L97.522 Non-pressure chronic ulcer of other part of  left foo I73.9 Peripheral vascular disease, unspecified Modifier: er exposed t with fat laye Quantity: 1 r exposed Electronic Signature(s) Signed: 03/01/2017 5:15:20 PM By: Alric Quan Signed: 03/03/2017 7:58:29 AM By: Christin Fudge MD, FACS Previous Signature: 03/01/2017 4:48:06 PM Version By: Christin Fudge MD, FACS Entered By: Alric Quan on 03/01/2017 17:03:42

## 2017-03-08 ENCOUNTER — Encounter: Payer: Medicare Other | Admitting: Surgery

## 2017-03-08 DIAGNOSIS — I509 Heart failure, unspecified: Secondary | ICD-10-CM | POA: Diagnosis not present

## 2017-03-08 DIAGNOSIS — I739 Peripheral vascular disease, unspecified: Secondary | ICD-10-CM | POA: Diagnosis not present

## 2017-03-08 DIAGNOSIS — L97222 Non-pressure chronic ulcer of left calf with fat layer exposed: Secondary | ICD-10-CM | POA: Diagnosis not present

## 2017-03-08 DIAGNOSIS — L97512 Non-pressure chronic ulcer of other part of right foot with fat layer exposed: Secondary | ICD-10-CM | POA: Diagnosis not present

## 2017-03-08 DIAGNOSIS — L97522 Non-pressure chronic ulcer of other part of left foot with fat layer exposed: Secondary | ICD-10-CM | POA: Diagnosis not present

## 2017-03-08 DIAGNOSIS — L89322 Pressure ulcer of left buttock, stage 2: Secondary | ICD-10-CM | POA: Diagnosis not present

## 2017-03-08 DIAGNOSIS — Z885 Allergy status to narcotic agent status: Secondary | ICD-10-CM | POA: Diagnosis not present

## 2017-03-08 NOTE — Progress Notes (Addendum)
DEETTE, REVAK ([id number]) Visit Report for 03/08/2017 Chief Complaint Document Details Patient Name: [name] [name], [name] [name]. Date of Service: 03/08/2017 3:30 PM Medical Record Number: [id number] Patient Account Number: [id number] Date of Birth/Sex: July 22, 1923 (83 y.o. Female) Treating RN: Montey Hora Primary Care Provider: Celedonio Miyamoto Other Clinician: Referring Provider: Celedonio Miyamoto Treating Provider/Extender: Frann Rider in Treatment: 3 Information Obtained from: Patient Chief Complaint Patient is at the clinic for treatment of an open pressure ulcer to her left gluteal area and other wounds on her left lower leg and foot, for 3 weeks now Electronic Signature(s) Signed: 03/08/2017 4:20:46 PM By: Christin Fudge MD, FACS Previous Signature: 03/08/2017 4:10:05 PM Version By: Christin Fudge MD, FACS Entered By: Christin Fudge on 03/08/2017 16:20:46 [name] [name] ([id number]) -------------------------------------------------------------------------------- HPI Details Patient Name: [name] [name] Date of Service: 03/08/2017 3:30 PM Medical Record Number: [id number] Patient Account Number: [id number] Date of Birth/Sex: Mar 15, 1923 (83 y.o. Female) Treating RN: Montey Hora Primary Care Provider: Celedonio Miyamoto Other Clinician: Referring Provider: Celedonio Miyamoto Treating Provider/Extender: Frann Rider in Treatment: 3 History of Present Illness Location: left gluteal area, left foot, left lower extremity Quality: Patient reports experiencing a sharp pain to affected area(s). Severity: Patient states wound are getting worse. Duration: Patient has had the wound for < 3 weeks prior to presenting for treatment Timing: Pain in wound is Intermittent (comes and goes Context: The wound appeared gradually over time Modifying Factors: Other treatment(s) tried include:local care with Unna boots Associated Signs and Symptoms: Patient reports having increase swelling. HPI Description:  83 year old patient referred to as by her nurse sent [facility] for ulcers both legs,decubitus ulcer on the sacrum which she's had for about 3 weeks. The daughter is at the bedside does not know how these occurred and there was no fall or trauma Her past medical history is suggestive of chronic diastolic heart failure, hypertension, peripheral arterial disease, chronic venous insufficiency, generalized anxiety disorder, and has never been a smoker. She is also status post appendectomy, cholecystectomy, hysterectomy and left hip replacement. She is also had a fracture of her left humerus in November 2017 and has been treated nonsurgically with a Sarmiento brace. 03/01/2017 -- since she was seen here the last time she has been admitted to the hospital between May 3 and 02/21/2017 with COPD exacerbation and pressure injuries to the skin. she was treated with doxycycline as she had an MRSA positive sputum but had no pneumonia. She was also diuresed with IV Lasix and this helped his CHF. 03/08/2017 -- she has improved markedly and her general health and overall is looking more positive Electronic Signature(s) Signed: 03/08/2017 4:21:04 PM By: Christin Fudge MD, FACS Entered By: Christin Fudge on 03/08/2017 16:21:04 [name] [name] ([id number]) -------------------------------------------------------------------------------- Physical Exam Details Patient Name: [name] [name] Date of Service: 03/08/2017 3:30 PM Medical Record Number: [id number] Patient Account Number: [id number] Date of Birth/Sex: [birth year] (83 y.o. Female) Treating RN: Montey Hora Primary Care Provider: Celedonio Miyamoto Other Clinician: Referring Provider: Celedonio Miyamoto Treating Provider/Extender: Frann Rider in Treatment: 3 Constitutional . Pulse regular. Respirations normal and unlabored. Afebrile. . Eyes Nonicteric. Reactive to light. Ears, Nose, Mouth, and Throat Lips, teeth, and gums WNL.Marland Kitchen Moist  mucosa without lesions. Neck supple and nontender. No palpable supraclavicular or cervical adenopathy. Normal sized without goiter. Respiratory WNL. No retractions.. Breath sounds WNL, No rubs, rales, rhonchi, or wheeze.. Cardiovascular Heart rhythm and rate regular, no murmur or gallop.. Pedal Pulses WNL. No clubbing, cyanosis or edema. Chest Breasts  symmetical and no nipple discharge.. Breast tissue WNL, no masses, lumps, or tenderness.. Lymphatic No adneopathy. No adenopathy. No adenopathy. Musculoskeletal Adexa without tenderness or enlargement.. Digits and nails w/o clubbing, cyanosis, infection, petechiae, ischemia, or inflammatory conditions.. Integumentary (Hair, Skin) No suspicious lesions. No crepitus or fluctuance. No peri-wound warmth or erythema. No masses.Marland Kitchen Psychiatric Judgement and insight Intact.. No evidence of depression, anxiety, or agitation.. Notes no sharp debridement was required today and the stage II pressure ulcer on her gluteal area has almost completely healed and the sacral wound is minimally open. The wounds on the dorsum of her left foot and on the right second toe needed to continue with Santyl ointment. Electronic Signature(s) Signed: 03/08/2017 4:21:45 PM By: Christin Fudge MD, FACS Entered By: Christin Fudge on 03/08/2017 16:21:45 [name] [name] ([id number]) -------------------------------------------------------------------------------- Physician Orders Details Patient Name: [name] [name], [name] [name] Date of Service: 03/08/2017 3:30 PM Medical Record Number: [id number] Patient Account Number: [id number] Date of Birth/Sex: [birth year] (83 y.o. Female) Treating RN: Montey Hora Primary Care Provider: Celedonio Miyamoto Other Clinician: Referring Provider: Celedonio Miyamoto Treating Provider/Extender: Frann Rider in Treatment: 3 Verbal / Phone Orders: No Diagnosis Coding ICD-10 Coding Code Description [phone] Pressure ulcer of left buttock, stage 2 L97.222  Non-pressure chronic ulcer of left calf with fat layer exposed L97.522 Non-pressure chronic ulcer of other part of left foot with fat layer exposed I73.9 Peripheral vascular disease, unspecified Wound Cleansing Wound #1 Left,Dorsal Foot o Clean wound with Normal Saline. o Cleanse wound with mild soap and water Wound #4 Left Gluteus o Clean wound with Normal Saline. o Cleanse wound with mild soap and water Wound #5 Right,Dorsal Toe Second o Clean wound with Normal Saline. o Cleanse wound with mild soap and water Anesthetic Wound #1 Left,Dorsal Foot o Topical Lidocaine 4% cream applied to wound bed prior to debridement - for clinic use Wound #4 Left Gluteus o Topical Lidocaine 4% cream applied to wound bed prior to debridement - for clinic use Wound #5 Right,Dorsal Toe Second o Topical Lidocaine 4% cream applied to wound bed prior to debridement - for clinic use Skin Barriers/Peri-Wound Care Wound #4 Left Gluteus o Skin Prep Primary Wound Dressing Wound #1 Left,Dorsal Foot TUJUANA, KILMARTIN ([id number]) o Santyl Ointment Wound #4 Left Gluteus o Aquacel Ag - or equivalent to Wound #5 Right,Dorsal Toe Second o Santyl Ointment Secondary Dressing Wound #1 Left,Dorsal Foot o ABD pad o Dry Gauze o Gauze and Kerlix/Conform Wound #4 Left Gluteus o Boardered Foam Dressing Wound #5 Right,Dorsal Toe Second o Dry Gauze o Conform/Kerlix Dressing Change Frequency Wound #1 Left,Dorsal Foot o Change dressing every other day. Wound #4 Left Gluteus o Change dressing every other day. Wound #5 Right,Dorsal Toe Second o Change dressing every other day. Follow-up Appointments Wound #1 Left,Dorsal Foot o Return Appointment in 1 week. Wound #4 Left Gluteus o Return Appointment in 1 week. Wound #5 Right,Dorsal Toe Second o Return Appointment in 1 week. Edema Control Wound #1 Left,Dorsal Foot o Elevate legs to the level of the heart and  pump ankles as often as possible Wound #5 Right,Dorsal Toe Second o Elevate legs to the level of the heart and pump ankles as often as possible [name] [name], [name] O. ([id number]) Off-Loading Wound #1 Left,Dorsal Foot o Turn and reposition every 2 hours Wound #4 Left Gluteus o Turn and reposition every 2 hours Wound #5 Right,Dorsal Toe Second o Turn and reposition every 2 hours Additional Orders / Instructions Wound #1 Left,Dorsal Foot o Increase protein intake. Wound #  4 Left Gluteus o Increase protein intake. Wound #5 Right,Dorsal Toe Second o Increase protein intake. Home Health Wound #1 [facility] Vis[facility] - [facility] o Home Health Nurse may visit PRN to address patientos wound care needs. o FACE TO FACE ENCOUNTER: MEDICARE and MEDICAID PATIENTS: I certify that this patient is under my care and that I had a face-to-face encounter that meets the physician face-to-face encounter requirements with this patient on this date. The encounter with the patient was in whole or in part for the following MEDICAL CONDITION: (primary reason for [place]) MEDICAL NECESSITY: I certify, that based on my findings, NURSING services are a medically necessary home health service. HOME BOUND STATUS: I certify that my clinical findings support that this patient is homebound (i.e., Due to illness or injury, pt requires aid of supportive devices such as crutches, cane, wheelchairs, walkers, the use of special transportation or the assistance of another person to leave their place of residence. There is a normal inability to leave the home and doing so requires considerable and taxing effort. Other absences are for medical reasons / religious services and are infrequent or of short duration when for other reasons). o If current dressing causes regression in wound condition, may D/C ordered dressing product/s and apply Normal Saline Moist Dressing daily  until next [place] / Other MD appointment. [place] of regression in wound condition at [phone]. o Please direct any NON-WOUND related issues/requests for orders to patient's Primary Care Physician Wound #4 Left [place] Nurse may visit PRN to address patientos wound care needs. o FACE TO FACE ENCOUNTER: MEDICARE and MEDICAID PATIENTS: I certify that this patient is under my care and that I had a face-to-face encounter that meets the physician face-to-face encounter requirements with this patient on this date. The encounter with the patient was in [place]. ([id number]) whole or in part for the following MEDICAL CONDITION: (primary reason for [place]) MEDICAL NECESSITY: I certify, that based on my findings, NURSING services are a medically necessary home health service. HOME BOUND STATUS: I certify that my clinical findings support that this patient is homebound (i.e., Due to illness or injury, pt requires aid of supportive devices such as crutches, cane, wheelchairs, walkers, the use of special transportation or the assistance of another person to leave their place of residence. There is a normal inability to leave the home and doing so requires considerable and taxing effort. Other absences are for medical reasons / religious services and are infrequent or of short duration when for other reasons). o If current dressing causes regression in wound condition, may D/C ordered dressing product/s and apply Normal Saline Moist Dressing daily until next [place] / Other MD appointment. [place] of regression in wound condition at [phone]. o Please direct any NON-WOUND related issues/requests for orders to patient's Primary Care Physician Wound #5 Right,Dorsal Toe [place] Nurse may  visit PRN to address patientos wound care needs. o FACE TO FACE ENCOUNTER: MEDICARE and MEDICAID PATIENTS: I certify that this patient is under my care and that I had a face-to-face encounter that meets the physician face-to-face encounter requirements with this patient on this date. The encounter with the patient was in whole or in part for the following MEDICAL CONDITION: (primary reason for [place]) MEDICAL NECESSITY: I certify, that based  on my findings, NURSING services are a medically necessary home health service. HOME BOUND STATUS: I certify that my clinical findings support that this patient is homebound (i.e., Due to illness or injury, pt requires aid of supportive devices such as crutches, cane, wheelchairs, walkers, the use of special transportation or the assistance of another person to leave their place of residence. There is a normal inability to leave the home and doing so requires considerable and taxing effort. Other absences are for medical reasons / religious services and are infrequent or of short duration when for other reasons). o If current dressing causes regression in wound condition, may D/C ordered dressing product/s and apply Normal Saline Moist Dressing daily until next [place] / Other MD appointment. [place] of regression in wound condition at [phone]. o Please direct any NON-WOUND related issues/requests for orders to patient's Primary Care Physician Medications-please add to medication list. Wound #1 Left,Dorsal Foot o Santyl Enzymatic Ointment o Other: - Vitamin C, Zinc, MVI Wound #4 Left Gluteus o Other: - Vitamin C, Zinc, MVI Wound #5 Right,Dorsal Toe Second o Santyl Enzymatic Ointment o Other: - Vitamin C, Zinc, MVI [name] [name], [name] [name] ([id number]) Electronic Signature(s) Signed: 03/08/2017 4:23:30 PM By: Christin Fudge MD, FACS Signed: 03/08/2017 4:49:11 PM By: Montey Hora Entered By:  Montey Hora on 03/08/2017 16:16:11 [name] [name] ([id number]) -------------------------------------------------------------------------------- Problem List Details Patient Name: [name] [name], [name] [name]. Date of Service: 03/08/2017 3:30 PM Medical Record Number: [id number] Patient Account Number: [id number] Date of Birth/Sex: 1923-01-10 (83 y.o. Female) Treating RN: Montey Hora Primary Care Provider: Celedonio Miyamoto Other Clinician: Referring Provider: Celedonio Miyamoto Treating Provider/Extender: Frann Rider in Treatment: 3 Active Problems ICD-10 Encounter Code Description Active Date Diagnosis [phone] Pressure ulcer of left buttock, stage 2 02/11/2017 Yes L97.222 Non-pressure chronic ulcer of left calf with fat layer 02/11/2017 Yes exposed L97.522 Non-pressure chronic ulcer of other part of left foot with fat 02/11/2017 Yes layer exposed I73.9 Peripheral vascular disease, unspecified 02/11/2017 Yes Inactive Problems Resolved Problems Electronic Signature(s) Signed: 03/08/2017 4:19:10 PM By: Christin Fudge MD, FACS Previous Signature: 03/08/2017 4:09:55 PM Version By: Christin Fudge MD, FACS Entered By: Christin Fudge on 03/08/2017 16:19:10 [name] [name] ([id number]) -------------------------------------------------------------------------------- Progress Note Details Patient Name: [name] [name] Date of Service: 03/08/2017 3:30 PM Medical Record Number: [id number] Patient Account Number: [id number] Date of Birth/Sex: Sep 25, 1923 (83 y.o. Female) Treating RN: Montey Hora Primary Care Provider: Celedonio Miyamoto Other Clinician: Referring Provider: Celedonio Miyamoto Treating Provider/Extender: Frann Rider in Treatment: 3 Subjective Chief Complaint Information obtained from Patient Patient is at the clinic for treatment of an open pressure ulcer to her left gluteal area and other wounds on her left lower leg and foot, for 3 weeks now History of Present Illness (HPI) The following  HPI elements were documented for the patient's wound: Location: left gluteal area, left foot, left lower extremity Quality: Patient reports experiencing a sharp pain to affected area(s). Severity: Patient states wound are getting worse. Duration: Patient has had the wound for < 3 weeks prior to presenting for treatment Timing: Pain in wound is Intermittent (comes and goes Context: The wound appeared gradually over time Modifying Factors: Other treatment(s) tried include:local care with Unna boots Associated Signs and Symptoms: Patient reports having increase swelling. 83 year old patient referred to as by her nurse sent [facility] for ulcers both legs,decubitus ulcer on the sacrum which she's had for about 3 weeks. The daughter is at the bedside does not know how  these occurred and there was no fall or trauma Her past medical history is suggestive of chronic diastolic heart failure, hypertension, peripheral arterial disease, chronic venous insufficiency, generalized anxiety disorder, and has never been a smoker. She is also status post appendectomy, cholecystectomy, hysterectomy and left hip replacement. She is also had a fracture of her left humerus in November 2017 and has been treated nonsurgically with a Sarmiento brace. 03/01/2017 -- since she was seen here the last time she has been admitted to the hospital between May 3 and 02/21/2017 with COPD exacerbation and pressure injuries to the skin. she was treated with doxycycline as she had an MRSA positive sputum but had no pneumonia. She was also diuresed with IV Lasix and this helped his CHF. 03/08/2017 -- she has improved markedly and her general health and overall is looking more positive Objective Constitutional [name] [name], [name] [name] ([id number]) Pulse regular. Respirations normal and unlabored. Afebrile. Vitals Time Taken: 3:52 PM, Height: 66 in, Weight: 144 lbs, BMI: 23.2, Temperature: 98.3 F, Pulse: 70 bpm, Respiratory  Rate: 16 breaths/min, Blood Pressure: 183/61 mmHg. Eyes Nonicteric. Reactive to light. Ears, Nose, Mouth, and Throat Lips, teeth, and gums WNL.Marland Kitchen Moist mucosa without lesions. Neck supple and nontender. No palpable supraclavicular or cervical adenopathy. Normal sized without goiter. Respiratory WNL. No retractions.. Breath sounds WNL, No rubs, rales, rhonchi, or wheeze.. Cardiovascular Heart rhythm and rate regular, no murmur or gallop.. Pedal Pulses WNL. No clubbing, cyanosis or edema. Chest Breasts symmetical and no nipple discharge.. Breast tissue WNL, no masses, lumps, or tenderness.. Lymphatic No adneopathy. No adenopathy. No adenopathy. Musculoskeletal Adexa without tenderness or enlargement.. Digits and nails w/o clubbing, cyanosis, infection, petechiae, ischemia, or inflammatory conditions.Marland Kitchen Psychiatric Judgement and insight Intact.. No evidence of depression, anxiety, or agitation.. General Notes: no sharp debridement was required today and the stage II pressure ulcer on her gluteal area has almost completely healed and the sacral wound is minimally open. The wounds on the dorsum of her left foot and on the right second toe needed to continue with Santyl ointment. Integumentary (Hair, Skin) No suspicious lesions. No crepitus or fluctuance. No peri-wound warmth or erythema. No masses.. Wound #1 status is Open. Original cause of wound was Gradually Appeared. The wound is located on the Left,Dorsal Foot. The wound measures 1.7cm length x 1.6cm width x 0.1cm depth; 2.136cm^2 area and 0.214cm^3 volume. There is no tunneling or undermining noted. There is a large amount of serosanguineous drainage noted. The wound margin is distinct with the outline attached to the wound base. There is small (1-33%) red granulation within the wound bed. There is a large (67-100%) amount of necrotic tissue within the wound bed including Adherent Slough. The periwound skin appearance  exhibited: Maceration. Periwound temperature was noted as No Abnormality. The periwound has tenderness on palpation. [name] [name], [name] [name] ([id number]) Wound #4 status is Open. Original cause of wound was Pressure Injury. The wound is located on the Left Gluteus. The wound measures 0.2cm length x 0.2cm width x 0.1cm depth; 0.031cm^2 area and 0.003cm^3 volume. There is no tunneling or undermining noted. There is a medium amount of serous drainage noted. The wound margin is distinct with the outline attached to the wound base. There is large (67-100%) pink granulation within the wound bed. There is no necrotic tissue within the wound bed. Periwound temperature was noted as No Abnormality. The periwound has tenderness on palpation. Wound #5 status is Open. Original cause of wound was Gradually Appeared. The wound is located on the Right,Dorsal  Toe Second. The wound measures 1cm length x 1cm width x 0.1cm depth; 0.785cm^2 area and 0.079cm^3 volume. There is no tunneling or undermining noted. There is a large amount of serous drainage noted. The wound margin is distinct with the outline attached to the wound base. There is small (1-33%) red granulation within the wound bed. There is a large (67-100%) amount of necrotic tissue within the wound bed including Adherent Slough. Periwound temperature was noted as No Abnormality. The periwound has tenderness on palpation. Assessment Active Problems ICD-10 L89.322 - Pressure ulcer of left buttock, stage 2 L97.222 - Non-pressure chronic ulcer of left calf with fat layer exposed L97.522 - Non-pressure chronic ulcer of other part of left foot with fat layer exposed I73.9 - Peripheral vascular disease, unspecified Plan Wound Cleansing: Wound #1 Left,Dorsal Foot: Clean wound with Normal Saline. Cleanse wound with mild soap and water Wound #4 Left Gluteus: Clean wound with Normal Saline. Cleanse wound with mild soap and water Wound #5 Right,Dorsal Toe  Second: Clean wound with Normal Saline. Cleanse wound with mild soap and water Anesthetic: Wound #1 Left,Dorsal Foot: Topical Lidocaine 4% cream applied to wound bed prior to debridement - for clinic use Wound #4 Left Gluteus: Topical Lidocaine 4% cream applied to wound bed prior to debridement - for clinic use [name] [name], [name] [name] ([id number]) Wound #5 Right,Dorsal Toe Second: Topical Lidocaine 4% cream applied to wound bed prior to debridement - for clinic use Skin Barriers/Peri-Wound Care: Wound #4 Left Gluteus: Skin Prep Primary Wound Dressing: Wound #1 Left,Dorsal Foot: Santyl Ointment Wound #4 Left Gluteus: Aquacel Ag - or equivalent to Wound #5 Right,Dorsal Toe Second: Santyl Ointment Secondary Dressing: Wound #1 Left,Dorsal Foot: ABD pad Dry Gauze Gauze and Kerlix/Conform Wound #4 Left Gluteus: Boardered Foam Dressing Wound #5 Right,Dorsal Toe Second: Dry Gauze Conform/Kerlix Dressing Change Frequency: Wound #1 Left,Dorsal Foot: Change dressing every other day. Wound #4 Left Gluteus: Change dressing every other day. Wound #5 Right,Dorsal Toe Second: Change dressing every other day. Follow-up Appointments: Wound #1 Left,Dorsal Foot: Return Appointment in 1 week. Wound #4 Left Gluteus: Return Appointment in 1 week. Wound #5 Right,Dorsal Toe Second: Return Appointment in 1 week. Edema Control: Wound #1 Left,Dorsal Foot: Elevate legs to the level of the heart and pump ankles as often as possible Wound #5 Right,Dorsal Toe Second: Elevate legs to the level of the heart and pump ankles as often as possible Off-Loading: Wound #1 Left,Dorsal Foot: Turn and reposition every 2 hours Wound #4 Left Gluteus: Turn and reposition every 2 hours Wound #5 Right,Dorsal Toe Second: Turn and reposition every 2 hours Additional Orders / Instructions: Wound #1 Left,Dorsal Foot: Increase protein intake. [name] [name], [name] [name] ([id number]) Wound #4 Left Gluteus: Increase protein  intake. Wound #5 Right,Dorsal Toe Second: Increase protein intake. Home Health: Wound #1 Left,Dorsal Foot: [place] Nurse may visit PRN to address patient s wound care needs. FACE TO FACE ENCOUNTER: MEDICARE and MEDICAID PATIENTS: I certify that this patient is under my care and that I had a face-to-face encounter that meets the physician face-to-face encounter requirements with this patient on this date. The encounter with the patient was in whole or in part for the following MEDICAL CONDITION: (primary reason for [place]) MEDICAL NECESSITY: I certify, that based on my findings, NURSING services are a medically necessary home health service. HOME BOUND STATUS: I certify that my clinical findings support that this patient is homebound (i.e., Due to illness or injury, pt requires  aid of supportive devices such as crutches, cane, wheelchairs, walkers, the use of special transportation or the assistance of another person to leave their place of residence. There is a normal inability to leave the home and doing so requires considerable and taxing effort. Other absences are for medical reasons / religious services and are infrequent or of short duration when for other reasons). If current dressing causes regression in wound condition, may D/C ordered dressing product/s and apply Normal Saline Moist Dressing daily until next [place] / Other MD appointment. [place] of regression in wound condition at [phone]. Please direct any NON-WOUND related issues/requests for orders to patient's Primary Care Physician Wound #4 Left Gluteus: [place] Nurse may visit PRN to address patient s wound care needs. FACE TO FACE ENCOUNTER: MEDICARE and MEDICAID PATIENTS: I certify that this patient is under my care and that I had a face-to-face encounter that meets the physician  face-to-face encounter requirements with this patient on this date. The encounter with the patient was in whole or in part for the following MEDICAL CONDITION: (primary reason for [place]) MEDICAL NECESSITY: I certify, that based on my findings, NURSING services are a medically necessary home health service. HOME BOUND STATUS: I certify that my clinical findings support that this patient is homebound (i.e., Due to illness or injury, pt requires aid of supportive devices such as crutches, cane, wheelchairs, walkers, the use of special transportation or the assistance of another person to leave their place of residence. There is a normal inability to leave the home and doing so requires considerable and taxing effort. Other absences are for medical reasons / religious services and are infrequent or of short duration when for other reasons). If current dressing causes regression in wound condition, may D/C ordered dressing product/s and apply Normal Saline Moist Dressing daily until next [place] / Other MD appointment. [place] of regression in wound condition at [phone]. Please direct any NON-WOUND related issues/requests for orders to patient's Primary Care Physician Wound #5 Right,Dorsal Toe Second: [place] Nurse may visit PRN to address patient s wound care needs. FACE TO FACE ENCOUNTER: MEDICARE and MEDICAID PATIENTS: I certify that this patient is under my care and that I had a face-to-face encounter that meets the physician face-to-face encounter requirements with this patient on this date. The encounter with the patient was in whole or in part for the following MEDICAL CONDITION: (primary reason for [place]) MEDICAL NECESSITY: I certify, that based on my findings, NURSING services are a medically necessary home health service. HOME BOUND STATUS: I certify that my clinical findings support that  this patient is homebound (i.e., Due to illness or injury, pt requires aid of supportive devices such as crutches, cane, wheelchairs, walkers, the use of special transportation or the assistance of another person to leave their place of residence. There is a normal inability to leave the home and doing so requires considerable and taxing effort. Other absences are [name] [name], RENNIE ([id number]) for medical reasons / religious services and are infrequent or of short duration when for other reasons). If current dressing causes regression in wound condition, may D/C ordered dressing product/s and apply Normal Saline Moist Dressing daily until next [place] / Other MD appointment. [place] of regression in wound condition at [phone]. Please direct any NON-WOUND related issues/requests for orders to patient's Primary  Care Physician Medications-please add to medication list.: Wound #1 Left,Dorsal Foot: Santyl Enzymatic Ointment Other: - Vitamin C, Zinc, MVI Wound #4 Left Gluteus: Other: - Vitamin C, Zinc, MVI Wound #5 Right,Dorsal Toe Second: Santyl Enzymatic Ointment Other: - Vitamin C, Zinc, MVI the patient has improved a lot over the last week and is overall looking much better. After review I have recommended: 1. Silver alginate and a bordered foam to the sacral and left gluteal area 2. Silver alginate to the left lower extremity and Santyl ointment to the dorsum of the left foot. A light Kerlix and Coban dressing to be applied 3. Santyl ointment to the right second toe 4. supportive care, with adequate protein, vitamin E, vitamin C and zinc has also been emphasized Electronic Signature(s) Signed: 03/08/2017 4:22:53 PM By: Christin Fudge MD, FACS Entered By: Christin Fudge on 03/08/2017 16:22:53 [name] [name] ([id number]) -------------------------------------------------------------------------------- SuperBill Details Patient Name: [name] [name] Date of Service: 03/08/2017 Medical Record Number: [id number] Patient Account Number: [id number] Date of Birth/Sex: 04-Feb-1923 (83 y.o. 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 I73.9 Peripheral vascular disease, unspecified Facility Procedures CPT4 Code: [id number] Description: 99214 - WOUND CARE VISIT-LEV 4 EST PT Modifier: Quantity: 1 Physician Procedures CPT4 Code Description: [id number] - WC PHYS LEVEL 3 - EST PT ICD-10 Description Diagnosis L89.322 Pressure ulcer of left buttock, stage 2 L97.222 Non-pressure chronic ulcer of left calf with fat lay L97.522 Non-pressure chronic ulcer of other part of  left foo I73.9 Peripheral vascular disease, unspecified Modifier: er exposed t with fat laye Quantity: 1 r exposed Electronic Signature(s) Signed: 03/08/2017 4:38:18 PM By: Montey Hora Previous Signature: 03/08/2017 4:23:13 PM Version By: Christin Fudge MD, FACS Entered By: Montey Hora on 03/08/2017 16:38:17

## 2017-03-09 NOTE — Progress Notes (Signed)
[name] [name], [name] [name] ([id number]) Visit Report for 03/08/2017 Arrival Information Details Patient Name: [name] [name], [name] [name]. Date of Service: 03/08/2017 3:30 PM Medical Record Number: [id number] Patient Account Number: [id number] Date of Birth/Sex: [birth year] (83 y.o. Female) Treating RN: Montey Hora Primary Care Seichi Kaufhold: Celedonio Miyamoto Other Clinician: Referring Zayvon Alicea: Celedonio Miyamoto Treating Beverely Suen/Extender: Frann Rider in Treatment: 3 Visit Information History Since Last Visit All ordered tests and consults were completed: No Patient Arrived: Wheel Chair Added or deleted any medications: No Arrival Time: 15:52 Any new allergies or adverse reactions: No Accompanied By: daughter Had a fall or experienced change in No Transfer Assistance: EasyPivot Patient activities of daily living that may affect Lift risk of falls: Patient Identification Verified: Yes Signs or symptoms of abuse/neglect since last No Secondary Verification Process Yes visito Completed: Hospitalized since last visit: No Patient Requires Transmission- No Has Dressing in Place as Prescribed: Yes Based Precautions: Pain Present Now: No Patient Has Alerts: Yes Patient Alerts: Patient on Blood Thinner Plavix Electronic Signature(s) Signed: 03/08/2017 4:49:11 PM By: Montey Hora Entered By: Montey Hora on 03/08/2017 15:52:45 [name] [name] ([id number]) -------------------------------------------------------------------------------- Clinic Level of Care Assessment Details Patient Name: [name] [name] Date of Service: 03/08/2017 3:30 PM Medical Record Number: [id number] Patient Account Number: [id number] Date of Birth/Sex: 1923-02-14 (83 y.o. Female) Treating RN: Montey Hora Primary Care Perrin Gens: Celedonio Miyamoto Other Clinician: Referring Dewain Platz: Celedonio Miyamoto Treating Jayle Solarz/Extender: Frann Rider in Treatment: 3 Clinic Level of Care Assessment Items TOOL 4 Quantity Score []  - Use  when only an EandM is performed on FOLLOW-UP visit 0 ASSESSMENTS - Nursing Assessment / Reassessment X - Reassessment of Co-morbidities (includes updates in patient status) 1 10 X - Reassessment of Adherence to Treatment Plan 1 5 ASSESSMENTS - Wound and Skin Assessment / Reassessment []  - Simple Wound Assessment / Reassessment - one wound 0 X - Complex Wound Assessment / Reassessment - multiple wounds 3 5 []  - Dermatologic / Skin Assessment (not related to wound area) 0 ASSESSMENTS - Focused Assessment []  - Circumferential Edema Measurements - multi extremities 0 []  - Nutritional Assessment / Counseling / Intervention 0 X - Lower Extremity Assessment (monofilament, tuning fork, pulses) 1 5 []  - Peripheral Arterial Disease Assessment (using hand held doppler) 0 ASSESSMENTS - Ostomy and/or Continence Assessment and Care []  - Incontinence Assessment and Management 0 []  - Ostomy Care Assessment and Management (repouching, etc.) 0 PROCESS - Coordination of Care X - Simple Patient / Family Education for ongoing care 1 15 []  - Complex (extensive) Patient / Family Education for ongoing care 0 []  - Staff obtains Programmer, systems, Records, Test Results / Process Orders 0 []  - Staff telephones HHA, Nursing Homes / Clarify orders / etc 0 []  - Routine Transfer to another Facility (non-emergent condition) 0 [name] [name], KAGEL. ([id number]) []  - Routine Hospital Admission (non-emergent condition) 0 []  - New Admissions / Biomedical engineer / Ordering NPWT, Apligraf, etc. 0 []  - Emergency Hospital Admission (emergent condition) 0 X - Simple Discharge Coordination 1 10 []  - Complex (extensive) Discharge Coordination 0 PROCESS - Special Needs []  - Pediatric / Minor Patient Management 0 []  - Isolation Patient Management 0 []  - Hearing / Language / Visual special needs 0 []  - Assessment of Community assistance (transportation, D/C planning, etc.) 0 []  - Additional assistance / Altered mentation 0 []  - Support  Surface(s) Assessment (bed, cushion, seat, etc.) 0 INTERVENTIONS - Wound Cleansing / Measurement []  - Simple Wound Cleansing - one wound 0 X - Complex Wound Cleansing -  multiple wounds 3 5 X - Wound Imaging (photographs - any number of wounds) 1 5 []  - Wound Tracing (instead of photographs) 0 []  - Simple Wound Measurement - one wound 0 X - Complex Wound Measurement - multiple wounds 3 5 INTERVENTIONS - Wound Dressings X - Small Wound Dressing one or multiple wounds 3 10 []  - Medium Wound Dressing one or multiple wounds 0 []  - Large Wound Dressing one or multiple wounds 0 []  - Application of Medications - topical 0 []  - Application of Medications - injection 0 INTERVENTIONS - Miscellaneous []  - External ear exam 0 [name] [name], MESSMER. ([id number]) []  - Specimen Collection (cultures, biopsies, blood, body fluids, etc.) 0 []  - Specimen(s) / Culture(s) sent or taken to Lab for analysis 0 []  - Patient Transfer (multiple staff / Harrel Lemon Lift / Similar devices) 0 []  - Simple Staple / Suture removal (25 or less) 0 []  - Complex Staple / Suture removal (26 or more) 0 []  - Hypo / Hyperglycemic Management (close monitor of Blood Glucose) 0 []  - Ankle / Brachial Index (ABI) - do not check if billed separately 0 X - Vital Signs 1 5 Has the patient been seen at the hospital within the last three years: Yes Total Score: 130 Level Of Care: New/Established - Level 4 Electronic Signature(s) Signed: 03/08/2017 4:49:11 PM By: Montey Hora Entered By: Montey Hora on 03/08/2017 16:38:09 [name] [name] ([id number]) -------------------------------------------------------------------------------- Encounter Discharge Information Details Patient Name: [name] [name] Date of Service: 03/08/2017 3:30 PM Medical Record Number: [id number] Patient Account Number: [id number] Date of Birth/Sex: January 28, 1923 (83 y.o. Female) Treating RN: Montey Hora Primary Care Capria Cartaya: Celedonio Miyamoto Other Clinician: Referring  Kyira Volkert: Celedonio Miyamoto Treating Keely Drennan/Extender: Frann Rider in Treatment: 3 Encounter Discharge Information Items Discharge Pain Level: 0 Discharge Condition: Stable Ambulatory Status: Wheelchair Discharge Destination: Home Transportation: Private Auto Accompanied By: dtr Schedule Follow-up Appointment: Yes Medication Reconciliation completed and provided to Patient/Care No Loris Seelye: Provided on Clinical Summary of Care: 03/08/2017 Form Type Recipient Paper Patient DB Electronic Signature(s) Signed: 03/08/2017 4:39:19 PM By: Montey Hora Previous Signature: 03/08/2017 4:30:47 PM Version By: Ruthine Dose Entered By: Montey Hora on 03/08/2017 16:39:19 [name] [name] ([id number]) -------------------------------------------------------------------------------- Lower Extremity Assessment Details Patient Name: [name] [name] Date of Service: 03/08/2017 3:30 PM Medical Record Number: [id number] Patient Account Number: [id number] Date of Birth/Sex: 1923-09-07 (83 y.o. Female) Treating RN: Montey Hora Primary Care Aubriee Szeto: Celedonio Miyamoto Other Clinician: Referring Timothy Townsel: Celedonio Miyamoto Treating Zi Sek/Extender: Frann Rider in Treatment: 3 Vascular Assessment Pulses: Dorsalis Pedis Palpable: [Left:No] Doppler Audible: [Left:Yes] Posterior Tibial Extremity colors, hair growth, and conditions: Extremity Color: [Left:Mottled] Hair Growth on Extremity: [Left:No] Temperature of Extremity: [Left:Cool] Capillary Refill: [Left:> 3 seconds] Electronic Signature(s) Signed: 03/08/2017 4:49:11 PM By: Montey Hora Entered By: Montey Hora on 03/08/2017 16:13:59 [name] [name] ([id number]) -------------------------------------------------------------------------------- Multi Wound Chart Details Patient Name: [name] [name] Date of Service: 03/08/2017 3:30 PM Medical Record Number: [id number] Patient Account Number: [id number] Date of Birth/Sex: 02/13/1923  (83 y.o. 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] [5:To be determined]  Comorbid History: [1:Anemia, Congestive Heart Anemia, Congestive Heart Anemia, Congestive Heart Failure, Coronary Artery Failure, Coronary Artery Failure, Coronary Artery Disease, Hypertension, Peripheral Venous Disease, Neuropathy] [4:Disease,  Hypertension, Peripheral Venous Disease, Neuropathy] [5:Disease, Hypertension, Peripheral Venous Disease, Neuropathy] Date Acquired: [1:02/08/2017] [4:01/18/2017] [5:02/08/2017] Weeks of Treatment: [1:3] [4:3] [5:1] Wound Status: [1:Open] [4:Open] [5:Open] Measurements L x W x D 1.7x1.6x0.1 [4:0.2x0.2x0.1] [5:1x1x0.1] (cm) Area (cm) : [1:2.136] [4:0.031] [5:0.785] Volume (cm) : [1:0.214] [4:0.003] [5:0.079] % Reduction in Area: [1:-20.90%] [4:89.00%] [5:7.40%] % Reduction in Volume: -20.90% [4:89.30%] [5:7.10%] Classification: [1:Partial Thickness] [4:Category/Stage II] [5:Partial Thickness] Exudate Amount: [1:Large] [4:Medium] [5:Large] Exudate Type: [1:Serosanguineous] [4:Serous] [5:Serous] Exudate Color: [1:red, brown] [4:amber] [5:amber] Wound Margin: [1:Distinct, outline attached Distinct, outline attached Distinct, outline attached] Granulation Amount: [1:Small (1-33%)] [4:Large (67-100%)] [5:Small (1-33%)] Granulation Quality: [1:Red] [4:Pink] [5:Red] Necrotic Amount:  [1:Large (67-100%)] [4:None Present (0%)] [5:Large (67-100%)] Epithelialization: [1:None] [4:None] [5:None] Periwound Skin Texture: No Abnormalities Noted No Abnormalities Noted No Abnormalities Noted Periwound Skin [1:Maceration: Yes] [4:No Abnormalities Noted No Abnormalities Noted] Moisture: [name] [name], [name] [name] ([id number]) Periwound Skin Color: No Abnormalities Noted No Abnormalities Noted No Abnormalities Noted Temperature: No Abnormality No Abnormality No Abnormality Tenderness on Yes Yes Yes Palpation: Wound Preparation: Ulcer Cleansing: Ulcer Cleansing: Ulcer Cleansing: Rinsed/Irrigated with Rinsed/Irrigated with Rinsed/Irrigated with Saline Saline Saline Topical Anesthetic Topical Anesthetic Topical Anesthetic Applied: Other: lidocaine Applied: Other: lidocaine Applied: Other: lidocaine 4% 4% 4% Treatment Notes Electronic Signature(s) Signed: 03/08/2017 4:20:10 PM By: Christin Fudge MD, FACS Previous Signature: 03/08/2017 4:09:59 PM Version By: Christin Fudge MD, FACS Entered By: Christin Fudge on 03/08/2017 16:20:10 [name] [name] ([id number]) -------------------------------------------------------------------------------- [place] Details Patient Name: [name] [name], CALKIN. Date of Service: 03/08/2017 3:30 PM Medical Record Number: [id number] Patient Account Number: [id number] Date of Birth/Sex: [birth year] (83 y.o. Female) Treating RN: Montey Hora Primary Care Gera Inboden: Celedonio Miyamoto Other Clinician: Referring Renleigh Ouellet: Celedonio Miyamoto Treating Cordaro Mukai/Extender: Frann Rider in Treatment: 3 Active Inactive ` Abuse / Safety / Falls / Self Care Management Nursing Diagnoses: Potential for falls Goals: Patient will remain injury free Date Initiated: 02/11/2017 Target Resolution Date: 05/22/2017 Goal Status: Active Interventions: Assess fall risk on admission and as needed Assess self care needs on admission and as needed Notes: ` Nutrition Nursing  Diagnoses: Imbalanced nutrition Potential for alteratiion in Nutrition/Potential for imbalanced nutrition Goals: Patient/caregiver agrees to and verbalizes understanding of need to use nutritional supplements and/or vitamins as prescribed Date Initiated: 02/11/2017 Target Resolution Date: 05/22/2017 Goal Status: Active Interventions: Assess patient nutrition upon admission and as needed per policy Notes: ` Orientation to the [place], [place]. ([id number]) Nursing Diagnoses: Knowledge deficit related to the wound healing center program Goals: Patient/caregiver will verbalize understanding of the [place] Program Date Initiated: 02/11/2017 Target Resolution Date: 02/20/2017 Goal Status: Active Interventions: Provide education on orientation to the wound center Notes: ` Pain, Acute or Chronic Nursing Diagnoses: Pain, acute or chronic: actual or potential Potential alteration in comfort, pain Goals: Patient/caregiver will verbalize adequate pain control between visits Date Initiated: 02/11/2017 Target Resolution Date: 05/22/2017 Goal Status: Active Interventions: Assess comfort goal upon admission Complete pain assessment as per visit requirements Notes: ` Pressure Nursing Diagnoses: Knowledge deficit related to causes and risk factors for pressure ulcer development Knowledge deficit related to management of pressures ulcers Potential for impaired tissue integrity related to pressure, friction, moisture, and shear Goals: Patient/caregiver will verbalize risk factors for pressure ulcer development Date Initiated: 02/11/2017 Target Resolution Date: 05/22/2017 Goal Status: Active Interventions: Assess: immobility, friction, shearing,  incontinence upon admission and as needed [name] [name], [name] [name] ([id number]) Notes: ` Wound/Skin Impairment Nursing Diagnoses: Impaired tissue integrity Knowledge deficit related to ulceration/compromised skin  integrity Goals: Ulcer/skin breakdown will have a volume reduction of 80% by week 12 Date Initiated: 02/11/2017 Target Resolution Date: 05/22/2017 Goal Status: Active Interventions: Assess patient/caregiver ability to perform ulcer/skin care regimen upon admission and as needed Assess ulceration(s) every visit Notes: Electronic Signature(s) Signed: 03/08/2017 4:49:11 PM By: Montey Hora Entered By: Montey Hora on 03/08/2017 [name] [name], [place]. ([id number]) -------------------------------------------------------------------------------- Pain Assessment Details Patient Name: [name] [name] Date of Service: 03/08/2017 3:30 PM Medical Record Number: [id number] Patient Account Number: [id number] Date of Birth/Sex: 1923-02-08 (83 y.o. Female) Treating RN: Montey Hora Primary Care Amiera Herzberg: Celedonio Miyamoto Other Clinician: Referring Andrea Colglazier: Celedonio Miyamoto Treating Zamia Tyminski/Extender: Frann Rider in Treatment: 3 Active Problems Location of Pain Severity and Description of Pain Patient Has Paino No Site Locations With Dressing Change: No Pain Management and Medication Current Pain Management: Electronic Signature(s) Signed: 03/08/2017 4:49:11 PM By: Montey Hora Entered By: Montey Hora on 03/08/2017 15:52:52 [name] [name] ([id number]) -------------------------------------------------------------------------------- Patient/Caregiver Education Details Patient Name: [name] [name] Date of Service: 03/08/2017 3:30 PM Medical Record Number: [id number] Patient Account Number: [id number] Date of Birth/Gender: 1923/10/11 (83 y.o. Female) Treating RN: Montey Hora Primary Care Physician: Celedonio Miyamoto Other Clinician: Referring Physician: Celedonio Miyamoto Treating Physician/Extender: Frann Rider in Treatment: 3 Education Assessment Education Provided To: Patient and Caregiver Education Topics Provided Wound/Skin Impairment: Handouts: Other: wound care to  continue as ordered Methods: Explain/Verbal Responses: State content correctly Electronic Signature(s) Signed: 03/08/2017 4:49:11 PM By: Montey Hora Entered By: Montey Hora on 03/08/2017 16:39:37 [name] [name] ([id number]) -------------------------------------------------------------------------------- Wound Assessment Details Patient Name: [name] [name] Date of Service: 03/08/2017 3:30 PM Medical Record Number: [id number] Patient Account Number: [id number] Date of Birth/Sex: 28-Nov-1922 (83 y.o. Female) Treating RN: Montey Hora Primary Care Oryon Gary: Celedonio Miyamoto Other Clinician: Referring Kody Vigil: Celedonio Miyamoto Treating Shaleta Ruacho/Extender: Frann Rider in Treatment: 3 Wound Status Wound Number: 1 Primary Arterial Insufficiency Ulcer Etiology: Wound Location: Left Foot - Dorsal Wound Open Wounding Event: Gradually Appeared Status: Date Acquired: 02/08/2017 Comorbid Anemia, Congestive Heart Failure, Weeks Of Treatment: 3 History: Coronary Artery Disease, Hypertension, Clustered Wound: No Peripheral Venous Disease, Neuropathy Photos Photo Uploaded By: Montey Hora on 03/08/2017 16:40:17 Wound Measurements Length: (cm) 1.7 Width: (cm) 1.6 Depth: (cm) 0.1 Area: (cm) 2.136 Volume: (cm) 0.214 % Reduction in Area: -20.9% % Reduction in Volume: -20.9% Epithelialization: None Tunneling: No Undermining: No Wound Description Classification: Partial Thickness Foul Odor Aft Wound Margin: Distinct, outline attached Slough/Fibrin Exudate Amount: Large Exudate Type: Serosanguineous Exudate Color: red, brown er Cleansing: No o Yes Wound Bed Granulation Amount: Small (1-33%) Granulation Quality: Red Necrotic Amount: Large (67-100%) Necrotic Quality: Adherent [id number], [place]. ([id number]) Periwound Skin Texture Texture Color No Abnormalities Noted: No No Abnormalities Noted: No Moisture Temperature / Pain No Abnormalities Noted: No Temperature:  No Abnormality Maceration: Yes Tenderness on Palpation: Yes Wound Preparation Ulcer Cleansing: Rinsed/Irrigated with Saline Topical Anesthetic Applied: Other: lidocaine 4%, Treatment Notes Wound #1 (Left, Dorsal Foot) 1. Cleansed with: Clean wound with Normal Saline 2. Anesthetic Topical Lidocaine 4% cream to wound bed prior to debridement 4. Dressing Applied: Santyl Ointment 5. Secondary Dressing Applied Dry Gauze Kerlix/Conform 7. Secured with Recruitment consultant) Signed: 03/08/2017 4:49:11 PM By: Montey Hora Entered By: Montey Hora on 03/08/2017 16:00:23 [name] [name] ([id number]) -------------------------------------------------------------------------------- Wound Assessment Details Patient Name: [name] [name], GRUEN. Date of  Service: 03/08/2017 3:30 PM Medical Record Number: [id number] Patient Account Number: [id number] Date of Birth/Sex: 05-05-1923 (83 y.o. Female) Treating RN: Montey Hora Primary Care Malichi Palardy: Celedonio Miyamoto Other Clinician: Referring Kirsten Mckone: Celedonio Miyamoto Treating Jeanne Diefendorf/Extender: Frann Rider in Treatment: 3 Wound Status Wound Number: 4 Primary Pressure Ulcer Etiology: Wound Location: Left Gluteus Wound Open Wounding Event: Pressure Injury Status: Date Acquired: 01/18/2017 Comorbid Anemia, Congestive Heart Failure, Weeks Of Treatment: 3 History: Coronary Artery Disease, Hypertension, Clustered Wound: No Peripheral Venous Disease, Neuropathy Photos Photo Uploaded By: Montey Hora on 03/08/2017 16:40:17 Wound Measurements Length: (cm) 0.2 Width: (cm) 0.2 Depth: (cm) 0.1 Area: (cm) 0.031 Volume: (cm) 0.003 % Reduction in Area: 89% % Reduction in Volume: 89.3% Epithelialization: None Tunneling: No Undermining: No Wound Description Classification: Category/Stage II Foul Odor Aft Wound Margin: Distinct, outline attached Slough/Fibrin Exudate Amount: Medium Exudate Type: Serous Exudate Color: amber er  Cleansing: No o Yes Wound Bed Granulation Amount: Large (67-100%) Granulation Quality: Pink Necrotic Amount: None Present (0%) [name] [name], [name] O. ([id number]) Periwound Skin Texture Texture Color No Abnormalities Noted: No No Abnormalities Noted: No Moisture Temperature / Pain No Abnormalities Noted: No Temperature: No Abnormality Tenderness on Palpation: Yes Wound Preparation Ulcer Cleansing: Rinsed/Irrigated with Saline Topical Anesthetic Applied: Other: lidocaine 4%, Treatment Notes Wound #4 (Left Gluteus) 1. Cleansed with: Clean wound with Normal Saline 2. Anesthetic Topical Lidocaine 4% cream to wound bed prior to debridement 4. Dressing Applied: Aquacel Ag 5. Secondary Dressing Applied Bordered Foam Dressing Electronic Signature(s) Signed: 03/08/2017 4:49:11 PM By: Montey Hora Entered By: Montey Hora on 03/08/2017 16:13:31 [name] [name] ([id number]) -------------------------------------------------------------------------------- Wound Assessment Details Patient Name: [name] [name] Date of Service: 03/08/2017 3:30 PM Medical Record Number: [id number] Patient Account Number: [id number] Date of Birth/Sex: [birth year] (83 y.o. Female) Treating RN: Montey Hora Primary Care Kamau Weatherall: Celedonio Miyamoto Other Clinician: Referring Darian Cansler: Celedonio Miyamoto Treating Darrly Loberg/Extender: Frann Rider in Treatment: 3 Wound Status Wound Number: 5 Primary To be determined Etiology: Wound Location: Right Toe Second - Dorsal Wound Open Wounding Event: Gradually Appeared Status: Date Acquired: 02/08/2017 Comorbid Anemia, Congestive Heart Failure, Weeks Of Treatment: 1 History: Coronary Artery Disease, Hypertension, Clustered Wound: No Peripheral Venous Disease, Neuropathy Photos Photo Uploaded By: Montey Hora on 03/08/2017 16:40:53 Wound Measurements Length: (cm) 1 Width: (cm) 1 Depth: (cm) 0.1 Area: (cm) 0.785 Volume: (cm) 0.079 % Reduction in Area:  7.4% % Reduction in Volume: 7.1% Epithelialization: None Tunneling: No Undermining: No Wound Description Classification: Partial Thickness Foul Odor Aft Wound Margin: Distinct, outline attached Slough/Fibrin Exudate Amount: Large Exudate Type: Serous Exudate Color: amber er Cleansing: No o Yes Wound Bed Granulation Amount: Small (1-33%) Granulation Quality: Red Necrotic Amount: Large (67-100%) Necrotic Quality: [address], [name] O. ([id number]) Periwound Skin Texture Texture Color No Abnormalities Noted: No No Abnormalities Noted: No Moisture Temperature / Pain No Abnormalities Noted: No Temperature: No Abnormality Tenderness on Palpation: Yes Wound Preparation Ulcer Cleansing: Rinsed/Irrigated with Saline Topical Anesthetic Applied: Other: lidocaine 4%, Treatment Notes Wound #5 (Right, Dorsal Toe Second) 1. Cleansed with: Clean wound with Normal Saline 2. Anesthetic Topical Lidocaine 4% cream to wound bed prior to debridement 4. Dressing Applied: Santyl Ointment 5. Secondary Dressing Applied Dry Gauze Kerlix/Conform 7. Secured with Recruitment consultant) Signed: 03/08/2017 4:49:11 PM By: Montey Hora Entered By: Montey Hora on 03/08/2017 [name] [name], [name] [name] ([id number]) -------------------------------------------------------------------------------- [place] Details Patient Name: [name] [name] Date of Service: 03/08/2017 3:30 PM Medical Record Number: [id number] Patient Account Number: [id number] Date of Birth/Sex: [birth year] (83 y.o.  Female) Treating RN: Montey Hora Primary Care Ashla Murph: Celedonio Miyamoto Other Clinician: Referring Miryam Mcelhinney: Celedonio Miyamoto Treating Ailah Barna/Extender: Frann Rider in Treatment: 3 Vital Signs Time Taken: 15:52 Temperature (F): 98.3 Height (in): 66 Pulse (bpm): 70 Weight (lbs): 144 Respiratory Rate (breaths/min): 16 Body Mass Index (BMI): 23.2 Blood Pressure (mmHg): 183/61 Reference  Range: 80 - 120 mg / dl Electronic Signature(s) Signed: 03/08/2017 4:49:11 PM By: Montey Hora Entered By: Montey Hora on 03/08/2017 15:56:01

## 2017-03-10 DIAGNOSIS — I739 Peripheral vascular disease, unspecified: Secondary | ICD-10-CM | POA: Diagnosis not present

## 2017-03-10 DIAGNOSIS — B351 Tinea unguium: Secondary | ICD-10-CM | POA: Diagnosis not present

## 2017-03-11 DIAGNOSIS — I5033 Acute on chronic diastolic (congestive) heart failure: Secondary | ICD-10-CM | POA: Diagnosis not present

## 2017-03-11 DIAGNOSIS — J441 Chronic obstructive pulmonary disease with (acute) exacerbation: Secondary | ICD-10-CM | POA: Diagnosis not present

## 2017-03-11 DIAGNOSIS — I130 Hypertensive heart and chronic kidney disease with heart failure and stage 1 through stage 4 chronic kidney disease, or unspecified chronic kidney disease: Secondary | ICD-10-CM | POA: Diagnosis not present

## 2017-03-11 DIAGNOSIS — L89152 Pressure ulcer of sacral region, stage 2: Secondary | ICD-10-CM | POA: Diagnosis not present

## 2017-03-11 DIAGNOSIS — N189 Chronic kidney disease, unspecified: Secondary | ICD-10-CM | POA: Diagnosis not present

## 2017-03-11 DIAGNOSIS — E1122 Type 2 diabetes mellitus with diabetic chronic kidney disease: Secondary | ICD-10-CM | POA: Diagnosis not present

## 2017-03-16 DIAGNOSIS — L89152 Pressure ulcer of sacral region, stage 2: Secondary | ICD-10-CM | POA: Diagnosis not present

## 2017-03-16 DIAGNOSIS — E1122 Type 2 diabetes mellitus with diabetic chronic kidney disease: Secondary | ICD-10-CM | POA: Diagnosis not present

## 2017-03-16 DIAGNOSIS — N189 Chronic kidney disease, unspecified: Secondary | ICD-10-CM | POA: Diagnosis not present

## 2017-03-16 DIAGNOSIS — I5033 Acute on chronic diastolic (congestive) heart failure: Secondary | ICD-10-CM | POA: Diagnosis not present

## 2017-03-16 DIAGNOSIS — I130 Hypertensive heart and chronic kidney disease with heart failure and stage 1 through stage 4 chronic kidney disease, or unspecified chronic kidney disease: Secondary | ICD-10-CM | POA: Diagnosis not present

## 2017-03-16 DIAGNOSIS — J441 Chronic obstructive pulmonary disease with (acute) exacerbation: Secondary | ICD-10-CM | POA: Diagnosis not present

## 2017-03-18 ENCOUNTER — Encounter: Payer: Medicare Other | Admitting: Surgery

## 2017-03-18 DIAGNOSIS — Z885 Allergy status to narcotic agent status: Secondary | ICD-10-CM | POA: Diagnosis not present

## 2017-03-18 DIAGNOSIS — I70245 Atherosclerosis of native arteries of left leg with ulceration of other part of foot: Secondary | ICD-10-CM | POA: Diagnosis not present

## 2017-03-18 DIAGNOSIS — L97222 Non-pressure chronic ulcer of left calf with fat layer exposed: Secondary | ICD-10-CM | POA: Diagnosis not present

## 2017-03-18 DIAGNOSIS — L97522 Non-pressure chronic ulcer of other part of left foot with fat layer exposed: Secondary | ICD-10-CM | POA: Diagnosis not present

## 2017-03-18 DIAGNOSIS — I509 Heart failure, unspecified: Secondary | ICD-10-CM | POA: Diagnosis not present

## 2017-03-18 DIAGNOSIS — L89322 Pressure ulcer of left buttock, stage 2: Secondary | ICD-10-CM | POA: Diagnosis not present

## 2017-03-18 DIAGNOSIS — I739 Peripheral vascular disease, unspecified: Secondary | ICD-10-CM | POA: Diagnosis not present

## 2017-03-18 DIAGNOSIS — L89892 Pressure ulcer of other site, stage 2: Secondary | ICD-10-CM | POA: Diagnosis not present

## 2017-03-18 NOTE — Telephone Encounter (Signed)
Again was unable to reach patient will place a letter in that mail. Pt does have an appointment set on 05/28/2017 Thank you for the referral.

## 2017-03-19 NOTE — Progress Notes (Signed)
OANH, DEVIVO ([id number]) Visit Report for 03/18/2017 Arrival Information Details Patient Name: [name] [name], [name] [name]. Date of Service: 03/18/2017 3:30 PM Medical Record Number: [id number] Patient Account Number: [id number] Date of Birth/Sex: [birth year] (83 y.o. Female) Treating RN: Carolyne Fiscal, Debi Primary Care Alexanderia Gorby: Celedonio Miyamoto Other Clinician: Referring Agnes Brightbill: Celedonio Miyamoto Treating Hyun Reali/Extender: Frann Rider in Treatment: 5 Visit Information History Since Last Visit All ordered tests and consults were completed: No Patient Arrived: Wheel Chair Added or deleted any medications: No Arrival Time: 15:26 Any new allergies or adverse reactions: No Accompanied By: caregiver Had a fall or experienced change in No Transfer Assistance: EasyPivot Patient activities of daily living that may affect Lift risk of falls: Patient Identification Verified: Yes Signs or symptoms of abuse/neglect since last No Secondary Verification Process Yes visito Completed: Hospitalized since last visit: No Patient Requires Transmission- No Has Dressing in Place as Prescribed: Yes Based Precautions: Pain Present Now: No Patient Has Alerts: Yes Patient Alerts: Patient on Blood Thinner Plavix Electronic Signature(s) Signed: 03/18/2017 4:16:11 PM By: Alric Quan Entered By: Alric Quan on 03/18/2017 15:29:51 [name] [name] ([id number]) -------------------------------------------------------------------------------- Encounter Discharge Information Details Patient Name: [name] [name] Date of Service: 03/18/2017 3:30 PM Medical Record Number: [id number] Patient Account Number: [id number] Date of Birth/Sex: 10-07-1923 (83 y.o. Female) Treating RN: Carolyne Fiscal, Debi Primary Care Eirik Schueler: Celedonio Miyamoto Other Clinician: Referring Jinnie Onley: Celedonio Miyamoto Treating Amarissa Koerner/Extender: Frann Rider in Treatment: 5 Encounter Discharge Information Items Discharge Pain Level:  0 Discharge Condition: Stable Ambulatory Status: Wheelchair Discharge Destination: Nursing Home Transportation: Private Auto Accompanied By: caregiver Schedule Follow-up Appointment: Yes Medication Reconciliation completed and provided to Patient/Care No Terion Hedman: Provided on Clinical Summary of Care: 03/18/2017 Form Type Recipient Paper Patient DB Electronic Signature(s) Signed: 03/18/2017 4:16:11 PM By: Alric Quan Previous Signature: 03/18/2017 4:00:30 PM Version By: Ruthine Dose Entered By: Alric Quan on 03/18/2017 16:01:27 [name] [name] ([id number]) -------------------------------------------------------------------------------- Lower Extremity Assessment Details Patient Name: [name] [name] Date of Service: 03/18/2017 3:30 PM Medical Record Number: [id number] Patient Account Number: [id number] Date of Birth/Sex: 1923/01/01 (83 y.o. Female) Treating RN: Carolyne Fiscal, Debi Primary Care Dong Nimmons: Celedonio Miyamoto Other Clinician: Referring Amel Gianino: Celedonio Miyamoto Treating Amylah Will/Extender: Frann Rider in Treatment: 5 Vascular Assessment Pulses: Dorsalis Pedis Palpable: [Left:No] [Right:No] Doppler Audible: [Left:Yes] [Right:Yes] Posterior Tibial Extremity colors, hair growth, and conditions: Extremity Color: [Left:Mottled] [Right:Mottled] Temperature of Extremity: [Left:Cool] [Right:Cool] Capillary Refill: [Left:> 3 seconds] [Right:> 3 seconds] Electronic Signature(s) Signed: 03/18/2017 4:16:11 PM By: Alric Quan Entered By: Alric Quan on 03/18/2017 15:46:37 [name] [name] ([id number]) -------------------------------------------------------------------------------- Multi Wound Chart Details Patient Name: [name] [name] Date of Service: 03/18/2017 3:30 PM Medical Record Number: [id number] Patient Account Number: [id number] Date of Birth/Sex: 01-30-1923 (83 y.o. 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] [5:Pressure Ulcer] Comorbid History: [1:Anemia, Congestive Heart Anemia, Congestive Heart Anemia, Congestive Heart Failure, Coronary Artery Failure, Coronary Artery Failure, Coronary Artery Disease, Hypertension, Peripheral Venous Disease, Neuropathy] [4:Disease,  Hypertension, Peripheral Venous Disease, Neuropathy] [5:Disease, Hypertension, Peripheral Venous Disease, Neuropathy] Date Acquired: [1:02/08/2017] [4:01/18/2017] [5:02/08/2017] Weeks of Treatment: [1:5] [4:5] [5:2] Wound  Status: [1:Open] [4:Open] [5:Open] Measurements L x W x D 1.7x1.5x0.1 [4:0.2x0.3x0.1] [5:0.8x0.8x0.2] (cm) Area (cm) : [1:2.003] [4:0.047] [5:0.503] Volume (cm) : [1:0.2] [4:0.005] [5:0.101] % Reduction in Area: [1:-13.40%] [4:83.40%] [5:40.70%] % Reduction in Volume: -13.00% [4:82.10%] [5:-18.80%] Classification: [1:Partial Thickness] [4:Category/Stage II] [5:Category/Stage II] Exudate Amount: [1:Large] [4:Medium] [5:Large] Exudate Type: [1:Serous] [4:Serous] [5:Serous] Exudate Color: [1:amber] [4:amber] [5:amber] Wound Margin: [1:Distinct, outline attached Distinct, outline attached Distinct, outline attached] Granulation Amount: [1:Small (1-33%)] [4:None Present (0%)] [5:Small (1-33%)] Granulation Quality: [1:Red] [4:N/A] [5:Red] Necrotic Amount: [1:Large (67-100%)] [4:Large (67-100%)] [5:Large (67-100%)] Necrotic Tissue: [1:Adherent Slough]  [4:Eschar, Adherent Slough Adherent Slough] Epithelialization: [1:None] [4:None] [5:None] Debridement: [1:Debridement ([id number])] [4:N/A] [5:Debridement ([id number])] Pre-procedure 15:45 N/A 15:45 Verification/Time Out Taken: Pain Control: Lidocaine 4% Topical N/A Lidocaine 4% Topical Solution Solution Tissue Debrided: Fibrin/Slough, Exudates, N/A Fibrin/Slough, Exudates, Subcutaneous Subcutaneous Level: Skin/Subcutaneous N/A Skin/Subcutaneous Tissue Tissue Debridement Area (sq 2.55 N/A 0.64 cm): Instrument: Curette N/A Curette Bleeding: Minimum N/A Minimum Hemostasis Achieved: Pressure N/A Pressure Procedural Pain: 0 N/A 0 Post Procedural Pain: 0 N/A 0 Debridement Treatment Procedure was tolerated N/A Procedure was tolerated Response: well well Post Debridement 1.7x1.5x0.2 N/A 0.8x0.8x0.2 Measurements L x W x D (cm) Post Debridement 0.401 N/A 0.101 Volume: (cm) Post Debridement N/A N/A Category/Stage II Stage: Periwound Skin Texture: No Abnormalities Noted No Abnormalities Noted No Abnormalities Noted Periwound Skin Maceration: Yes No Abnormalities Noted No Abnormalities Noted Moisture: Periwound Skin Color: No Abnormalities Noted No Abnormalities Noted No Abnormalities Noted Temperature: No Abnormality No Abnormality No Abnormality Tenderness on Yes Yes Yes Palpation: Wound Preparation: Ulcer Cleansing: Ulcer Cleansing: Ulcer Cleansing: Rinsed/Irrigated with Rinsed/Irrigated with Rinsed/Irrigated with Saline Saline Saline Topical Anesthetic Topical Anesthetic Topical Anesthetic Applied: Other: lidocaine Applied: Other: lidocaine Applied: Other: lidocaine 4% 4% 4% Procedures Performed: Debridement N/A Debridement Treatment Notes Electronic Signature(s) Signed: 03/18/2017 4:00:27 PM By: Christin Fudge MD, FACS Entered By: Christin Fudge on 03/18/2017 16:00:27 [name] [name]  ([id number]) -------------------------------------------------------------------------------- [place] Details Patient Name: [name] [name], [name] [name]. Date of Service: 03/18/2017 3:30 PM Medical Record Number: [id number] Patient Account Number: [id number] Date of Birth/Sex: Oct 30, 1922 (83 y.o. Female) Treating RN: Carolyne Fiscal, Debi Primary Care Kaikoa Magro: Celedonio Miyamoto Other Clinician: Referring Norwood Quezada: Celedonio Miyamoto Treating Laura-Lee Villegas/Extender: Frann Rider in Treatment: 5 Active Inactive ` Abuse / Safety / Falls / Self Care Management Nursing Diagnoses: Potential for falls Goals: Patient will remain injury free Date Initiated: 02/11/2017 Target Resolution Date: 05/22/2017 Goal Status: Active Interventions: Assess fall risk on admission and as needed Assess self care needs on admission and as needed Notes: ` Nutrition Nursing Diagnoses: Imbalanced nutrition Potential for alteratiion in Nutrition/Potential for imbalanced nutrition Goals: Patient/caregiver agrees to and verbalizes understanding of need to use nutritional supplements and/or vitamins as prescribed Date Initiated: 02/11/2017 Target Resolution Date: 05/22/2017 Goal Status: Active Interventions: Assess patient nutrition upon admission and as needed per policy Notes: ` Orientation to the [place] [place]. ([id number]) Nursing Diagnoses: Knowledge deficit related to the wound healing center program Goals: Patient/caregiver will verbalize understanding of the [place] Program Date Initiated: 02/11/2017 Target Resolution Date: 02/20/2017 Goal Status: Active Interventions: Provide education on orientation to the wound center Notes: ` Pain, Acute or Chronic Nursing Diagnoses: Pain, acute or chronic: actual or potential Potential alteration in comfort, pain Goals: Patient/caregiver will verbalize adequate pain control between visits Date Initiated: 02/11/2017 Target  Resolution Date: 05/22/2017 Goal Status: Active Interventions: Assess comfort goal upon admission Complete pain assessment as per visit requirements Notes: ` Pressure Nursing  Diagnoses: Knowledge deficit related to causes and risk factors for pressure ulcer development Knowledge deficit related to management of pressures ulcers Potential for impaired tissue integrity related to pressure, friction, moisture, and shear Goals: Patient/caregiver will verbalize risk factors for pressure ulcer development Date Initiated: 02/11/2017 Target Resolution Date: 05/22/2017 Goal Status: Active Interventions: Assess: immobility, friction, shearing, incontinence upon admission and as needed PRUE, LINGENFELTER ([id number]) Notes: ` Wound/Skin Impairment Nursing Diagnoses: Impaired tissue integrity Knowledge deficit related to ulceration/compromised skin integrity Goals: Ulcer/skin breakdown will have a volume reduction of 80% by week 12 Date Initiated: 02/11/2017 Target Resolution Date: 05/22/2017 Goal Status: Active Interventions: Assess patient/caregiver ability to perform ulcer/skin care regimen upon admission and as needed Assess ulceration(s) every visit Notes: Electronic Signature(s) Signed: 03/18/2017 4:16:11 PM By: Alric Quan Entered By: Alric Quan on 03/18/2017 15:46:44 [name] [name] ([id number]) -------------------------------------------------------------------------------- Pain Assessment Details Patient Name: [name] [name] Date of Service: 03/18/2017 3:30 PM Medical Record Number: [id number] Patient Account Number: [id number] Date of Birth/Sex: 01-20-1923 (83 y.o. Female) Treating RN: Carolyne Fiscal, Debi Primary Care Veer Elamin: Celedonio Miyamoto Other Clinician: Referring Kristofor Michalowski: Celedonio Miyamoto Treating Lexany Belknap/Extender: Frann Rider in Treatment: 5 Active Problems Location of Pain Severity and Description of Pain Patient Has Paino No Site Locations With Dressing  Change: No Pain Management and Medication Current Pain Management: Electronic Signature(s) Signed: 03/18/2017 4:16:11 PM By: Alric Quan Entered By: Alric Quan on 03/18/2017 15:29:56 [name] [name] ([id number]) -------------------------------------------------------------------------------- Patient/Caregiver Education Details Patient Name: [name] [name] Date of Service: 03/18/2017 3:30 PM Medical Record Number: [id number] Patient Account Number: [id number] Date of Birth/Gender: 1923-06-10 (83 y.o. Female) Treating RN: Ahmed Prima Primary Care Physician: Celedonio Miyamoto Other Clinician: Referring Physician: Celedonio Miyamoto Treating Physician/Extender: Frann Rider in Treatment: 5 Education Assessment Education Provided To: Patient Education Topics Provided Wound/Skin Impairment: Handouts: Other: change dressing as ordered Methods: Demonstration, Explain/Verbal Responses: State content correctly Electronic Signature(s) Signed: 03/18/2017 4:16:11 PM By: Alric Quan Entered By: Alric Quan on 03/18/2017 16:01:41 [name] [name] ([id number]) -------------------------------------------------------------------------------- Wound Assessment Details Patient Name: [name] [name] Date of Service: 03/18/2017 3:30 PM Medical Record Number: [id number] Patient Account Number: [id number] Date of Birth/Sex: 27-Apr-1923 (83 y.o. Female) Treating RN: Carolyne Fiscal, Debi Primary Care Vessie Olmsted: Celedonio Miyamoto Other Clinician: Referring Milania Haubner: Celedonio Miyamoto Treating Shanen Norris/Extender: Frann Rider in Treatment: 5 Wound Status Wound Number: 1 Primary Arterial Insufficiency Ulcer Etiology: Wound Location: Left Foot - Dorsal Wound Open Wounding Event: Gradually Appeared Status: Date Acquired: 02/08/2017 Comorbid Anemia, Congestive Heart Failure, Weeks Of Treatment: 5 History: Coronary Artery Disease, Hypertension, Clustered Wound: No Peripheral Venous Disease,  Neuropathy Photos Photo Uploaded By: Alric Quan on 03/18/2017 16:10:00 Wound Measurements Length: (cm) 1.7 Width: (cm) 1.5 Depth: (cm) 0.1 Area: (cm) 2.003 Volume: (cm) 0.2 % Reduction in Area: -13.4% % Reduction in Volume: -13% Epithelialization: None Tunneling: No Undermining: No Wound Description Classification: Partial Thickness Foul Odor Aft Wound Margin: Distinct, outline attached Slough/Fibrin Exudate Amount: Large Exudate Type: Serous Exudate Color: amber er Cleansing: No o Yes Wound Bed Granulation Amount: Small (1-33%) Granulation Quality: Red Necrotic Amount: Large (67-100%) Necrotic Quality: Adherent [address] [place] O. ([id number]) Periwound Skin Texture Texture Color No Abnormalities Noted: No No Abnormalities Noted: No Moisture Temperature / Pain No Abnormalities Noted: No Temperature: No Abnormality Maceration: Yes Tenderness on Palpation: Yes Wound Preparation Ulcer Cleansing: Rinsed/Irrigated with Saline Topical Anesthetic Applied: Other: lidocaine 4%, Treatment Notes Wound #1 (Left, Dorsal Foot) 1. Cleansed with: Clean wound with Normal Saline 2. Anesthetic Topical Lidocaine 4% cream  to wound bed prior to debridement 4. Dressing Applied: Santyl Ointment 5. Secondary Dressing Applied ABD Pad Dry Gauze Kerlix/Conform 7. Secured with Recruitment consultant) Signed: 03/18/2017 4:16:11 PM By: Alric Quan Entered By: Alric Quan on 03/18/2017 15:35:47 [name] [name] ([id number]) -------------------------------------------------------------------------------- Wound Assessment Details Patient Name: [name] [name] Date of Service: 03/18/2017 3:30 PM Medical Record Number: [id number] Patient Account Number: [id number] Date of Birth/Sex: July 14, 1923 (83 y.o. Female) Treating RN: Carolyne Fiscal, Debi Primary Care Alishah Schulte: Celedonio Miyamoto Other Clinician: Referring Teryl Mcconaghy: Celedonio Miyamoto Treating Diaz Crago/Extender: Frann Rider in Treatment: 5 Wound Status Wound Number: 4 Primary Pressure Ulcer Etiology: Wound Location: Left Gluteus Wound Open Wounding Event: Pressure Injury Status: Date Acquired: 01/18/2017 Comorbid Anemia, Congestive Heart Failure, Weeks Of Treatment: 5 History: Coronary Artery Disease, Hypertension, Clustered Wound: No Peripheral Venous Disease, Neuropathy Photos Photo Uploaded By: Alric Quan on 03/18/2017 16:10:00 Wound Measurements Length: (cm) 0.2 Width: (cm) 0.3 Depth: (cm) 0.1 Area: (cm) 0.047 Volume: (cm) 0.005 % Reduction in Area: 83.4% % Reduction in Volume: 82.1% Epithelialization: None Tunneling: No Undermining: No Wound Description Classification: Category/Stage II Foul Odor Aft Wound Margin: Distinct, outline attached Slough/Fibrin Exudate Amount: Medium Exudate Type: Serous Exudate Color: amber er Cleansing: No o Yes Wound Bed Granulation Amount: None Present (0%) Necrotic Amount: Large (67-100%) Necrotic Quality: Eschar, Adherent [address] [place]. ([id number]) Periwound Skin Texture Texture Color No Abnormalities Noted: No No Abnormalities Noted: No Moisture Temperature / Pain No Abnormalities Noted: No Temperature: No Abnormality Tenderness on Palpation: Yes Wound Preparation Ulcer Cleansing: Rinsed/Irrigated with Saline Topical Anesthetic Applied: Other: lidocaine 4%, Treatment Notes Wound #4 (Left Gluteus) 1. Cleansed with: Clean wound with Normal Saline 2. Anesthetic Topical Lidocaine 4% cream to wound bed prior to debridement 3. Peri-wound Care: Skin Prep 4. Dressing Applied: Aquacel Ag 5. Secondary Dressing Applied Bordered Foam Dressing Dry Gauze Electronic Signature(s) Signed: 03/18/2017 4:16:11 PM By: Alric Quan Entered By: Alric Quan on 03/18/2017 15:40:21 [name] [name] ([id number]) -------------------------------------------------------------------------------- Wound Assessment  Details Patient Name: [name] [name] Date of Service: 03/18/2017 3:30 PM Medical Record Number: [id number] Patient Account Number: [id number] Date of Birth/Sex: [birth year] (83 y.o. Female) Treating RN: Carolyne Fiscal, Debi Primary Care Jennet Scroggin: Celedonio Miyamoto Other Clinician: Referring Ritisha Deitrick: Celedonio Miyamoto Treating Rustin Erhart/Extender: Frann Rider in Treatment: 5 Wound Status Wound Number: 5 Primary Pressure Ulcer Etiology: Wound Location: Right Toe Second - Dorsal Wound Open Wounding Event: Gradually Appeared Status: Date Acquired: 02/08/2017 Comorbid Anemia, Congestive Heart Failure, Weeks Of Treatment: 2 History: Coronary Artery Disease, Hypertension, Clustered Wound: No Peripheral Venous Disease, Neuropathy Photos Photo Uploaded By: Alric Quan on 03/18/2017 16:10:18 Wound Measurements Length: (cm) 0.8 Width: (cm) 0.8 Depth: (cm) 0.2 Area: (cm) 0.503 Volume: (cm) 0.101 % Reduction in Area: 40.7% % Reduction in Volume: -18.8% Epithelialization: None Tunneling: No Undermining: No Wound Description Classification: Category/Stage II Foul Odor Aft Wound Margin: Distinct, outline attached Slough/Fibrin Exudate Amount: Large Exudate Type: Serous Exudate Color: amber er Cleansing: No o Yes Wound Bed Granulation Amount: Small (1-33%) Granulation Quality: Red Necrotic Amount: Large (67-100%) Necrotic Quality: Adherent [address] [place]. ([id number]) Periwound Skin Texture Texture Color No Abnormalities Noted: No No Abnormalities Noted: No Moisture Temperature / Pain No Abnormalities Noted: No Temperature: No Abnormality Tenderness on Palpation: Yes Wound Preparation Ulcer Cleansing: Rinsed/Irrigated with Saline Topical Anesthetic Applied: Other: lidocaine 4%, Treatment Notes Wound #5 (Right, Dorsal Toe Second) 1. Cleansed with: Clean wound with Normal Saline 2. Anesthetic Topical Lidocaine 4% cream to wound bed prior to debridement 4.  Dressing Applied: Santyl Ointment 5. Secondary Dressing Applied Dry Gauze Kerlix/Conform 7. Secured with Recruitment consultant) Signed: 03/18/2017 4:16:11 PM By: Alric Quan Entered By: Alric Quan on 03/18/2017 15:36:57 [name] [name] ([id number]) -------------------------------------------------------------------------------- [place] Details Patient Name: [name] [name] Date of Service: 03/18/2017 3:30 PM Medical Record Number: [id number] Patient Account Number: [id number] Date of Birth/Sex: [birth year] (83 y.o. Female) Treating RN: Carolyne Fiscal, Debi Primary Care Wania Longstreth: Celedonio Miyamoto Other Clinician: Referring Danaiya Steadman: Celedonio Miyamoto Treating Copeland Neisen/Extender: Frann Rider in Treatment: 5 Vital Signs Time Taken: 15:30 Pulse (bpm): 77 Height (in): 66 Respiratory Rate (breaths/min): 16 Weight (lbs): 144 Blood Pressure (mmHg): 130/55 Body Mass Index (BMI): 23.2 Reference Range: 80 - 120 mg / dl Electronic Signature(s) Signed: 03/18/2017 4:16:11 PM By: Alric Quan Entered By: Alric Quan on 03/18/2017 15:30:10

## 2017-03-20 NOTE — Progress Notes (Signed)
[name] [name] ([id number]) Visit Report for 03/18/2017 Chief Complaint Document Details Patient Name: [name] [name], [name] [name]. Date of Service: 03/18/2017 3:30 PM Medical Record Number: [id number] Patient Account Number: [id number] Date of Birth/Sex: 1923-07-13 (83 y.o. Female) Treating RN: [name] [name] Primary Care Provider: Celedonio [name] Other Clinician: Referring Provider: Celedonio [name] Treating Provider/Extender: [name] [name] in Treatment: 5 Information Obtained from: Patient Chief Complaint Patient is at the clinic for treatment of an open pressure ulcer to her left gluteal area and other wounds on her left lower leg and foot, for 3 weeks now Electronic Signature(s) Signed: 03/18/2017 4:01:11 PM By: [name] Fudge MD, FACS Entered By: [name] [name] on 03/18/2017 16:01:10 [name] [name] ([id number]) -------------------------------------------------------------------------------- Debridement Details Patient Name: [name] [name] Date of Service: 03/18/2017 3:30 PM Medical Record Number: [id number] Patient Account Number: [id number] Date of Birth/Sex: September 08, 1923 (83 y.o. Female) Treating RN: [name] [name] Primary Care Provider: Celedonio [name] Other Clinician: Referring Provider: Celedonio [name] Treating Provider/Extender: [name] [name] in Treatment: 5 Debridement Performed for Wound #1 Left,Dorsal Foot Assessment: Performed By: Physician [name] Fudge, MD Debridement: Debridement Severity of Tissue Pre Fat layer exposed Debridement: Pre-procedure Verification/Time Out Yes - 15:45 Taken: Start Time: 15:46 Pain Control: Lidocaine 4% Topical Solution Level: Skin/Subcutaneous Tissue Total Area Debrided (L x 1.7 (cm) x 1.5 (cm) = 2.55 (cm) W): Tissue and other Viable, Non-Viable, Exudate, Fibrin/Slough, Subcutaneous material debrided: Instrument: Curette Bleeding: Minimum Hemostasis Achieved: Pressure End Time: 15:48 Procedural Pain: 0 Post Procedural Pain:  0 Response to Treatment: Procedure was tolerated well Post Debridement Measurements of Total Wound Length: (cm) 1.7 Width: (cm) 1.5 Depth: (cm) 0.2 Volume: (cm) 0.401 Character of Wound/Ulcer Post Requires Further Debridement Debridement: Severity of Tissue Post Debridement: Fat layer exposed Post Procedure Diagnosis Same as Pre-procedure Electronic Signature(s) Signed: 03/18/2017 4:00:37 PM By: [name] Fudge MD, FACS Signed: 03/18/2017 4:16:11 PM By: [name] [name] ([id number]) Entered By: [name] [name] on 03/18/2017 16:00:37 [name] [name] ([id number]) -------------------------------------------------------------------------------- Debridement Details Patient Name: [name] [name] Date of Service: 03/18/2017 3:30 PM Medical Record Number: [id number] Patient Account Number: [id number] Date of Birth/Sex: [birth year] (83 y.o. Female) Treating RN: [name] [name] Primary Care Provider: Celedonio [name] Other Clinician: Referring Provider: Celedonio [name] Treating Provider/Extender: [name] [name] in Treatment: 5 Debridement Performed for Wound #5 Right,Dorsal Toe Second Assessment: Performed By: Physician [name] Fudge, MD Debridement: Debridement Pre-procedure Verification/Time Out Yes - 15:45 Taken: Start Time: 15:49 Pain Control: Lidocaine 4% Topical Solution Level: Skin/Subcutaneous Tissue Total Area Debrided (L x 0.8 (cm) x 0.8 (cm) = 0.64 (cm) W): Tissue and other Viable, Non-Viable, Exudate, Fibrin/Slough, Subcutaneous material debrided: Instrument: Curette Bleeding: Minimum Hemostasis Achieved: Pressure End Time: 15:50 Procedural Pain: 0 Post Procedural Pain: 0 Response to Treatment: Procedure was tolerated well Post Debridement Measurements of Total Wound Length: (cm) 0.8 Stage: Category/Stage II Width: (cm) 0.8 Depth: (cm) 0.2 Volume: (cm) 0.101 Character of Wound/Ulcer Post Requires Further Debridement: Debridement Post  Procedure Diagnosis Same as Pre-procedure Notes the wound probes down to bone Electronic Signature(s) Signed: 03/18/2017 4:01:03 PM By: [name] Fudge MD, FACS Signed: 03/18/2017 4:16:11 PM By: [name] [name] ([id number]) Entered By: [name] [name] on 03/18/2017 16:01:03 [name] [name] ([id number]) -------------------------------------------------------------------------------- HPI Details Patient Name: [name] [name], [name]. Date of Service: 03/18/2017 3:30 PM Medical Record Number: [id number] Patient Account Number: [id number] Date of Birth/Sex: 1923/02/09 (83 y.o. Female) Treating RN: [name] [name] Primary Care Provider: Celedonio [name] Other Clinician: Referring Provider: Celedonio [name] Treating Provider/Extender: [name] [name] in Treatment:  5 History of Present Illness Location: left gluteal area, left foot, left lower extremity Quality: Patient reports experiencing a sharp pain to affected area(s). Severity: Patient states wound are getting worse. Duration: Patient has had the wound for < 3 weeks prior to presenting for treatment Timing: Pain in wound is Intermittent (comes and goes Context: The wound appeared gradually over time Modifying Factors: Other treatment(s) tried include:local care with Unna boots Associated Signs and Symptoms: Patient reports having increase swelling. HPI Description: 83 year old patient referred to as by her nurse sent [facility] for ulcers both legs,decubitus ulcer on the sacrum which she's had for about 3 weeks. The daughter is at the bedside does not know how these occurred and there was no fall or trauma Her past medical history is suggestive of chronic diastolic heart failure, hypertension, peripheral arterial disease, chronic venous insufficiency, generalized anxiety disorder, and has never been a smoker. She is also status post appendectomy, cholecystectomy, hysterectomy and left hip replacement. She is also had  a fracture of her left humerus in November 2017 and has been treated nonsurgically with a Sarmiento brace. 03/01/2017 -- since she was seen here the last time she has been admitted to the hospital between May 3 and 02/21/2017 with COPD exacerbation and pressure injuries to the skin. she was treated with doxycycline as she had an MRSA positive sputum but had no pneumonia. She was also diuresed with IV Lasix and this helped his CHF. 03/08/2017 -- she has improved markedly and her general health and overall is looking more positive Electronic Signature(s) Signed: 03/18/2017 4:01:16 PM By: [name] Fudge MD, FACS Entered By: [name] [name] on 03/18/2017 16:01:15 [name] [name] ([id number]) -------------------------------------------------------------------------------- Physical Exam Details Patient Name: [name] [name] Date of Service: 03/18/2017 3:30 PM Medical Record Number: [id number] Patient Account Number: [id number] Date of Birth/Sex: [birth year] (83 y.o. Female) Treating RN: [name] [name] Primary Care Provider: Celedonio [name] Other Clinician: Referring Provider: Celedonio [name] Treating Provider/Extender: [name] [name] in Treatment: 5 Constitutional . Pulse regular. Respirations normal and unlabored. Afebrile. . Eyes Nonicteric. Reactive to light. Ears, Nose, Mouth, and Throat Lips, teeth, and gums WNL.Marland Kitchen Moist mucosa without lesions. Neck supple and nontender. No palpable supraclavicular or cervical adenopathy. Normal sized without goiter. Respiratory WNL. No retractions.. Cardiovascular Pedal Pulses WNL. No clubbing, cyanosis or edema. Chest Breasts symmetical and no nipple discharge.. Breast tissue WNL, no masses, lumps, or tenderness.. Lymphatic No adneopathy. No adenopathy. No adenopathy. Musculoskeletal Adexa without tenderness or enlargement.. Digits and nails w/o clubbing, cyanosis, infection, petechiae, ischemia, or inflammatory conditions.. Integumentary (Hair,  Skin) No suspicious lesions. No crepitus or fluctuance. No peri-wound warmth or erythema. No masses.Marland Kitchen Psychiatric Judgement and insight Intact.. No evidence of depression, anxiety, or agitation.. Notes the area on her gluteal region is looking good and no sharp debridement was required today. The wound on the dorsum of her left foot and the right second toe needed sharp debridement and subcutaneous debris was removed and we will continue using Santyl on these wounds. The right second toe probes down to bone. Electronic Signature(s) Signed: 03/18/2017 4:02:10 PM By: [name] Fudge MD, FACS Entered By: [name] [name] on 03/18/2017 16:02:08 [name] [name] ([id number]) -------------------------------------------------------------------------------- Physician Orders Details Patient Name: [name] [name], [name] [name] Date of Service: 03/18/2017 3:30 PM Medical Record Number: [id number] Patient Account Number: [id number] Date of Birth/Sex: [birth year] (83 y.o. Female) Treating RN: [name] [name] Primary Care Provider: Celedonio [name] Other Clinician: Referring Provider: Celedonio [name] Treating Provider/Extender: [name] [name] in Treatment: 5 Verbal /  Phone Orders: Yes Clinician: [place], [name] Read Back and Verified: Yes Diagnosis Coding Wound Cleansing Wound #1 Left,Dorsal Foot o Clean wound with Normal Saline. o Cleanse wound with mild soap and water Wound #4 Left Gluteus o Clean wound with Normal Saline. o Cleanse wound with mild soap and water Wound #5 Right,Dorsal Toe Second o Clean wound with Normal Saline. o Cleanse wound with mild soap and water Anesthetic Wound #1 Left,Dorsal Foot o Topical Lidocaine 4% cream applied to wound bed prior to debridement - for clinic use Wound #4 Left Gluteus o Topical Lidocaine 4% cream applied to wound bed prior to debridement - for clinic use Wound #5 Right,Dorsal Toe Second o Topical Lidocaine 4% cream applied to wound bed prior to  debridement - for clinic use Skin Barriers/Peri-Wound Care Wound #4 Left Gluteus o Skin Prep Primary Wound Dressing Wound #1 Left,Dorsal Foot o Santyl Ointment Wound #4 Left Gluteus o Aquacel Ag - or equivalent to Wound #5 Right,Dorsal Toe Second [address] [place], [place] ([id number]) Secondary Dressing Wound #1 Left,Dorsal Foot o ABD pad o Dry Gauze o Gauze and Kerlix/Conform Wound #4 Left Gluteus o Boardered Foam Dressing Wound #5 Right,Dorsal Toe Second o Dry Gauze o Conform/Kerlix Dressing Change Frequency Wound #1 Left,Dorsal Foot o Change dressing every other day. Wound #4 Left Gluteus o Change dressing every other day. Wound #5 Right,Dorsal Toe Second o Change dressing every other day. Follow-up Appointments Wound #1 Left,Dorsal Foot o Return Appointment in 1 week. Wound #4 Left Gluteus o Return Appointment in 1 week. Wound #5 Right,Dorsal Toe Second o Return Appointment in 1 week. Edema Control Wound #1 Left,Dorsal Foot o Elevate legs to the level of the heart and pump ankles as often as possible Wound #5 Right,Dorsal Toe Second o Elevate legs to the level of the heart and pump ankles as often as possible Off-Loading Wound #1 Left,Dorsal Foot o Turn and reposition every 2 hours Wound #4 Left Gluteus o Turn and reposition every 2 hours [name] [name], [name] [name]. ([id number]) Wound #5 Right,Dorsal Toe Second o Turn and reposition every 2 hours Additional Orders / Instructions Wound #1 Left,Dorsal Foot o Increase protein intake. Wound #4 Left Gluteus o Increase protein intake. Wound #5 Right,Dorsal Toe Second o Increase protein intake. Home Health Wound #1 [place] [facility] o Home Health Nurse may visit PRN to address patientos wound care needs. o FACE TO FACE ENCOUNTER: MEDICARE and MEDICAID PATIENTS: I certify that this patient is under my care and that I had  a face-to-face encounter that meets the physician face-to-face encounter requirements with this patient on this date. The encounter with the patient was in whole or in part for the following MEDICAL CONDITION: (primary reason for [place]) MEDICAL NECESSITY: I certify, that based on my findings, NURSING services are a medically necessary home health service. HOME BOUND STATUS: I certify that my clinical findings support that this patient is homebound (i.e., Due to illness or injury, pt requires aid of supportive devices such as crutches, cane, wheelchairs, walkers, the use of special transportation or the assistance of another person to leave their place of residence. There is a normal inability to leave the home and doing so requires considerable and taxing effort. Other absences are for medical reasons / religious services and are infrequent or of short duration when for other reasons). o If current dressing causes regression in wound condition, may D/C ordered dressing product/s and apply Normal Saline Moist Dressing daily  until next [place] / Other MD appointment. [place] of regression in wound condition at [phone]. o Please direct any NON-WOUND related issues/requests for orders to patient's Primary Care Physician Wound #4 Left [place] Nurse may visit PRN to address patientos wound care needs. o FACE TO FACE ENCOUNTER: MEDICARE and MEDICAID PATIENTS: I certify that this patient is under my care and that I had a face-to-face encounter that meets the physician face-to-face encounter requirements with this patient on this date. The encounter with the patient was in whole or in part for the following MEDICAL CONDITION: (primary reason for [place]) MEDICAL NECESSITY: I certify, that based on my findings, NURSING services are a medically necessary home health service. HOME  BOUND STATUS: I certify that my clinical findings support that this patient is homebound (i.e., Due to illness or injury, pt requires aid of supportive devices such as crutches, cane, wheelchairs, walkers, the use of special transportation or the assistance of another person to leave their place of residence. There is a normal inability to leave the home and doing so requires considerable and taxing effort. [place], [place] ([id number]) absences are for medical reasons / religious services and are infrequent or of short duration when for other reasons). o If current dressing causes regression in wound condition, may D/C ordered dressing product/s and apply Normal Saline Moist Dressing daily until next [place] / Other MD appointment. [place] of regression in wound condition at [phone]. o Please direct any NON-WOUND related issues/requests for orders to patient's Primary Care Physician Wound #5 Right,Dorsal Toe [place] Nurse may visit PRN to address patientos wound care needs. o FACE TO FACE ENCOUNTER: MEDICARE and MEDICAID PATIENTS: I certify that this patient is under my care and that I had a face-to-face encounter that meets the physician face-to-face encounter requirements with this patient on this date. The encounter with the patient was in whole or in part for the following MEDICAL CONDITION: (primary reason for [place]) MEDICAL NECESSITY: I certify, that based on my findings, NURSING services are a medically necessary home health service. HOME BOUND STATUS: I certify that my clinical findings support that this patient is homebound (i.e., Due to illness or injury, pt requires aid of supportive devices such as crutches, cane, wheelchairs, walkers, the use of special transportation or the assistance of another person to leave their place of residence. There is a normal  inability to leave the home and doing so requires considerable and taxing effort. Other absences are for medical reasons / religious services and are infrequent or of short duration when for other reasons). o If current dressing causes regression in wound condition, may D/C ordered dressing product/s and apply Normal Saline Moist Dressing daily until next [place] / Other MD appointment. [place] of regression in wound condition at [phone]. o Please direct any NON-WOUND related issues/requests for orders to patient's Primary Care Physician Medications-please add to medication list. Wound #1 Left,Dorsal Foot o Santyl Enzymatic Ointment o Other: - Vitamin C, Zinc, MVI Wound #4 Left Gluteus o Other: - Vitamin C, Zinc, MVI Wound #5 Right,Dorsal Toe Second o Santyl Enzymatic Ointment o Other: - Vitamin C, Zinc, MVI Electronic Signature(s) Signed: 03/18/2017 4:16:11 PM By: Alric Quan Signed: 03/18/2017 4:34:04 PM By: [name] Fudge MD, FACS Entered By: Alric Quan on 03/18/2017 16:00:03 [place],  Lendon Collar ([id number]) -------------------------------------------------------------------------------- Problem List Details Patient Name: [name] [name], [name] [name]. Date of Service: 03/18/2017 3:30 PM Medical Record Number: [id number] Patient Account Number: [id number] Date of Birth/Sex: [birth year] (83 y.o. 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 02/11/2017 Yes exposed L97.522 Non-pressure chronic ulcer of other part of left foot with fat 02/11/2017 Yes layer exposed I73.9 Peripheral vascular disease,  unspecified 02/11/2017 Yes L97.513 Non-pressure chronic ulcer of other part of right foot with 03/18/2017 Yes necrosis of muscle Inactive Problems Resolved Problems Electronic Signature(s) Signed: 03/18/2017 4:10:20 PM By: [name] Fudge MD, FACS Previous Signature: 03/18/2017 4:00:14 PM Version By: [name] Fudge MD, FACS Entered By: [name] [name] on 03/18/2017 16:10:20 [name] [name] ([id number]) -------------------------------------------------------------------------------- Progress Note Details Patient Name: [name] [name] Date of Service: 03/18/2017 3:30 PM Medical Record Number: [id number] Patient Account Number: [id number] Date of Birth/Sex: 07-18-1923 (83 y.o. Female) Treating RN: [name] [name] Primary Care Provider: Celedonio [name] Other Clinician: Referring Provider: Celedonio [name] Treating Provider/Extender: [name] [name] in Treatment: 5 Subjective Chief Complaint Information obtained from Patient Patient is at the clinic for treatment of an open pressure ulcer to her left gluteal area and other wounds on her left lower leg and foot, for 3 weeks now History of Present Illness (HPI) The following HPI elements were documented for the patient's wound: Location: left gluteal area, left foot, left lower extremity Quality: Patient reports experiencing a sharp pain to affected area(s). Severity: Patient states wound are getting worse. Duration: Patient has had the wound for < 3 weeks prior to presenting for treatment Timing: Pain in wound is Intermittent (comes and goes Context: The wound appeared gradually over time Modifying Factors: Other treatment(s) tried include:local care with Unna boots Associated Signs and Symptoms: Patient reports having increase swelling. 83 year old patient referred to as by her nurse sent [facility] assisted [facility] for ulcers both legs,decubitus ulcer on the sacrum which she's had for about 3 weeks. The daughter is at the bedside does not know  how these occurred and there was no fall or trauma Her past medical history is suggestive of chronic diastolic heart failure, hypertension, peripheral arterial disease, chronic venous insufficiency, generalized anxiety disorder, and has never been a smoker. She is also status post appendectomy, cholecystectomy, hysterectomy and left hip replacement. She is also had a fracture of her left humerus in November 2017 and has been treated nonsurgically with a Sarmiento brace. 03/01/2017 -- since she was seen here the last time she has been admitted to the hospital between May 3 and 02/21/2017 with COPD exacerbation and pressure injuries to the skin. she was treated with doxycycline as she had an MRSA positive sputum but had no pneumonia. She was also diuresed with IV Lasix and this helped his CHF. 03/08/2017 -- she has improved markedly and her general health and overall is looking more positive Objective Constitutional [name] [name], [name] [name] ([id number]) Pulse regular. Respirations normal and unlabored. Afebrile. Vitals Time Taken: 3:30 PM, Height: 66 in, Weight: 144 lbs, BMI: 23.2, Pulse: 77 bpm, Respiratory Rate: 16 breaths/min, Blood Pressure: 130/55 mmHg. Eyes Nonicteric. Reactive to light. Ears, Nose, Mouth, and Throat Lips, teeth, and gums WNL.Marland Kitchen Moist mucosa without lesions. Neck supple and nontender. No palpable supraclavicular or cervical adenopathy. Normal sized without goiter.  Respiratory WNL. No retractions.. Cardiovascular Pedal Pulses WNL. No clubbing, cyanosis or edema. Chest Breasts symmetical and no nipple discharge.. Breast tissue WNL, no masses, lumps, or tenderness.. Lymphatic No adneopathy. No adenopathy. No adenopathy. Musculoskeletal Adexa without tenderness or enlargement.. Digits and nails w/o clubbing, cyanosis, infection, petechiae, ischemia, or inflammatory conditions.Marland Kitchen Psychiatric Judgement and insight Intact.. No evidence of depression, anxiety, or  agitation.. General Notes: the area on her gluteal region is looking good and no sharp debridement was required today. The wound on the dorsum of her left foot and the right second toe needed sharp debridement and subcutaneous debris was removed and we will continue using Santyl on these wounds. The right second toe probes down to bone. Integumentary (Hair, Skin) No suspicious lesions. No crepitus or fluctuance. No peri-wound warmth or erythema. No masses.. Wound #1 status is Open. Original cause of wound was Gradually Appeared. The wound is located on the Left,Dorsal Foot. The wound measures 1.7cm length x 1.5cm width x 0.1cm depth; 2.003cm^2 area and 0.2cm^3 volume. There is no tunneling or undermining noted. There is a large amount of serous drainage noted. The wound margin is distinct with the outline attached to the wound base. There is small (1-33%) red granulation within the wound bed. There is a large (67-100%) amount of necrotic tissue within the wound bed including Adherent Slough. The periwound skin appearance exhibited: Maceration. Periwound temperature was noted as No Abnormality. The periwound has tenderness on palpation. [name] [name], [name] [name] ([id number]) Wound #4 status is Open. Original cause of wound was Pressure Injury. The wound is located on the Left Gluteus. The wound measures 0.2cm length x 0.3cm width x 0.1cm depth; 0.047cm^2 area and 0.005cm^3 volume. There is no tunneling or undermining noted. There is a medium amount of serous drainage noted. The wound margin is distinct with the outline attached to the wound base. There is no granulation within the wound bed. There is a large (67-100%) amount of necrotic tissue within the wound bed including Eschar and Adherent Slough. Periwound temperature was noted as No Abnormality. The periwound has tenderness on palpation. Wound #5 status is Open. Original cause of wound was Gradually Appeared. The wound is located on  the Right,Dorsal Toe Second. The wound measures 0.8cm length x 0.8cm width x 0.2cm depth; 0.503cm^2 area and 0.101cm^3 volume. There is no tunneling or undermining noted. There is a large amount of serous drainage noted. The wound margin is distinct with the outline attached to the wound base. There is small (1-33%) red granulation within the wound bed. There is a large (67-100%) amount of necrotic tissue within the wound bed including Adherent Slough. Periwound temperature was noted as No Abnormality. The periwound has tenderness on palpation. Assessment Active Problems ICD-10 L89.322 - Pressure ulcer of left buttock, stage 2 L97.222 - Non-pressure chronic ulcer of left calf with fat layer exposed L97.522 - Non-pressure chronic ulcer of other part of left foot with fat layer exposed I73.9 - Peripheral vascular disease, unspecified L97.513 - Non-pressure chronic ulcer of other part of right foot with necrosis of muscle Procedures Wound #1 Pre-procedure diagnosis of Wound #1 is an Arterial Insufficiency Ulcer located on the Left,Dorsal Foot .Severity of Tissue Pre Debridement is: Fat layer exposed. There was a Skin/Subcutaneous Tissue Debridement ([id number]) debridement with total area of 2.55 sq cm performed by [name] Fudge, MD. with the following instrument(s): Curette to remove Viable and Non-Viable tissue/material including Exudate, Fibrin/Slough, and Subcutaneous after achieving pain control using Lidocaine 4% Topical Solution. A time out  was conducted at 15:45, prior to the start of the procedure. A Minimum amount of bleeding was controlled with Pressure. The procedure was tolerated well with a pain level of 0 throughout and a pain level of 0 following the procedure. Post Debridement Measurements: 1.7cm length x 1.5cm width x 0.2cm depth; 0.401cm^3 volume. Character of Wound/Ulcer Post Debridement requires further debridement. Severity of Tissue Post Debridement is: Fat layer  exposed. Post procedure Diagnosis Wound #1: Same as Pre-Procedure [name] [name], VERGA. ([id number]) Wound #5 Pre-procedure diagnosis of Wound #5 is a Pressure Ulcer located on the Right,Dorsal Toe Second . There was a Skin/Subcutaneous Tissue Debridement ([id number]) debridement with total area of 0.64 sq cm performed by [name] Fudge, MD. with the following instrument(s): Curette to remove Viable and Non-Viable tissue/material including Exudate, Fibrin/Slough, and Subcutaneous after achieving pain control using Lidocaine 4% Topical Solution. A time out was conducted at 15:45, prior to the start of the procedure. A Minimum amount of bleeding was controlled with Pressure. The procedure was tolerated well with a pain level of 0 throughout and a pain level of 0 following the procedure. Post Debridement Measurements: 0.8cm length x 0.8cm width x 0.2cm depth; 0.101cm^3 volume. Post debridement Stage noted as Category/Stage II. Character of Wound/Ulcer Post Debridement requires further debridement. Post procedure Diagnosis Wound #5: Same as Pre-Procedure General Notes: the wound probes down to bone. Plan Wound Cleansing: Wound #1 Left,Dorsal Foot: Clean wound with Normal Saline. Cleanse wound with mild soap and water Wound #4 Left Gluteus: Clean wound with Normal Saline. Cleanse wound with mild soap and water Wound #5 Right,Dorsal Toe Second: Clean wound with Normal Saline. Cleanse wound with mild soap and water Anesthetic: Wound #1 Left,Dorsal Foot: Topical Lidocaine 4% cream applied to wound bed prior to debridement - for clinic use Wound #4 Left Gluteus: Topical Lidocaine 4% cream applied to wound bed prior to debridement - for clinic use Wound #5 Right,Dorsal Toe Second: Topical Lidocaine 4% cream applied to wound bed prior to debridement - for clinic use Skin Barriers/Peri-Wound Care: Wound #4 Left Gluteus: Skin Prep Primary Wound Dressing: Wound #1 Left,Dorsal Foot: Santyl  Ointment Wound #4 Left Gluteus: Aquacel Ag - or equivalent to Wound #5 Right,Dorsal Toe Second: Santyl Ointment Secondary Dressing: TRIXY, LOYOLA ([id number]) Wound #1 Left,Dorsal Foot: ABD pad Dry Gauze Gauze and Kerlix/Conform Wound #4 Left Gluteus: Boardered Foam Dressing Wound #5 Right,Dorsal Toe Second: Dry Gauze Conform/Kerlix Dressing Change Frequency: Wound #1 Left,Dorsal Foot: Change dressing every other day. Wound #4 Left Gluteus: Change dressing every other day. Wound #5 Right,Dorsal Toe Second: Change dressing every other day. Follow-up Appointments: Wound #1 Left,Dorsal Foot: Return Appointment in 1 week. Wound #4 Left Gluteus: Return Appointment in 1 week. Wound #5 Right,Dorsal Toe Second: Return Appointment in 1 week. Edema Control: Wound #1 Left,Dorsal Foot: Elevate legs to the level of the heart and pump ankles as often as possible Wound #5 Right,Dorsal Toe Second: Elevate legs to the level of the heart and pump ankles as often as possible Off-Loading: Wound #1 Left,Dorsal Foot: Turn and reposition every 2 hours Wound #4 Left Gluteus: Turn and reposition every 2 hours Wound #5 Right,Dorsal Toe Second: Turn and reposition every 2 hours Additional Orders / Instructions: Wound #1 Left,Dorsal Foot: Increase protein intake. Wound #4 Left Gluteus: Increase protein intake. Wound #5 Right,Dorsal Toe Second: Increase protein intake. Home Health: Wound #1 Left,Dorsal Foot: [place] Nurse may visit PRN to address patient s wound care needs. FACE  TO FACE ENCOUNTER: MEDICARE and MEDICAID PATIENTS: I certify that this patient is under my care and that I had a face-to-face encounter that meets the physician face-to-face encounter requirements with this patient on this date. The encounter with the patient was in whole or in part for the following MEDICAL CONDITION: (primary reason for [place]) MEDICAL  NECESSITY: I certify, that based on my findings, NURSING services are a medically necessary home health service. HOME MAYELA, BULLARD ([id number]) BOUND STATUS: I certify that my clinical findings support that this patient is homebound (i.e., Due to illness or injury, pt requires aid of supportive devices such as crutches, cane, wheelchairs, walkers, the use of special transportation or the assistance of another person to leave their place of residence. There is a normal inability to leave the home and doing so requires considerable and taxing effort. Other absences are for medical reasons / religious services and are infrequent or of short duration when for other reasons). If current dressing causes regression in wound condition, may D/C ordered dressing product/s and apply Normal Saline Moist Dressing daily until next [place] / Other MD appointment. [place] of regression in wound condition at [phone]. Please direct any NON-WOUND related issues/requests for orders to patient's Primary Care Physician Wound #4 Left Gluteus: [place] Nurse may visit PRN to address patient s wound care needs. FACE TO FACE ENCOUNTER: MEDICARE and MEDICAID PATIENTS: I certify that this patient is under my care and that I had a face-to-face encounter that meets the physician face-to-face encounter requirements with this patient on this date. The encounter with the patient was in whole or in part for the following MEDICAL CONDITION: (primary reason for [place]) MEDICAL NECESSITY: I certify, that based on my findings, NURSING services are a medically necessary home health service. HOME BOUND STATUS: I certify that my clinical findings support that this patient is homebound (i.e., Due to illness or injury, pt requires aid of supportive devices such as crutches, cane, wheelchairs, walkers, the use of special transportation or the  assistance of another person to leave their place of residence. There is a normal inability to leave the home and doing so requires considerable and taxing effort. Other absences are for medical reasons / religious services and are infrequent or of short duration when for other reasons). If current dressing causes regression in wound condition, may D/C ordered dressing product/s and apply Normal Saline Moist Dressing daily until next [place] / Other MD appointment. [place] of regression in wound condition at [phone]. Please direct any NON-WOUND related issues/requests for orders to patient's Primary Care Physician Wound #5 Right,Dorsal Toe Second: [place] Nurse may visit PRN to address patient s wound care needs. FACE TO FACE ENCOUNTER: MEDICARE and MEDICAID PATIENTS: I certify that this patient is under my care and that I had a face-to-face encounter that meets the physician face-to-face encounter requirements with this patient on this date. The encounter with the patient was in whole or in part for the following MEDICAL CONDITION: (primary reason for [place]) MEDICAL NECESSITY: I certify, that based on my findings, NURSING services are a medically necessary home health service. HOME BOUND STATUS: I certify that my clinical findings support that this patient is homebound (i.e., Due to illness or injury, pt requires aid of supportive devices such as crutches, cane, wheelchairs, walkers, the use of special transportation or the  assistance of another person to leave their place of residence. There is a normal inability to leave the home and doing so requires considerable and taxing effort. Other absences are for medical reasons / religious services and are infrequent or of short duration when for other reasons). If current dressing causes regression in wound condition, may D/C ordered dressing product/s and  apply Normal Saline Moist Dressing daily until next [place] / Other MD appointment. [place] of regression in wound condition at [phone]. Please direct any NON-WOUND related issues/requests for orders to patient's Primary Care Physician Medications-please add to medication list.: Wound #1 Left,Dorsal Foot: Santyl Enzymatic Ointment Other: - Vitamin C, Zinc, MVI Wound #4 Left Gluteus: Other: - Vitamin C, Zinc, MVI Wound #5 Right,Dorsal Toe Second: Santyl Enzymatic Ointment [name] [name], [name] [name] ([id number]) Other: - Vitamin C, Zinc, MVI The patient has improved and is overall looking much better. After review I have recommended: 1. Silver alginate and a bordered foam to the sacral and left gluteal area 2. Santyl ointment to the dorsum of the left foot. A light Kerlix and Coban dressing to be applied 3. Santyl ointment to the right second toe 4. supportive care, with adequate protein, vitamin E, vitamin C and zinc has also been emphasized 5. offloading has been discussed a lot with her caregivers. Electronic Signature(s) Signed: 03/18/2017 4:10:36 PM By: [name] Fudge MD, FACS Previous Signature: 03/18/2017 4:06:32 PM Version By: [name] Fudge MD, FACS Entered By: [name] [name] on 03/18/2017 16:10:36 [name] [name] ([id number]) -------------------------------------------------------------------------------- SuperBill Details Patient Name: [name] [name] Date of Service: 03/18/2017 Medical Record Number: [id number] Patient Account Number: [id number] Date of Birth/Sex: [birth year] (83 y.o. 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 L97.522  Non-pressure chronic ulcer of other part of left foot with fat layer exposed I73.9 Peripheral vascular disease, unspecified L97.513 Non-pressure chronic ulcer of other part of right foot with necrosis of muscle Facility Procedures CPT4: Description Modifier Quantity Code [id number] 11042 - DEB SUBQ TISSUE 20 SQ CM/< 1 ICD-10 Description Diagnosis L97.522 Non-pressure chronic ulcer of other part of left foot with fat layer exposed L89.322 Pressure ulcer of left buttock, stage 2 I73.9  Peripheral vascular disease, unspecified L97.513 Non-pressure chronic ulcer of other part of right foot with necrosis of muscle Physician Procedures CPT4: Description Modifier Quantity Code [id number] - WC PHYS SUBQ TISS 20 SQ CM 1 ICD-10 Description Diagnosis L97.522 Non-pressure chronic ulcer of other part of left foot with fat layer exposed L89.322 Pressure ulcer of left buttock, stage 2 I73.9  Peripheral vascular disease, unspecified L97.513 Non-pressure chronic ulcer of other part of right foot with necrosis of muscle Electronic Signature(s) Signed: 03/18/2017 4:10:53 PM By: [name] Fudge MD, FACS AILED, [place] ([id number]) Previous Signature: 03/18/2017 4:09:47 PM Version By: [name] Fudge MD, FACS Entered By: [name] [name] on 03/18/2017 16:10:52

## 2017-03-22 ENCOUNTER — Ambulatory Visit: Payer: Medicare Other | Admitting: Surgery

## 2017-03-23 DIAGNOSIS — J441 Chronic obstructive pulmonary disease with (acute) exacerbation: Secondary | ICD-10-CM | POA: Diagnosis not present

## 2017-03-23 DIAGNOSIS — E1122 Type 2 diabetes mellitus with diabetic chronic kidney disease: Secondary | ICD-10-CM | POA: Diagnosis not present

## 2017-03-23 DIAGNOSIS — L89152 Pressure ulcer of sacral region, stage 2: Secondary | ICD-10-CM | POA: Diagnosis not present

## 2017-03-23 DIAGNOSIS — I5033 Acute on chronic diastolic (congestive) heart failure: Secondary | ICD-10-CM | POA: Diagnosis not present

## 2017-03-23 DIAGNOSIS — I130 Hypertensive heart and chronic kidney disease with heart failure and stage 1 through stage 4 chronic kidney disease, or unspecified chronic kidney disease: Secondary | ICD-10-CM | POA: Diagnosis not present

## 2017-03-23 DIAGNOSIS — N189 Chronic kidney disease, unspecified: Secondary | ICD-10-CM | POA: Diagnosis not present

## 2017-03-25 ENCOUNTER — Encounter: Payer: Medicare Other | Attending: Surgery | Admitting: Surgery

## 2017-03-25 DIAGNOSIS — Z79899 Other long term (current) drug therapy: Secondary | ICD-10-CM | POA: Diagnosis not present

## 2017-03-25 DIAGNOSIS — Z885 Allergy status to narcotic agent status: Secondary | ICD-10-CM | POA: Diagnosis not present

## 2017-03-25 DIAGNOSIS — E785 Hyperlipidemia, unspecified: Secondary | ICD-10-CM | POA: Insufficient documentation

## 2017-03-25 DIAGNOSIS — L97222 Non-pressure chronic ulcer of left calf with fat layer exposed: Secondary | ICD-10-CM | POA: Insufficient documentation

## 2017-03-25 DIAGNOSIS — G629 Polyneuropathy, unspecified: Secondary | ICD-10-CM | POA: Insufficient documentation

## 2017-03-25 DIAGNOSIS — D649 Anemia, unspecified: Secondary | ICD-10-CM | POA: Insufficient documentation

## 2017-03-25 DIAGNOSIS — K219 Gastro-esophageal reflux disease without esophagitis: Secondary | ICD-10-CM | POA: Diagnosis not present

## 2017-03-25 DIAGNOSIS — L89322 Pressure ulcer of left buttock, stage 2: Secondary | ICD-10-CM | POA: Diagnosis not present

## 2017-03-25 DIAGNOSIS — S91106A Unspecified open wound of unspecified lesser toe(s) without damage to nail, initial encounter: Secondary | ICD-10-CM | POA: Diagnosis not present

## 2017-03-25 DIAGNOSIS — I890 Lymphedema, not elsewhere classified: Secondary | ICD-10-CM | POA: Diagnosis not present

## 2017-03-25 DIAGNOSIS — I110 Hypertensive heart disease with heart failure: Secondary | ICD-10-CM | POA: Insufficient documentation

## 2017-03-25 DIAGNOSIS — I2510 Atherosclerotic heart disease of native coronary artery without angina pectoris: Secondary | ICD-10-CM | POA: Diagnosis not present

## 2017-03-25 DIAGNOSIS — Z8673 Personal history of transient ischemic attack (TIA), and cerebral infarction without residual deficits: Secondary | ICD-10-CM | POA: Diagnosis not present

## 2017-03-25 DIAGNOSIS — I739 Peripheral vascular disease, unspecified: Secondary | ICD-10-CM | POA: Diagnosis not present

## 2017-03-25 DIAGNOSIS — L97522 Non-pressure chronic ulcer of other part of left foot with fat layer exposed: Secondary | ICD-10-CM | POA: Insufficient documentation

## 2017-03-25 DIAGNOSIS — I70245 Atherosclerosis of native arteries of left leg with ulceration of other part of foot: Secondary | ICD-10-CM | POA: Diagnosis not present

## 2017-03-25 DIAGNOSIS — I509 Heart failure, unspecified: Secondary | ICD-10-CM | POA: Diagnosis not present

## 2017-03-25 DIAGNOSIS — Z7982 Long term (current) use of aspirin: Secondary | ICD-10-CM | POA: Insufficient documentation

## 2017-03-25 DIAGNOSIS — I872 Venous insufficiency (chronic) (peripheral): Secondary | ICD-10-CM | POA: Diagnosis not present

## 2017-03-25 DIAGNOSIS — L97513 Non-pressure chronic ulcer of other part of right foot with necrosis of muscle: Secondary | ICD-10-CM | POA: Insufficient documentation

## 2017-03-25 DIAGNOSIS — L89152 Pressure ulcer of sacral region, stage 2: Secondary | ICD-10-CM | POA: Diagnosis not present

## 2017-03-26 DIAGNOSIS — J3489 Other specified disorders of nose and nasal sinuses: Secondary | ICD-10-CM | POA: Diagnosis not present

## 2017-03-26 DIAGNOSIS — E785 Hyperlipidemia, unspecified: Secondary | ICD-10-CM | POA: Diagnosis not present

## 2017-03-26 DIAGNOSIS — I872 Venous insufficiency (chronic) (peripheral): Secondary | ICD-10-CM | POA: Diagnosis not present

## 2017-03-26 DIAGNOSIS — S42352G Displaced comminuted fracture of shaft of humerus, left arm, subsequent encounter for fracture with delayed healing: Secondary | ICD-10-CM | POA: Diagnosis not present

## 2017-03-26 DIAGNOSIS — J449 Chronic obstructive pulmonary disease, unspecified: Secondary | ICD-10-CM | POA: Diagnosis not present

## 2017-03-26 NOTE — Progress Notes (Signed)
META, KROENKE ([id number]) Visit Report for 03/25/2017 Arrival Information Details Patient Name: [name] [name], [name] [name]. Date of Service: 03/25/2017 2:00 PM Medical Record Number: [id number] Patient Account Number: [id number] Date of Birth/Sex: December 25, 1922 (83 y.o. Female) Treating RN: Afful, RN, BSN, Velva Harman Primary Care Mysty Kielty: Celedonio Miyamoto Other Clinician: Referring Lorretta Kerce: Celedonio Miyamoto Treating Siarah Deleo/Extender: Frann Rider in Treatment: 6 Visit Information History Since Last Visit All ordered tests and consults were completed: No Patient Arrived: Wheel Chair Added or deleted any medications: No Arrival Time: 14:07 Any new allergies or adverse reactions: No Accompanied By: caregiver Had a fall or experienced change in No Transfer Assistance: None activities of daily living that may affect Patient Identification Verified: Yes risk of falls: Secondary Verification Process Yes Signs or symptoms of abuse/neglect since last No Completed: visito Patient Requires Transmission- No Hospitalized since last visit: No Based Precautions: Has Dressing in Place as Prescribed: Yes Patient Has Alerts: Yes Pain Present Now: No Patient Alerts: Patient on Blood Thinner Plavix Electronic Signature(s) Signed: 03/25/2017 4:19:01 PM By: Regan Lemming BSN, RN Entered By: Regan Lemming on 03/25/2017 [place], [place]. ([id number]) -------------------------------------------------------------------------------- Encounter Discharge Information Details Patient Name: [name] [name] Date of Service: 03/25/2017 2:00 PM Medical Record Number: [id number] Patient Account Number: [id number] Date of Birth/Sex: 03-09-1923 (83 y.o. Female) Treating RN: Baruch Gouty, RN, BSN, Velva Harman Primary Care Daevion Navarette: Celedonio Miyamoto Other Clinician: Referring Lacye Mccarn: Celedonio Miyamoto Treating Reem Fleury/Extender: Frann Rider in Treatment: 6 Encounter Discharge Information Items Discharge Pain Level: 0 Discharge  Condition: Stable Ambulatory Status: Wheelchair Discharge Destination: Nursing Home Transportation: Private Auto Accompanied By: caregiver Schedule Follow-up Appointment: No Medication Reconciliation completed No and provided to Patient/Care Athena Baltz: Provided on Clinical Summary of Care: 03/25/2017 Form Type Recipient Paper Patient DB Electronic Signature(s) Signed: 03/25/2017 2:36:11 PM By: Sharon Mt Entered By: Sharon Mt on 03/25/2017 14:36:11 [name] [name] ([id number]) -------------------------------------------------------------------------------- Lower Extremity Assessment Details Patient Name: [name] [name] Date of Service: 03/25/2017 2:00 PM Medical Record Number: [id number] Patient Account Number: [id number] Date of Birth/Sex: 07-09-1923 (83 y.o. Female) Treating RN: Afful, RN, BSN, [employer] Primary Care Memorie Yokoyama: Celedonio Miyamoto Other Clinician: Referring Jacquelyn Shadrick: Celedonio Miyamoto Treating Elianne Gubser/Extender: Frann Rider in Treatment: 6 Edema Assessment Assessed: [Left: No] [Right: No] Edema: [Left: Yes] [Right: Yes] Vascular Assessment Claudication: Claudication Assessment [Left:None] [Right:None] Pulses: Dorsalis Pedis Palpable: [Left:No] [Right:No] Doppler Audible: [Left:Yes] [Right:Yes] Posterior Tibial Extremity colors, hair growth, and conditions: Extremity Color: [Left:Mottled] [Right:Mottled] Hair Growth on Extremity: [Left:No] [Right:No] Temperature of Extremity: [Left:Warm] [Right:Warm] Capillary Refill: [Left:< 3 seconds] [Right:< 3 seconds] Toe Nail Assessment Left: Right: Thick: Yes Yes Discolored: Yes Yes Deformed: Yes Yes Improper Length and Hygiene: Yes Yes Electronic Signature(s) Signed: 03/25/2017 4:19:01 PM By: Regan Lemming BSN, RN Entered By: Regan Lemming on 03/25/2017 14:25:53 [name] [name] ([id number]) -------------------------------------------------------------------------------- Multi Wound Chart Details Patient Name: [name] [name] Date of Service: 03/25/2017 2:00 PM Medical Record Number: [id number] Patient Account Number: [id number] Date of Birth/Sex: 29-Jun-1923 (83 y.o. Female) Treating RN: Baruch Gouty, RN, BSN, [place] Primary Care Marquisha Nikolov: Celedonio Miyamoto Other Clinician: Referring Lien Lyman: Celedonio Miyamoto Treating Al Gagen/Extender: Frann Rider in Treatment: 6 Vital Signs Height(in): 66 Pulse(bpm): 72 Weight(lbs): 144 Blood Pressure 105/48 (mmHg): Body Mass Index(BMI): 23 Temperature(F): 97.7 Respiratory Rate 17 (breaths/min): Photos: [1:No Photos] [4:No Photos] [5:No Photos] Wound Location: [1:Left Foot - Dorsal] [4:Right Coccyx] [5:Right Toe Second - Dorsal] Wounding Event: [1:Gradually Appeared] [4:Pressure Injury] [5:Gradually Appeared] 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] [5:Pressure Ulcer] Comorbid History: [1:Anemia, Congestive Heart Anemia,  Congestive Heart Anemia, Congestive Heart Failure, Coronary Artery Failure, Coronary Artery Failure, Coronary Artery Disease, Hypertension, Peripheral Venous Disease, Neuropathy] [4:Disease,  Hypertension, Peripheral Venous Disease, Neuropathy] [5:Disease, Hypertension, Peripheral Venous Disease, Neuropathy] Date Acquired: [1:02/08/2017] [4:01/18/2017] [5:02/08/2017] Weeks of Treatment: [1:6] [4:6] [5:3] Wound Status: [1:Open] [4:Open] [5:Open] Measurements L x W x D 2x2x0.1 [4:0.5x0.8x0.1] [5:0.9x0.8x0.2] (cm) Area (cm) : [1:3.142] [4:0.314] [5:0.565] Volume (cm) : [1:0.314] [4:0.031] [5:0.113] % Reduction in Area: [1:-77.80%] [4:-11.00%] [5:33.40%] % Reduction in Volume: -77.40% [4:-10.70%] [5:-32.90%] Classification: [1:Partial Thickness] [4:Category/Stage II] [5:Category/Stage II] Exudate Amount: [1:Large] [4:Medium] [5:Large] Exudate Type: [1:Serous] [4:Serous] [5:Serous] Exudate Color: [1:amber] [4:amber] [5:amber] Wound Margin: [1:Distinct, outline attached Distinct, outline attached Distinct, outline  attached] Granulation Amount: [1:Small (1-33%)] [4:Medium (34-66%)] [5:Small (1-33%)] Granulation Quality: [1:Red] [4:Pale] [5:Red] Necrotic Amount: [1:Large (67-100%)] [4:Medium (34-66%)] [5:Large (67-100%)] Exposed Structures: [1:Fat Layer (Subcutaneous Fat Layer (Subcutaneous N/A Tissue) Exposed: Yes] [4:Tissue) Exposed: Yes] Epithelialization: [1:None] [4:Large (67-100%)] [5:None] Debridement: [5:N/A] Debridement ([id number] Debridement ([id number]) [id number]) Pre-procedure 14:24 14:23 N/A Verification/Time Out Taken: Pain Control: Lidocaine 4% Topical Lidocaine 4% Topical N/A Solution Solution Tissue Debrided: Cartilage, Fibrin/Slough, Cartilage, Fibrin/Slough, N/A Fat, Subcutaneous Fat, Subcutaneous Level: Skin/Subcutaneous Skin/Subcutaneous N/A Tissue Tissue Debridement Area (sq 4 0.4 N/A cm): Instrument: Curette Curette N/A Bleeding: Minimum Minimum N/A Hemostasis Achieved: Pressure Pressure N/A Procedural Pain: 0 0 N/A Post Procedural Pain: 0 0 N/A Debridement Treatment Procedure was tolerated Procedure was tolerated N/A Response: well well Post Debridement 2x2x0.2 0.5x0.8x0.1 N/A Measurements L x W x D (cm) Post Debridement 0.628 0.031 N/A Volume: (cm) Post Debridement N/A Category/Stage II N/A Stage: Periwound Skin Texture: No Abnormalities Noted Excoriation: No No Abnormalities Noted Induration: No Callus: No Crepitus: No Rash: No Scarring: No Periwound Skin Maceration: Yes Maceration: No Maceration: Yes Moisture: Dry/Scaly: No Periwound Skin Color: Mottled: Yes Atrophie Blanche: No No Abnormalities Noted Cyanosis: No Ecchymosis: No Erythema: No Hemosiderin Staining: No Mottled: No Pallor: No Rubor: No Temperature: No Abnormality No Abnormality No Abnormality Tenderness on Yes Yes Yes Palpation: Wound Preparation: Ulcer Cleansing: Ulcer Cleansing: Ulcer Cleansing: Rinsed/Irrigated with Rinsed/Irrigated with Rinsed/Irrigated with Saline Saline  Saline Bazile, Lauran O. ([id number]) Topical Anesthetic Topical Anesthetic Topical Anesthetic Applied: Other: lidocaine Applied: Other: lidocaine Applied: Other: lidocaine 4% 4% 4% Procedures Performed: Debridement Debridement N/A Treatment Notes Wound #1 (Left, Dorsal Foot) 1. Cleansed with: Clean wound with Normal Saline 4. Dressing Applied: Santyl Ointment 5. Secondary Dressing Applied Dry Gauze Kerlix/Conform 7. Secured with Tape Wound #4 (Left Gluteus) 1. Cleansed with: Clean wound with Normal Saline 4. Dressing Applied: Aquacel Ag 5. Secondary Dressing Applied Bordered Foam Dressing Wound #5 (Right, Dorsal Toe Second) 1. Cleansed with: Clean wound with Normal Saline 4. Dressing Applied: Santyl Ointment 5. Secondary Dressing Applied Dry Gauze Kerlix/Conform 7. Secured with Recruitment consultant) Signed: 03/25/2017 2:38:53 PM By: Christin Fudge MD, FACS Entered By: Christin Fudge on 03/25/2017 14:38:53 [name] [name] ([id number]) -------------------------------------------------------------------------------- [place] Details Patient Name: [name] [name], [name] [name] Date of Service: 03/25/2017 2:00 PM Medical Record Number: [id number] Patient Account Number: [id number] Date of Birth/Sex: September 08, 1923 (83 y.o. Female) Treating RN: Afful, RN, BSN, [place] Primary Care Keishawn Rajewski: Celedonio Miyamoto Other Clinician: Referring Kwali Wrinkle: Celedonio Miyamoto Treating Constancia Geeting/Extender: Frann Rider in Treatment: 6 Active Inactive ` Abuse / Safety / Falls / Self Care Management Nursing Diagnoses: Potential for falls Goals: Patient will remain injury free Date Initiated: 02/11/2017 Target Resolution Date: 05/22/2017 Goal Status: Active Interventions: Assess fall risk on admission and as needed Assess self care needs on admission and  as needed Notes: ` Nutrition Nursing Diagnoses: Imbalanced nutrition Potential for alteratiion in Nutrition/Potential for  imbalanced nutrition Goals: Patient/caregiver agrees to and verbalizes understanding of need to use nutritional supplements and/or vitamins as prescribed Date Initiated: 02/11/2017 Target Resolution Date: 05/22/2017 Goal Status: Active Interventions: Assess patient nutrition upon admission and as needed per policy Notes: ` Orientation to the [place], [place]. ([id number]) Nursing Diagnoses: Knowledge deficit related to the wound healing center program Goals: Patient/caregiver will verbalize understanding of the [facility] Program Date Initiated: 02/11/2017 Target Resolution Date: 02/20/2017 Goal Status: Active Interventions: Provide education on orientation to the wound center Notes: ` Pain, Acute or Chronic Nursing Diagnoses: Pain, acute or chronic: actual or potential Potential alteration in comfort, pain Goals: Patient/caregiver will verbalize adequate pain control between visits Date Initiated: 02/11/2017 Target Resolution Date: 05/22/2017 Goal Status: Active Interventions: Assess comfort goal upon admission Complete pain assessment as per visit requirements Notes: ` Pressure Nursing Diagnoses: Knowledge deficit related to causes and risk factors for pressure ulcer development Knowledge deficit related to management of pressures ulcers Potential for impaired tissue integrity related to pressure, friction, moisture, and shear Goals: Patient/caregiver will verbalize risk factors for pressure ulcer development Date Initiated: 02/11/2017 Target Resolution Date: 05/22/2017 Goal Status: Active Interventions: Assess: immobility, friction, shearing, incontinence upon admission and as needed [name] [name], [name] [name] ([id number]) Notes: ` Wound/Skin Impairment Nursing Diagnoses: Impaired tissue integrity Knowledge deficit related to ulceration/compromised skin integrity Goals: Ulcer/skin breakdown will have a volume reduction of 80% by week 12 Date  Initiated: 02/11/2017 Target Resolution Date: 05/22/2017 Goal Status: Active Interventions: Assess patient/caregiver ability to perform ulcer/skin care regimen upon admission and as needed Assess ulceration(s) every visit Notes: Electronic Signature(s) Signed: 03/25/2017 4:19:01 PM By: Regan Lemming BSN, RN Entered By: Regan Lemming on 03/25/2017 14:25:58 [name] [name] ([id number]) -------------------------------------------------------------------------------- Pain Assessment Details Patient Name: [name] [name] Date of Service: 03/25/2017 2:00 PM Medical Record Number: [id number] Patient Account Number: [id number] Date of Birth/Sex: 1923-08-17 (83 y.o. Female) Treating RN: Baruch Gouty, RN, BSN, Velva Harman Primary Care Jabbar Palmero: Celedonio Miyamoto Other Clinician: Referring Zhara Gieske: Celedonio Miyamoto Treating Roman Dubuc/Extender: Frann Rider in Treatment: 6 Active Problems Location of Pain Severity and Description of Pain Patient Has Paino No Site Locations With Dressing Change: No Pain Management and Medication Current Pain Management: Electronic Signature(s) Signed: 03/25/2017 4:19:01 PM By: Regan Lemming BSN, RN Entered By: Regan Lemming on 03/25/2017 14:11:06 [name] [name] ([id number]) -------------------------------------------------------------------------------- Patient/Caregiver Education Details Patient Name: [name] [name] Date of Service: 03/25/2017 2:00 PM Medical Record Number: [id number] Patient Account Number: [id number] Date of Birth/Gender: October 27, 1922 (83 y.o. Female) Treating RN: Afful, RN, BSN, Velva Harman Primary Care Physician: Celedonio Miyamoto Other Clinician: Referring Physician: Celedonio Miyamoto Treating Physician/Extender: Frann Rider in Treatment: 6 Education Assessment Education Provided To: Patient and Caregiver Education Topics Provided Welcome To [facility]: Methods: Explain/Verbal Responses: State content correctly Electronic Signature(s) Signed: 03/25/2017  4:19:01 PM By: Regan Lemming BSN, RN Entered By: Regan Lemming on 03/25/2017 14:35:38 [name] [name] ([id number]) -------------------------------------------------------------------------------- Wound Assessment Details Patient Name: [name] [name] Date of Service: 03/25/2017 2:00 PM Medical Record Number: [id number] Patient Account Number: [id number] Date of Birth/Sex: 1923-08-29 (83 y.o. Female) Treating RN: Baruch Gouty, RN, BSN, Velva Harman Primary Care Rickie Gutierres: Celedonio Miyamoto Other Clinician: Referring Addis Tuohy: Celedonio Miyamoto Treating Natayah Warmack/Extender: Frann Rider in Treatment: 6 Wound Status Wound Number: 1 Primary Arterial Insufficiency Ulcer Etiology: Wound Location: Left Foot - Dorsal Wound Open Wounding Event: Gradually Appeared Status: Date Acquired: 02/08/2017  Comorbid Anemia, Congestive Heart Failure, Weeks Of Treatment: 6 History: Coronary Artery Disease, Hypertension, Clustered Wound: No Peripheral Venous Disease, Neuropathy Photos Photo Uploaded By: Regan Lemming on 03/25/2017 14:52:08 Wound Measurements Length: (cm) 2 % Reduction i Width: (cm) 2 % Reduction i Depth: (cm) 0.1 Epithelializa Area: (cm) 3.142 Tunneling: Volume: (cm) 0.314 Undermining: n Area: -77.8% n Volume: -77.4% tion: None No No Wound Description Classification: Partial Thickness Foul Odor Aft Wound Margin: Distinct, outline attached Slough/Fibrin Exudate Amount: Large Exudate Type: Serous [name] [name], [name] [name] ([id number]) er Cleansing: No o Yes Exudate Color: amber Wound Bed Granulation Amount: Small (1-33%) Exposed Structure Granulation Quality: Red Fat Layer (Subcutaneous Tissue) Exposed: Yes Necrotic Amount: Large (67-100%) Necrotic Quality: Adherent Slough Periwound Skin Texture Texture Color No Abnormalities Noted: No No Abnormalities Noted: No Mottled: Yes Moisture No Abnormalities Noted: No Temperature / Pain Maceration: Yes Temperature: No Abnormality Tenderness on  Palpation: Yes Wound Preparation Ulcer Cleansing: Rinsed/Irrigated with Saline Topical Anesthetic Applied: Other: lidocaine 4%, Treatment Notes Wound #1 (Left, Dorsal Foot) 1. Cleansed with: Clean wound with Normal Saline 4. Dressing Applied: Santyl Ointment 5. Secondary Dressing Applied Dry Gauze Kerlix/Conform 7. Secured with Recruitment consultant) Signed: 03/25/2017 4:19:01 PM By: Regan Lemming BSN, RN Entered By: Regan Lemming on 03/25/2017 14:18:49 [name] [name] ([id number]) -------------------------------------------------------------------------------- Wound Assessment Details Patient Name: [name] [name], MCCUISTION. Date of Service: 03/25/2017 2:00 PM Medical Record Number: [id number] Patient Account Number: [id number] Date of Birth/Sex: 03/06/1923 (83 y.o. Female) Treating RN: Afful, RN, BSN, [place] Primary Care Maeby Vankleeck: Celedonio Miyamoto Other Clinician: Referring Sinthia Karabin: Celedonio Miyamoto Treating Makeisha Jentsch/Extender: Frann Rider in Treatment: 6 Wound Status Wound Number: 4 Primary Pressure Ulcer Etiology: Wound Location: Right Coccyx Wound Open Wounding Event: Pressure Injury Status: Date Acquired: 01/18/2017 Comorbid Anemia, Congestive Heart Failure, Weeks Of Treatment: 6 History: Coronary Artery Disease, Hypertension, Clustered Wound: No Peripheral Venous Disease, Neuropathy Photos Photo Uploaded By: Regan Lemming on 03/25/2017 14:52:08 Wound Measurements Length: (cm) 0.5 Width: (cm) 0.8 Depth: (cm) 0.1 Area: (cm) 0.314 Volume: (cm) 0.031 % Reduction in Area: -11% % Reduction in Volume: -10.7% Epithelialization: Large (67-100%) Tunneling: No Undermining: No Wound Description Classification: Category/Stage II Wound Margin: Distinct, outline attached Exudate Amount: Medium Exudate Type: Serous [name] [name], [name] [name] ([id number]) Foul Odor After Cleansing: No Slough/Fibrino Yes Exudate Color: amber Wound Bed Granulation Amount: Medium (34-66%) Exposed  Structure Granulation Quality: Pale Fat Layer (Subcutaneous Tissue) Exposed: Yes Necrotic Amount: Medium (34-66%) Necrotic Quality: Adherent Slough Periwound Skin Texture Texture Color No Abnormalities Noted: No No Abnormalities Noted: No Callus: No Atrophie Blanche: No Crepitus: No Cyanosis: No Excoriation: No Ecchymosis: No Induration: No Erythema: No Rash: No Hemosiderin Staining: No Scarring: No Mottled: No Pallor: No Moisture Rubor: No No Abnormalities Noted: No Dry / Scaly: No Temperature / Pain Maceration: No Temperature: No Abnormality Tenderness on Palpation: Yes Wound Preparation Ulcer Cleansing: Rinsed/Irrigated with Saline Topical Anesthetic Applied: Other: lidocaine 4%, Treatment Notes Wound #4 (Left Gluteus) 1. Cleansed with: Clean wound with Normal Saline 4. Dressing Applied: Aquacel Ag 5. Secondary Dressing Applied Bordered Foam Dressing Electronic Signature(s) Signed: 03/25/2017 4:19:01 PM By: Regan Lemming BSN, RN Entered By: Regan Lemming on 03/25/2017 [place], [place]. ([id number]) -------------------------------------------------------------------------------- Wound Assessment Details Patient Name: [name] [name] Date of Service: 03/25/2017 2:00 PM Medical Record Number: [id number] Patient Account Number: [id number] Date of Birth/Sex: 1923/06/11 (83 y.o. Female) Treating RN: Afful, RN, BSN, [employer] Primary Care Sabrin Dunlevy: Celedonio Miyamoto Other Clinician: Referring Harue Pribble: Celedonio Miyamoto Treating Tanja Gift/Extender: Frann Rider in Treatment: 6  Wound Status Wound Number: 5 Primary Pressure Ulcer Etiology: Wound Location: Right Toe Second - Dorsal Wound Open Wounding Event: Gradually Appeared Status: Date Acquired: 02/08/2017 Comorbid Anemia, Congestive Heart Failure, Weeks Of Treatment: 3 History: Coronary Artery Disease, Hypertension, Clustered Wound: No Peripheral Venous Disease, Neuropathy Photos Photo Uploaded By: Regan Lemming  on 03/25/2017 14:52:41 Wound Measurements Length: (cm) 0.9 Width: (cm) 0.8 Depth: (cm) 0.2 Area: (cm) 0.565 Volume: (cm) 0.113 % Reduction in Area: 33.4% % Reduction in Volume: -32.9% Epithelialization: None Tunneling: No Undermining: No Wound Description Classification: Category/Stage II Wound Margin: Distinct, outline attached Exudate Amount: Large Exudate Type: Serous [name] [name], [name] [name] ([id number]) Foul Odor After Cleansing: No Slough/Fibrino Yes Exudate Color: amber Wound Bed Granulation Amount: Small (1-33%) Granulation Quality: Red Necrotic Amount: Large (67-100%) Necrotic Quality: Adherent Slough Periwound Skin Texture Texture Color No Abnormalities Noted: No No Abnormalities Noted: No Moisture Temperature / Pain No Abnormalities Noted: No Temperature: No Abnormality Maceration: Yes Tenderness on Palpation: Yes Wound Preparation Ulcer Cleansing: Rinsed/Irrigated with Saline Topical Anesthetic Applied: Other: lidocaine 4%, Treatment Notes Wound #5 (Right, Dorsal Toe Second) 1. Cleansed with: Clean wound with Normal Saline 4. Dressing Applied: Santyl Ointment 5. Secondary Dressing Applied Dry Gauze Kerlix/Conform 7. Secured with Recruitment consultant) Signed: 03/25/2017 4:19:01 PM By: Regan Lemming BSN, RN Entered By: Regan Lemming on 03/25/2017 [place], [place]. ([id number]) -------------------------------------------------------------------------------- Vitals Details Patient Name: [name] [name] Date of Service: 03/25/2017 2:00 PM Medical Record Number: [id number] Patient Account Number: [id number] Date of Birth/Sex: Mar 19, 1923 (83 y.o. Female) Treating RN: Afful, RN, BSN, [place] Primary Care Malva Diesing: Celedonio Miyamoto Other Clinician: Referring Racine Erby: Celedonio Miyamoto Treating Talon Witting/Extender: Frann Rider in Treatment: 6 Vital Signs Time Taken: 14:11 Temperature (F): 97.7 Height (in): 66 Pulse (bpm): 72 Weight (lbs):  144 Respiratory Rate (breaths/min): 17 Body Mass Index (BMI): 23.2 Blood Pressure (mmHg): 105/48 Reference Range: 80 - 120 mg / dl Electronic Signature(s) Signed: 03/25/2017 4:19:01 PM By: Regan Lemming BSN, RN Entered By: Regan Lemming on 03/25/2017 14:11:31

## 2017-03-26 NOTE — Progress Notes (Addendum)
[name] [name], [name] [name] ([id number]) Visit Report for 03/25/2017 Chief Complaint Document Details Patient Name: [name] [name], [name] [name]. Date of Service: 03/25/2017 2:00 PM Medical Record Number: [id number] Patient Account Number: [id number] Date of Birth/Sex: 09-04-1923 (83 y.o. Female) Treating RN: Afful, RN, BSN, Velva Harman Primary Care Provider: Celedonio Miyamoto Other Clinician: Referring Provider: Celedonio Miyamoto Treating Provider/Extender: Frann Rider in Treatment: 6 Information Obtained from: Patient Chief Complaint Patient is at the clinic for treatment of an open pressure ulcer to her left gluteal area and other wounds on her left lower leg and foot, for 3 weeks now Electronic Signature(s) Signed: 03/25/2017 2:39:26 PM By: Christin Fudge MD, FACS Entered By: Christin Fudge on 03/25/2017 14:39:26 [name] [name] ([id number]) -------------------------------------------------------------------------------- Debridement Details Patient Name: [name] [name] Date of Service: 03/25/2017 2:00 PM Medical Record Number: [id number] Patient Account Number: [id number] Date of Birth/Sex: Nov 24, 1922 (83 y.o. Female) Treating RN: Afful, RN, BSN, [place] Primary Care Provider: Celedonio Miyamoto Other Clinician: Referring Provider: Celedonio Miyamoto Treating Provider/Extender: Frann Rider in Treatment: 6 Debridement Performed for Wound #4 Right Coccyx Assessment: Performed By: Physician Christin Fudge, MD Debridement: Debridement Pre-procedure Verification/Time Out Yes - 14:23 Taken: Start Time: 14:23 Pain Control: Lidocaine 4% Topical Solution Level: Skin/Subcutaneous Tissue Total Area Debrided (L x 0.5 (cm) x 0.8 (cm) = 0.4 (cm) W): Tissue and other Non-Viable, Fat, Fibrin/Slough, Subcutaneous material debrided: Instrument: Curette Bleeding: Minimum Hemostasis Achieved: Pressure End Time: 14:24 Procedural Pain: 0 Post Procedural Pain: 0 Response to Treatment: Procedure was tolerated well Post Debridement  Measurements of Total Wound Length: (cm) 0.5 Stage: Category/Stage II Width: (cm) 0.8 Depth: (cm) 0.1 Volume: (cm) 0.031 Character of Wound/Ulcer Post Stable Debridement: Post Procedure Diagnosis Same as Pre-procedure Electronic Signature(s) Signed: 04/14/2017 12:50:41 PM By: Regan Lemming BSN, RN Signed: 04/15/2017 7:57:16 AM By: Christin Fudge MD, FACS Previous Signature: 03/25/2017 2:39:15 PM Version By: Christin Fudge MD, FACS Previous Signature: 03/25/2017 4:19:01 PM Version By: Regan Lemming BSN, RN Entered By: Regan Lemming on 04/14/2017 12:50:41 [name] [name], [name] [name] ([id number] [name], [name] [name] ([id number]) -------------------------------------------------------------------------------- Debridement Details Patient Name: [name] [name], [name] [name]. Date of Service: 03/25/2017 2:00 PM Medical Record Number: [id number] Patient Account Number: [id number] Date of Birth/Sex: April 13, 1923 (83 y.o. Female) Treating RN: Afful, RN, BSN, [place] Primary Care Provider: Celedonio Miyamoto Other Clinician: Referring Provider: Celedonio Miyamoto Treating Provider/Extender: Frann Rider in Treatment: 6 Debridement Performed for Wound #1 Left,Dorsal Foot Assessment: Performed By: Physician Christin Fudge, MD Debridement: Debridement Severity of Tissue Pre Fat layer exposed Debridement: Pre-procedure Verification/Time Out Yes - 14:24 Taken: Start Time: 14:24 Pain Control: Lidocaine 4% Topical Solution Level: Skin/Subcutaneous Tissue Total Area Debrided (L x 2 (cm) x 2 (cm) = 4 (cm) W): Tissue and other Non-Viable, Fat, Fibrin/Slough, Subcutaneous material debrided: Instrument: Curette Bleeding: Minimum Hemostasis Achieved: Pressure End Time: 14:25 Procedural Pain: 0 Post Procedural Pain: 0 Response to Treatment: Procedure was tolerated well Post Debridement Measurements of Total Wound Length: (cm) 2 Width: (cm) 2 Depth: (cm) 0.2 Volume: (cm) 0.628 Character of Wound/Ulcer  Post Stable Debridement: Severity of Tissue Post Debridement: Fat layer exposed Post Procedure Diagnosis Same as Pre-procedure Electronic Signature(s) Signed: 04/14/2017 12:50:55 PM By: Regan Lemming BSN, RN Signed: 04/15/2017 7:57:16 AM By: Christin Fudge MD, FACS [name] [name], [name] [name] ([id number]) Previous Signature: 03/25/2017 2:39:03 PM Version By: Christin Fudge MD, FACS Previous Signature: 03/25/2017 4:19:01 PM Version By: Regan Lemming BSN, RN Entered By: Regan Lemming on 04/14/2017 12:50:54 [name] [name] ([id number]) -------------------------------------------------------------------------------- HPI Details Patient Name: [name] [name], [name] [name]. Date of Service:  03/25/2017 2:00 PM Medical Record Number: [id number] Patient Account Number: [id number] Date of Birth/Sex: Nov 16, 1922 (83 y.o. Female) Treating RN: Baruch Gouty, RN, BSN, Velva Harman Primary Care Provider: Celedonio Miyamoto Other Clinician: Referring Provider: Celedonio Miyamoto Treating Provider/Extender: Frann Rider in Treatment: 6 History of Present Illness Location: left gluteal area, left foot, left lower extremity Quality: Patient reports experiencing a sharp pain to affected area(s). Severity: Patient states wound are getting worse. Duration: Patient has had the wound for < 3 weeks prior to presenting for treatment Timing: Pain in wound is Intermittent (comes and goes Context: The wound appeared gradually over time Modifying Factors: Other treatment(s) tried include:local care with Unna boots Associated Signs and Symptoms: Patient reports having increase swelling. HPI Description: 83 year old patient referred to as by her nurse sent [place] assisted [facility] for ulcers both legs,decubitus ulcer on the sacrum which she's had for about 3 weeks. The daughter is at the bedside does not know how these occurred and there was no fall or trauma Her past medical history is suggestive of chronic diastolic heart failure, hypertension, peripheral  arterial disease, chronic venous insufficiency, generalized anxiety disorder, and has never been a smoker. She is also status post appendectomy, cholecystectomy, hysterectomy and left hip replacement. She is also had a fracture of her left humerus in November 2017 and has been treated nonsurgically with a Sarmiento brace. 03/01/2017 -- since she was seen here the last time she has been admitted to the hospital between May 3 and 02/21/2017 with COPD exacerbation and pressure injuries to the skin. she was treated with doxycycline as she had an MRSA positive sputum but had no pneumonia. She was also diuresed with IV Lasix and this helped his CHF. 03/08/2017 -- she has improved markedly and her general health and overall is looking more positive Electronic Signature(s) Signed: 03/25/2017 2:39:38 PM By: Christin Fudge MD, FACS Entered By: Christin Fudge on 03/25/2017 14:39:38 [name] [name] ([id number]) -------------------------------------------------------------------------------- Physical Exam Details Patient Name: [name] [name] Date of Service: 03/25/2017 2:00 PM Medical Record Number: [id number] Patient Account Number: [id number] Date of Birth/Sex: 1923/07/15 (83 y.o. Female) Treating RN: Baruch Gouty, RN, BSN, [place] Primary Care Provider: Celedonio Miyamoto Other Clinician: Referring Provider: Celedonio Miyamoto Treating Provider/Extender: Frann Rider in Treatment: 6 Constitutional . Pulse regular. Respirations normal and unlabored. Afebrile. . Eyes Nonicteric. Reactive to light. Ears, Nose, Mouth, and Throat Lips, teeth, and gums WNL.Marland Kitchen Moist mucosa without lesions. Neck supple and nontender. No palpable supraclavicular or cervical adenopathy. Normal sized without goiter. Respiratory WNL. No retractions.. Cardiovascular Pedal Pulses WNL. No clubbing, cyanosis or edema. Lymphatic No adneopathy. No adenopathy. No adenopathy. Musculoskeletal Adexa without tenderness or enlargement.. Digits and  nails w/o clubbing, cyanosis, infection, petechiae, ischemia, or inflammatory conditions.. Integumentary (Hair, Skin) No suspicious lesions. No crepitus or fluctuance. No peri-wound warmth or erythema. No masses.Marland Kitchen Psychiatric Judgement and insight Intact.. No evidence of depression, anxiety, or agitation.. Notes sharp debridement is done to the area to the right gluteal region which had some subcutaneous debris and minimal bleeding controlled with pressure. The wound on the dorsum of her left foot also required sharp debridement with a #3 curet. The right toe is looking better today and did not need sharp debridement. Electronic Signature(s) Signed: 03/25/2017 2:40:26 PM By: Christin Fudge MD, FACS Entered By: Christin Fudge on 03/25/2017 14:40:26 [name] [name] ([id number]) -------------------------------------------------------------------------------- Physician Orders Details Patient Name: [name] [name], [name] [name] Date of Service: 03/25/2017 2:00 PM Medical Record Number: [id number] Patient Account Number: [id number] Date of Birth/Sex: 05-28-1923 (83  y.o. Female) Treating RN: Baruch Gouty, RN, BSN, Velva Harman Primary Care Provider: Celedonio Miyamoto Other Clinician: Referring Provider: Celedonio Miyamoto Treating Provider/Extender: Frann Rider in Treatment: 6 Verbal / Phone Orders: No Diagnosis Coding Wound Cleansing Wound #1 Left,Dorsal Foot o Clean wound with Normal Saline. o Cleanse wound with mild soap and water Wound #4 Left Gluteus o Clean wound with Normal Saline. o Cleanse wound with mild soap and water Wound #5 Right,Dorsal Toe Second o Clean wound with Normal Saline. o Cleanse wound with mild soap and water Anesthetic Wound #1 Left,Dorsal Foot o Topical Lidocaine 4% cream applied to wound bed prior to debridement - for clinic use Wound #4 Left Gluteus o Topical Lidocaine 4% cream applied to wound bed prior to debridement - for clinic use Wound #5 Right,Dorsal Toe Second o  Topical Lidocaine 4% cream applied to wound bed prior to debridement - for clinic use Skin Barriers/Peri-Wound Care Wound #4 Left Gluteus o Skin Prep Primary Wound Dressing Wound #1 Left,Dorsal Foot o Santyl Ointment Wound #4 Left Gluteus o Aquacel Ag - or equivalent to Wound #5 Right,Dorsal Toe Second [address] [place], [place] ([id number]) Secondary Dressing Wound #1 Left,Dorsal Foot o ABD pad o Dry Gauze o Gauze and Kerlix/Conform Wound #4 Left Gluteus o Boardered Foam Dressing Wound #5 Right,Dorsal Toe Second o Dry Gauze o Conform/Kerlix Dressing Change Frequency Wound #1 Left,Dorsal Foot o Change dressing every other day. Wound #4 Left Gluteus o Change dressing every other day. Wound #5 Right,Dorsal Toe Second o Change dressing every other day. Follow-up Appointments Wound #1 Left,Dorsal Foot o Return Appointment in 1 week. Wound #4 Left Gluteus o Return Appointment in 1 week. Wound #5 Right,Dorsal Toe Second o Return Appointment in 1 week. Edema Control Wound #1 Left,Dorsal Foot o Elevate legs to the level of the heart and pump ankles as often as possible Wound #5 Right,Dorsal Toe Second o Elevate legs to the level of the heart and pump ankles as often as possible Off-Loading Wound #1 Left,Dorsal Foot o Turn and reposition every 2 hours Wound #4 Left Gluteus o Turn and reposition every 2 hours [name] [name], [name] [name]. ([id number]) Wound #5 Right,Dorsal Toe Second o Turn and reposition every 2 hours Additional Orders / Instructions Wound #1 Left,Dorsal Foot o Increase protein intake. Wound #4 Left Gluteus o Increase protein intake. Wound #5 Right,Dorsal Toe Second o Increase protein intake. Home Health Wound #1 [place] [facility] - [facility] o Home Health Nurse may visit PRN to address patientos wound care needs. o FACE TO FACE ENCOUNTER: MEDICARE and MEDICAID PATIENTS: I  certify that this patient is under my care and that I had a face-to-face encounter that meets the physician face-to-face encounter requirements with this patient on this date. The encounter with the patient was in whole or in part for the following MEDICAL CONDITION: (primary reason for [place]) MEDICAL NECESSITY: I certify, that based on my findings, NURSING services are a medically necessary home health service. HOME BOUND STATUS: I certify that my clinical findings support that this patient is homebound (i.e., Due to illness or injury, pt requires aid of supportive devices such as crutches, cane, wheelchairs, walkers, the use of special transportation or the assistance of another person to leave their place of residence. There is a normal inability to leave the home and doing so requires considerable and taxing effort. Other absences are for medical reasons / religious services and are infrequent or of short duration when for other reasons).   o If current dressing causes regression in wound condition, may D/C ordered dressing product/s and apply Normal Saline Moist Dressing daily until next [place] / Other MD appointment. [place] of regression in wound condition at [phone]. o Please direct any NON-WOUND related issues/requests for orders to patient's Primary Care Physician Wound #4 Left [place] Nurse may visit PRN to address patientos wound care needs. o FACE TO FACE ENCOUNTER: MEDICARE and MEDICAID PATIENTS: I certify that this patient is under my care and that I had a face-to-face encounter that meets the physician face-to-face encounter requirements with this patient on this date. The encounter with the patient was in whole or in part for the following MEDICAL CONDITION: (primary reason for [place]) MEDICAL NECESSITY: I certify, that based on my findings, NURSING  services are a medically necessary home health service. HOME BOUND STATUS: I certify that my clinical findings support that this patient is homebound (i.e., Due to illness or injury, pt requires aid of supportive devices such as crutches, cane, wheelchairs, walkers, the use of special transportation or the assistance of another person to leave their place of residence. There is a normal inability to leave the home and doing so requires considerable and taxing effort. TNIYA, BOWDITCH ([id number]) absences are for medical reasons / religious services and are infrequent or of short duration when for other reasons). o If current dressing causes regression in wound condition, may D/C ordered dressing product/s and apply Normal Saline Moist Dressing daily until next [place] / Other MD appointment. [place] of regression in wound condition at [phone]. o Please direct any NON-WOUND related issues/requests for orders to patient's Primary Care Physician Wound #5 Right,Dorsal Toe [place] Nurse may visit PRN to address patientos wound care needs. o FACE TO FACE ENCOUNTER: MEDICARE and MEDICAID PATIENTS: I certify that this patient is under my care and that I had a face-to-face encounter that meets the physician face-to-face encounter requirements with this patient on this date. The encounter with the patient was in whole or in part for the following MEDICAL CONDITION: (primary reason for [place]) MEDICAL NECESSITY: I certify, that based on my findings, NURSING services are a medically necessary home health service. HOME BOUND STATUS: I certify that my clinical findings support that this patient is homebound (i.e., Due to illness or injury, pt requires aid of supportive devices such as crutches, cane, wheelchairs, walkers, the use of special transportation or the assistance of another  person to leave their place of residence. There is a normal inability to leave the home and doing so requires considerable and taxing effort. Other absences are for medical reasons / religious services and are infrequent or of short duration when for other reasons). o If current dressing causes regression in wound condition, may D/C ordered dressing product/s and apply Normal Saline Moist Dressing daily until next [place] / Other MD appointment. [place] of regression in wound condition at [phone]. o Please direct any NON-WOUND related issues/requests for orders to patient's Primary Care Physician Medications-please add to medication list. Wound #1 Left,Dorsal Foot o Santyl Enzymatic Ointment o Other: - Vitamin C, Zinc, MVI Wound #4 Left Gluteus o Other: - Vitamin C, Zinc, MVI Wound #5 Right,Dorsal Toe Second o Santyl Enzymatic Ointment o Other: - Vitamin C, Zinc, MVI Electronic Signature(s) Signed: 03/25/2017 3:31:51  PM By: Christin Fudge MD, FACS Signed: 03/25/2017 4:19:01 PM By: Regan Lemming BSN, RN Entered By: Regan Lemming on 03/25/2017 14:28:19 [name] [name] ([id number]) -------------------------------------------------------------------------------- Problem List Details Patient Name: AFTYN, NOTT Date of Service: 03/25/2017 2:00 PM Medical Record Number: [id number] Patient Account Number: [id number] Date of Birth/Sex: 12-07-1922 (83 y.o. Female) Treating RN: Afful, RN, BSN, [place] Primary Care Provider: Celedonio Miyamoto Other Clinician: Referring Provider: Celedonio Miyamoto Treating Provider/Extender: Frann Rider in Treatment: 6 Active Problems ICD-10 Encounter Code Description Active Date Diagnosis [phone] Pressure ulcer of left buttock, stage 2 02/11/2017 Yes L97.222 Non-pressure chronic ulcer of left calf with fat layer 02/11/2017 Yes exposed L97.522 Non-pressure chronic ulcer of other part of left foot with fat 02/11/2017  Yes layer exposed I73.9 Peripheral vascular disease, unspecified 02/11/2017 Yes L97.513 Non-pressure chronic ulcer of other part of right foot with 03/18/2017 Yes necrosis of muscle Inactive Problems Resolved Problems Electronic Signature(s) Signed: 03/25/2017 2:38:47 PM By: Christin Fudge MD, FACS Entered By: Christin Fudge on 03/25/2017 14:38:46 [name] [name] ([id number]) -------------------------------------------------------------------------------- Progress Note Details Patient Name: [name] [name] Date of Service: 03/25/2017 2:00 PM Medical Record Number: [id number] Patient Account Number: [id number] Date of Birth/Sex: 05-May-1923 (83 y.o. Female) Treating RN: Afful, RN, BSN, [place] Primary Care Provider: Celedonio Miyamoto Other Clinician: Referring Provider: Celedonio Miyamoto Treating Provider/Extender: Frann Rider in Treatment: 6 Subjective Chief Complaint Information obtained from Patient Patient is at the clinic for treatment of an open pressure ulcer to her left gluteal area and other wounds on her left lower leg and foot, for 3 weeks now History of Present Illness (HPI) The following HPI elements were documented for the patient's wound: Location: left gluteal area, left foot, left lower extremity Quality: Patient reports experiencing a sharp pain to affected area(s). Severity: Patient states wound are getting worse. Duration: Patient has had the wound for < 3 weeks prior to presenting for treatment Timing: Pain in wound is Intermittent (comes and goes Context: The wound appeared gradually over time Modifying Factors: Other treatment(s) tried include:local care with Unna boots Associated Signs and Symptoms: Patient reports having increase swelling. 83 year old patient referred to as by her nurse sent [facility] assisted [facility] for ulcers both legs,decubitus ulcer on the sacrum which she's had for about 3 weeks. The daughter is at the bedside does not know how these occurred  and there was no fall or trauma Her past medical history is suggestive of chronic diastolic heart failure, hypertension, peripheral arterial disease, chronic venous insufficiency, generalized anxiety disorder, and has never been a smoker. She is also status post appendectomy, cholecystectomy, hysterectomy and left hip replacement. She is also had a fracture of her left humerus in November 2017 and has been treated nonsurgically with a Sarmiento brace. 03/01/2017 -- since she was seen here the last time she has been admitted to the hospital between May 3 and 02/21/2017 with COPD exacerbation and pressure injuries to the skin. she was treated with doxycycline as she had an MRSA positive sputum but had no pneumonia. She was also diuresed with IV Lasix and this helped his CHF. 03/08/2017 -- she has improved markedly and her general health and overall is looking more positive Objective Constitutional Yearsley, Leslie ([id number]) Pulse regular. Respirations normal and unlabored. Afebrile. Vitals Time Taken: 2:11 PM, Height: 66 in, Weight: 144 lbs, BMI: 23.2, Temperature: 97.7 F, Pulse: 72 bpm, Respiratory Rate: 17 breaths/min, Blood Pressure: 105/48 mmHg. Eyes Nonicteric. Reactive to light. Ears, Nose, Mouth, and Throat Lips, teeth, and gums  WNL.. Moist mucosa without lesions. Neck supple and nontender. No palpable supraclavicular or cervical adenopathy. Normal sized without goiter. Respiratory WNL. No retractions.. Cardiovascular Pedal Pulses WNL. No clubbing, cyanosis or edema. Lymphatic No adneopathy. No adenopathy. No adenopathy. Musculoskeletal Adexa without tenderness or enlargement.. Digits and nails w/o clubbing, cyanosis, infection, petechiae, ischemia, or inflammatory conditions.Marland Kitchen Psychiatric Judgement and insight Intact.. No evidence of depression, anxiety, or agitation.. General Notes: sharp debridement is done to the area to the right gluteal region which had  some subcutaneous debris and minimal bleeding controlled with pressure. The wound on the dorsum of her left foot also required sharp debridement with a #3 curet. The right toe is looking better today and did not need sharp debridement. Integumentary (Hair, Skin) No suspicious lesions. No crepitus or fluctuance. No peri-wound warmth or erythema. No masses.. Wound #1 status is Open. Original cause of wound was Gradually Appeared. The wound is located on the Left,Dorsal Foot. The wound measures 2cm length x 2cm width x 0.1cm depth; 3.142cm^2 area and 0.314cm^3 volume. There is Fat Layer (Subcutaneous Tissue) Exposed exposed. There is no tunneling or undermining noted. There is a large amount of serous drainage noted. The wound margin is distinct with the outline attached to the wound base. There is small (1-33%) red granulation within the wound bed. There is a large (67-100%) amount of necrotic tissue within the wound bed including Adherent Slough. The periwound skin appearance exhibited: Maceration, Mottled. Periwound temperature was noted as No Abnormality. The periwound has tenderness on palpation. Wound #4 status is Open. Original cause of wound was Pressure Injury. The wound is located on the Right Coccyx. The wound measures 0.5cm length x 0.8cm width x 0.1cm depth; 0.314cm^2 area and 0.031cm^3 Garson, Daleigh O. ([id number]) volume. There is Fat Layer (Subcutaneous Tissue) Exposed exposed. There is no tunneling or undermining noted. There is a medium amount of serous drainage noted. The wound margin is distinct with the outline attached to the wound base. There is medium (34-66%) pale granulation within the wound bed. There is a medium (34-66%) amount of necrotic tissue within the wound bed including Adherent Slough. The periwound skin appearance did not exhibit: Callus, Crepitus, Excoriation, Induration, Rash, Scarring, Dry/Scaly, Maceration, Atrophie Blanche, Cyanosis, Ecchymosis, Hemosiderin  Staining, Mottled, Pallor, Rubor, Erythema. Periwound temperature was noted as No Abnormality. The periwound has tenderness on palpation. Wound #5 status is Open. Original cause of wound was Gradually Appeared. The wound is located on the Right,Dorsal Toe Second. The wound measures 0.9cm length x 0.8cm width x 0.2cm depth; 0.565cm^2 area and 0.113cm^3 volume. There is no tunneling or undermining noted. There is a large amount of serous drainage noted. The wound margin is distinct with the outline attached to the wound base. There is small (1-33%) red granulation within the wound bed. There is a large (67-100%) amount of necrotic tissue within the wound bed including Adherent Slough. The periwound skin appearance exhibited: Maceration. Periwound temperature was noted as No Abnormality. The periwound has tenderness on palpation. 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 There was a Skin/Subcutaneous Tissue Debridement ([id number]) debridement with total area of 4 sq cm performed by Christin Fudge, MD. with the following instrument(s): Curette to remove Non-Viable tissue/material including Fat Layer (and Subcutaneous Tissue) Exposed, Fibrin/Slough, and Subcutaneous after achieving pain control using Lidocaine 4% Topical Solution. A time out was conducted at 14:24, prior to the start of the procedure. A Minimum amount of bleeding was controlled with Pressure. The procedure was  tolerated well with a pain level of 0 throughout and a pain level of 0 following the procedure. Post Debridement Measurements: 2cm length x 2cm width x 0.2cm depth; 0.628cm^3 volume. Character of Wound/Ulcer Post Debridement is stable. Severity of Tissue Post Debridement is: Fat layer exposed. AMNAH, BREUER ([id number]) Post procedure Diagnosis Wound #1: Same as Pre-Procedure Wound #4 Pre-procedure diagnosis of Wound #4 is a Pressure Ulcer located on the Right Coccyx . There was a Skin/Subcutaneous Tissue Debridement ([id number]) debridement with total area of 0.4 sq cm performed by Christin Fudge, MD. with the following instrument(s): Curette to remove Non-Viable tissue/material including Fat Layer (and Subcutaneous Tissue) Exposed, Fibrin/Slough, and Subcutaneous after achieving pain control using Lidocaine 4% Topical Solution. A time out was conducted at 14:23, prior to the start of the procedure. A Minimum amount of bleeding was controlled with Pressure. The procedure was tolerated well with a pain level of 0 throughout and a pain level of 0 following the procedure. Post Debridement Measurements: 0.5cm length x 0.8cm width x 0.1cm depth; 0.031cm^3 volume. Post debridement Stage noted as Category/Stage II. Character of Wound/Ulcer Post Debridement is stable. Post procedure Diagnosis Wound #4: Same as Pre-Procedure Plan Wound Cleansing: Wound #1 Left,Dorsal Foot: Clean wound with Normal Saline. Cleanse wound with mild soap and water Wound #4 Left Gluteus: Clean wound with Normal Saline. Cleanse wound with mild soap and water Wound #5 Right,Dorsal Toe Second: Clean wound with Normal Saline. Cleanse wound with mild soap and water Anesthetic: Wound #1 Left,Dorsal Foot: Topical Lidocaine 4% cream applied to wound bed prior to debridement - for clinic use Wound #4 Left Gluteus: Topical Lidocaine 4% cream applied to wound bed prior to debridement - for clinic use Wound #5  Right,Dorsal Toe Second: Topical Lidocaine 4% cream applied to wound bed prior to debridement - for clinic use Skin Barriers/Peri-Wound Care: Wound #4 Left Gluteus: Skin Prep Primary Wound Dressing: Wound #1 Left,Dorsal Foot: Santyl Ointment Wound #4 Left Gluteus: Aquacel Ag - or equivalent to Wound #5 Right,Dorsal Toe Second: Santyl SUZETTA, TIMKO ([id number]) Secondary Dressing: Wound #1 Left,Dorsal Foot: ABD pad Dry Gauze Gauze and Kerlix/Conform Wound #4 Left Gluteus: Boardered Foam Dressing Wound #5 Right,Dorsal Toe Second: Dry Gauze Conform/Kerlix Dressing Change Frequency: Wound #1 Left,Dorsal Foot: Change dressing every other day. Wound #4 Left Gluteus: Change dressing every other day. Wound #5 Right,Dorsal Toe Second: Change dressing every other day. Follow-up Appointments: Wound #1 Left,Dorsal Foot: Return Appointment in 1 week. Wound #4 Left Gluteus: Return Appointment in 1 week. Wound #5 Right,Dorsal Toe Second: Return Appointment in 1 week. Edema Control: Wound #1 Left,Dorsal Foot: Elevate legs to the level of the heart and pump ankles as often as possible Wound #5 Right,Dorsal Toe Second: Elevate legs to the level of the heart and pump ankles as often as possible Off-Loading: Wound #1 Left,Dorsal Foot: Turn and reposition every 2 hours Wound #4 Left Gluteus: Turn and reposition every 2 hours Wound #5 Right,Dorsal Toe Second: Turn and reposition every 2 hours Additional Orders / Instructions: Wound #1 Left,Dorsal  Foot: Increase protein intake. Wound #4 Left Gluteus: Increase protein intake. Wound #5 Right,Dorsal Toe Second: Increase protein intake. Home Health: Wound #1 Left,Dorsal Foot: [place] Nurse may visit PRN to address patient s wound care needs. FACE TO FACE ENCOUNTER: MEDICARE and MEDICAID PATIENTS: I certify that this patient is under my care and that I had a face-to-face  encounter that meets the physician face-to-face encounter requirements with this patient on this date. The encounter with the patient was in whole or in part for the following MEDICAL CONDITION: (primary reason for [place]) MEDICAL NECESSITY: I certify, KERISSA, COIA ([id number]) that based on my findings, NURSING services are a medically necessary home health service. HOME BOUND STATUS: I certify that my clinical findings support that this patient is homebound (i.e., Due to illness or injury, pt requires aid of supportive devices such as crutches, cane, wheelchairs, walkers, the use of special transportation or the assistance of another person to leave their place of residence. There is a normal inability to leave the home and doing so requires considerable and taxing effort. Other absences are for medical reasons / religious services and are infrequent or of short duration when for other reasons). If current dressing causes regression in wound condition, may D/C ordered dressing product/s and apply Normal Saline Moist Dressing daily until next [place] / Other MD appointment. [place] of regression in wound condition at [phone]. Please direct any NON-WOUND related issues/requests for orders to patient's Primary Care Physician Wound #4 Left Gluteus: [place] Nurse may visit PRN to address patient s wound care needs. FACE TO FACE ENCOUNTER: MEDICARE and MEDICAID PATIENTS: I certify that this patient is under my care and that I had a face-to-face encounter that meets the physician face-to-face encounter requirements with this patient on this date. The encounter with the patient was in whole or in part for the following MEDICAL CONDITION: (primary reason for [place]) MEDICAL NECESSITY: I certify, that based on my findings, NURSING services are a medically necessary home health service. HOME BOUND  STATUS: I certify that my clinical findings support that this patient is homebound (i.e., Due to illness or injury, pt requires aid of supportive devices such as crutches, cane, wheelchairs, walkers, the use of special transportation or the assistance of another person to leave their place of residence. There is a normal inability to leave the home and doing so requires considerable and taxing effort. Other absences are for medical reasons / religious services and are infrequent or of short duration when for other reasons). If current dressing causes regression in wound condition, may D/C ordered dressing product/s and apply Normal Saline Moist Dressing daily until next [place] / Other MD appointment. [place] of regression in wound condition at [phone]. Please direct any NON-WOUND related issues/requests for orders to patient's Primary Care Physician Wound #5 Right,Dorsal Toe Second: [place] Nurse may visit PRN to address patient s wound care needs. FACE TO FACE ENCOUNTER: MEDICARE and MEDICAID PATIENTS: I certify that this patient is under my care and that I had a face-to-face encounter that meets the physician face-to-face encounter requirements with this patient on this date. The encounter with the patient was in whole or in part for the following MEDICAL CONDITION: (primary reason for [place]) MEDICAL NECESSITY: I certify, that based on my findings, NURSING services are a  medically necessary home health service. HOME BOUND STATUS: I certify that my clinical findings support that this patient is homebound (i.e., Due to illness or injury, pt requires aid of supportive devices such as crutches, cane, wheelchairs, walkers, the use of special transportation or the assistance of another person to leave their place of residence. There is a normal inability to leave the home and doing so requires considerable  and taxing effort. Other absences are for medical reasons / religious services and are infrequent or of short duration when for other reasons). If current dressing causes regression in wound condition, may D/C ordered dressing product/s and apply Normal Saline Moist Dressing daily until next [place] / Other MD appointment. [place] of regression in wound condition at [phone]. Please direct any NON-WOUND related issues/requests for orders to patient's Primary Care Physician Medications-please add to medication list.: Wound #1 Left,Dorsal Foot: Santyl Enzymatic Ointment Other: - Vitamin C, Zinc, MVI Wound #4 Left Gluteus: Other: - Vitamin C, Zinc, MVI Wound #5 Right,Dorsal Toe SecondSHELIE, [place] ([id number]) Santyl Enzymatic Ointment Other: - Vitamin C, Zinc, MVI the improvement is now slow and after review I have recommended: 1. Silver alginate and a bordered foam to the sacral and left gluteal area 2. Santyl ointment to the dorsum of the left foot. A light Kerlix and [place] dressing to be applied 3. Santyl ointment to the right second toe 4. supportive care, with adequate protein, vitamin E, vitamin C and zinc has also been emphasized 5. offloading has been discussed a lot with her caregivers. Electronic Signature(s) Signed: 04/16/2017 8:04:04 AM By: Gretta Cool, BSN, RN, CWS, Kim RN, BSN Signed: 04/19/2017 7:58:58 AM By: Christin Fudge MD, FACS Previous Signature: 03/25/2017 2:41:26 PM Version By: Christin Fudge MD, FACS Entered By: Gretta Cool BSN, RN, CWS, Kim on 04/16/2017 08:04:04 [name] [name] ([id number]) -------------------------------------------------------------------------------- SuperBill Details Patient Name: ISHIKA, [place]. Date of Service: 03/25/2017 Medical Record Number: [id number] Patient Account Number: [id number] Date of Birth/Sex: 01-06-1923 (83 y.o. Female) Treating RN: Afful, RN, BSN, Rosa Sanchez Primary Care Provider: Celedonio Miyamoto Other  Clinician: Referring Provider: Celedonio Miyamoto Treating Provider/Extender: Frann Rider in Treatment: 6 Diagnosis Coding ICD-10 Codes Code Description [phone] Pressure ulcer of left buttock, stage 2 L97.222 Non-pressure chronic ulcer of left calf with fat layer exposed L97.522 Non-pressure chronic ulcer of other part of left foot with fat layer exposed I73.9 Peripheral vascular disease, unspecified L97.513 Non-pressure chronic ulcer of other part of right foot with necrosis of muscle Facility Procedures CPT4: Description Modifier Quantity Code [id number] 11042 - DEB SUBQ TISSUE 20 SQ CM/< 1 ICD-10 Description Diagnosis L89.322 Pressure ulcer of left buttock, stage 2 L97.522 Non-pressure chronic ulcer of other part of left foot with fat layer exposed  L97.513 Non-pressure chronic ulcer of other part of right foot with necrosis of muscle I73.9 Peripheral vascular disease, unspecified Physician Procedures CPT4: Description Modifier Quantity Code [id number] 32202 - WC PHYS SUBQ TISS 20 SQ CM 1 ICD-10 Description Diagnosis L89.322 Pressure ulcer of left buttock, stage 2 L97.522 Non-pressure chronic ulcer of other part of left foot with fat layer exposed  L97.513 Non-pressure chronic ulcer of other part of right foot with necrosis of muscle I73.9 Peripheral vascular disease, unspecified Electronic Signature(s) Signed: 03/25/2017 2:41:48 PM By: Christin Fudge MD, FACS [name] [name] ([id number]) Entered By: Christin Fudge on 03/25/2017 14:41:48

## 2017-03-31 DIAGNOSIS — R411 Anterograde amnesia: Secondary | ICD-10-CM | POA: Diagnosis not present

## 2017-04-01 ENCOUNTER — Inpatient Hospital Stay
Admission: EM | Admit: 2017-04-01 | Discharge: 2017-04-06 | DRG: 291 | Disposition: A | Discharge status: Skilled Nursing Facility | Payer: Medicare Other | Attending: Internal Medicine | Admitting: Internal Medicine

## 2017-04-01 ENCOUNTER — Encounter: Payer: Medicare Other | Admitting: Surgery

## 2017-04-01 ENCOUNTER — Emergency Department: Payer: Medicare Other

## 2017-04-01 DIAGNOSIS — F411 Generalized anxiety disorder: Secondary | ICD-10-CM | POA: Diagnosis present

## 2017-04-01 DIAGNOSIS — L89153 Pressure ulcer of sacral region, stage 3: Secondary | ICD-10-CM | POA: Diagnosis present

## 2017-04-01 DIAGNOSIS — I2510 Atherosclerotic heart disease of native coronary artery without angina pectoris: Secondary | ICD-10-CM | POA: Diagnosis present

## 2017-04-01 DIAGNOSIS — N184 Chronic kidney disease, stage 4 (severe): Secondary | ICD-10-CM | POA: Diagnosis present

## 2017-04-01 DIAGNOSIS — E871 Hypo-osmolality and hyponatremia: Secondary | ICD-10-CM | POA: Diagnosis present

## 2017-04-01 DIAGNOSIS — L89892 Pressure ulcer of other site, stage 2: Secondary | ICD-10-CM | POA: Diagnosis not present

## 2017-04-01 DIAGNOSIS — I130 Hypertensive heart and chronic kidney disease with heart failure and stage 1 through stage 4 chronic kidney disease, or unspecified chronic kidney disease: Principal | ICD-10-CM | POA: Diagnosis present

## 2017-04-01 DIAGNOSIS — K219 Gastro-esophageal reflux disease without esophagitis: Secondary | ICD-10-CM | POA: Diagnosis present

## 2017-04-01 DIAGNOSIS — I10 Essential (primary) hypertension: Secondary | ICD-10-CM

## 2017-04-01 DIAGNOSIS — E785 Hyperlipidemia, unspecified: Secondary | ICD-10-CM | POA: Diagnosis present

## 2017-04-01 DIAGNOSIS — J9621 Acute and chronic respiratory failure with hypoxia: Secondary | ICD-10-CM | POA: Diagnosis present

## 2017-04-01 DIAGNOSIS — Z9981 Dependence on supplemental oxygen: Secondary | ICD-10-CM

## 2017-04-01 DIAGNOSIS — R1312 Dysphagia, oropharyngeal phase: Secondary | ICD-10-CM | POA: Diagnosis not present

## 2017-04-01 DIAGNOSIS — Z9049 Acquired absence of other specified parts of digestive tract: Secondary | ICD-10-CM | POA: Diagnosis not present

## 2017-04-01 DIAGNOSIS — J440 Chronic obstructive pulmonary disease with acute lower respiratory infection: Secondary | ICD-10-CM | POA: Diagnosis present

## 2017-04-01 DIAGNOSIS — J441 Chronic obstructive pulmonary disease with (acute) exacerbation: Secondary | ICD-10-CM | POA: Diagnosis present

## 2017-04-01 DIAGNOSIS — R6889 Other general symptoms and signs: Secondary | ICD-10-CM | POA: Diagnosis not present

## 2017-04-01 DIAGNOSIS — Z8249 Family history of ischemic heart disease and other diseases of the circulatory system: Secondary | ICD-10-CM

## 2017-04-01 DIAGNOSIS — J209 Acute bronchitis, unspecified: Secondary | ICD-10-CM | POA: Diagnosis present

## 2017-04-01 DIAGNOSIS — Z79899 Other long term (current) drug therapy: Secondary | ICD-10-CM

## 2017-04-01 DIAGNOSIS — Z7902 Long term (current) use of antithrombotics/antiplatelets: Secondary | ICD-10-CM | POA: Diagnosis not present

## 2017-04-01 DIAGNOSIS — I5020 Unspecified systolic (congestive) heart failure: Secondary | ICD-10-CM | POA: Diagnosis not present

## 2017-04-01 DIAGNOSIS — I739 Peripheral vascular disease, unspecified: Secondary | ICD-10-CM | POA: Diagnosis present

## 2017-04-01 DIAGNOSIS — E875 Hyperkalemia: Secondary | ICD-10-CM | POA: Diagnosis present

## 2017-04-01 DIAGNOSIS — Z888 Allergy status to other drugs, medicaments and biological substances status: Secondary | ICD-10-CM

## 2017-04-01 DIAGNOSIS — J189 Pneumonia, unspecified organism: Secondary | ICD-10-CM

## 2017-04-01 DIAGNOSIS — R498 Other voice and resonance disorders: Secondary | ICD-10-CM | POA: Diagnosis not present

## 2017-04-01 DIAGNOSIS — Z8673 Personal history of transient ischemic attack (TIA), and cerebral infarction without residual deficits: Secondary | ICD-10-CM | POA: Diagnosis not present

## 2017-04-01 DIAGNOSIS — R0602 Shortness of breath: Secondary | ICD-10-CM | POA: Diagnosis not present

## 2017-04-01 DIAGNOSIS — R05 Cough: Secondary | ICD-10-CM | POA: Diagnosis not present

## 2017-04-01 DIAGNOSIS — R21 Rash and other nonspecific skin eruption: Secondary | ICD-10-CM | POA: Diagnosis not present

## 2017-04-01 DIAGNOSIS — S42309D Unspecified fracture of shaft of humerus, unspecified arm, subsequent encounter for fracture with routine healing: Secondary | ICD-10-CM | POA: Diagnosis not present

## 2017-04-01 DIAGNOSIS — I160 Hypertensive urgency: Secondary | ICD-10-CM | POA: Diagnosis present

## 2017-04-01 DIAGNOSIS — F064 Anxiety disorder due to known physiological condition: Secondary | ICD-10-CM | POA: Diagnosis not present

## 2017-04-01 DIAGNOSIS — M810 Age-related osteoporosis without current pathological fracture: Secondary | ICD-10-CM | POA: Diagnosis present

## 2017-04-01 DIAGNOSIS — Z90710 Acquired absence of both cervix and uterus: Secondary | ICD-10-CM | POA: Diagnosis not present

## 2017-04-01 DIAGNOSIS — Z7982 Long term (current) use of aspirin: Secondary | ICD-10-CM

## 2017-04-01 DIAGNOSIS — L97522 Non-pressure chronic ulcer of other part of left foot with fat layer exposed: Secondary | ICD-10-CM | POA: Diagnosis not present

## 2017-04-01 DIAGNOSIS — I5033 Acute on chronic diastolic (congestive) heart failure: Secondary | ICD-10-CM | POA: Diagnosis present

## 2017-04-01 DIAGNOSIS — Z5189 Encounter for other specified aftercare: Secondary | ICD-10-CM | POA: Diagnosis not present

## 2017-04-01 DIAGNOSIS — Z885 Allergy status to narcotic agent status: Secondary | ICD-10-CM

## 2017-04-01 DIAGNOSIS — I70245 Atherosclerosis of native arteries of left leg with ulceration of other part of foot: Secondary | ICD-10-CM | POA: Diagnosis not present

## 2017-04-01 DIAGNOSIS — M6281 Muscle weakness (generalized): Secondary | ICD-10-CM | POA: Diagnosis not present

## 2017-04-01 DIAGNOSIS — Z9911 Dependence on respirator [ventilator] status: Secondary | ICD-10-CM | POA: Diagnosis not present

## 2017-04-01 LAB — CBC WITH DIFFERENTIAL/PLATELET
BASOS ABS: 0.1 10*3/uL (ref 0–0.1)
Basophils Relative: 0 %
EOS ABS: 0.3 10*3/uL (ref 0–0.7)
EOS PCT: 2 %
HCT: 25.5 % — ABNORMAL LOW (ref 35.0–47.0)
Hemoglobin: 8.7 g/dL — ABNORMAL LOW (ref 12.0–16.0)
LYMPHS ABS: 1.4 10*3/uL (ref 1.0–3.6)
Lymphocytes Relative: 9 %
MCH: 33.5 pg (ref 26.0–34.0)
MCHC: 34.1 g/dL (ref 32.0–36.0)
MCV: 98.4 fL (ref 80.0–100.0)
Monocytes Absolute: 1.8 10*3/uL — ABNORMAL HIGH (ref 0.2–0.9)
Monocytes Relative: 12 %
Neutro Abs: 12 10*3/uL — ABNORMAL HIGH (ref 1.4–6.5)
Neutrophils Relative %: 77 %
PLATELETS: 236 10*3/uL (ref 150–440)
RBC: 2.59 MIL/uL — ABNORMAL LOW (ref 3.80–5.20)
RDW: 14.1 % (ref 11.5–14.5)
WBC: 15.6 10*3/uL — ABNORMAL HIGH (ref 3.6–11.0)

## 2017-04-01 LAB — BLOOD GAS, ARTERIAL
ALLENS TEST (PASS/FAIL): POSITIVE — AB
Acid-Base Excess: 4.4 mmol/L — ABNORMAL HIGH (ref 0.0–2.0)
BICARBONATE: 30.2 mmol/L — AB (ref 20.0–28.0)
FIO2: 0.28
O2 SAT: 94.2 %
PATIENT TEMPERATURE: 37
PO2 ART: 73 mmHg — AB (ref 83.0–108.0)
pCO2 arterial: 51 mmHg — ABNORMAL HIGH (ref 32.0–48.0)
pH, Arterial: 7.38 (ref 7.350–7.450)

## 2017-04-01 LAB — BASIC METABOLIC PANEL
Anion gap: 8 (ref 5–15)
BUN: 51 mg/dL — AB (ref 6–20)
CALCIUM: 9.2 mg/dL (ref 8.9–10.3)
CO2: 28 mmol/L (ref 22–32)
Chloride: 95 mmol/L — ABNORMAL LOW (ref 101–111)
Creatinine, Ser: 2.1 mg/dL — ABNORMAL HIGH (ref 0.44–1.00)
GFR calc Af Amer: 22 mL/min — ABNORMAL LOW (ref >60)
GFR, EST NON AFRICAN AMERICAN: 19 mL/min — AB (ref >60)
Glucose, Bld: 166 mg/dL — ABNORMAL HIGH (ref 65–99)
Potassium: 5.4 mmol/L — ABNORMAL HIGH (ref 3.5–5.1)
SODIUM: 131 mmol/L — AB (ref 135–145)

## 2017-04-01 LAB — TROPONIN I: Troponin I: <0.03 ng/mL (ref <0.03)

## 2017-04-01 LAB — BRAIN NATRIURETIC PEPTIDE
B Natriuretic Peptide: 354 pg/mL — ABNORMAL HIGH (ref 0.0–100.0)
B Natriuretic Peptide: 455 pg/mL — ABNORMAL HIGH (ref 0.0–100.0)

## 2017-04-01 LAB — MRSA PCR SCREENING: MRSA by PCR: NEGATIVE

## 2017-04-01 MED ORDER — FAMOTIDINE 20 MG PO TABS
20.0000 mg | ORAL_TABLET | Freq: Every day | ORAL | Status: DC
  Administered 2017-04-01 – 2017-04-06 (×6): 20 mg via ORAL
  Filled 2017-04-01 (×6): qty 1

## 2017-04-01 MED ORDER — SODIUM CHLORIDE 0.9% FLUSH: 3.0000 mL | INTRAVENOUS | Status: DC | PRN

## 2017-04-01 MED ORDER — VITAMIN D 1000 UNITS PO TABS
4000.0000 [IU] | ORAL_TABLET | Freq: Every day | ORAL | Status: DC
  Administered 2017-04-02 – 2017-04-06 (×6): 4000 [IU] via ORAL
  Filled 2017-04-01 (×8): qty 4

## 2017-04-01 MED ORDER — HYDROCOD POLST-CPM POLST ER 10-8 MG/5ML PO SUER
5.0000 mL | Freq: Two times a day (BID) | ORAL | Status: DC
  Administered 2017-04-02 – 2017-04-06 (×9): 5 mL via ORAL
  Filled 2017-04-01 (×9): qty 5

## 2017-04-01 MED ORDER — RISAQUAD PO CAPS
1.0000 | ORAL_CAPSULE | Freq: Every day | ORAL | Status: DC
  Administered 2017-04-02 – 2017-04-06 (×6): 1 via ORAL
  Filled 2017-04-01 (×6): qty 1

## 2017-04-01 MED ORDER — VITAMIN B-12 1000 MCG PO TABS
1000.0000 ug | ORAL_TABLET | Freq: Every day | ORAL | Status: DC
  Administered 2017-04-02 – 2017-04-06 (×6): 1000 ug via ORAL
  Filled 2017-04-01 (×6): qty 1

## 2017-04-01 MED ORDER — ASPIRIN EC 81 MG PO TBEC
81.0000 mg | DELAYED_RELEASE_TABLET | Freq: Every day | ORAL | Status: DC
  Administered 2017-04-01 – 2017-04-06 (×6): 81 mg via ORAL
  Filled 2017-04-01 (×6): qty 1

## 2017-04-01 MED ORDER — SODIUM CHLORIDE 0.9 % IV SOLN: 250.0000 mL | INTRAVENOUS | Status: DC | PRN

## 2017-04-01 MED ORDER — METHYLPREDNISOLONE SODIUM SUCC 125 MG IJ SOLR
125.0000 mg | Freq: Once | INTRAMUSCULAR | Status: AC
  Administered 2017-04-01: 125 mg via INTRAVENOUS
  Filled 2017-04-01: qty 2

## 2017-04-01 MED ORDER — LEVOFLOXACIN 500 MG PO TABS: 500.0000 mg | ORAL_TABLET | ORAL | Status: DC

## 2017-04-01 MED ORDER — ACETAMINOPHEN 325 MG PO TABS: 650.0000 mg | ORAL_TABLET | Freq: Four times a day (QID) | ORAL | Status: DC | PRN

## 2017-04-01 MED ORDER — TIOTROPIUM BROMIDE MONOHYDRATE 18 MCG IN CAPS
18.0000 ug | ORAL_CAPSULE | Freq: Every day | RESPIRATORY_TRACT | Status: DC
  Administered 2017-04-01 – 2017-04-05 (×4): 18 ug via RESPIRATORY_TRACT
  Filled 2017-04-01: qty 5

## 2017-04-01 MED ORDER — ONDANSETRON HCL 4 MG PO TABS: 4.0000 mg | ORAL_TABLET | Freq: Three times a day (TID) | ORAL | Status: DC | PRN

## 2017-04-01 MED ORDER — IPRATROPIUM BROMIDE 0.02 % IN SOLN
0.5000 mg | Freq: Once | RESPIRATORY_TRACT | Status: AC
  Administered 2017-04-01: 0.5 mg via RESPIRATORY_TRACT
  Filled 2017-04-01: qty 2.5

## 2017-04-01 MED ORDER — DICLOFENAC EPOLAMINE 1.3 % TD PTCH
1.0000 | MEDICATED_PATCH | Freq: Two times a day (BID) | TRANSDERMAL | Status: DC
  Administered 2017-04-02: 1 via TRANSDERMAL
  Filled 2017-04-01 (×3): qty 1

## 2017-04-01 MED ORDER — ALBUTEROL SULFATE (2.5 MG/3ML) 0.083% IN NEBU
5.0000 mg | INHALATION_SOLUTION | Freq: Once | RESPIRATORY_TRACT | Status: AC
  Administered 2017-04-01: 5 mg via RESPIRATORY_TRACT
  Filled 2017-04-01: qty 6

## 2017-04-01 MED ORDER — ALPRAZOLAM 0.25 MG PO TABS
0.1250 mg | ORAL_TABLET | Freq: Two times a day (BID) | ORAL | Status: DC | PRN
  Administered 2017-04-02 – 2017-04-05 (×4): 0.125 mg via ORAL
  Filled 2017-04-01 (×5): qty 1

## 2017-04-01 MED ORDER — DICYCLOMINE HCL 20 MG PO TABS
20.0000 mg | ORAL_TABLET | Freq: Three times a day (TID) | ORAL | Status: DC | PRN
  Administered 2017-04-01 – 2017-04-02 (×2): 20 mg via ORAL
  Filled 2017-04-01 (×3): qty 1

## 2017-04-01 MED ORDER — LEVOFLOXACIN 500 MG PO TABS: 500.0000 mg | ORAL_TABLET | Freq: Every day | ORAL | Status: DC

## 2017-04-01 MED ORDER — SENNOSIDES-DOCUSATE SODIUM 8.6-50 MG PO TABS
2.0000 | ORAL_TABLET | Freq: Every day | ORAL | Status: DC
  Administered 2017-04-01 – 2017-04-06 (×6): 2 via ORAL
  Filled 2017-04-01 (×6): qty 2

## 2017-04-01 MED ORDER — FUROSEMIDE 10 MG/ML IJ SOLN
40.0000 mg | Freq: Once | INTRAMUSCULAR | Status: DC
  Filled 2017-04-01: qty 160

## 2017-04-01 MED ORDER — LORATADINE 10 MG PO TABS
10.0000 mg | ORAL_TABLET | Freq: Every day | ORAL | Status: DC
  Administered 2017-04-01 – 2017-04-06 (×6): 10 mg via ORAL
  Filled 2017-04-01 (×6): qty 1

## 2017-04-01 MED ORDER — TRIPLE ANTIBIOTIC 3.5-400-5000 EX OINT
1.0000 "application " | TOPICAL_OINTMENT | Freq: Every day | CUTANEOUS | Status: DC
  Administered 2017-04-01 – 2017-04-06 (×5): 1 via TOPICAL
  Filled 2017-04-01 (×7): qty 1

## 2017-04-01 MED ORDER — CLOPIDOGREL BISULFATE 75 MG PO TABS
75.0000 mg | ORAL_TABLET | Freq: Every day | ORAL | Status: DC
Start: 2017-04-01 — End: 2017-04-06
  Administered 2017-04-01 – 2017-04-06 (×6): 75 mg via ORAL
  Filled 2017-04-01 (×6): qty 1

## 2017-04-01 MED ORDER — ONDANSETRON HCL 4 MG/2ML IJ SOLN: 4.0000 mg | Freq: Four times a day (QID) | INTRAMUSCULAR | Status: DC | PRN

## 2017-04-01 MED ORDER — POLYETHYLENE GLYCOL 3350 17 G PO PACK
17.0000 g | PACK | Freq: Every day | ORAL | Status: DC
  Administered 2017-04-01 – 2017-04-02 (×2): 17 g via ORAL
  Filled 2017-04-01 (×2): qty 1

## 2017-04-01 MED ORDER — ACETAMINOPHEN 325 MG PO TABS
650.0000 mg | ORAL_TABLET | Freq: Four times a day (QID) | ORAL | Status: DC | PRN
  Administered 2017-04-04 – 2017-04-06 (×2): 650 mg via ORAL
  Filled 2017-04-01 (×2): qty 2

## 2017-04-01 MED ORDER — ONDANSETRON HCL 4 MG PO TABS: 4.0000 mg | ORAL_TABLET | Freq: Four times a day (QID) | ORAL | Status: DC | PRN

## 2017-04-01 MED ORDER — GUAIFENESIN ER 600 MG PO TB12
600.0000 mg | ORAL_TABLET | Freq: Two times a day (BID) | ORAL | Status: DC
  Filled 2017-04-01: qty 1

## 2017-04-01 MED ORDER — SODIUM CHLORIDE 0.9% FLUSH
3.0000 mL | Freq: Two times a day (BID) | INTRAVENOUS | Status: DC
  Administered 2017-04-02 – 2017-04-06 (×10): 3 mL via INTRAVENOUS

## 2017-04-01 MED ORDER — FLUTICASONE PROPIONATE 50 MCG/ACT NA SUSP
2.0000 | Freq: Every day | NASAL | Status: DC
  Administered 2017-04-01 – 2017-04-06 (×5): 2 via NASAL
  Filled 2017-04-01: qty 16

## 2017-04-01 MED ORDER — FUROSEMIDE 10 MG/ML IJ SOLN
40.0000 mg | Freq: Every day | INTRAMUSCULAR | Status: DC
  Administered 2017-04-02: 40 mg via INTRAVENOUS
  Filled 2017-04-01: qty 160

## 2017-04-01 MED ORDER — FUROSEMIDE 10 MG/ML IJ SOLN: 40.0000 mg | Freq: Two times a day (BID) | INTRAMUSCULAR | Status: DC

## 2017-04-01 MED ORDER — GABAPENTIN 300 MG PO CAPS
300.0000 mg | ORAL_CAPSULE | Freq: Three times a day (TID) | ORAL | Status: DC
  Administered 2017-04-02 (×2): 300 mg via ORAL
  Filled 2017-04-01 (×3): qty 1

## 2017-04-01 MED ORDER — ENOXAPARIN SODIUM 30 MG/0.3ML ~~LOC~~ SOLN
30.0000 mg | SUBCUTANEOUS | Status: DC
  Administered 2017-04-01 – 2017-04-05 (×5): 30 mg via SUBCUTANEOUS
  Filled 2017-04-01 (×5): qty 0.3

## 2017-04-01 MED ORDER — GUAIFENESIN ER 600 MG PO TB12
600.0000 mg | ORAL_TABLET | Freq: Two times a day (BID) | ORAL | Status: DC
  Administered 2017-04-02 – 2017-04-06 (×10): 600 mg via ORAL
  Filled 2017-04-01 (×10): qty 1

## 2017-04-01 MED ORDER — BOOST HIGH PROTEIN PO LIQD
1.0000 | Freq: Every day | ORAL | Status: DC
  Administered 2017-04-03 – 2017-04-06 (×4): 237 mL via ORAL
  Filled 2017-04-01 (×6): qty 237

## 2017-04-01 MED ORDER — LEVOFLOXACIN 750 MG PO TABS: 750.0000 mg | ORAL_TABLET | Freq: Once | ORAL | Status: DC

## 2017-04-01 MED ORDER — ALBUTEROL SULFATE (2.5 MG/3ML) 0.083% IN NEBU
2.5000 mg | INHALATION_SOLUTION | RESPIRATORY_TRACT | Status: DC
  Administered 2017-04-02 – 2017-04-06 (×24): 2.5 mg via RESPIRATORY_TRACT
  Filled 2017-04-01 (×27): qty 3

## 2017-04-01 MED ORDER — ACETAMINOPHEN 650 MG RE SUPP: 650.0000 mg | Freq: Four times a day (QID) | RECTAL | Status: DC | PRN

## 2017-04-01 MED ORDER — CARVEDILOL 25 MG PO TABS
25.0000 mg | ORAL_TABLET | Freq: Two times a day (BID) | ORAL | Status: DC
  Administered 2017-04-01 – 2017-04-06 (×10): 25 mg via ORAL
  Filled 2017-04-01 (×10): qty 1

## 2017-04-01 MED ORDER — NITROGLYCERIN 2 % TD OINT
1.0000 [in_us] | TOPICAL_OINTMENT | Freq: Four times a day (QID) | TRANSDERMAL | Status: DC
  Administered 2017-04-02 – 2017-04-06 (×19): 1 [in_us] via TOPICAL
  Filled 2017-04-01 (×7): qty 1
  Filled 2017-04-01: qty 2
  Filled 2017-04-01 (×8): qty 1

## 2017-04-01 MED ORDER — TRAMADOL HCL 50 MG PO TABS
50.0000 mg | ORAL_TABLET | ORAL | Status: DC | PRN
  Administered 2017-04-02: 50 mg via ORAL
  Filled 2017-04-01: qty 1

## 2017-04-01 NOTE — ED Triage Notes (Signed)
Pt bib EMS from home w/ c/o SOB, gradually worsening over last week. Pt sts that she has had productive cough x 1 week "off and on". Pt alert, audible rattling/gurgeling heard as pt breathes.  Denis CP, n/v/d, fevers or chills.

## 2017-04-01 NOTE — ED Notes (Signed)
Recollect sent to lab 

## 2017-04-01 NOTE — ED Provider Notes (Signed)
[facility] Provider Note   CSN: [id number] Arrival date & time: 04/01/17  1412     History   Chief Complaint Chief Complaint  Patient presents with  . Shortness of Breath    HPI [name] [name] is a 83 y.o. female history of CHF, CAD, COPD here presenting with shortness of breath, cough. Patient is from an assisted living and has been having productive cough for the last week or so. She also noticed some audible wheezing as well. Denies any fevers or chills or leg swelling. Patient was admitted about a month ago for COPD exacerbation with similar symptoms.   The history is provided by the patient and a relative.    Past Medical History:  Diagnosis Date  . Anemia   . CHF (congestive heart failure) ([place])   . Coronary artery disease   . Emphysema lung ([place])   . Emphysema of lung ([place])   . GERD (gastroesophageal reflux disease)   . Hyperlipidemia   . Hypertension   . Neuropathy   . Osteoporosis   . Peripheral vascular disease ([place])   . Renal disorder   . Stroke [facility])     Patient Active Problem List   Diagnosis Date Noted  . COPD exacerbation ([place]) 02/18/2017  . Pressure injury of skin 02/18/2017  . Chronic diastolic heart failure ([place]) 12/15/2016  . HTN (hypertension) 12/15/2016  . Hypotension 11/16/2016  . CAP (community acquired pneumonia) 11/07/2016  . Generalized anxiety disorder 09/26/2016  . PAD (peripheral artery disease) ([place]) 10/05/2013  . Chronic venous insufficiency 10/05/2013    Past Surgical History:  Procedure Laterality Date  . ABDOMINAL HYSTERECTOMY  1982  . CATARACT EXTRACTION    . CHOLECYSTECTOMY  1992  . JOINT REPLACEMENT  2005    OB History    Gravida Para Term Preterm AB Living   3 1     2 1    SAB TAB Ectopic Multiple Live Births   2              Obstetric Comments   1st Menstrual Cycle:  17 1st Pregnancy:  35       Home Medications    Prior to Admission medications   Medication Sig Start Date End Date Taking?  Authorizing Provider  acetaminophen (TYLENOL) 325 MG tablet Take 650 mg by mouth every 6 (six) hours as needed for mild pain or moderate pain.   Yes [provider]  albuterol (PROVENTIL HFA;VENTOLIN HFA) 108 (90 Base) MCG/ACT inhaler Inhale 2 puffs into the lungs every 6 (six) hours as needed for wheezing or shortness of breath. 02/15/17  Yes Darel Hong, MD  ALPRAZolam Duanne Moron) 0.25 MG tablet Take 0.25 mg by mouth 2 (two) times daily as needed for anxiety or agitation. 11/03/16  Yes [provider]  aspirin 81 MG tablet Take 81 mg by mouth daily.   Yes [provider]  carvedilol (COREG) 25 MG tablet Take 1 tablet (25 mg total) by mouth 2 (two) times daily. 02/20/17  Yes Mody, Ulice Bold, MD  celecoxib (CELEBREX) 100 MG capsule Take 100 mg by mouth every other day as needed. 10/15/16  Yes [provider]  Cholecalciferol 2000 units CAPS Take 4,000 Units by mouth daily.    Yes [provider]  clopidogrel (PLAVIX) 75 MG tablet Take 1 tablet by mouth daily. 09/18/13  Yes [provider]  cyanocobalamin 1000 MCG tablet Take 1,000 mcg by mouth daily.    Yes [provider]  dicyclomine (BENTYL) 20 MG  tablet Take 1 tablet (20 mg total) by mouth 3 (three) times daily as needed for spasms. 11/28/15  Yes Daymon Larsen, MD  FLECTOR 1.3 % [facility] Place 1 patch onto the skin every 12 (twelve) hours.  09/18/13  Yes [provider]  fluticasone (FLONASE) 50 MCG/ACT nasal spray Place 2 sprays into both nostrils daily.   Yes [provider]  furosemide (LASIX) 20 MG tablet Take 20 mg by mouth daily.   Yes [provider]  gabapentin (NEURONTIN) 300 MG capsule Take 1 capsule by mouth 3 (three) times daily.  09/04/13  Yes [provider]  loratadine (CLARITIN) 10 MG tablet Take 10 mg by mouth daily.   Yes [provider]  neomycin-bacitracin-polymyxin (NEOSPORIN) [phone] ointment Apply 1 application topically daily.    Yes [provider]  nitroGLYCERIN (NITRODUR - DOSED IN MG/24 HR) 0.2 mg/hr patch Place 1 patch onto the skin daily.  09/18/13  Yes [provider]  ondansetron (ZOFRAN) 4 MG tablet Take 1 tablet (4 mg total) by mouth every 8 (eight) hours as needed for nausea or vomiting. 11/28/15  Yes Daymon Larsen, MD  polyethylene glycol [facility] / Floria Raveling) packet Take 17 g by mouth daily.   Yes [provider]  potassium chloride SA (K-DUR,KLOR-CON) 10 MEQ tablet Take 1 tablet (10 mEq total) by mouth daily. 11/09/16  Yes [name], MD  Probiotic Product (ALIGN) 4 MG CAPS Take 4 mg by mouth daily.   Yes [provider]  ranitidine (ZANTAC) 150 MG capsule Take 150 mg by mouth 2 (two) times daily.   Yes [provider]  sennosides-docusate sodium (SENOKOT-S) 8.6-50 MG tablet Take 2 tablets by mouth daily.   Yes [provider]  Spacer/Aero-Hold Chamber Mask MISC 1 Units by Does not apply route every 6 (six) hours as needed. 02/15/17  Yes Darel Hong, MD  traMADol (ULTRAM) 50 MG tablet Take 50 mg by mouth every 4 (four) hours as needed for moderate pain or severe pain.   Yes [provider]  feeding supplement (BOOST HIGH PROTEIN) LIQD Take 1 Container by mouth daily.    [provider]    Family History Family History  Problem Relation Age of Onset  . Colon cancer Sister   . CVA Mother   . CAD Mother   . Hypertension Mother     Social History Social History  Substance Use Topics  . Smoking status: Never Smoker  . Smokeless tobacco: Never Used  . Alcohol use No     Allergies   Cholesterol; Morphine and related; and Zoloft [sertraline hcl]   Review of Systems Review of Systems  Respiratory: Positive for shortness of breath.   All other systems reviewed and are negative.    Physical Exam Updated Vital Signs BP (!) 174/75 (BP Location: Right Arm)   Pulse 90   Temp 98.2 F (36.8 C) (Oral)   Resp 20    Ht 5\' 6"  (1.676 m)   Wt 65.3 kg (144 lb)   SpO2 100%   BMI 23.24 kg/m   Physical Exam  Constitutional: She is oriented to person, place, and time.  Chronically ill, tachypneic   HENT:  Head: Normocephalic.  Mouth/Throat: Oropharynx is clear and moist.  Eyes: EOM are normal. Pupils are equal, round, and reactive to light.  Neck: Normal range of motion. Neck supple.  Cardiovascular: Normal rate, regular rhythm and normal heart sounds.   Pulmonary/Chest:  Tachypneic, wheezing throughout   Abdominal: Soft. Bowel  sounds are normal. She exhibits no distension. There is no tenderness.  Musculoskeletal: Normal range of motion. She exhibits no edema.  Neurological: She is alert and oriented to person, place, and time.  Skin: Skin is warm.  Psychiatric: She has a normal mood and affect.  Nursing note and vitals reviewed.    ED Treatments / Results  Labs (all labs ordered are listed, but only abnormal results are displayed) Labs Reviewed  CBC WITH DIFFERENTIAL/PLATELET - Abnormal; Notable for the following:       Result Value   WBC 15.6 (*)    RBC 2.59 (*)    Hemoglobin 8.7 (*)    HCT 25.5 (*)    Neutro Abs 12.0 (*)    Monocytes Absolute 1.8 (*)    All other components within normal limits  BASIC METABOLIC PANEL - Abnormal; Notable for the following:    Sodium 131 (*)    Potassium 5.4 (*)    Chloride 95 (*)    Glucose, Bld 166 (*)    BUN 51 (*)    Creatinine, Ser 2.10 (*)    GFR calc non Af Amer 19 (*)    GFR calc Af Amer 22 (*)    All other components within normal limits  BRAIN NATRIURETIC PEPTIDE - Abnormal; Notable for the following:    B Natriuretic Peptide 354.0 (*)    All other components within normal limits  TROPONIN I    EKG  EKG Interpretation  Date/Time:  Thursday April 01 2017 14:21:00 EDT Ventricular Rate:  91 PR Interval:    QRS Duration: 94 QT Interval:  350 QTC Calculation: 424 R Axis:   56 Text Interpretation:  Sinus rhythm Ventricular  premature complex Probable lateral infarct, old Artifact in lead(s) I II III aVR aVL aVF V1 V2 V3 V4 and baseline wander in lead(s) V3 No significant change since last tracing Confirmed by Wandra Arthurs [phone]) on 04/01/2017 3:06:24 PM       Radiology Dg Chest 2 View  Result Date: 04/01/2017 CLINICAL DATA:  Shortness of breath for the past 2-3 days. Fatigue and weakness for the past 4 days. EXAM: CHEST  2 VIEW COMPARISON:  02/20/2017. FINDINGS: Stable enlarged cardiac silhouette. Mild increase in prominence of the interstitial markings with interval small right pleural effusion and minimal left pleural effusion. Comminuted left humeral shaft fracture with partial healing. IMPRESSION: 1. Mild changes of congestive heart failure. 2. Comminuted left humeral shaft fracture with partial healing. Electronically Signed   By: Claudie Revering M.D.   On: 04/01/2017 16:19    Procedures Procedures (including critical care time)  Medications Ordered in ED Medications  albuterol (PROVENTIL) (2.5 MG/3ML) 0.083% nebulizer solution 5 mg (5 mg Nebulization Given 04/01/17 1429)  albuterol (PROVENTIL) (2.5 MG/3ML) 0.083% nebulizer solution 5 mg (5 mg Nebulization Given 04/01/17 1615)  ipratropium (ATROVENT) nebulizer solution 0.5 mg (0.5 mg Nebulization Given 04/01/17 1615)  methylPREDNISolone sodium succinate (SOLU-MEDROL) 125 mg/2 mL injection 125 mg (125 mg Intravenous Given 04/01/17 1615)     Initial Impression / Assessment and Plan / ED Course  I have reviewed the triage vital signs and the nursing notes.  Pertinent labs & imaging results that were available during my care of the patient were reviewed by me and considered in my medical decision making (see chart for details).     [name] [name] is a 83 y.o. female here with cough, wheezing, weakness. Lives in an assisted living but has been coughing and weak and hasn't  been able to walk much. Has diffuse wheezing on exam. Likely COPD exacerbation. Will get  labs, CXR. Will give nebs, steroids.   4:55 PM Still wheezing after neb, steroids. WBC 15 but CXR showed no obvious pneumonia. Will admit for COPD exacerbation. BNP 350, improved from previous and doesn't appear volume overloaded.   Final Clinical Impressions(s) / ED Diagnoses   Final diagnoses:  None    New Prescriptions New Prescriptions   No medications on file     Drenda Freeze, MD 04/01/17 1656

## 2017-04-01 NOTE — ED Notes (Signed)
Report given to Tammy RN  

## 2017-04-01 NOTE — H&P (Signed)
[facility] [place] NAME: [name] [name]    MR#:  [id number]  DATE OF BIRTH:  05-09-1923  DATE OF ADMISSION:  04/01/2017  PRIMARY CARE PHYSICIAN: Raelyn Number, MD   REQUESTING/REFERRING PHYSICIAN:   CHIEF COMPLAINT:   Chief Complaint  Patient presents with  . Shortness of Breath    HISTORY OF PRESENT ILLNESS: [name] [name]  is a 83 y.o. female with a known history of COPD, diastolic CHF, chronic respiratory failure, coronary artery disease, hypertension, hyperlipidemia, who presents to the hospital with complaints of worsening shortness of breath over the past one week, cough, dark yellow phlegm production, wheezing, weight gain of approximately 4 pounds over the past one week. She was given 2 nebs. On arrival to emergency room, hospitalist services were contacted for admission. Patient's chest x-ray was concerning for mild CHF. Patient's family is concerned about patient being more somnolent than usual  PAST MEDICAL HISTORY:   Past Medical History:  Diagnosis Date  . Anemia   . CHF (congestive heart failure) ([place])   . Coronary artery disease   . Emphysema lung ([place])   . Emphysema of lung ([place])   . GERD (gastroesophageal reflux disease)   . Hyperlipidemia   . Hypertension   . Neuropathy   . Osteoporosis   . Peripheral vascular disease ([place])   . Renal disorder   . Stroke [facility])     PAST SURGICAL HISTORY: Past Surgical History:  Procedure Laterality Date  . ABDOMINAL HYSTERECTOMY  1982  . CATARACT EXTRACTION    . CHOLECYSTECTOMY  1992  . JOINT REPLACEMENT  2005    SOCIAL HISTORY:  Social History  Substance Use Topics  . Smoking status: Never Smoker  . Smokeless tobacco: Never Used  . Alcohol use No    FAMILY HISTORY:  Family History  Problem Relation Age of Onset  . Colon cancer Sister   . CVA Mother   . CAD Mother   . Hypertension Mother     DRUG ALLERGIES:  Allergies  Allergen Reactions  .  Cholesterol Other (See Comments)    Patient states she is allergic to a cholesterol medication, which causes flu-like symptoms. Name is unknown.  . Morphine And Related     Heart problems   . Zoloft [Sertraline Hcl] Swelling    Review of Systems  Constitutional: Positive for malaise/fatigue. Negative for chills, fever and weight loss.  HENT: Negative for congestion.   Eyes: Negative for blurred vision and double vision.  Respiratory: Positive for cough, sputum production, shortness of breath and wheezing.   Cardiovascular: Positive for leg swelling. Negative for chest pain, palpitations, orthopnea and PND.  Gastrointestinal: Negative for abdominal pain, blood in stool, constipation, diarrhea, nausea and vomiting.  Genitourinary: Negative for dysuria, frequency, hematuria and urgency.  Musculoskeletal: Negative for falls.  Neurological: Negative for dizziness, tremors, focal weakness and headaches.  Endo/Heme/Allergies: Does not bruise/bleed easily.  Psychiatric/Behavioral: Negative for depression. The patient does not have insomnia.     MEDICATIONS AT HOME:  Prior to Admission medications   Medication Sig Start Date End Date Taking? Authorizing Provider  acetaminophen (TYLENOL) 325 MG tablet Take 650 mg by mouth every 6 (six) hours as needed for mild pain or moderate pain.   Yes [provider]  albuterol (PROVENTIL HFA;VENTOLIN HFA) 108 (90 Base) MCG/ACT inhaler Inhale 2 puffs into the lungs every 6 (six) hours as needed for wheezing or shortness of breath. 02/15/17  Yes Darel Hong,  MD  ALPRAZolam (XANAX) 0.25 MG tablet Take 0.25 mg by mouth 2 (two) times daily as needed for anxiety or agitation. 11/03/16  Yes [provider]  aspirin 81 MG tablet Take 81 mg by mouth daily.   Yes [provider]  carvedilol (COREG) 25 MG tablet Take 1 tablet (25 mg total) by mouth 2 (two) times daily. 02/20/17  Yes Mody, Ulice Bold, MD  celecoxib (CELEBREX) 100 MG capsule Take  100 mg by mouth every other day as needed. 10/15/16  Yes [provider]  Cholecalciferol 2000 units CAPS Take 4,000 Units by mouth daily.    Yes [provider]  clopidogrel (PLAVIX) 75 MG tablet Take 1 tablet by mouth daily. 09/18/13  Yes [provider]  cyanocobalamin 1000 MCG tablet Take 1,000 mcg by mouth daily.    Yes [provider]  dicyclomine (BENTYL) 20 MG tablet Take 1 tablet (20 mg total) by mouth 3 (three) times daily as needed for spasms. 11/28/15  Yes Daymon Larsen, MD  FLECTOR 1.3 % E [facility] Place 1 patch onto the skin every 12 (twelve) hours.  09/18/13  Yes [provider]  fluticasone (FLONASE) 50 MCG/ACT nasal spray Place 2 sprays into both nostrils daily.   Yes [provider]  furosemide (LASIX) 20 MG tablet Take 20 mg by mouth daily.   Yes [provider]  gabapentin (NEURONTIN) 300 MG capsule Take 1 capsule by mouth 3 (three) times daily.  09/04/13  Yes [provider]  loratadine (CLARITIN) 10 MG tablet Take 10 mg by mouth daily.   Yes [provider]  neomycin-bacitracin-polymyxin (NEOSPORIN) [phone] ointment Apply 1 application topically daily.   Yes [provider]  nitroGLYCERIN (NITRODUR - DOSED IN MG/24 HR) 0.2 mg/hr patch Place 1 patch onto the skin daily.  09/18/13  Yes [provider]  ondansetron (ZOFRAN) 4 MG tablet Take 1 tablet (4 mg total) by mouth every 8 (eight) hours as needed for nausea or vomiting. 11/28/15  Yes Daymon Larsen, MD  polyethylene glycol [place] / Floria Raveling) packet Take 17 g by mouth daily.   Yes [provider]  potassium chloride SA (K-DUR,KLOR-CON) 10 MEQ tablet Take 1 tablet (10 mEq total) by mouth daily. 11/09/16  Yes [name], MD  Probiotic Product (ALIGN) 4 MG CAPS Take 4 mg by mouth daily.   Yes [provider]  ranitidine (ZANTAC) 150 MG capsule Take 150 mg by mouth 2 (two) times daily.   Yes [provider]  sennosides-docusate sodium (SENOKOT-S) 8.6-50 MG tablet Take 2 tablets by mouth daily.   Yes [provider]  Spacer/Aero-Hold Chamber Mask MISC 1 Units by Does not apply route every 6 (six) hours as needed. 02/15/17  Yes Darel Hong, MD  traMADol (ULTRAM) 50 MG tablet Take 50 mg by mouth every 4 (four) hours as needed for moderate pain or severe pain.   Yes [provider]  feeding supplement (BOOST HIGH PROTEIN) LIQD Take 1 Container by mouth daily.    [provider]      PHYSICAL EXAMINATION:   VITAL SIGNS: Blood pressure (!) 160/71, pulse 93, temperature 98.2 F (36.8 C), temperature source Oral, resp. rate 17, height 5\' 6"  (1.676 m), weight 65.3 kg (144 lb), SpO2 95 %.  GENERAL:  82 y.o.-year-old patient lying in the bed In mild respiratory distress, intermittently coughing, loose dentures.  EYES: Pupils equal, round, reactive to light and accommodation. No scleral icterus. Extraocular muscles intact.  HEENT: Head atraumatic,  normocephalic. Oropharynx and nasopharynx clear.  NECK:  Supple, no jugular venous distention. No thyroid enlargement, no tenderness.  LUNGS: Normal breath sounds bilaterally, no wheezing, but bilateral basilar rales,rhonchi and crepitations were heard. Intermittent use of accessory muscles of respiration with cough and speech.  CARDIOVASCULAR: S1, S2 , intermittently irregular. No murmurs, rubs, or gallops, distant.  ABDOMEN: Soft, nontender, nondistended. Bowel sounds present. No organomegaly or mass.  EXTREMITIES: 2+ lower extremity and pedal edema, no cyanosis, or clubbing. Dressing on  left lower extremity ankle and foot and right extremity toes, dressing is not taken, since it was just placed by wound care clinic  NEUROLOGIC: Cranial nerves II through XII are intact. Muscle strength 5/5 in all extremities. Sensation intact. Gait not checked.  PSYCHIATRIC: The patient is alert and oriented x 3.  SKIN: No obvious  rash, lesion, or ulcer.   LABORATORY PANEL:   CBC  Recent Labs Lab 04/01/17 1426  WBC 15.6*  HGB 8.7*  HCT 25.5*  PLT 236  MCV 98.4  MCH 33.5  MCHC 34.1  RDW 14.1  LYMPHSABS 1.4  MONOABS 1.8*  EOSABS 0.3  BASOSABS 0.1   ------------------------------------------------------------------------------------------------------------------  Chemistries   Recent Labs Lab 04/01/17 1503  NA 131*  K 5.4*  CL 95*  CO2 28  GLUCOSE 166*  BUN 51*  CREATININE 2.10*  CALCIUM 9.2   ------------------------------------------------------------------------------------------------------------------  Cardiac Enzymes  Recent Labs Lab 04/01/17 1503  TROPONINI <0.03   ------------------------------------------------------------------------------------------------------------------  RADIOLOGY: Dg Chest 2 View  Result Date: 04/01/2017 CLINICAL DATA:  Shortness of breath for the past 2-3 days. Fatigue and weakness for the past 4 days. EXAM: CHEST  2 VIEW COMPARISON:  02/20/2017. FINDINGS: Stable enlarged cardiac silhouette. Mild increase in prominence of the interstitial markings with interval small right pleural effusion and minimal left pleural effusion. Comminuted left humeral shaft fracture with partial healing. IMPRESSION: 1. Mild changes of congestive heart failure. 2. Comminuted left humeral shaft fracture with partial healing. Electronically Signed   By: Claudie Revering M.D.   On: 04/01/2017 16:19    EKG: Orders placed or performed during the hospital encounter of 04/01/17  . ED EKG  . ED EKG  . EKG 12-Lead  . EKG 12-Lead   EKG in the emergency room revealed a sinus rhythm at 91 bpm, normal axis, ventricular premature complexes, possible lateral infarct, old, nonspecific ST-T changes, artifact   IMPRESSION AND PLAN:  Active Problems:   Acute on chronic diastolic CHF (congestive heart failure) (HCC)   Acute on chronic respiratory failure with hypoxia (HCC)   Malignant  hypertension   Hyponatremia   Hyperkalemia  #1 Acute on chronic respiratory failure with hypoxia, likely due to combination of COPD exacerbation and acute on chronic diastolic CHF, admit patient to medical floor, continue oxygen, wean off oxygen to her baseline as tolerated #2. Acute on chronic diastolic CHF, continue Lasix intravenously, following ins and outs, weight, patient's kidney function closely, add nitroglycerin topically to facilitate diuresis #3. Malignant essential hypertension, add nitroglycerin topically, . Continue Coreg, advance medications as needed #4. CK D, stage IV, follow with diuresis #5 . COPD exacerbation, continue patient on nebulizing therapy, tiotropium, initiate levofloxacin, get sputum cultures if possible, add Humibid #6. Somnolence, etiology is unclear, could be related to CO2 retention, get ABGs #7. Generalized weakness, get physical therapist involved for recommendations  Management plans discussed with the patient, family and they are in agreement.  CODE STATUS: Code Status History    Date Active Date Inactive Code Status Order  ID Comments User Context   02/18/2017 10:48 AM 02/21/2017  5:05 PM DNR [id number]  Loletha Grayer, MD ED   11/16/2016  6:31 PM 11/18/2016  7:41 PM DNR [id number]  Awilda Bill, NP ED   11/07/2016  2:54 PM 11/09/2016  9:28 PM DNR [id number]  Idelle Crouch, MD Inpatient   11/07/2016  2:13 PM 11/07/2016  2:54 PM Full Code [id number]  Idelle Crouch, MD Inpatient    Questions for Most Recent Historical Code Status (Order [id number])    Question Answer Comment   In the event of cardiac or respiratory ARREST Do not call a "code blue"    In the event of cardiac or respiratory ARREST Do not perform Intubation, CPR, defibrillation or ACLS    In the event of cardiac or respiratory ARREST Use medication by any route, position, wound care, and other measures to relive pain and suffering. May use oxygen, suction and manual treatment of airway  obstruction as needed for comfort.    Comments nurse may pronounce         Advance Directive Documentation     Most Recent Value  Type of Advance Directive  Living will  Pre-existing out of facility DNR order (yellow form or pink MOST form)  -  "MOST" Form in Place?  -       TOTAL TIME TAKING CARE OF THIS PATIENT: 55 minutes.    Theodoro Grist M.D on 04/01/2017 at 5:26 PM  Between 7am to 6pm - Pager - [phone] After 6pm go to [email or web address] - password [email or web address] [place] [facility]  Office  [phone]  CC: Primary care physician; Raelyn Number, MD

## 2017-04-02 LAB — CBC
HCT: 24.8 % — ABNORMAL LOW (ref 35.0–47.0)
HEMOGLOBIN: 8.2 g/dL — AB (ref 12.0–16.0)
MCH: 32.9 pg (ref 26.0–34.0)
MCHC: 33.2 g/dL (ref 32.0–36.0)
MCV: 99.2 fL (ref 80.0–100.0)
Platelets: 193 10*3/uL (ref 150–440)
RBC: 2.5 MIL/uL — ABNORMAL LOW (ref 3.80–5.20)
RDW: 13.7 % (ref 11.5–14.5)
WBC: 20.6 10*3/uL — AB (ref 3.6–11.0)

## 2017-04-02 LAB — TROPONIN I: Troponin I: <0.03 ng/mL (ref <0.03)

## 2017-04-02 LAB — BASIC METABOLIC PANEL
Anion gap: 9 (ref 5–15)
BUN: 53 mg/dL — ABNORMAL HIGH (ref 6–20)
CALCIUM: 8.8 mg/dL — AB (ref 8.9–10.3)
CO2: 28 mmol/L (ref 22–32)
Chloride: 96 mmol/L — ABNORMAL LOW (ref 101–111)
Creatinine, Ser: 2.01 mg/dL — ABNORMAL HIGH (ref 0.44–1.00)
GFR calc Af Amer: 24 mL/min — ABNORMAL LOW (ref >60)
GFR, EST NON AFRICAN AMERICAN: 20 mL/min — AB (ref >60)
Glucose, Bld: 179 mg/dL — ABNORMAL HIGH (ref 65–99)
Potassium: 4.1 mmol/L (ref 3.5–5.1)
Sodium: 133 mmol/L — ABNORMAL LOW (ref 135–145)

## 2017-04-02 MED ORDER — TRAMADOL HCL 50 MG PO TABS
50.0000 mg | ORAL_TABLET | Freq: Two times a day (BID) | ORAL | Status: DC | PRN
  Administered 2017-04-03 – 2017-04-06 (×4): 50 mg via ORAL
  Filled 2017-04-02 (×4): qty 1

## 2017-04-02 MED ORDER — POLYETHYLENE GLYCOL 3350 17 G PO PACK
17.0000 g | PACK | Freq: Every day | ORAL | Status: DC
  Administered 2017-04-02 – 2017-04-06 (×5): 17 g via ORAL
  Filled 2017-04-02 (×4): qty 1

## 2017-04-02 MED ORDER — LEVOFLOXACIN 750 MG PO TABS
750.0000 mg | ORAL_TABLET | Freq: Once | ORAL | Status: AC
  Administered 2017-04-02: 750 mg via ORAL
  Filled 2017-04-02: qty 1

## 2017-04-02 MED ORDER — GABAPENTIN 100 MG PO CAPS
200.0000 mg | ORAL_CAPSULE | Freq: Three times a day (TID) | ORAL | Status: DC
  Administered 2017-04-02 – 2017-04-06 (×12): 200 mg via ORAL
  Filled 2017-04-02 (×12): qty 2

## 2017-04-02 MED ORDER — LEVOFLOXACIN 500 MG PO TABS
500.0000 mg | ORAL_TABLET | ORAL | Status: DC
  Administered 2017-04-04: 500 mg via ORAL
  Filled 2017-04-02 (×2): qty 1

## 2017-04-02 NOTE — Progress Notes (Signed)
Feet are wrapped. Wounds to feet. Unable to assess. Pt is followed by out patient wound care clinic.Will place wound consult.

## 2017-04-02 NOTE — Evaluation (Signed)
Physical Therapy Evaluation Patient Details Name: [name] [name] MRN: [id number] DOB: 27-Feb-1923 Today's Date: 04/02/2017   History of Present Illness  Pt is a [name] admitted to acute care recently for SOB, returns with COPD exacerbation and general weakness on 04/01/17. Prior to admission, pt required assistance with ADL's and required [facility] for in home navigation, secondary to fall in April that led to L humeral fx, (currently partially healed). Pt utilizes 2L supplemental O2 24/7 in the home, secondary to COPD. Pt PMH includes: OPD, CHF, chronic respiratory failure, CAD, HTN, HLD, PVD, anemia, emphysema, GERD, neuropathy, OP renal disorder, and stroke.   Clinical Impression  Pt is a pleasant 83 y.o. F, admitted to acute care recently for SOB, returns with COPD exacerbation and general weakness on 04/01/17. Per pt report, she fell in April of 2018, which led to a L humeral fx. Upon viewing pt imaging, SPT noted partial healing of L humeral fx and consulted MD, who confirmed there are no restrictions to pt's L UE. However, pt is highly fearful and refuses to utilize L UE for functional tasks. Pt performs bed mobility, tranfers, and ambulation with ModA (using RW); ambulating a total of 5 ft. Pt utilized 2 L supplemental O2 throughout entirety of session, with O2 sat dropping to 80% with exertion. Pt recovered quickly to >90% with seated rest break and verbal/visula cues to practice pursed lipped breathing (pt is HOH and responds well to visual cues). Pt presents with the following deficits: strength, cardiopulmonary endurance, impulsivity, and impaired safety awareness. Overall, pt responded well to today's treatment with no adverse affects. Pt would benefit from skilled PT to address the previously mentioned impairments and promote return to PLOF. Currently recommend home health PT with pt returning to [facility].     Follow Up Recommendations Home health PT    Equipment Recommendations  None  recommended by PT    Recommendations for Other Services       Precautions / Restrictions Precautions Precautions: None Restrictions Weight Bearing Restrictions: No      Mobility  Bed Mobility Overal bed mobility: Needs Assistance Bed Mobility: Supine to Sit     Supine to sit: Mod assist     General bed mobility comments: Unable to roll onto L side, secondary to L humeral fx. Pt requires ModA with supine to sit, secondary to generalized weakness and fear of using L UE to assist. Requires max verbal cues for sequencing of task.   Transfers Overall transfer level: Needs assistance Equipment used: Rolling walker (2 wheeled) Transfers: Sit to/from Stand Sit to Stand: Mod assist         General transfer comment: ModA for STS secondary to gnerealized LE weakness and fear of using L UE to assist. Pt requires mod verbal/tactile cues for sequencing and maneuvering RW during transfer.  Ambulation/Gait Ambulation/Gait assistance: Mod assist Ambulation Distance (Feet): 5 Feet Assistive device: Rolling walker (2 wheeled) Gait Pattern/deviations: Decreased step length - right;Decreased step length - left     General Gait Details: Requires ModA secondary to impaired LE strength, impusivity, and impaired ability to sequence. Pt requires mod verbal cues for sequencing and manageing RW. 2L supplemental O2 utilized; pt O2 sat dropped to 80% with amb, but pt quickly recovered with resting in seated position and cues to practice pursed lipped breathing.  Stairs            Wheelchair Mobility    Modified Rankin (Stroke Patients Only)  Balance Overall balance assessment: Needs assistance;History of Falls Sitting-balance support: Feet supported;Single extremity supported   Sitting balance - Comments: CGA with sitting balance, secondary to impulsivity, generalized muscle weakness, and impaired CV endurance.                                      Pertinent  Vitals/Pain Pain Assessment: No/denies pain    Home Living Family/patient expects to be discharged to:: Assisted living               Home Equipment: Walker - 2 wheels;Wheelchair - manual;Other (comment) (2 L of 24 hr supplemental O2.) Additional Comments: Pt lives at [facility].    Prior Function Level of Independence: Needs assistance   Gait / Transfers Assistance Needed: Able to maneuver WC and perform transfers.            Hand Dominance        Extremity/Trunk Assessment   Upper Extremity Assessment Upper Extremity Assessment: LUE deficits/detail LUE Deficits / Details: Pt had fall in April of 2018, which lead to a L humeral fx. Currently, pt has no WB restrictions, though guards L UE and refuses to utilize it to assist with functional tasks. MMT L UE grossly 3/5.    Lower Extremity Assessment Lower Extremity Assessment: Generalized weakness (MMT grossly 4/5 to B LE's)       Communication   Communication: No difficulties;HOH  Cognition Arousal/Alertness: Awake/alert Behavior During Therapy: Impulsive Overall Cognitive Status: Within Functional Limits for tasks assessed                                        General Comments      Exercises Other Exercises Other Exercises: Supine therex with supervision and 2L supplmental O2 x10 reps to B LE's: SAQ's, ankle pumps (R ft, as L was bandaged due to skin breakdown), glute sets, quad sets, and abd. Seated therex at EOB with 2 L supplemental O2 to R LE with CGA x10: LAQ's  Other Exercises: Toileting: required modA with transferring to the L to bed side commode, secondary to impaired LE strength and impulsivity. Required minA for managing clothing during task, due to impaired balance and impulsivity. Supervision while seated, secondary to impulsivity.   Assessment/Plan    PT Assessment Patient needs continued PT services  PT Problem List Decreased strength;Decreased activity tolerance;Decreased  balance;Decreased mobility;Decreased coordination;Decreased safety awareness       PT Treatment Interventions DME instruction;Gait training;Functional mobility training;Therapeutic activities;Therapeutic exercise;Patient/family education    PT Goals (Current goals can be found in the Care Plan section)  Acute Rehab PT Goals Patient Stated Goal: To go home. PT Goal Formulation: With patient Time For Goal Achievement: 04/16/17 Potential to Achieve Goals: Good    Frequency Min 2X/week   Barriers to discharge        Co-evaluation               AM-PAC PT "6 Clicks" Daily Activity  Outcome Measure Difficulty turning over in bed (including adjusting bedclothes, sheets and blankets)?: Total Difficulty moving from lying on back to sitting on the side of the bed? : Total Difficulty sitting down on and standing up from a chair with arms (e.g., wheelchair, bedside commode, etc,.)?: Total Help needed moving to and from a bed to chair (including a wheelchair)?: A  Little Help needed walking in hospital room?: A Lot Help needed climbing 3-5 steps with a railing? : Total 6 Click Score: 9    End of Session Equipment Utilized During Treatment: Gait belt;Oxygen Activity Tolerance: Other (comment) (Limited by drop in O2 sat to 80%, upon exersion. ) Patient left: in chair;with chair alarm set;with call bell/phone within reach;with nursing/sitter in room Nurse Communication: Mobility status (Nurse providing pt meds upon leaving) PT Visit Diagnosis: Unsteadiness on feet (R26.81);Other abnormalities of gait and mobility (R26.89);Muscle weakness (generalized) (M62.81);History of falling (Z91.81)    Time: 1032-1100 PT Time Calculation (min) (ACUTE ONLY): 28 min   Charges:         PT G Codes:        Oran Rein PT, SPT  Bevelyn Ngo 04/02/2017, 12:05 PM

## 2017-04-02 NOTE — Discharge Instructions (Signed)
Heart Failure Clinic appointment on April 15, 2017 at 1:40pm with Darylene Price, [place]. Please call [phone] to reschedule.

## 2017-04-02 NOTE — Care Management (Signed)
Patient is a resident of [facility]. Chronic oxygen through [facility]. Patient has chronic wounds lower extremities and says a nurse comes weekly to change them and she goes to the wound care center once weekly. Have reached out to [facility] to see if agency is still following.  Patient will need home health nurse not only to co=ntinue wound care but to follow up for heart failure.  It is documented that patient gained four pounds in one week.  Unsure if physician was updated prior to proceeding to the ED.

## 2017-04-02 NOTE — Plan of Care (Signed)
Problem: Safety: Goal: Ability to remain free from injury will improve Outcome: Progressing Patient on falls precautions. Patient will remain free from falls and injury during the shift.  Problem: Skin Integrity: Goal: Risk for impaired skin integrity will decrease Outcome: Progressing Patient skin will remain clean and dry. Q 2hr turns to prevent breakdown to bony prominences. Patient will sit in recliner at least once a shift. No new skin break down while in the hospital.  Problem: Fluid Volume: Goal: Ability to maintain a balanced intake and output will improve Outcome: Progressing Patient will remain hemodynamically stable while being diuresis in the hospital.

## 2017-04-02 NOTE — Care Management Important Message (Signed)
Important Message  Patient Details  Name: [name] [name] MRN: [id number] Date of Birth: Aug 23, 1923   Medicare Important Message Given:  Yes Signed IM notice given   Katrina Stack, RN 04/02/2017, 5:13 PM

## 2017-04-02 NOTE — Consult Note (Signed)
[facility] Nurse wound consult note Reason for Consult: Nonhealing wounds to left leg, right toe and stage 3 pressure injury to coccyx.  Seen at wound care center yesterday and dressings should remain intact until Monday, June 18.  Assessed coccyx wound and orders written.  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   Pressure Injury POA: Yes Measurement:coccyx:  1 cm x 0.3 cm wound bed is 25% fibrin slough Wound bed:75% [pale pink Drainage (amount, consistency, odor) minimal serosanguinous Periwound:intact Dressing procedure/placement/frequency:Silicone border foam dressing to absorb drainage and promote autolysis.  Encouraged to turn and reposition every two hours. Dressing to left foot and right toe to remain intact.  Will see Monday if still here.   Domenic Moras RN BSN [place] Pager [phone]

## 2017-04-02 NOTE — Progress Notes (Signed)
[facility] NAME: [name] [name]    MR#:  [id number]  DATE OF BIRTH:  12/02/1922  SUBJECTIVE:  CHIEF COMPLAINT:   Chief Complaint  Patient presents with  . Shortness of Breath     Came with some altered mental status and shortness of breath for last few days. Found to have some CHF. Responded nicely to diuresis and today was alert and oriented, she have hearing deficit but she appears in very good mood and sitting in the chair when I saw in the morning.  REVIEW OF SYSTEMS:  CONSTITUTIONAL: No fever, fatigue or weakness.  EYES: No blurred or double vision.  EARS, NOSE, AND THROAT: No tinnitus or ear pain.  RESPIRATORY: No cough, shortness of breath, wheezing or hemoptysis.  CARDIOVASCULAR: No chest pain, orthopnea, edema.  GASTROINTESTINAL: No nausea, vomiting, diarrhea or abdominal pain.  GENITOURINARY: No dysuria, hematuria.  ENDOCRINE: No polyuria, nocturia,  HEMATOLOGY: No anemia, easy bruising or bleeding SKIN: No rash or lesion. MUSCULOSKELETAL: No joint pain or arthritis.   NEUROLOGIC: No tingling, numbness, weakness.  PSYCHIATRY: No anxiety or depression.   ROS  DRUG ALLERGIES:   Allergies  Allergen Reactions  . Cholesterol Other (See Comments)    Patient states she is allergic to a cholesterol medication, which causes flu-like symptoms. Name is unknown.  . Morphine And Related     Heart problems   . Zoloft [Sertraline Hcl] Swelling    VITALS:  Blood pressure (!) 162/68, pulse 96, temperature 98.1 F (36.7 C), temperature source Oral, resp. rate (!) 21, height 5\' 6"  (1.676 m), weight 65.5 kg (144 lb 6.4 oz), SpO2 99 %.  PHYSICAL EXAMINATION:  GENERAL:  83 y.o.-year-old patient lying in the bed with no acute distress.  EYES: Pupils equal, round, reactive to light and accommodation. No scleral icterus. Extraocular muscles intact.  HEENT: Head atraumatic, normocephalic. Oropharynx and nasopharynx clear.  NECK:   Supple, no jugular venous distention. No thyroid enlargement, no tenderness.  LUNGS: Normal breath sounds bilaterally, no wheezing, bilateral crepitation. No use of accessory muscles of respiration.  CARDIOVASCULAR: S1, S2 normal. No murmurs, rubs, or gallops.  ABDOMEN: Soft, nontender, nondistended. Bowel sounds present. No organomegaly or mass.  EXTREMITIES: some pedal edema,no cyanosis, or clubbing.  NEUROLOGIC: Cranial nerves II through XII are intact. Muscle strength 4/5 in all extremities. Sensation intact. Gait not checked.  PSYCHIATRIC: The patient is alert and oriented x 3.  SKIN: No obvious rash, lesion, or ulcer.   Physical Exam LABORATORY PANEL:   CBC  Recent Labs Lab 04/02/17 0722  WBC 20.6*  HGB 8.2*  HCT 24.8*  PLT 193   ------------------------------------------------------------------------------------------------------------------  Chemistries   Recent Labs Lab 04/02/17 0722  NA 133*  K 4.1  CL 96*  CO2 28  GLUCOSE 179*  BUN 53*  CREATININE 2.01*  CALCIUM 8.8*   ------------------------------------------------------------------------------------------------------------------  Cardiac Enzymes  Recent Labs Lab 04/02/17 0115 04/02/17 0722  TROPONINI <0.03 <0.03   ------------------------------------------------------------------------------------------------------------------  RADIOLOGY:  Dg Chest 2 View  Result Date: 04/01/2017 CLINICAL DATA:  Shortness of breath for the past 2-3 days. Fatigue and weakness for the past 4 days. EXAM: CHEST  2 VIEW COMPARISON:  02/20/2017. FINDINGS: Stable enlarged cardiac silhouette. Mild increase in prominence of the interstitial markings with interval small right pleural effusion and minimal left pleural effusion. Comminuted left humeral shaft fracture with partial healing. IMPRESSION: 1. Mild changes of congestive heart failure. 2. Comminuted left humeral shaft fracture with partial healing.  Electronically Signed    By: Claudie Revering M.D.   On: 04/01/2017 16:19    ASSESSMENT AND PLAN:   Active Problems:   Acute on chronic diastolic CHF (congestive heart failure) (HCC)   Acute on chronic respiratory failure with hypoxia (HCC)   Malignant hypertension   Hyponatremia   Hyperkalemia  #1 Acute on chronic respiratory failure with hypoxia, likely due to combination of COPD exacerbation and acute on chronic diastolic CHF,  continue oxygen, wean off oxygen to her baseline as tolerated #2. Acute on chronic diastolic CHF, continue Lasix intravenously, following ins and outs, weight, patient's kidney function closely, add nitroglycerin topically to facilitate diuresis #3. Malignant essential hypertension, add nitroglycerin topically, . Continue Coreg, advance medications as needed #4. CK D, stage IV, follow with diuresis- stable. #5 . COPD exacerbation, continue patient on nebulizing therapy, tiotropium, levofloxacin, get sputum cultures if possible, add Humibid #6. Somnolence, etiology is unclear, could be related to hypoxia, improved now. #7. Generalized weakness, get physical therapist involved for recommendations #8 recent humerus fracture- healing properly- cont non weight bearing.   All the records are reviewed and case discussed with Care Management/Social Workerr. Management plans discussed with the patient, family and they are in agreement.  CODE STATUS: Full.  TOTAL TIME TAKING CARE OF THIS PATIENT: 35 minutes.     POSSIBLE D/C IN 1-2 DAYS, DEPENDING ON CLINICAL CONDITION.   [name] M.D on 04/02/2017   Between 7am to 6pm - Pager - [phone]  After 6pm go to [email or web address] - password [email or web address] [facility]  Office  [phone]  CC: Primary care physician; Raelyn Number, MD  Note: This dictation was prepared with Dragon dictation along with smaller phrase technology. Any transcriptional errors that result from this process are unintentional.

## 2017-04-03 MED ORDER — FUROSEMIDE 40 MG PO TABS
40.0000 mg | ORAL_TABLET | Freq: Every day | ORAL | Status: DC
  Administered 2017-04-03 – 2017-04-06 (×4): 40 mg via ORAL
  Filled 2017-04-03 (×4): qty 1

## 2017-04-03 NOTE — Progress Notes (Addendum)
ASPYNN, CLOVER ([id number]) Visit Report for [name]/14/2018 Arrival Information Details Patient Name: [name] [name], [name] [name]. Date of Service: [name]/14/2018 11:15 AM Medical Record Number: [id number] Patient Account Number: [id number] Date of Birth/Sex: July 07, 1923 (83 y.o. Female) Treating RN: Afful, RN, BSN, [facility] Primary Care Jermain Curt: Celedonio Miyamoto Other Clinician: Referring Yostin Malacara: Celedonio Miyamoto Treating Janelly Switalski/Extender: Frann Rider in Treatment: 7 Visit Information History Since Last Visit All ordered tests and consults were completed: No Patient Arrived: Wheel Chair Added or deleted any medications: No Arrival Time: 11:12 Any new allergies or adverse reactions: No Accompanied By: caregiver Had a fall or experienced change in No Transfer Assistance: None activities of daily living that may affect Patient Identification Verified: Yes risk of falls: Secondary Verification Process Yes Signs or symptoms of abuse/neglect since last No Completed: visito Patient Requires Transmission- No Hospitalized since last visit: No Based Precautions: Has Dressing in Place as Prescribed: Yes Patient Has Alerts: Yes Pain Present Now: No Patient Alerts: Patient on Blood Thinner Plavix Electronic Signature(s) Signed: 04/01/2017 5:35:17 PM By: Regan Lemming BSN, RN Entered By: Regan Lemming on 04/01/2017 11:12:50 [name] [name] ([id number]) -------------------------------------------------------------------------------- Encounter Discharge Information Details Patient Name: [name] [name] Date of Service: [name]/14/2018 11:15 AM Medical Record Number: [id number] Patient Account Number: [id number] Date of Birth/Sex: 1923/09/26 (83 y.o. Female) Treating RN: Baruch Gouty, RN, BSN, Vel[facility] Harman Primary Care Ayde Record: Celedonio Miyamoto Other Clinician: Referring Paydon Carll: Celedonio Miyamoto Treating Cassie Henkels/Extender: Frann Rider in Treatment: 7 Encounter Discharge Information Items Discharge Pain Level: 0 Discharge  Condition: Stable Ambulatory Status: Wheelchair Discharge Destination: Nursing Home Transportation: Private Auto Accompanied By: caregiver Schedule Follow-up Appointment: No Medication Reconciliation completed and provided to Patient/Care No Yaneli Keithley: Provided on Clinical Summary of Care: [name]/14/2018 Form Type Recipient Paper Patient DB Electronic Signature(s) Signed: 04/01/2017 5:35:17 PM By: Regan Lemming BSN, RN Previous Signature: [name]/14/2018 11:45:26 AM Version By: Ruthine Dose Entered By: Regan Lemming on 04/01/2017 11:48:54 [name] [name] ([id number]) -------------------------------------------------------------------------------- Lower Extremity Assessment Details Patient Name: [name] [name] Date of Service: [name]/14/2018 11:15 AM Medical Record Number: [id number] Patient Account Number: [id number] Date of Birth/Sex: 11-Jun-1923 (83 y.o. Female) Treating RN: Afful, RN, BSN, Velva Harman Primary Care Abuk Selleck: Celedonio Miyamoto Other Clinician: Referring Claudy Abdallah: Celedonio Miyamoto Treating Trenesha Alcaide/Extender: Frann Rider in Treatment: 7 Vascular Assessment Claudication: Claudication Assessment [Left:None] [Right:None] Pulses: Dorsalis Pedis Palpable: [Left:No] [Right:No] Doppler Audible: [Left:Yes] [Right:Yes] Posterior Tibial Extremity colors, hair growth, and conditions: Extremity Color: [Left:Mottled] [Right:Mottled] Hair Growth on Extremity: [Left:No] [Right:No] Temperature of Extremity: [Left:Warm] [Right:Warm] Capillary Refill: [Left:< 3 seconds] [Right:< 3 seconds] Toe Nail Assessment Left: Right: Thick: Yes Yes Discolored: Yes Yes Deformed: Yes Yes Improper Length and Hygiene: Yes Yes Electronic Signature(s) Signed: 04/01/2017 5:35:17 PM By: Regan Lemming BSN, RN Entered By: Regan Lemming on 04/01/2017 11:32:03 [name] [name] ([id number]) -------------------------------------------------------------------------------- Multi Wound Chart Details Patient Name: [name] [name] Date of Service: [name]/14/2018 11:15 AM Medical Record Number: [id number] Patient Account Number: [id number] Date of Birth/Sex: 18-Jun-1923 (83 y.o. Female) Treating RN: Baruch Gouty, RN, BSN, [place] Primary Care Joshuah Minella: Celedonio Miyamoto Other Clinician: Referring Mallika Sanmiguel: Celedonio Miyamoto Treating Lankford Gutzmer/Extender: Frann Rider in Treatment: 7 Vital Signs Height(in): 66 Pulse(bpm): 81 Weight(lbs): 144 Blood Pressure 127/46 (mmHg): Body Mass Index(BMI): 23 Temperature(F): 97.8 Respiratory Rate 17 (breaths/min): Photos: [1:No Photos] [4:No Photos] [5:No Photos] Wound Location: [1:Left Foot - Dorsal] [4:Right Coccyx] [5:Right Toe Second - Dorsal] Wounding Event: [1:Gradually Appeared] [4:Pressure Injury] [5:Gradually Appeared] 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] [5:Pressure Ulcer] Comorbid History: [1:Anemia, Congestive Heart Anemia, Congestive  Heart Anemia, Congestive Heart Failure, Coronary Artery Failure, Coronary Artery Failure, Coronary Artery Disease, Hypertension, Peripheral Venous Disease, Neuropathy] [4:Disease,  Hypertension, Peripheral Venous Disease, Neuropathy] [5:Disease, Hypertension, Peripheral Venous Disease, Neuropathy] Date Acquired: [1:02/08/2017] [4:01/18/2017] [5:02/08/2017] Weeks of Treatment: [1:7] [4:7] [5:4] Wound Status: [1:Open] [4:Open] [5:Open] Measurements L x W x D 2x2.1x0.2 [4:0.4x0.8x0.1] [5:0.8x0.7x0.2] (cm) Area (cm) : [1:3.299] [4:0.251] [5:0.44] Volume (cm) : [1:0.66] [4:0.025] [5:0.088] % Reduction in Area: [1:-86.70%] [4:11.30%] [5:48.10%] % Reduction in Volume: -272.90% [4:10.70%] [5:-3.50%] Classification: [1:Partial Thickness] [4:Category/Stage II] [5:Category/Stage II] Exudate Amount: [1:Large] [4:Medium] [5:Large] Exudate Type: [1:Serosanguineous] [4:Serous] [5:Serous] Exudate Color: [1:red, brown] [4:amber] [5:amber] Wound Margin: [1:Distinct, outline attached Distinct, outline attached Distinct, outline  attached] Granulation Amount: [1:None Present (0%)] [4:Medium (34-66%)] [5:Small (1-33%)] Granulation Quality: [1:N/A] [4:Pale] [5:Red] Necrotic Amount: [1:Large (67-100%)] [4:Medium (34-66%)] [5:Large (67-100%)] Exposed Structures: [1:Fat Layer (Subcutaneous Fat Layer (Subcutaneous N/A Tissue) Exposed: Yes] [4:Tissue) Exposed: Yes] Epithelialization: [1:None] [4:Large (67-100%)] [5:None] Debridement: [4:N/A] Debridement ([id number]- Debridement ([id number]) 11047) Pre-procedure 11:29 N/A 11:29 Verification/Time Out Taken: Pain Control: Lidocaine 4% Topical N/A Lidocaine 4% Topical Solution Solution Tissue Debrided: Fibrin/Slough, Fat, N/A Fibrin/Slough, Fat, Exudates, Subcutaneous Exudates, Subcutaneous Level: Skin/Subcutaneous N/A Skin/Subcutaneous Tissue Tissue Debridement Area (sq 4.2 N/A 0.56 cm): Instrument: Curette N/A Curette Bleeding: Minimum N/A Minimum Hemostasis Achieved: Pressure N/A Pressure Procedural Pain: 0 N/A 0 Post Procedural Pain: 0 N/A 0 Debridement Treatment Procedure was tolerated N/A Procedure was tolerated Response: well well Post Debridement 2x2.1x0.2 N/A 0.8x0.7x0.2 Measurements L x W x D (cm) Post Debridement 0.66 N/A 0.088 Volume: (cm) Post Debridement N/A N/A Category/Stage II Stage: Periwound Skin Texture: No Abnormalities Noted Excoriation: No No Abnormalities Noted Induration: No Callus: No Crepitus: No Rash: No Scarring: No Periwound Skin Maceration: Yes Maceration: No Maceration: Yes Moisture: Dry/Scaly: No Periwound Skin Color: Mottled: Yes Hemosiderin Staining: Yes Hemosiderin Staining: Yes Mottled: Yes Mottled: Yes Atrophie Blanche: No Cyanosis: No Ecchymosis: No Erythema: No Pallor: No Rubor: No Temperature: No Abnormality No Abnormality No Abnormality Tenderness on Yes Yes Yes Palpation: Wound Preparation: Ulcer Cleansing: Ulcer Cleansing: Ulcer Cleansing: Rinsed/Irrigated with Rinsed/Irrigated with  Rinsed/Irrigated with Saline Saline Saline Centrella, Layia O. ([id number]) Topical Anesthetic Topical Anesthetic Topical Anesthetic Applied: Other: lidocaine Applied: Other: lidocaine Applied: Other: lidocaine 4% 4% 4% Procedures Performed: Debridement N/A Debridement Treatment Notes Electronic Signature(s) Signed: 04/01/2017 11:36:52 AM By: Christin Fudge MD, FACS Entered By: Christin Fudge on 04/01/2017 11:36:52 [name] [name] ([id number]) -------------------------------------------------------------------------------- [place] Details Patient Name: [name] [name], [name] [name]. Date of Service: [name]/14/2018 11:15 AM Medical Record Number: [id number] Patient Account Number: [id number] Date of Birth/Sex: 04/24/1923 (83 y.o. Female) Treating RN: Afful, RN, BSN, [employer] Primary Care Avanell Banwart: Celedonio Miyamoto Other Clinician: Referring Laddie Naeem: Celedonio Miyamoto Treating Maricel Swartzendruber/Extender: Frann Rider in Treatment: 7 Active Inactive Electronic Signature(s) Signed: 04/14/2017 2:47:17 PM By: Gretta Cool, BSN, RN, CWS, Kim RN, BSN Signed: 04/15/2017 3:16:06 PM By: Regan Lemming BSN, RN Previous Signature: [name]/14/2018 5:35:17 PM Version By: Regan Lemming BSN, RN Entered By: Gretta Cool, BSN, RN, CWS, Kim on 04/14/2017 14:47:15 [name] [name] ([id number]) -------------------------------------------------------------------------------- Pain Assessment Details Patient Name: [name] [name], [name] [name]. Date of Service: [name]/14/2018 11:15 AM Medical Record Number: [id number] Patient Account Number: [id number] Date of Birth/Sex: 17-Oct-1923 (83 y.o. Female) Treating RN: Baruch Gouty, RN, BSN, Velva Harman Primary Care Kameah Rawl: Celedonio Miyamoto Other Clinician: Referring Jameir Ake: Celedonio Miyamoto Treating Zayanna Pundt/Extender: Frann Rider in Treatment: 7 Active Problems Location of Pain Severity and Description of Pain Patient Has Paino No Site Locations With Dressing Change: No Pain Management and Medication  Current Pain  Management: Electronic Signature(s) Signed: 04/01/2017 5:35:17 PM By: Regan Lemming BSN, RN Entered By: Regan Lemming on 04/01/2017 11:12:58 [name] [name] ([id number]) -------------------------------------------------------------------------------- Patient/Caregiver Education Details Patient Name: [name] [name] Date of Service: [name]/14/2018 11:15 AM Medical Record Number: [id number] Patient Account Number: [id number] Date of Birth/Gender: 01-Jul-1923 (83 y.o. Female) Treating RN: Afful, RN, BSN, Velva Harman Primary Care Physician: Celedonio Miyamoto Other Clinician: Referring Physician: Celedonio Miyamoto Treating Physician/Extender: Frann Rider in Treatment: 7 Education Assessment Education Provided To: Patient Education Topics Provided Welcome To [facility]: Methods: Explain/Verbal Electronic Signature(s) Signed: 04/01/2017 5:35:17 PM By: Regan Lemming BSN, RN Entered By: Regan Lemming on 04/01/2017 11:49:23 [name] [name] ([id number]) -------------------------------------------------------------------------------- Wound Assessment Details Patient Name: [name] [name] Date of Service: [name]/14/2018 11:15 AM Medical Record Number: [id number] Patient Account Number: [id number] Date of Birth/Sex: Feb 08, 1923 (83 y.o. Female) Treating RN: Afful, RN, BSN, [place] Primary Care Kayliah Tindol: Celedonio Miyamoto Other Clinician: Referring Akiva Brassfield: Celedonio Miyamoto Treating Ellary Casamento/Extender: Frann Rider in Treatment: 7 Wound Status Wound Number: 1 Primary Arterial Insufficiency Ulcer Etiology: Wound Location: Left Foot - Dorsal Wound Open Wounding Event: Gradually Appeared Status: Date Acquired: 02/08/2017 Comorbid Anemia, Congestive Heart Failure, Weeks Of Treatment: 7 History: Coronary Artery Disease, Hypertension, Clustered Wound: No Peripheral Venous Disease, Neuropathy Photos Photo Uploaded By: Regan Lemming on 04/01/2017 15:30:11 Wound Measurements Length: (cm) 2 % Reduction i Width:  (cm) 2.1 % Reduction i Depth: (cm) 0.2 Epithelializa Area: (cm) 3.299 Tunneling: Volume: (cm) 0.66 Undermining: n Area: -86.7% n Volume: -272.9% tion: None No No Wound Description Classification: Partial Thickness Foul Odor Aft Wound Margin: Distinct, outline attached Slough/Fibrin Exudate Amount: Large Exudate Type: Serosanguineous [name] [name], [name] [name] ([id number]) er Cleansing: No o Yes Exudate Color: red, brown Wound Bed Granulation Amount: None Present (0%) Exposed Structure Necrotic Amount: Large (67-100%) Fat Layer (Subcutaneous Tissue) Exposed: Yes Necrotic Quality: Adherent Slough Periwound Skin Texture Texture Color No Abnormalities Noted: No No Abnormalities Noted: No Mottled: Yes Moisture No Abnormalities Noted: No Temperature / Pain Maceration: Yes Temperature: No Abnormality Tenderness on Palpation: Yes Wound Preparation Ulcer Cleansing: Rinsed/Irrigated with Saline Topical Anesthetic Applied: Other: lidocaine 4%, Electronic Signature(s) Signed: 04/01/2017 5:35:17 PM By: Regan Lemming BSN, RN Entered By: Regan Lemming on 04/01/2017 11:29:54 [name] [name] ([id number]) -------------------------------------------------------------------------------- Wound Assessment Details Patient Name: [name] [name], [name] [name]. Date of Service: [name]/14/2018 11:15 AM Medical Record Number: [id number] Patient Account Number: [id number] Date of Birth/Sex: [birth year] (83 y.o. Female) Treating RN: Afful, RN, BSN, [place] Primary Care Nikoletta Varma: Celedonio Miyamoto Other Clinician: Referring Nguyen Butler: Celedonio Miyamoto Treating Kaisen Ackers/Extender: Frann Rider in Treatment: 7 Wound Status Wound Number: 4 Primary Pressure Ulcer Etiology: Wound Location: Right Coccyx Wound Open Wounding Event: Pressure Injury Status: Date Acquired: 01/18/2017 Comorbid Anemia, Congestive Heart Failure, Weeks Of Treatment: 7 History: Coronary Artery Disease, Hypertension, Clustered Wound: No Peripheral Venous  Disease, Neuropathy Photos Photo Uploaded By: Regan Lemming on 04/01/2017 15:30:12 Wound Measurements Length: (cm) 0.4 Width: (cm) 0.8 Depth: (cm) 0.1 Area: (cm) 0.251 Volume: (cm) 0.025 % Reduction in Area: 11.3% % Reduction in Volume: 10.7% Epithelialization: Large (67-100%) Tunneling: No Undermining: No Wound Description Classification: Category/Stage II Foul Odor Aft Wound Margin: Distinct, outline attached Slough/Fibrin Exudate Amount: Medium Exudate Type: Serous Exudate Color: amber er Cleansing: No o Yes Wound Bed Granulation Amount: Medium (34-66%) Exposed Structure Granulation Quality: Pale Fat Layer (Subcutaneous Tissue) Exposed: Yes Necrotic Amount: Medium (34-66%) Necrotic Quality: [name] [address] [name] O. ([id number]) Periwound Skin Texture Texture Color No Abnormalities Noted: No  No Abnormalities Noted: No Callus: No Atrophie Blanche: No Crepitus: No Cyanosis: No Excoriation: No Ecchymosis: No Induration: No Erythema: No Rash: No Hemosiderin Staining: Yes Scarring: No Mottled: Yes Pallor: No Moisture Rubor: No No Abnormalities Noted: No Dry / Scaly: No Temperature / Pain Maceration: No Temperature: No Abnormality Tenderness on Palpation: Yes Wound Preparation Ulcer Cleansing: Rinsed/Irrigated with Saline Topical Anesthetic Applied: Other: lidocaine 4%, Electronic Signature(s) Signed: 04/01/2017 5:35:17 PM By: Regan Lemming BSN, RN Entered By: Regan Lemming on 04/01/2017 11:30:30 [name] [name] ([id number]) -------------------------------------------------------------------------------- Wound Assessment Details Patient Name: [name] [name] Date of Service: [name]/14/2018 11:15 AM Medical Record Number: [id number] Patient Account Number: [id number] Date of Birth/Sex: August 01, 1923 (83 y.o. Female) Treating RN: Afful, RN, BSN, [place] Primary Care Keyandra Swenson: Celedonio Miyamoto Other Clinician: Referring Milano Rosevear: Celedonio Miyamoto Treating  Dewan Emond/Extender: Frann Rider in Treatment: 7 Wound Status Wound Number: 5 Primary Pressure Ulcer Etiology: Wound Location: Right Toe Second - Dorsal Wound Open Wounding Event: Gradually Appeared Status: Date Acquired: 02/08/2017 Comorbid Anemia, Congestive Heart Failure, Weeks Of Treatment: 4 History: Coronary Artery Disease, Hypertension, Clustered Wound: No Peripheral Venous Disease, Neuropathy Photos Photo Uploaded By: Regan Lemming on 04/01/2017 15:41:27 Wound Measurements Length: (cm) 0.8 Width: (cm) 0.7 Depth: (cm) 0.2 Area: (cm) 0.44 Volume: (cm) 0.088 % Reduction in Area: 48.1% % Reduction in Volume: -3.5% Epithelialization: None Tunneling: No Undermining: No Wound Description Classification: Category/Stage II Foul Odor Aft Wound Margin: Distinct, outline attached Slough/Fibrin Exudate Amount: Large Exudate Type: Serous Exudate Color: amber er Cleansing: No o Yes Wound Bed Granulation Amount: Small (1-33%) Granulation Quality: Red Necrotic Amount: Large (67-100%) Necrotic Quality: Adherent [name] [name] O. ([id number]) Periwound Skin Texture Texture Color No Abnormalities Noted: No No Abnormalities Noted: No Hemosiderin Staining: Yes Moisture Mottled: Yes No Abnormalities Noted: No Maceration: Yes Temperature / Pain Temperature: No Abnormality Tenderness on Palpation: Yes Wound Preparation Ulcer Cleansing: Rinsed/Irrigated with Saline Topical Anesthetic Applied: Other: lidocaine 4%, Electronic Signature(s) Signed: 04/01/2017 5:35:17 PM By: Regan Lemming BSN, RN Entered By: Regan Lemming on 04/01/2017 11:31:21 [name] [name] ([id number]) -------------------------------------------------------------------------------- Vitals Details Patient Name: [name] [name] Date of Service: [name]/14/2018 11:15 AM Medical Record Number: [id number] Patient Account Number: [id number] Date of Birth/Sex: 04-24-1923 (83 y.o. Female) Treating RN: Afful,  RN, BSN, [place] Primary Care Kuulei Kleier: Celedonio Miyamoto Other Clinician: Referring Ilo Beamon: Celedonio Miyamoto Treating Reshma Hoey/Extender: Frann Rider in Treatment: 7 Vital Signs Time Taken: 11:15 Temperature (F): 97.8 Height (in): 66 Pulse (bpm): 81 Weight (lbs): 144 Respiratory Rate (breaths/min): 17 Body Mass Index (BMI): 23.2 Blood Pressure (mmHg): 127/46 Reference Range: 80 - 120 mg / dl Electronic Signature(s) Signed: 04/01/2017 5:35:17 PM By: Regan Lemming BSN, RN Entered By: Regan Lemming on 04/01/2017 11:15:11

## 2017-04-03 NOTE — Progress Notes (Signed)
[facility] [place] NAME: [name] [name]    MR#:  [id number]  DATE OF BIRTH:  25-Oct-1922  SUBJECTIVE:  CHIEF COMPLAINT:   Chief Complaint  Patient presents with  . Shortness of Breath   -feels weak, very congested, doesn't fell like she can go back to ALF - also significant cough  REVIEW OF SYSTEMS:  Review of Systems  Constitutional: Positive for malaise/fatigue. Negative for chills and fever.  HENT: Positive for hearing loss. Negative for congestion, ear discharge and nosebleeds.   Eyes: Negative for blurred vision and double vision.  Respiratory: Positive for cough and shortness of breath. Negative for wheezing.   Cardiovascular: Negative for chest pain, palpitations and leg swelling.  Gastrointestinal: Negative for abdominal pain, constipation, diarrhea, nausea and vomiting.  Genitourinary: Negative for dysuria.  Musculoskeletal: Negative for myalgias.  Neurological: Positive for weakness. Negative for dizziness, sensory change, speech change, focal weakness, seizures and headaches.  Psychiatric/Behavioral: Negative for depression.    DRUG ALLERGIES:   Allergies  Allergen Reactions  . Cholesterol Other (See Comments)    Patient states she is allergic to a cholesterol medication, which causes flu-like symptoms. Name is unknown.  . Morphine And Related     Heart problems   . Zoloft [Sertraline Hcl] Swelling    VITALS:  Blood pressure (!) 136/58, pulse 94, temperature 98.3 F (36.8 C), temperature source Oral, resp. rate 20, height 5\' 6"  (1.676 m), weight 65 kg (143 lb 4.8 oz), SpO2 96 %.  PHYSICAL EXAMINATION:  Physical Exam  GENERAL:  83 y.o.-year-old patient lying in the bed with no acute distress. Has a thick cough  EYES: Pupils equal, round, reactive to light and accommodation. No scleral icterus. Extraocular muscles intact.  HEENT: Head atraumatic, normocephalic. Oropharynx and nasopharynx clear.  NECK:  Supple, no  jugular venous distention. No thyroid enlargement, no tenderness.  LUNGS: Normal breath sounds bilaterally, no wheezing, rales or crepitation. Some upper airway scattered rhonchi present. No use of accessory muscles of respiration.  CARDIOVASCULAR: S1, S2 normal. No  rubs, or gallops. 3/6 systolic murmur present ABDOMEN: Soft, nontender, nondistended. Bowel sounds present. No organomegaly or mass.  EXTREMITIES: No pedal edema, cyanosis, or clubbing.  NEUROLOGIC: Cranial nerves II through XII are intact. Muscle strength 5/5 in all extremities. Sensation intact. Gait not checked. Global weakness noted. PSYCHIATRIC: The patient is alert and oriented x 3.  SKIN: No obvious rash, lesion, or ulcer.    LABORATORY PANEL:   CBC  Recent Labs Lab 04/02/17 0722  WBC 20.6*  HGB 8.2*  HCT 24.8*  PLT 193   ------------------------------------------------------------------------------------------------------------------  Chemistries   Recent Labs Lab 04/02/17 0722  NA 133*  K 4.1  CL 96*  CO2 28  GLUCOSE 179*  BUN 53*  CREATININE 2.01*  CALCIUM 8.8*   ------------------------------------------------------------------------------------------------------------------  Cardiac Enzymes  Recent Labs Lab 04/02/17 0722  TROPONINI <0.03   ------------------------------------------------------------------------------------------------------------------  RADIOLOGY:  No results found.  EKG:   Orders placed or performed during the hospital encounter of 04/01/17  . ED EKG  . ED EKG  . EKG 12-Lead  . EKG 12-Lead    ASSESSMENT AND PLAN:   83 y/o F with PMH significant for COPD, CHF, chronic home o2 of 3L, CAD, HTN, presents from [facility] for shortness of breath and cough.  #1 Acute on chronic diastolic CHF- started on IV lasix, diuresing well Monitor on tele Change lasix to oral today  #2 COPD exacerbation with bronchitis-  still has upper airway congestion- mucinex, add  flutter valve and chest percussion if needed On levaquin, nebs and inhalers  #3 HTN- continue home meds- on coreg  #4 CKD stage 4-stable, monitor with diuresis  #5 Generalized weakness- PT consulted, recommended HHPT, but patient interested in SNF which is appropriate as needing a lot of assistance  #6 Sacral pressure ulcer- appreciate wound RN consult, continue dressing changes per recommendations  #6 DVT Prophylaxis- on lovenox   Social worker consult   All the records are reviewed and case discussed with Care Management/Social Workerr. Management plans discussed with the patient, family and they are in agreement.  CODE STATUS: Full Code  TOTAL TIME TAKING CARE OF THIS PATIENT: 38 minutes.   POSSIBLE D/C IN 1-2 DAYS, DEPENDING ON CLINICAL CONDITION.   Gladstone Lighter M.D on 04/03/2017 at 4:55 PM  Between 7am to 6pm - Pager - [phone]  After 6pm go to [email or web address] - password [email or web address] [facility]  Office  [phone]  CC: Primary care physician; Raelyn Number, MD

## 2017-04-03 NOTE — Progress Notes (Signed)
[name] [name] ([id number]) Visit Report for 04/01/2017 Chief Complaint Document Details Patient Name: [name] [name], [name] [name]. Date of Service: 04/01/2017 11:15 AM Medical Record Number: [id number] Patient Account Number: [id number] Date of Birth/Sex: [birth year] (83 y.o. Female) Treating RN: Afful, RN, BSN, Velva [facility]man Primary Care Provider: Celedonio Miyamoto Other Clinician: Referring Provider: Celedonio Miyamoto Treating Provider/Extender: Frann Rider in Treatment: 7 Information Obtained from: Patient Chief Complaint Patient is at the clinic for treatment of an open pressure ulcer to her left gluteal area and other wounds on her left lower leg and foot, for 3 weeks now Electronic Signature(s) Signed: 04/01/2017 11:37:19 AM By: Christin Fudge MD, FACS Entered By: Christin Fudge on 04/01/2017 11:37:19 [name] [name] ([id number]) -------------------------------------------------------------------------------- Debridement Details Patient Name: [name] [name] Date of Service: 04/01/2017 11:15 AM Medical Record Number: [id number] Patient Account Number: [id number] Date of Birth/Sex: 01-24-1923 (83 y.o. Female) Treating RN: Afful, RN, BSN, [place] Primary Care Provider: Celedonio Miyamoto Other Clinician: Referring Provider: Celedonio Miyamoto Treating Provider/Extender: Frann Rider in Treatment: 7 Debridement Performed for Wound #1 Left,Dorsal Foot Assessment: Performed By: Physician Christin Fudge, MD Debridement: Debridement Severity of Tissue Pre Fat layer exposed Debridement: Pre-procedure Verification/Time Out Yes - 11:29 Taken: Start Time: 11:29 Pain Control: Lidocaine 4% Topical Solution Level: Skin/Subcutaneous Tissue Total Area Debrided (L x 2 (cm) x 2.1 (cm) = 4.2 (cm) W): Tissue and other Non-Viable, Exudate, Fat, Fibrin/Slough, Subcutaneous material debrided: Instrument: Curette Bleeding: Minimum Hemostasis Achieved: Pressure End Time: 11:32 Procedural Pain: 0 Post Procedural  Pain: 0 Response to Treatment: Procedure was tolerated well Post Debridement Measurements of Total Wound Length: (cm) 2 Width: (cm) 2.1 Depth: (cm) 0.2 Volume: (cm) 0.66 Character of Wound/Ulcer Post Requires Further Debridement Debridement: Severity of Tissue Post Debridement: Fat layer exposed Post Procedure Diagnosis Same as Pre-procedure Electronic Signature(s) Signed: 04/01/2017 11:37:03 AM By: Christin Fudge MD, FACS Signed: 04/01/2017 5:35:17 PM By: Regan Lemming BSN, RN [place], Niki Jenetta Downer ([id number]) Entered By: Christin Fudge on 04/01/2017 11:37:02 [name] [name], [name] [name] ([id number]) -------------------------------------------------------------------------------- Debridement Details Patient Name: [name] [name], [name] [name]. Date of Service: 04/01/2017 11:15 AM Medical Record Number: [id number] Patient Account Number: [id number] Date of Birth/Sex: 1923/10/14 (83 y.o. Female) Treating RN: Afful, RN, BSN, [place] Primary Care Provider: Celedonio Miyamoto Other Clinician: Referring Provider: Celedonio Miyamoto Treating Provider/Extender: Frann Rider in Treatment: 7 Debridement Performed for Wound #5 Right,Dorsal Toe Second Assessment: Performed By: Physician Christin Fudge, MD Debridement: Debridement Pre-procedure Verification/Time Out Yes - 11:29 Taken: Start Time: 11:29 Pain Control: Lidocaine 4% Topical Solution Level: Skin/Subcutaneous Tissue Total Area Debrided (L x 0.8 (cm) x 0.7 (cm) = 0.56 (cm) W): Tissue and other Non-Viable, Exudate, Fat, Fibrin/Slough, Subcutaneous material debrided: Instrument: Curette Bleeding: Minimum Hemostasis Achieved: Pressure End Time: 11:32 Procedural Pain: 0 Post Procedural Pain: 0 Response to Treatment: Procedure was tolerated well Post Debridement Measurements of Total Wound Length: (cm) 0.8 Stage: Category/Stage II Width: (cm) 0.7 Depth: (cm) 0.2 Volume: (cm) 0.088 Character of Wound/Ulcer Post Stable Debridement: Post Procedure  Diagnosis Same as Pre-procedure Electronic Signature(s) Signed: 04/01/2017 11:37:12 AM By: Christin Fudge MD, FACS Signed: 04/01/2017 5:35:17 PM By: Regan Lemming BSN, RN Entered By: Christin Fudge on 04/01/2017 11:37:11 [name] [name] ([id number]) -------------------------------------------------------------------------------- HPI Details Patient Name: [name] [name] Date of Service: 04/01/2017 11:15 AM Medical Record Number: [id number] Patient Account Number: [id number] Date of Birth/Sex: 1923/01/11 (83 y.o. Female) Treating RN: Afful, RN, BSN, [employer] Primary Care Provider: Celedonio Miyamoto Other Clinician: Referring Provider: Celedonio Miyamoto Treating Provider/Extender: Frann Rider in Treatment: 7  History of Present Illness Location: left gluteal area, left foot, left lower extremity Quality: Patient reports experiencing a sharp pain to affected area(s). Severity: Patient states wound are getting worse. Duration: Patient has had the wound for < 3 weeks prior to presenting for treatment Timing: Pain in wound is Intermittent (comes and goes Context: The wound appeared gradually over time Modifying Factors: Other treatment(s) tried include:local care with Unna boots Associated Signs and Symptoms: Patient reports having increase swelling. HPI Description: 83 year old patient referred to as by her nurse sent [facility] for ulcers both legs,decubitus ulcer on the sacrum which she's had for about 3 weeks. The daughter is at the bedside does not know how these occurred and there was no fall or trauma Her past medical history is suggestive of chronic diastolic heart failure, hypertension, peripheral arterial disease, chronic venous insufficiency, generalized anxiety disorder, and has never been a smoker. She is also status post appendectomy, cholecystectomy, hysterectomy and left hip replacement. She is also had a fracture of her left humerus in November 2017 and has been treated  nonsurgically with a Sarmiento brace. 03/01/2017 -- since she was seen here the last time she has been admitted to the hospital between May 3 and 02/21/2017 with COPD exacerbation and pressure injuries to the skin. she was treated with doxycycline as she had an MRSA positive sputum but had no pneumonia. She was also diuresed with IV Lasix and this helped his CHF. 03/08/2017 -- she has improved markedly and her general health and overall is looking more positive Electronic Signature(s) Signed: 04/01/2017 11:37:25 AM By: Christin Fudge MD, FACS Entered By: Christin Fudge on 04/01/2017 11:37:25 [name] [name] ([id number]) -------------------------------------------------------------------------------- Physical Exam Details Patient Name: [name] [name] Date of Service: 04/01/2017 11:15 AM Medical Record Number: [id number] Patient Account Number: [id number] Date of Birth/Sex: 1923-07-27 (83 y.o. Female) Treating RN: Baruch Gouty, RN, BSN, [place] Primary Care Provider: Celedonio Miyamoto Other Clinician: Referring Provider: Celedonio Miyamoto Treating Provider/Extender: Frann Rider in Treatment: 7 Constitutional . Pulse regular. Respirations normal and unlabored. Afebrile. . Eyes Nonicteric. Reactive to light. Ears, Nose, Mouth, and Throat Lips, teeth, and gums WNL.Marland Kitchen Moist mucosa without lesions. Neck supple and nontender. No palpable supraclavicular or cervical adenopathy. Normal sized without goiter. Respiratory WNL. No retractions.. Breath sounds WNL, No rubs, rales, rhonchi, or wheeze.. Cardiovascular Heart rhythm and rate regular, no murmur or gallop.. Pedal Pulses WNL. No clubbing, cyanosis or edema. Chest Breasts symmetical and no nipple discharge.. Breast tissue WNL, no masses, lumps, or tenderness.. Lymphatic No adneopathy. No adenopathy. No adenopathy. Musculoskeletal Adexa without tenderness or enlargement.. Digits and nails w/o clubbing, cyanosis, infection, petechiae, ischemia, or  inflammatory conditions.. Integumentary (Hair, Skin) No suspicious lesions. No crepitus or fluctuance. No peri-wound warmth or erythema. No masses.Marland Kitchen Psychiatric Judgement and insight Intact.. No evidence of depression, anxiety, or agitation.. Notes the right gluteal wound is looking good and did not need any sharp debridement. The wound on the dorsum of the left foot and the right second toe. A Chapter debridement with a #3 curet and minimal bleeding controlled with pressure. The right toe dorsum is probing down to bone. Electronic Signature(s) Signed: 04/01/2017 11:38:03 AM By: Christin Fudge MD, FACS Entered By: Christin Fudge on 04/01/2017 11:38:03 [name] [name] ([id number]) -------------------------------------------------------------------------------- Physician Orders Details Patient Name: JAYLEENA, STILLE Date of Service: 04/01/2017 11:15 AM Medical Record Number: [id number] Patient Account Number: [id number] Date of Birth/Sex: March 28, 1923 (83 y.o. Female) Treating RN: Afful, RN, BSN, [facility] Primary Care Provider: Celedonio Miyamoto  Other Clinician: Referring Provider: Celedonio Miyamoto Treating Provider/Extender: Frann Rider in Treatment: 7 Verbal / Phone Orders: No Diagnosis Coding Wound Cleansing Wound #1 Left,Dorsal Foot o Clean wound with Normal Saline. o Cleanse wound with mild soap and water Wound #4 Right Coccyx o Clean wound with Normal Saline. o Cleanse wound with mild soap and water Wound #5 Right,Dorsal Toe Second o Clean wound with Normal Saline. o Cleanse wound with mild soap and water Anesthetic Wound #1 Left,Dorsal Foot o Topical Lidocaine 4% cream applied to wound bed prior to debridement - for clinic use Wound #4 Right Coccyx o Topical Lidocaine 4% cream applied to wound bed prior to debridement - for clinic use Wound #5 Right,Dorsal Toe Second o Topical Lidocaine 4% cream applied to wound bed prior to debridement - for clinic use Skin  Barriers/Peri-Wound Care Wound #4 Right Coccyx o Skin Prep Primary Wound Dressing Wound #1 Left,Dorsal Foot o Santyl Ointment Wound #4 Right Coccyx o Aquacel Ag - or equivalent to Wound #5 Right,Dorsal Toe Second [address] [place], [place] ([id number]) Secondary Dressing Wound #1 Left,Dorsal Foot o ABD pad o Dry Gauze o Gauze and Kerlix/Conform Wound #4 Right Coccyx o Boardered Foam Dressing Wound #5 Right,Dorsal Toe Second o Dry Gauze o Conform/Kerlix Dressing Change Frequency Wound #1 Left,Dorsal Foot o Change dressing every other day. Wound #4 Right Coccyx o Change dressing every other day. Wound #5 Right,Dorsal Toe Second o Change dressing every other day. Follow-up Appointments Wound #1 Left,Dorsal Foot o Return Appointment in 1 week. Wound #4 Right Coccyx o Return Appointment in 1 week. Wound #5 Right,Dorsal Toe Second o Return Appointment in 1 week. Edema Control Wound #1 Left,Dorsal Foot o Elevate legs to the level of the heart and pump ankles as often as possible Wound #5 Right,Dorsal Toe Second o Elevate legs to the level of the heart and pump ankles as often as possible Off-Loading Wound #1 Left,Dorsal Foot o Turn and reposition every 2 hours Wound #4 Right Coccyx o Turn and reposition every 2 hours [name] [name], [name] [name]. ([id number]) Wound #5 Right,Dorsal Toe Second o Turn and reposition every 2 hours Additional Orders / Instructions Wound #1 Left,Dorsal Foot o Increase protein intake. Wound #4 Right Coccyx o Increase protein intake. Wound #5 Right,Dorsal Toe Second o Increase protein intake. Home Health Wound #1 [facility] - [facility] o Home Health Nurse may visit PRN to address patientos wound care needs. o FACE TO FACE ENCOUNTER: MEDICARE and MEDICAID PATIENTS: I certify that this patient is under my care and that I had a face-to-face encounter that  meets the physician face-to-face encounter requirements with this patient on this date. The encounter with the patient was in whole or in part for the following MEDICAL CONDITION: (primary reason for [place]) MEDICAL NECESSITY: I certify, that based on my findings, NURSING services are a medically necessary home health service. HOME BOUND STATUS: I certify that my clinical findings support that this patient is homebound (i.e., Due to illness or injury, pt requires aid of supportive devices such as crutches, cane, wheelchairs, walkers, the use of special transportation or the assistance of another person to leave their place of residence. There is a normal inability to leave the home and doing so requires considerable and taxing effort. Other absences are for medical reasons / religious services and are infrequent or of short duration when for other reasons). o If current dressing causes regression in wound condition, may D/C ordered dressing  product/s and apply Normal Saline Moist Dressing daily until next [place] / Other MD appointment. [place] of regression in wound condition at [phone]. o Please direct any NON-WOUND related issues/requests for orders to patient's Primary Care Physician Wound #4 Right [place] Nurse may visit PRN to address patientos wound care needs. o FACE TO FACE ENCOUNTER: MEDICARE and MEDICAID PATIENTS: I certify that this patient is under my care and that I had a face-to-face encounter that meets the physician face-to-face encounter requirements with this patient on this date. The encounter with the patient was in whole or in part for the following MEDICAL CONDITION: (primary reason for [place]) MEDICAL NECESSITY: I certify, that based on my findings, NURSING services are a medically necessary home health service. HOME BOUND STATUS: I certify that my  clinical findings support that this patient is homebound (i.e., Due to illness or injury, pt requires aid of supportive devices such as crutches, cane, wheelchairs, walkers, the use of special transportation or the assistance of another person to leave their place of residence. There is a normal inability to leave the home and doing so requires considerable and taxing effort. [name] [name], [name] [name] ([id number]) absences are for medical reasons / religious services and are infrequent or of short duration when for other reasons). o If current dressing causes regression in wound condition, may D/C ordered dressing product/s and apply Normal Saline Moist Dressing daily until next [place] / Other MD appointment. [place] of regression in wound condition at [phone]. o Please direct any NON-WOUND related issues/requests for orders to patient's Primary Care Physician Wound #5 Right,Dorsal Toe [place] Nurse may visit PRN to address patientos wound care needs. o FACE TO FACE ENCOUNTER: MEDICARE and MEDICAID PATIENTS: I certify that this patient is under my care and that I had a face-to-face encounter that meets the physician face-to-face encounter requirements with this patient on this date. The encounter with the patient was in whole or in part for the following MEDICAL CONDITION: (primary reason for [place]) MEDICAL NECESSITY: I certify, that based on my findings, NURSING services are a medically necessary home health service. HOME BOUND STATUS: I certify that my clinical findings support that this patient is homebound (i.e., Due to illness or injury, pt requires aid of supportive devices such as crutches, cane, wheelchairs, walkers, the use of special transportation or the assistance of another person to leave their place of residence. There is a normal inability to leave the home and doing  so requires considerable and taxing effort. Other absences are for medical reasons / religious services and are infrequent or of short duration when for other reasons). o If current dressing causes regression in wound condition, may D/C ordered dressing product/s and apply Normal Saline Moist Dressing daily until next [place] / Other MD appointment. [place] of regression in wound condition at [phone]. o Please direct any NON-WOUND related issues/requests for orders to patient's Primary Care Physician Medications-please add to medication list. Wound #1 Left,Dorsal Foot o Santyl Enzymatic Ointment o Other: - Vitamin C, Zinc, MVI Wound #4 Right Coccyx o Other: - Vitamin C, Zinc, MVI Wound #5 Right,Dorsal Toe Second o Santyl Enzymatic Ointment o Other: - Vitamin C, Zinc, MVI Electronic Signature(s) Signed: 04/01/2017 4:29:00 PM By: Christin Fudge MD, FACS Signed: 04/01/2017 5:35:17 PM By: Regan Lemming  BSN, RN Entered By: Regan Lemming on 04/01/2017 11:44:42 [name] [name] ([id number]) -------------------------------------------------------------------------------- Problem List Details Patient Name: ANGELITA, HARNACK Date of Service: 04/01/2017 11:15 AM Medical Record Number: [id number] Patient Account Number: [id number] Date of Birth/Sex: 1922-11-13 (83 y.o. 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 02/11/2017 Yes exposed L97.522 Non-pressure chronic ulcer of other part of left foot with fat 02/11/2017 Yes layer exposed I73.9 Peripheral vascular disease, unspecified 02/11/2017 Yes L97.513  Non-pressure chronic ulcer of other part of right foot with 03/18/2017 Yes necrosis of muscle Inactive Problems Resolved Problems Electronic Signature(s) Signed: 04/01/2017 11:36:47 AM By: Christin Fudge MD, FACS Entered By: Christin Fudge on 04/01/2017 11:36:46 [name] [name] ([id number]) -------------------------------------------------------------------------------- Progress Note Details Patient Name: [name] [name] Date of Service: 04/01/2017 11:15 AM Medical Record Number: [id number] Patient Account Number: [id number] Date of Birth/Sex: 24-Sep-1923 (83 y.o. Female) Treating RN: Afful, RN, BSN, [place] Primary Care Provider: Celedonio Miyamoto Other Clinician: Referring Provider: Celedonio Miyamoto Treating Provider/Extender: Frann Rider in Treatment: 7 Subjective Chief Complaint Information obtained from Patient Patient is at the clinic for treatment of an open pressure ulcer to her left gluteal area and other wounds on her left lower leg and foot, for 3 weeks now History of Present Illness (HPI) The following HPI elements were documented for the patient's wound: Location: left gluteal area, left foot, left lower extremity Quality: Patient reports experiencing a sharp pain to affected area(s). Severity: Patient states wound are getting worse. Duration: Patient has had the wound for < 3 weeks prior to presenting for treatment Timing: Pain in wound is Intermittent (comes and goes Context: The wound appeared gradually over time Modifying Factors: Other treatment(s) tried include:local care with Unna boots Associated Signs and Symptoms: Patient reports having increase swelling. 83 year old patient referred to as by her nurse sent [facility] assisted [facility] for ulcers both legs,decubitus ulcer on the sacrum which she's had for about 3 weeks. The daughter is at the bedside does not know how these occurred and there was no fall or trauma Her past medical history is suggestive of chronic  diastolic heart failure, hypertension, peripheral arterial disease, chronic venous insufficiency, generalized anxiety disorder, and has never been a smoker. She is also status post appendectomy, cholecystectomy, hysterectomy and left hip replacement. She is also had a fracture of her left humerus in November 2017 and has been treated nonsurgically with a Sarmiento brace. 03/01/2017 -- since she was seen here the last time she has been admitted to the hospital between May 3 and 02/21/2017 with COPD exacerbation and pressure injuries to the skin. she was treated with doxycycline as she had an MRSA positive sputum but had no pneumonia. She was also diuresed with IV Lasix and this helped his CHF. 03/08/2017 -- she has improved markedly and her general health and overall is looking more positive Objective Constitutional [place], [place] ([id number]) Pulse regular. Respirations normal and unlabored. Afebrile. Vitals Time Taken: 11:15 AM, Height: 66 in, Weight: 144 lbs, BMI: 23.2, Temperature: 97.8 F, Pulse: 81 bpm, Respiratory Rate: 17 breaths/min, Blood Pressure: 127/46 mmHg. Eyes Nonicteric. Reactive to light. Ears, Nose, Mouth, and Throat Lips, teeth, and gums WNL.Marland Kitchen Moist mucosa without lesions. Neck supple and nontender. No palpable supraclavicular or  cervical adenopathy. Normal sized without goiter. Respiratory WNL. No retractions.. Breath sounds WNL, No rubs, rales, rhonchi, or wheeze.. Cardiovascular Heart rhythm and rate regular, no murmur or gallop.. Pedal Pulses WNL. No clubbing, cyanosis or edema. Chest Breasts symmetical and no nipple discharge.. Breast tissue WNL, no masses, lumps, or tenderness.. Lymphatic No adneopathy. No adenopathy. No adenopathy. Musculoskeletal Adexa without tenderness or enlargement.. Digits and nails w/o clubbing, cyanosis, infection, petechiae, ischemia, or inflammatory conditions.Marland Kitchen Psychiatric Judgement and insight Intact.. No evidence of  depression, anxiety, or agitation.. General Notes: the right gluteal wound is looking good and did not need any sharp debridement. The wound on the dorsum of the left foot and the right second toe. A Chapter debridement with a #3 curet and minimal bleeding controlled with pressure. The right toe dorsum is probing down to bone. Integumentary (Hair, Skin) No suspicious lesions. No crepitus or fluctuance. No peri-wound warmth or erythema. No masses.. Wound #1 status is Open. Original cause of wound was Gradually Appeared. The wound is located on the Left,Dorsal Foot. The wound measures 2cm length x 2.1cm width x 0.2cm depth; 3.299cm^2 area and 0.66cm^3 volume. There is Fat Layer (Subcutaneous Tissue) Exposed exposed. There is no tunneling or undermining noted. There is a large amount of serosanguineous drainage noted. The wound margin is distinct with the outline attached to the wound base. There is no granulation within the wound bed. There is a large (67-100%) amount of necrotic tissue within the wound bed including Adherent Slough. The periwound skin appearance exhibited: Maceration, Mottled. Periwound temperature was noted as No Abnormality. The periwound has tenderness on palpation. [name] [name], [name] [name] ([id number]) Wound #4 status is Open. Original cause of wound was Pressure Injury. The wound is located on the Right Coccyx. The wound measures 0.4cm length x 0.8cm width x 0.1cm depth; 0.251cm^2 area and 0.025cm^3 volume. There is Fat Layer (Subcutaneous Tissue) Exposed exposed. There is no tunneling or undermining noted. There is a medium amount of serous drainage noted. The wound margin is distinct with the outline attached to the wound base. There is medium (34-66%) pale granulation within the wound bed. There is a medium (34-66%) amount of necrotic tissue within the wound bed including Adherent Slough. The periwound skin appearance exhibited: Hemosiderin Staining, Mottled. The periwound skin  appearance did not exhibit: Callus, Crepitus, Excoriation, Induration, Rash, Scarring, Dry/Scaly, Maceration, Atrophie [place], Cyanosis, Ecchymosis, Pallor, Rubor, Erythema. Periwound temperature was noted as No Abnormality. The periwound has tenderness on palpation. Wound #5 status is Open. Original cause of wound was Gradually Appeared. The wound is located on the Right,Dorsal Toe Second. The wound measures 0.8cm length x 0.7cm width x 0.2cm depth; 0.44cm^2 area and 0.088cm^3 volume. There is no tunneling or undermining noted. There is a large amount of serous drainage noted. The wound margin is distinct with the outline attached to the wound base. There is small (1-33%) red granulation within the wound bed. There is a large (67-100%) amount of necrotic tissue within the wound bed including Adherent Slough. The periwound skin appearance exhibited: Maceration, Hemosiderin Staining, Mottled. Periwound temperature was noted as No Abnormality. The periwound has tenderness on palpation. 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 There was a Skin/Subcutaneous Tissue Debridement ([id number]) debridement with total area of 4.2 sq cm performed by Christin Fudge, MD. with the following instrument(s): Curette to remove Non-Viable tissue/material including Exudate, Fat Layer (and Subcutaneous Tissue) Exposed, Fibrin/Slough, and Subcutaneous after achieving pain control using Lidocaine 4%  Topical Solution. A time out was conducted at 11:29, prior to the start of the procedure. A Minimum amount of bleeding was controlled with Pressure. The procedure was tolerated well with a pain level of 0 throughout and a pain level of 0 following the procedure. Post Debridement Measurements: 2cm [name] [name], [name] O. ([id number]) length x 2.1cm width x 0.2cm depth; 0.66cm^3 volume. Character of Wound/Ulcer Post Debridement requires further debridement. Severity of Tissue Post Debridement is: Fat layer exposed. Post procedure Diagnosis Wound #1: Same as Pre-Procedure Wound #5 Pre-procedure diagnosis of Wound #5 is a Pressure Ulcer located on the Right,Dorsal Toe Second . There was a Skin/Subcutaneous Tissue Debridement ([id number]) debridement with total area of 0.56 sq cm performed by Christin Fudge, MD. with the following instrument(s): Curette to remove Non-Viable tissue/material including Exudate, Fat Layer (and Subcutaneous Tissue) Exposed, Fibrin/Slough, and Subcutaneous after achieving pain control using Lidocaine 4% Topical Solution. A time out was conducted at 11:29, prior to the start of the procedure. A Minimum amount of bleeding was controlled with Pressure. The procedure was tolerated well with a pain level of 0 throughout and a pain level of 0 following the procedure. Post Debridement Measurements: 0.8cm length x 0.7cm width x 0.2cm depth; 0.088cm^3 volume. Post debridement Stage noted as Category/Stage II. Character of Wound/Ulcer Post Debridement is stable. Post procedure Diagnosis Wound #5: Same as Pre-Procedure Plan Wound Cleansing: Wound #1 Left,Dorsal Foot: Clean wound with Normal Saline. Cleanse wound with mild soap and water Wound #4 Right Coccyx: Clean wound with Normal Saline. Cleanse wound with mild soap and water Wound #5 Right,Dorsal Toe Second: Clean wound with Normal Saline. Cleanse wound with mild soap and water Anesthetic: Wound #1 Left,Dorsal Foot: Topical  Lidocaine 4% cream applied to wound bed prior to debridement - for clinic use Wound #4 Right Coccyx: Topical Lidocaine 4% cream applied to wound bed prior to debridement - for clinic use Wound #5 Right,Dorsal Toe Second: Topical Lidocaine 4% cream applied to wound bed prior to debridement - for clinic use Skin Barriers/Peri-Wound Care: Wound #4 Right Coccyx: Skin Prep Primary Wound Dressing: Wound #1 Left,Dorsal Foot: Santyl Ointment Wound #4 Right Coccyx: [name] [name], [name] [name] ([id number]) Aquacel Ag - or equivalent to Wound #5 Right,Dorsal Toe Second: Santyl Ointment Secondary Dressing: Wound #1 Left,Dorsal Foot: ABD pad Dry Gauze Gauze and Kerlix/Conform Wound #4 Right Coccyx: Boardered Foam Dressing Wound #5 Right,Dorsal Toe Second: Dry Gauze Conform/Kerlix Dressing Change Frequency: Wound #1 Left,Dorsal Foot: Change dressing every other day. Wound #4 Right Coccyx: Change dressing every other day. Wound #5 Right,Dorsal Toe Second: Change dressing every other day. Follow-up Appointments: Wound #1 Left,Dorsal Foot: Return Appointment in 1 week. Wound #4 Right Coccyx: Return Appointment in 1 week. Wound #5 Right,Dorsal Toe Second: Return Appointment in 1 week. Edema Control: Wound #1 Left,Dorsal Foot: Elevate legs to the level of the heart and pump ankles as often as possible Wound #5 Right,Dorsal Toe Second: Elevate legs to the level of the heart and pump ankles as often as possible Off-Loading: Wound #1 Left,Dorsal Foot: Turn and reposition every 2 hours Wound #  4 Right Coccyx: Turn and reposition every 2 hours Wound #5 Right,Dorsal Toe Second: Turn and reposition every 2 hours Additional Orders / Instructions: Wound #1 Left,Dorsal Foot: Increase protein intake. Wound #4 Right Coccyx: Increase protein intake. Wound #5 Right,Dorsal Toe Second: Increase protein intake. Home Health: Wound #1 Left,Dorsal Foot: [place]  Nurse may visit PRN to address patient s wound care needs. FACE TO FACE ENCOUNTER: MEDICARE and MEDICAID PATIENTS: I certify that this patient is under [name] [name], [name] [name] ([id number]) my care and that I had a face-to-face encounter that meets the physician face-to-face encounter requirements with this patient on this date. The encounter with the patient was in whole or in part for the following MEDICAL CONDITION: (primary reason for [place]) MEDICAL NECESSITY: I certify, that based on my findings, NURSING services are a medically necessary home health service. HOME BOUND STATUS: I certify that my clinical findings support that this patient is homebound (i.e., Due to illness or injury, pt requires aid of supportive devices such as crutches, cane, wheelchairs, walkers, the use of special transportation or the assistance of another person to leave their place of residence. There is a normal inability to leave the home and doing so requires considerable and taxing effort. Other absences are for medical reasons / religious services and are infrequent or of short duration when for other reasons). If current dressing causes regression in wound condition, may D/C ordered dressing product/s and apply Normal Saline Moist Dressing daily until next [place] / Other MD appointment. [place] of regression in wound condition at [phone]. Please direct any NON-WOUND related issues/requests for orders to patient's Primary Care Physician Wound #4 Right Coccyx: [place] Nurse may visit PRN to address patient s wound care needs. FACE TO FACE ENCOUNTER: MEDICARE and MEDICAID PATIENTS: I certify that this patient is under my care and that I had a face-to-face encounter that meets the physician face-to-face encounter requirements with this patient on this date. The encounter with the patient was in whole or in part for the following  MEDICAL CONDITION: (primary reason for [place]) MEDICAL NECESSITY: I certify, that based on my findings, NURSING services are a medically necessary home health service. HOME BOUND STATUS: I certify that my clinical findings support that this patient is homebound (i.e., Due to illness or injury, pt requires aid of supportive devices such as crutches, cane, wheelchairs, walkers, the use of special transportation or the assistance of another person to leave their place of residence. There is a normal inability to leave the home and doing so requires considerable and taxing effort. Other absences are for medical reasons / religious services and are infrequent or of short duration when for other reasons). If current dressing causes regression in wound condition, may D/C ordered dressing product/s and apply Normal Saline Moist Dressing daily until next [place] / Other MD appointment. [place] of regression in wound condition at [phone]. Please direct any NON-WOUND related issues/requests for orders to patient's Primary Care Physician Wound #5 Right,Dorsal Toe Second: [place] Nurse may visit PRN to address patient s wound care needs. FACE TO FACE ENCOUNTER: MEDICARE and MEDICAID PATIENTS: I certify that this patient is under my care and that I had a face-to-face encounter that meets the physician face-to-face encounter requirements with this patient on this date. The encounter with the patient was in  whole or in part for the following MEDICAL CONDITION: (primary reason for Home Healthcare) MEDICAL NECESSITY: I certify, that based on my findings, NURSING services are a medically necessary home health service. HOME BOUND STATUS: I certify that my clinical findings support that this patient is homebound (i.e., Due to illness or injury, pt requires aid of supportive devices such as crutches, cane, wheelchairs, walkers,  the use of special transportation or the assistance of another person to leave their place of residence. There is a normal inability to leave the home and doing so requires considerable and taxing effort. Other absences are for medical reasons / religious services and are infrequent or of short duration when for other reasons). If current dressing causes regression in wound condition, may D/C ordered dressing product/s and apply Normal Saline Moist Dressing daily until next [place] / Other MD appointment. [place] of regression in wound condition at [phone]. Please direct any NON-WOUND related issues/requests for orders to patient's Primary Care Physician Medications-please add to medication list.: Wound #1 Left,Dorsal Foot: Santyl Enzymatic Ointment Other: - Vitamin C, Zinc, MVI Motter, Sherilyn O. ([id number]) Wound #4 Right Coccyx: Other: - Vitamin C, Zinc, MVI Wound #5 Right,Dorsal Toe Second: Santyl Enzymatic Ointment Other: - Vitamin C, Zinc, MVI the patient is more awake and alert today and after review I have recommended: 1. Silver alginate and a bordered foam to the sacral and left gluteal area 2. Santyl ointment to the dorsum of the left foot. A light Kerlix and [place] dressing to be applied 3. Santyl ointment to the right second toe -- this one probes down to bone but we will continue palliative care and hopefully she will not need any bone debridement 4. supportive care, with adequate protein, vitamin A, vitamin C and zinc has also been emphasized 5. offloading has been discussed a lot with her caregivers. Electronic Signature(s) Signed: 04/01/2017 4:32:16 PM By: Christin Fudge MD, FACS Previous Signature: 04/01/2017 11:40:08 AM Version By: Christin Fudge MD, FACS Entered By: Christin Fudge on 04/01/2017 16:32:16 [name] [name] ([id number]) -------------------------------------------------------------------------------- SuperBill Details Patient  Name: [name] [name] Date of Service: 04/01/2017 Medical Record Number: [id number] Patient Account Number: [id number] Date of Birth/Sex: 1923-10-18 (83 y.o. Female) Treating RN: Afful, RN, BSN, [place] Primary Care Provider: Celedonio Miyamoto Other Clinician: Referring Provider: Celedonio Miyamoto Treating Provider/Extender: Frann Rider in Treatment: 7 Diagnosis Coding ICD-10 Codes Code Description [phone] Pressure ulcer of left buttock, stage 2 L97.222 Non-pressure chronic ulcer of left calf with fat layer exposed L97.522 Non-pressure chronic ulcer of other part of left foot with fat layer exposed I73.9 Peripheral vascular disease, unspecified L97.513 Non-pressure chronic ulcer of other part of right foot with necrosis of muscle Facility Procedures CPT4: Description Modifier Quantity Code [id number] 11042 - DEB SUBQ TISSUE 20 SQ CM/< 1 ICD-10 Description Diagnosis L89.322 Pressure ulcer of left buttock, stage 2 L97.222 Non-pressure chronic ulcer of left calf with fat layer exposed L97.522  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

## 2017-04-03 NOTE — Clinical Social Work Note (Signed)
CSW received consult that patient is from [facility]. CSW will assess when able.   Santiago Bumpers, MSW, Latanya Presser [phone]

## 2017-04-04 LAB — BASIC METABOLIC PANEL
ANION GAP: 7 (ref 5–15)
BUN: 64 mg/dL — ABNORMAL HIGH (ref 6–20)
CALCIUM: 8.9 mg/dL (ref 8.9–10.3)
CO2: 32 mmol/L (ref 22–32)
Chloride: 97 mmol/L — ABNORMAL LOW (ref 101–111)
Creatinine, Ser: 2.45 mg/dL — ABNORMAL HIGH (ref 0.44–1.00)
GFR, EST AFRICAN AMERICAN: 19 mL/min — AB (ref >60)
GFR, EST NON AFRICAN AMERICAN: 16 mL/min — AB (ref >60)
Glucose, Bld: 93 mg/dL (ref 65–99)
Potassium: 4.2 mmol/L (ref 3.5–5.1)
SODIUM: 136 mmol/L (ref 135–145)

## 2017-04-04 LAB — CBC
HCT: 25.1 % — ABNORMAL LOW (ref 35.0–47.0)
Hemoglobin: 8.4 g/dL — ABNORMAL LOW (ref 12.0–16.0)
MCH: 33.7 pg (ref 26.0–34.0)
MCHC: 33.6 g/dL (ref 32.0–36.0)
MCV: 100.4 fL — ABNORMAL HIGH (ref 80.0–100.0)
PLATELETS: 198 10*3/uL (ref 150–440)
RBC: 2.51 MIL/uL — AB (ref 3.80–5.20)
RDW: 13.7 % (ref 11.5–14.5)
WBC: 9 10*3/uL (ref 3.6–11.0)

## 2017-04-04 LAB — GLUCOSE, CAPILLARY
GLUCOSE-CAPILLARY: 101 mg/dL — AB (ref 65–99)
GLUCOSE-CAPILLARY: 96 mg/dL (ref 65–99)
Glucose-Capillary: 152 mg/dL — ABNORMAL HIGH (ref 65–99)
Glucose-Capillary: 146 mg/dL — ABNORMAL HIGH (ref 65–99)

## 2017-04-04 NOTE — Progress Notes (Signed)
Physical Therapy Treatment Patient Details Name: [name] [name] MRN: [id number] DOB: 1923-08-23 Today's Date: 04/04/2017    History of Present Illness Pt is a [name] admitted to acute care recently for SOB, returns with COPD exacerbation and general weakness on 04/01/17. Prior to admission, pt required assistance with ADL's and required [facility] for in home navigation, secondary to fall in April that led to L humeral fx, (currently partially healed). Pt utilizes 2L supplemental O2 24/7 in the home, secondary to COPD. Pt PMH includes: OPD, CHF, chronic respiratory failure, CAD, HTN, HLD, PVD, anemia, emphysema, GERD, neuropathy, OP renal disorder, and stroke.     PT Comments    Pt is a pleasant 83 y.o. F, admitted to acute care for COPD exacerbation and general weakness. Pt supine in bed upon arrival and highly irritable. SPT noted slight skin tear to pt L shin upon arrival. Pt performs bed mobility with maxA +2 due to generalized weakness, poor endurance, pain to L ft, and fear of using L UE. Tranfers and ambulation not performed due to safety concerns and severe pain to L ft. Pt utilized 2L supplemental O2 throughout treatment and maintained O2 sat > 90%. Pt presents with the following deficits: strength, endurance, and pain. Overall, pt responded well to today's treatment with no adverse affects. However, her function has declined and she is no longer progressing towards achieving goals. Pt would benefit from continued skilled PT to address the previously mentioned impairments and promote return to PLOF. Received call from MD to reassess pt as she has demonstrated functional decline since prior sessions. This is seen through decreased mobility and increased need for assistance with functional tasks, therefore current recommendation is SNF pending d/c due to pt decline in function.    Follow Up Recommendations  SNF     Equipment Recommendations  None recommended by PT    Recommendations for Other  Services       Precautions / Restrictions Precautions Precautions: None Restrictions Weight Bearing Restrictions: No    Mobility  Bed Mobility Overal bed mobility: Needs Assistance Bed Mobility: Supine to Sit     Supine to sit: Max assist;+2 for physical assistance     General bed mobility comments: MaxA +2 for bed mobility (second person for safety behind pt), secondary to generalized muscle weakness, impaired endurance, pain to L ft, and fear of using L UE. Pt education re: there are no weight bearing restrictions on L UE; she has been cleared to utilize UE.   Transfers Overall transfer level: Needs assistance               General transfer comment: Not attempted due to high level of pain to L ft.   Ambulation/Gait             General Gait Details: Not attempted due to safety concerns and high levels of pain throught the L ft.    Stairs            Wheelchair Mobility    Modified Rankin (Stroke Patients Only)       Balance                                            Cognition Arousal/Alertness: Awake/alert Behavior During Therapy: Agitated Overall Cognitive Status: Within Functional Limits for tasks assessed  Exercises Other Exercises Other Exercises: Supine therex to B LE's with supervision and 2L supplemental O2 x10 reps: ankle pumps, SLR, and hip abd    General Comments        Pertinent Vitals/Pain Pain Assessment: 0-10 Pain Score: 10-Worst pain ever Pain Location: foot (L foot "ulcer") Pain Descriptors / Indicators: Constant ("Couldn't be worse") Pain Intervention(s): Limited activity within patient's tolerance;Monitored during session;Patient requesting pain meds-RN notified    Home Living                      Prior Function            PT Goals (current goals can now be found in the care plan section) Acute Rehab PT Goals Patient Stated Goal:  To go home. PT Goal Formulation: With patient Time For Goal Achievement: 04/16/17 Potential to Achieve Goals: Fair Progress towards PT goals: Not progressing toward goals - comment    Frequency    Min 2X/week      PT Plan Discharge plan needs to be updated    Co-evaluation              AM-PAC PT "6 Clicks" Daily Activity  Outcome Measure  Difficulty turning over in bed (including adjusting bedclothes, sheets and blankets)?: Total Difficulty moving from lying on back to sitting on the side of the bed? : Total Difficulty sitting down on and standing up from a chair with arms (e.g., wheelchair, bedside commode, etc,.)?: Total Help needed moving to and from a bed to chair (including a wheelchair)?: A Lot Help needed walking in hospital room?: Total Help needed climbing 3-5 steps with a railing? : Total 6 Click Score: 7    End of Session Equipment Utilized During Treatment: Gait belt;Oxygen       PT Visit Diagnosis: Unsteadiness on feet (R26.81);Other abnormalities of gait and mobility (R26.89);Muscle weakness (generalized) (M62.81);History of falling (Z91.81)     Time: 5631-4970 PT Time Calculation (min) (ACUTE ONLY): 21 min  Charges:                       G Codes:       Oran Rein PT, SPT   Bevelyn Ngo 04/04/2017, 2:50 PM

## 2017-04-04 NOTE — Progress Notes (Signed)
[facility] NAME: [name] [name]    MR#:  [id number]  DATE OF BIRTH:  [birth year]  SUBJECTIVE:  CHIEF COMPLAINT:   Chief Complaint  Patient presents with  . Shortness of Breath   - more congested today, using flutter valve and spitting greenish brown thick mucus -Hoping to go to rehabilitation  REVIEW OF SYSTEMS:  Review of Systems  Constitutional: Positive for malaise/fatigue. Negative for chills and fever.  HENT: Positive for hearing loss. Negative for congestion, ear discharge and nosebleeds.   Eyes: Negative for blurred vision and double vision.  Respiratory: Positive for cough and shortness of breath. Negative for wheezing.   Cardiovascular: Negative for chest pain, palpitations and leg swelling.  Gastrointestinal: Negative for abdominal pain, constipation, diarrhea, nausea and vomiting.  Genitourinary: Negative for dysuria.  Musculoskeletal: Negative for myalgias.  Neurological: Positive for weakness. Negative for dizziness, sensory change, speech change, focal weakness, seizures and headaches.  Psychiatric/Behavioral: Negative for depression.    DRUG ALLERGIES:   Allergies  Allergen Reactions  . Cholesterol Other (See Comments)    Patient states she is allergic to a cholesterol medication, which causes flu-like symptoms. Name is unknown.  . Morphine And Related     Heart problems   . Zoloft [Sertraline Hcl] Swelling    VITALS:  Blood pressure (!) 153/56, pulse 92, temperature 97.4 F (36.3 C), temperature source Oral, resp. rate 18, height 5\' 6"  (1.676 m), weight 64.8 kg (142 lb 12.8 oz), SpO2 99 %.  PHYSICAL EXAMINATION:  Physical Exam  GENERAL:  83 y.o.-year-old patient lying in the bed with no acute distress. Has a thick cough  EYES: Pupils equal, round, reactive to light and accommodation. No scleral icterus. Extraocular muscles intact.  HEENT: Head atraumatic, normocephalic. Oropharynx and nasopharynx  clear.  NECK:  Supple, no jugular venous distention. No thyroid enlargement, no tenderness.  LUNGS: Coarse breath sounds bilaterally, no wheezing, rales or crepitation. Some upper airway scattered rhonchi present. No use of accessory muscles of respiration.  CARDIOVASCULAR: S1, S2 normal. No  rubs, or gallops. 3/6 systolic murmur present ABDOMEN: Soft, nontender, nondistended. Bowel sounds present. No organomegaly or mass.  EXTREMITIES: No pedal edema, cyanosis, or clubbing.  NEUROLOGIC: Cranial nerves II through XII are intact. Muscle strength 5/5 in all extremities. Sensation intact. Gait not checked. Global weakness noted. PSYCHIATRIC: The patient is alert and oriented x 3.  SKIN: No obvious rash, lesion, or ulcer.    LABORATORY PANEL:   CBC  Recent Labs Lab 04/04/17 0524  WBC 9.0  HGB 8.4*  HCT 25.1*  PLT 198   ------------------------------------------------------------------------------------------------------------------  Chemistries   Recent Labs Lab 04/04/17 0524  NA 136  K 4.2  CL 97*  CO2 32  GLUCOSE 93  BUN 64*  CREATININE 2.45*  CALCIUM 8.9   ------------------------------------------------------------------------------------------------------------------  Cardiac Enzymes  Recent Labs Lab 04/02/17 0722  TROPONINI <0.03   ------------------------------------------------------------------------------------------------------------------  RADIOLOGY:  No results found.  EKG:   Orders placed or performed during the hospital encounter of 04/01/17  . ED EKG  . ED EKG  . EKG 12-Lead  . EKG 12-Lead    ASSESSMENT AND PLAN:   83 y/o F with PMH significant for COPD, CHF, chronic home o2 of 3L, CAD, HTN, presents from [facility] for shortness of breath and cough.  #1 Acute on chronic diastolic CHF- on IV lasix, diuresing well Monitor on tele Change to oral Lasix  #2 COPD exacerbation with bronchitis- still  has upper airway congestion- mucinex,  added flutter valve and chest percussion if needed On levaquin, nebs and inhalers And Mucinex  #3 HTN- continue home meds- on coreg  #4 CKD stage 4-stable, slight worsening noted today. Monitor while on Lasix  #5 Generalized weakness- PT consulted, recommended HHPT, but patient interested in SNF which is appropriate as needing a lot of assistance  #6 Sacral pressure ulcer- appreciate wound RN consult, continue dressing changes per recommendations  #6 DVT Prophylaxis- on lovenox   Social worker consult   All the records are reviewed and case discussed with Care Management/Social Workerr. Management plans discussed with the patient, family and they are in agreement.  CODE STATUS: Full Code  TOTAL TIME TAKING CARE OF THIS PATIENT: 38 minutes.   POSSIBLE D/C IN 1-2 DAYS, DEPENDING ON CLINICAL CONDITION.   Gladstone Lighter M.D on 04/04/2017 at 1:14 PM  Between 7am to 6pm - Pager - [phone]  After 6pm go to [email or web address] - password [email or web address] [facility]  Office  [phone]  CC: Primary care physician; Raelyn Number, MD

## 2017-04-04 NOTE — Progress Notes (Signed)
Patient sounds less congested today versus yesterday.  RT has been working with this patient with flutter device.  Patient has had a good productive cough each time I have worked with her using such.  Patient does not tolerate chest percussion so will continue to use flutter to help with airway clearance.

## 2017-04-05 ENCOUNTER — Inpatient Hospital Stay: Payer: Medicare Other

## 2017-04-05 LAB — BASIC METABOLIC PANEL
Anion gap: 7 (ref 5–15)
BUN: 57 mg/dL — AB (ref 6–20)
CO2: 33 mmol/L — ABNORMAL HIGH (ref 22–32)
Calcium: 8.8 mg/dL — ABNORMAL LOW (ref 8.9–10.3)
Chloride: 95 mmol/L — ABNORMAL LOW (ref 101–111)
Creatinine, Ser: 1.86 mg/dL — ABNORMAL HIGH (ref 0.44–1.00)
GFR calc Af Amer: 26 mL/min — ABNORMAL LOW (ref >60)
GFR, EST NON AFRICAN AMERICAN: 22 mL/min — AB (ref >60)
GLUCOSE: 109 mg/dL — AB (ref 65–99)
POTASSIUM: 3.8 mmol/L (ref 3.5–5.1)
SODIUM: 135 mmol/L (ref 135–145)

## 2017-04-05 MED ORDER — LEVOFLOXACIN 500 MG PO TABS
250.0000 mg | ORAL_TABLET | ORAL | Status: DC
  Administered 2017-04-06: 250 mg via ORAL
  Filled 2017-04-05: qty 1

## 2017-04-05 MED ORDER — POLYETHYLENE GLYCOL 3350 17 G PO PACK: 17.0000 g | PACK | Freq: Every day | ORAL | Status: DC

## 2017-04-05 NOTE — Progress Notes (Signed)
[facility] [place] NAME: [name] [name]    MR#:  [id number]  DATE OF BIRTH:  [birth year]  SUBJECTIVE:  CHIEF COMPLAINT:   Chief Complaint  Patient presents with  . Shortness of Breath   - no complains, feels better today.  REVIEW OF SYSTEMS:  Review of Systems  Constitutional: Positive for malaise/fatigue. Negative for chills and fever.  HENT: Positive for hearing loss. Negative for congestion, ear discharge and nosebleeds.   Eyes: Negative for blurred vision and double vision.  Respiratory: Negative for cough, sputum production and wheezing.   Cardiovascular: Negative for chest pain, palpitations and leg swelling.  Gastrointestinal: Negative for abdominal pain, constipation, diarrhea, nausea and vomiting.  Genitourinary: Negative for dysuria.  Musculoskeletal: Negative for myalgias.  Neurological: Positive for weakness. Negative for dizziness, sensory change, speech change, focal weakness, seizures and headaches.  Psychiatric/Behavioral: Negative for depression.    DRUG ALLERGIES:   Allergies  Allergen Reactions  . Cholesterol Other (See Comments)    Patient states she is allergic to a cholesterol medication, which causes flu-like symptoms. Name is unknown.  . Morphine And Related     Heart problems   . Zoloft [Sertraline Hcl] Swelling    VITALS:  Blood pressure (!) 134/47, pulse 86, temperature 98.7 F (37.1 C), temperature source Oral, resp. rate 18, height 5\' 6"  (1.676 m), weight 64 kg (141 lb 3.2 oz), SpO2 98 %.  PHYSICAL EXAMINATION:  Physical Exam  GENERAL:  83 y.o.-year-old patient lying in the bed with no acute distress. Has a thick cough  EYES: Pupils equal, round, reactive to light and accommodation. No scleral icterus. Extraocular muscles intact.  HEENT: Head atraumatic, normocephalic. Oropharynx and nasopharynx clear.  NECK:  Supple, no jugular venous distention. No thyroid enlargement, no tenderness.  LUNGS:  Coarse breath sounds bilaterally, no wheezing, rales or crepitation. Some upper airway scattered rhonchi present. No use of accessory muscles of respiration.  CARDIOVASCULAR: S1, S2 normal. No  rubs, or gallops. 3/6 systolic murmur present ABDOMEN: Soft, nontender, nondistended. Bowel sounds present. No organomegaly or mass.  EXTREMITIES: No pedal edema, cyanosis, or clubbing.  NEUROLOGIC: Cranial nerves II through XII are intact. Muscle strength 3-4/5 in all extremities. Sensation intact. Gait not checked. Global weakness noted. PSYCHIATRIC: The patient is alert and oriented x 3.  SKIN: No obvious rash, lesion, or ulcer.    LABORATORY PANEL:   CBC  Recent Labs Lab 04/04/17 0524  WBC 9.0  HGB 8.4*  HCT 25.1*  PLT 198   ------------------------------------------------------------------------------------------------------------------  Chemistries   Recent Labs Lab 04/05/17 0519  NA 135  K 3.8  CL 95*  CO2 33*  GLUCOSE 109*  BUN 57*  CREATININE 1.86*  CALCIUM 8.8*   ------------------------------------------------------------------------------------------------------------------  Cardiac Enzymes  Recent Labs Lab 04/02/17 0722  TROPONINI <0.03   ------------------------------------------------------------------------------------------------------------------  RADIOLOGY:  Dg Chest 2 View  Result Date: 04/05/2017 CLINICAL DATA:  Cough and shortness of Breath EXAM: CHEST  2 VIEW COMPARISON:  04/01/2017 cardiac shadow is again enlarged. Aortic calcifications are again seen. FINDINGS: Cardiac shadow is again enlarged. Aortic calcifications are noted. Chronic blunting of the costophrenic angle on the right is seen. Mild interstitial changes are noted. Mild right basilar changes are seen. Left humeral fracture is again noted. IMPRESSION: Mild right basilar atelectatic changes are seen. Mild vascular congestion is noted. Electronically Signed   By: Inez Catalina M.D.   On:  04/05/2017 07:34    EKG:   Orders placed or  performed during the hospital encounter of 04/01/17  . ED EKG  . ED EKG  . EKG 12-Lead  . EKG 12-Lead    ASSESSMENT AND PLAN:   83 y/o F with PMH significant for COPD, CHF, chronic home o2 of 3L, CAD, HTN, presents from [facility] for shortness of breath and cough.  #1 Acute on chronic diastolic CHF- on IV lasix, diuresing well Monitor on tele Changed to oral Lasix  #2 COPD exacerbation with bronchitis- still has upper airway congestion- mucinex, added flutter valve and chest percussion if needed On levaquin, nebs and inhalers And Mucinex  #3 HTN- continue home meds- on coreg  #4 CKD stage 4-stable, slight worsening noted today. Monitor while on Lasix  #5 Generalized weakness- PT consulted, recommended HHPT, but patient interested in SNF which is appropriate as needing a lot of assistance CSW is working today on getting SNF set up.  #6 Sacral pressure ulcer- appreciate wound RN consult, continue dressing changes per recommendations  #6 DVT Prophylaxis- on lovenox   Social worker consult   All the records are reviewed and case discussed with Care Management/Social Workerr. Management plans discussed with the patient, family and they are in agreement.  CODE STATUS: Full Code  TOTAL TIME TAKING CARE OF THIS PATIENT: 38 minutes.   POSSIBLE D/C IN 1-2 DAYS, DEPENDING ON CLINICAL CONDITION.   [name] M.D on 04/05/2017 at 5:32 PM  Between 7am to 6pm - Pager - [phone]  After 6pm go to [email or web address] - password [email or web address] [facility]  Office  [phone]  CC: Primary care physician; Raelyn Number, MD

## 2017-04-05 NOTE — Care Management (Signed)
Flutter valve added to treatment regime.  Physical therapy recommended skilled nursing placement and a bed search has been initiated.

## 2017-04-06 DIAGNOSIS — Z5189 Encounter for other specified aftercare: Secondary | ICD-10-CM | POA: Diagnosis not present

## 2017-04-06 DIAGNOSIS — R21 Rash and other nonspecific skin eruption: Secondary | ICD-10-CM | POA: Diagnosis not present

## 2017-04-06 DIAGNOSIS — R5383 Other fatigue: Secondary | ICD-10-CM | POA: Diagnosis not present

## 2017-04-06 DIAGNOSIS — E785 Hyperlipidemia, unspecified: Secondary | ICD-10-CM | POA: Diagnosis present

## 2017-04-06 DIAGNOSIS — K219 Gastro-esophageal reflux disease without esophagitis: Secondary | ICD-10-CM | POA: Diagnosis present

## 2017-04-06 DIAGNOSIS — D464 Refractory anemia, unspecified: Secondary | ICD-10-CM | POA: Diagnosis not present

## 2017-04-06 DIAGNOSIS — A419 Sepsis, unspecified organism: Secondary | ICD-10-CM | POA: Diagnosis not present

## 2017-04-06 DIAGNOSIS — J9621 Acute and chronic respiratory failure with hypoxia: Secondary | ICD-10-CM | POA: Diagnosis not present

## 2017-04-06 DIAGNOSIS — S42302D Unspecified fracture of shaft of humerus, left arm, subsequent encounter for fracture with routine healing: Secondary | ICD-10-CM | POA: Diagnosis not present

## 2017-04-06 DIAGNOSIS — K589 Irritable bowel syndrome without diarrhea: Secondary | ICD-10-CM | POA: Diagnosis not present

## 2017-04-06 DIAGNOSIS — R51 Headache: Secondary | ICD-10-CM | POA: Diagnosis not present

## 2017-04-06 DIAGNOSIS — F064 Anxiety disorder due to known physiological condition: Secondary | ICD-10-CM | POA: Diagnosis not present

## 2017-04-06 DIAGNOSIS — J439 Emphysema, unspecified: Secondary | ICD-10-CM | POA: Diagnosis present

## 2017-04-06 DIAGNOSIS — I509 Heart failure, unspecified: Secondary | ICD-10-CM | POA: Diagnosis present

## 2017-04-06 DIAGNOSIS — Z9981 Dependence on supplemental oxygen: Secondary | ICD-10-CM | POA: Diagnosis not present

## 2017-04-06 DIAGNOSIS — N184 Chronic kidney disease, stage 4 (severe): Secondary | ICD-10-CM | POA: Diagnosis present

## 2017-04-06 DIAGNOSIS — L97429 Non-pressure chronic ulcer of left heel and midfoot with unspecified severity: Secondary | ICD-10-CM | POA: Diagnosis not present

## 2017-04-06 DIAGNOSIS — L89890 Pressure ulcer of other site, unstageable: Secondary | ICD-10-CM | POA: Diagnosis not present

## 2017-04-06 DIAGNOSIS — R1312 Dysphagia, oropharyngeal phase: Secondary | ICD-10-CM | POA: Diagnosis not present

## 2017-04-06 DIAGNOSIS — L97921 Non-pressure chronic ulcer of unspecified part of left lower leg limited to breakdown of skin: Secondary | ICD-10-CM | POA: Diagnosis not present

## 2017-04-06 DIAGNOSIS — Z8673 Personal history of transient ischemic attack (TIA), and cerebral infarction without residual deficits: Secondary | ICD-10-CM | POA: Diagnosis not present

## 2017-04-06 DIAGNOSIS — Y95 Nosocomial condition: Secondary | ICD-10-CM | POA: Diagnosis present

## 2017-04-06 DIAGNOSIS — Z7982 Long term (current) use of aspirin: Secondary | ICD-10-CM | POA: Diagnosis not present

## 2017-04-06 DIAGNOSIS — J189 Pneumonia, unspecified organism: Secondary | ICD-10-CM | POA: Diagnosis not present

## 2017-04-06 DIAGNOSIS — R6889 Other general symptoms and signs: Secondary | ICD-10-CM | POA: Diagnosis not present

## 2017-04-06 DIAGNOSIS — I130 Hypertensive heart and chronic kidney disease with heart failure and stage 1 through stage 4 chronic kidney disease, or unspecified chronic kidney disease: Secondary | ICD-10-CM | POA: Diagnosis not present

## 2017-04-06 DIAGNOSIS — R498 Other voice and resonance disorders: Secondary | ICD-10-CM | POA: Diagnosis not present

## 2017-04-06 DIAGNOSIS — N189 Chronic kidney disease, unspecified: Secondary | ICD-10-CM | POA: Diagnosis not present

## 2017-04-06 DIAGNOSIS — M79672 Pain in left foot: Secondary | ICD-10-CM | POA: Diagnosis not present

## 2017-04-06 DIAGNOSIS — Z66 Do not resuscitate: Secondary | ICD-10-CM | POA: Diagnosis present

## 2017-04-06 DIAGNOSIS — J9690 Respiratory failure, unspecified, unspecified whether with hypoxia or hypercapnia: Secondary | ICD-10-CM | POA: Diagnosis not present

## 2017-04-06 DIAGNOSIS — R05 Cough: Secondary | ICD-10-CM | POA: Diagnosis not present

## 2017-04-06 DIAGNOSIS — L89892 Pressure ulcer of other site, stage 2: Secondary | ICD-10-CM | POA: Diagnosis not present

## 2017-04-06 DIAGNOSIS — J9600 Acute respiratory failure, unspecified whether with hypoxia or hypercapnia: Secondary | ICD-10-CM | POA: Diagnosis not present

## 2017-04-06 DIAGNOSIS — Z7902 Long term (current) use of antithrombotics/antiplatelets: Secondary | ICD-10-CM | POA: Diagnosis not present

## 2017-04-06 DIAGNOSIS — L89152 Pressure ulcer of sacral region, stage 2: Secondary | ICD-10-CM | POA: Diagnosis not present

## 2017-04-06 DIAGNOSIS — I739 Peripheral vascular disease, unspecified: Secondary | ICD-10-CM | POA: Diagnosis not present

## 2017-04-06 DIAGNOSIS — F419 Anxiety disorder, unspecified: Secondary | ICD-10-CM | POA: Diagnosis not present

## 2017-04-06 DIAGNOSIS — Z9911 Dependence on respirator [ventilator] status: Secondary | ICD-10-CM | POA: Diagnosis not present

## 2017-04-06 DIAGNOSIS — W19XXXD Unspecified fall, subsequent encounter: Secondary | ICD-10-CM | POA: Diagnosis present

## 2017-04-06 DIAGNOSIS — M6281 Muscle weakness (generalized): Secondary | ICD-10-CM | POA: Diagnosis not present

## 2017-04-06 DIAGNOSIS — Z79899 Other long term (current) drug therapy: Secondary | ICD-10-CM | POA: Diagnosis not present

## 2017-04-06 DIAGNOSIS — L97519 Non-pressure chronic ulcer of other part of right foot with unspecified severity: Secondary | ICD-10-CM | POA: Diagnosis not present

## 2017-04-06 DIAGNOSIS — I10 Essential (primary) hypertension: Secondary | ICD-10-CM | POA: Diagnosis not present

## 2017-04-06 DIAGNOSIS — R531 Weakness: Secondary | ICD-10-CM | POA: Diagnosis not present

## 2017-04-06 DIAGNOSIS — J449 Chronic obstructive pulmonary disease, unspecified: Secondary | ICD-10-CM | POA: Diagnosis not present

## 2017-04-06 DIAGNOSIS — I5033 Acute on chronic diastolic (congestive) heart failure: Secondary | ICD-10-CM | POA: Diagnosis not present

## 2017-04-06 DIAGNOSIS — R630 Anorexia: Secondary | ICD-10-CM | POA: Diagnosis present

## 2017-04-06 DIAGNOSIS — I5020 Unspecified systolic (congestive) heart failure: Secondary | ICD-10-CM | POA: Diagnosis not present

## 2017-04-06 DIAGNOSIS — J441 Chronic obstructive pulmonary disease with (acute) exacerbation: Secondary | ICD-10-CM | POA: Diagnosis not present

## 2017-04-06 DIAGNOSIS — G629 Polyneuropathy, unspecified: Secondary | ICD-10-CM | POA: Diagnosis present

## 2017-04-06 DIAGNOSIS — M810 Age-related osteoporosis without current pathological fracture: Secondary | ICD-10-CM | POA: Diagnosis present

## 2017-04-06 DIAGNOSIS — I2510 Atherosclerotic heart disease of native coronary artery without angina pectoris: Secondary | ICD-10-CM | POA: Diagnosis present

## 2017-04-06 MED ORDER — LEVOFLOXACIN 250 MG PO TABS: 250.0000 mg | ORAL_TABLET | ORAL | 0 refills | Status: AC

## 2017-04-06 MED ORDER — TRAMADOL HCL 50 MG PO TABS: 50.0000 mg | ORAL_TABLET | Freq: Two times a day (BID) | ORAL | 0 refills | Status: AC | PRN

## 2017-04-06 MED ORDER — ALPRAZOLAM 0.25 MG PO TABS: 0.1250 mg | ORAL_TABLET | Freq: Two times a day (BID) | ORAL | 0 refills | Status: AC | PRN

## 2017-04-06 MED ORDER — ALBUTEROL SULFATE (2.5 MG/3ML) 0.083% IN NEBU: 2.5000 mg | INHALATION_SOLUTION | Freq: Four times a day (QID) | RESPIRATORY_TRACT | 12 refills | Status: AC | PRN

## 2017-04-06 MED ORDER — TIOTROPIUM BROMIDE MONOHYDRATE 18 MCG IN CAPS: 18.0000 ug | ORAL_CAPSULE | Freq: Every day | RESPIRATORY_TRACT | 12 refills | Status: AC

## 2017-04-06 MED ORDER — GABAPENTIN 100 MG PO CAPS: 200.0000 mg | ORAL_CAPSULE | Freq: Three times a day (TID) | ORAL | 0 refills | Status: AC

## 2017-04-06 NOTE — Clinical Social Work Note (Addendum)
CSW spoke to patient's daughter in regards to patient needing SNF for short term rehab.  Patient's daughter stated she has discussed it with her mom and they have decided to pursue SNF for short term rehab, and then look for long term care nursing placement.  Patient and daughter feel that patient's medical needs have increased and they may want to consider SNF as a permanent placement.  CSW was given permission to begin bed search.  CSW spoke to [place] at [place] and updated her on patient's potential discharge plan.  Jones Broom. [place], MSW, [place]  04/06/2017 11:48 AM

## 2017-04-06 NOTE — Clinical Social Work Note (Signed)
CSW provided bed offers to patient and her daughter, and they have chosen [facility] [place] [facility].  CSW contacted [facility] and they can accept patient today.  Patient to be d/c'ed today to [facility] [place].  Patient and family agreeable to plans will transport via ems RN to call report to 500 hall nurse room [phone].  Evette Cristal, MSW, [place]

## 2017-04-06 NOTE — NC FL2 (Signed)
[place] LEVEL OF CARE SCREENING TOOL     IDENTIFICATION  Patient Name: [name] [name] Birthdate: 05-Aug-1923 Sex: female Admission Date (Current Location): 04/01/2017  [place] and [place] Number:  Engineering geologist and Address:  [facility], [address] [place], [place] [address]      Provider Number: [id number]  Attending Physician Name and Address:  [name], *  Relative Name and Phone Number:  Sheilah Pigeon Daughter [phone]     Current Level of Care: Hospital Recommended Level of Care: [place] Prior Approval Number:    Date Approved/Denied:   PASRR Number: [id number]  Discharge Plan: SNF    Current Diagnoses: Patient Active Problem List   Diagnosis Date Noted  . Acute on chronic diastolic CHF (congestive heart failure) ([place]) 04/01/2017  . Acute on chronic respiratory failure with hypoxia ([place]) 04/01/2017  . Malignant hypertension 04/01/2017  . Hyponatremia 04/01/2017  . Hyperkalemia 04/01/2017  . COPD exacerbation ([place]) 02/18/2017  . Pressure injury of skin 02/18/2017  . Chronic diastolic heart failure ([place]) 12/15/2016  . HTN (hypertension) 12/15/2016  . Hypotension 11/16/2016  . CAP (community acquired pneumonia) 11/07/2016  . Generalized anxiety disorder 09/26/2016  . PAD (peripheral artery disease) ([place]) 10/05/2013  . Chronic venous insufficiency 10/05/2013    Orientation RESPIRATION BLADDER Height & Weight     Self  O2 (2L) Incontinent Weight: 137 lb 6.4 oz (62.3 kg) Height:  5\' 6"  (167.6 cm)  BEHAVIORAL SYMPTOMS/MOOD NEUROLOGICAL BOWEL NUTRITION STATUS      Continent Diet (Dysphagia 2 diet)  AMBULATORY STATUS COMMUNICATION OF NEEDS Skin   Limited Assist Verbally PU Stage and Appropriate Care, Surgical wounds   PU Stage 2 Dressing:  (PRN dressing changes)                   Personal Care Assistance Level of Assistance  Bathing, Feeding, Dressing Bathing  Assistance: Limited assistance Feeding assistance: Independent Dressing Assistance: Limited assistance     Functional Limitations Info  Sight, Hearing, Speech Sight Info: Adequate Hearing Info: Adequate Speech Info: Adequate    SPECIAL CARE FACTORS FREQUENCY  PT (By licensed PT)     PT Frequency: 5x a week              Contractures Contractures Info: Not present    Additional Factors Info  Code Status, Allergies Code Status Info: Full Code Allergies Info: CHOLESTEROL, MORPHINE AND RELATED, ZOLOFT SERTRALINE HCL            Current Medications (04/06/2017):  This is the current hospital active medication list Current Facility-Administered Medications  Medication Dose Route Frequency Provider Last Rate Last Dose  . 0.9 %  sodium chloride infusion  250 mL Intravenous PRN Theodoro Grist, MD      . acetaminophen (TYLENOL) tablet 650 mg  650 mg Oral Q6H PRN Theodoro Grist, MD   650 mg at 04/06/17 0125   Or  . acetaminophen (TYLENOL) suppository 650 mg  650 mg Rectal Q6H PRN Theodoro Grist, MD      . acidophilus (RISAQUAD) capsule 1 capsule  1 capsule Oral Daily Theodoro Grist, MD   1 capsule at 04/06/17 0929  . albuterol (PROVENTIL) (2.5 MG/3ML) 0.083% nebulizer solution 2.5 mg  2.5 mg Nebulization Q4H Theodoro Grist, MD   2.5 mg at 04/06/17 0944  . ALPRAZolam Duanne Moron) tablet 0.125 mg  0.125 mg Oral BID PRN Theodoro Grist, MD   0.125 mg at 04/05/17 2143  . aspirin EC tablet 81  mg  81 mg Oral Daily Theodoro Grist, MD   81 mg at 04/06/17 0929  . carvedilol (COREG) tablet 25 mg  25 mg Oral BID Theodoro Grist, MD   25 mg at 04/06/17 0929  . chlorpheniramine-HYDROcodone (TUSSIONEX) 10-8 MG/5ML suspension 5 mL  5 mL Oral Q12H Theodoro Grist, MD   5 mL at 04/06/17 0930  . cholecalciferol (VITAMIN D) tablet 4,000 Units  4,000 Units Oral Daily Theodoro Grist, MD   4,000 Units at 04/06/17 0929  . clopidogrel (PLAVIX) tablet 75 mg  75 mg Oral Daily Theodoro Grist, MD   75 mg at 04/06/17  0929  . dicyclomine (BENTYL) tablet 20 mg  20 mg Oral TID PRN Theodoro Grist, MD   20 mg at 04/02/17 0047  . enoxaparin (LOVENOX) injection 30 mg  30 mg Subcutaneous Q24H Theodoro Grist, MD   30 mg at 04/05/17 2142  . famotidine (PEPCID) tablet 20 mg  20 mg Oral Daily Theodoro Grist, MD   20 mg at 04/06/17 0931  . feeding supplement (BOOST HIGH PROTEIN) liquid 237 mL  1 Container Oral Daily Theodoro Grist, MD   237 mL at 04/06/17 0938  . fluticasone (FLONASE) 50 MCG/ACT nasal spray 2 spray  2 spray Each Nare Daily Theodoro Grist, MD   2 spray at 04/05/17 1004  . furosemide (LASIX) tablet 40 mg  40 mg Oral Daily Gladstone Lighter, MD   40 mg at 04/06/17 0929  . gabapentin (NEURONTIN) capsule 200 mg  200 mg Oral TID [name], MD   200 mg at 04/06/17 0929  . guaiFENesin (MUCINEX) 12 hr tablet 600 mg  600 mg Oral BID Theodoro Grist, MD   600 mg at 04/06/17 0929  . levofloxacin (LEVAQUIN) tablet 250 mg  250 mg Oral Q48H Hallaji, Sheema M, RPH   250 mg at 04/06/17 0943  . loratadine (CLARITIN) tablet 10 mg  10 mg Oral Daily Theodoro Grist, MD   10 mg at 04/06/17 0932  . nitroGLYCERIN (NITROGLYN) 2 % ointment 1 inch  1 inch Topical Q6H Theodoro Grist, MD   1 inch at 04/06/17 0545  . ondansetron (ZOFRAN) tablet 4 mg  4 mg Oral Q6H PRN Theodoro Grist, MD       Or  . ondansetron (ZOFRAN) injection 4 mg  4 mg Intravenous Q6H PRN Theodoro Grist, MD      . polyethylene glycol (MIRALAX / GLYCOLAX) packet 17 g  17 g Oral Daily Lance Coon, MD   17 g at 04/06/17 0934  . senna-docusate (Senokot-S) tablet 2 tablet  2 tablet Oral Daily Theodoro Grist, MD   2 tablet at 04/06/17 0932  . sodium chloride flush (NS) 0.9 % injection 3 mL  3 mL Intravenous Q12H Theodoro Grist, MD   3 mL at 04/06/17 0933  . sodium chloride flush (NS) 0.9 % injection 3 mL  3 mL Intravenous PRN Theodoro Grist, MD      . tiotropium (SPIRIVA) inhalation capsule 18 mcg  18 mcg Inhalation Daily Theodoro Grist, MD   18 mcg at  04/05/17 1004  . traMADol (ULTRAM) tablet 50 mg  50 mg Oral Q12H PRN [name], MD   50 mg at 04/06/17 0012  . TRIPLE ANTIBIOTIC [phone] OINT 1 application  1 application Topical Daily Theodoro Grist, MD   1 application at 93/23/55 0944  . vitamin B-12 (CYANOCOBALAMIN) tablet 1,000 mcg  1,000 mcg Oral Daily Theodoro Grist, MD   1,000 mcg at 04/06/17 7322     Discharge  Medications: Please see discharge summary for a list of discharge medications.  Relevant Imaging Results:  Relevant Lab Results:   Additional Information SSN [id number]  Ross Ludwig, [place]

## 2017-04-06 NOTE — Discharge Summary (Signed)
[place] [facility] [place] NAME: [name] [name]    MR#:  [id number]  DATE OF BIRTH:  1923/06/02  DATE OF ADMISSION:  04/01/2017 ADMITTING PHYSICIAN: Theodoro Grist, MD  DATE OF DISCHARGE: 04/06/2017   PRIMARY CARE PHYSICIAN: Raelyn Number, MD    ADMISSION DIAGNOSIS:  COPD exacerbation ([place]) [J44.1]  DISCHARGE DIAGNOSIS:  Active Problems:   Acute on chronic diastolic CHF (congestive heart failure) (HCC)   Acute on chronic respiratory failure with hypoxia (HCC)   Malignant hypertension   Hyponatremia   Hyperkalemia   SECONDARY DIAGNOSIS:   Past Medical History:  Diagnosis Date  . Anemia   . CHF (congestive heart failure) ([place])   . Coronary artery disease   . Emphysema lung ([place])   . Emphysema of lung ([place])   . GERD (gastroesophageal reflux disease)   . Hyperlipidemia   . Hypertension   . Neuropathy   . Osteoporosis   . Peripheral vascular disease ([place])   . Renal disorder   . Stroke [facility])     HOSPITAL COURSE:   83 y/o F with PMH significant for COPD, CHF, chronic home o2 of 3L, CAD, HTN, presents from [facility] for shortness of breath and cough.  #1 Acute on chronic diastolic CHF- on IV lasix, diuresing well Monitor on tele Changed to oral Lasix- stable.  #2 COPD exacerbation with bronchitis- still has upper airway congestion- mucinex, added flutter valve and chest percussion if needed On levaquin, nebs and inhalers And Mucinex  #3 HTN- continue home meds- on coreg  #4 CKD stage 4-stable,  Monitor while on Lasix, stable.  #5 Generalized weakness- PT consulted, recommended HHPT, but patient interested in SNF which is appropriate as needing a lot of assistance CSW is working today on getting SNF set up.  #6 Sacral pressure ulcer- appreciate wound RN consult, continue dressing changes per recommendations  #6 DVT Prophylaxis- on lovenox  DISCHARGE CONDITIONS:   Stable.  CONSULTS OBTAINED:    DRUG  ALLERGIES:   Allergies  Allergen Reactions  . Cholesterol Other (See Comments)    Patient states she is allergic to a cholesterol medication, which causes flu-like symptoms. Name is unknown.  . Morphine And Related     Heart problems   . Zoloft [Sertraline Hcl] Swelling    DISCHARGE MEDICATIONS:   Current Discharge Medication List    START taking these medications   Details  albuterol (PROVENTIL) (2.5 MG/3ML) 0.083% nebulizer solution Take 3 mLs (2.5 mg total) by nebulization every 6 (six) hours as needed for wheezing or shortness of breath. Qty: 75 mL, Refills: 12    levofloxacin (LEVAQUIN) 250 MG tablet Take 1 tablet (250 mg total) by mouth every other day. Qty: 1 tablet, Refills: 0    tiotropium (SPIRIVA) 18 MCG inhalation capsule Place 1 capsule (18 mcg total) into inhaler and inhale daily. Qty: 30 capsule, Refills: 12      CONTINUE these medications which have CHANGED   Details  ALPRAZolam (XANAX) 0.25 MG tablet Take 0.5 tablets (0.125 mg total) by mouth 2 (two) times daily as needed for anxiety or sleep. Qty: 20 tablet, Refills: 0    gabapentin (NEURONTIN) 100 MG capsule Take 2 capsules (200 mg total) by mouth 3 (three) times daily. Qty: 90 capsule, Refills: 0    traMADol (ULTRAM) 50 MG tablet Take 1 tablet (50 mg total) by mouth every 12 (twelve) hours as needed for moderate pain or severe pain. Qty: 30 tablet, Refills:  0      CONTINUE these medications which have NOT CHANGED   Details  acetaminophen (TYLENOL) 325 MG tablet Take 650 mg by mouth every 6 (six) hours as needed for mild pain or moderate pain.    aspirin 81 MG tablet Take 81 mg by mouth daily.    carvedilol (COREG) 25 MG tablet Take 1 tablet (25 mg total) by mouth 2 (two) times daily. Qty: 60 tablet, Refills: 0    celecoxib (CELEBREX) 100 MG capsule Take 100 mg by mouth every other day as needed. Refills: 5    Cholecalciferol 2000 units CAPS Take 4,000 Units by mouth daily.     clopidogrel  (PLAVIX) 75 MG tablet Take 1 tablet by mouth daily.    cyanocobalamin 1000 MCG tablet Take 1,000 mcg by mouth daily.     dicyclomine (BENTYL) 20 MG tablet Take 1 tablet (20 mg total) by mouth 3 (three) times daily as needed for spasms. Qty: 30 tablet, Refills: 0    FLECTOR 1.3 % PTCH Place 1 patch onto the skin every 12 (twelve) hours.     fluticasone (FLONASE) 50 MCG/ACT nasal spray Place 2 sprays into both nostrils daily.    furosemide (LASIX) 20 MG tablet Take 20 mg by mouth daily.    loratadine (CLARITIN) 10 MG tablet Take 10 mg by mouth daily.    neomycin-bacitracin-polymyxin (NEOSPORIN) [phone] ointment Apply 1 application topically daily.    nitroGLYCERIN (NITRODUR - DOSED IN MG/24 HR) 0.2 mg/hr patch Place 1 patch onto the skin daily.     ondansetron (ZOFRAN) 4 MG tablet Take 1 tablet (4 mg total) by mouth every 8 (eight) hours as needed for nausea or vomiting. Qty: 21 tablet, Refills: 0    polyethylene glycol (MIRALAX / GLYCOLAX) packet Take 17 g by mouth daily.    potassium chloride SA (K-DUR,KLOR-CON) 10 MEQ tablet Take 1 tablet (10 mEq total) by mouth daily. Qty: 30 tablet, Refills: 0    Probiotic Product (ALIGN) 4 MG CAPS Take 4 mg by mouth daily.    ranitidine (ZANTAC) 150 MG capsule Take 150 mg by mouth 2 (two) times daily.    sennosides-docusate sodium (SENOKOT-S) 8.6-50 MG tablet Take 2 tablets by mouth daily.    Spacer/Aero-Hold Chamber Mask MISC 1 Units by Does not apply route every 6 (six) hours as needed. Qty: 1 each, Refills: 0    feeding supplement (BOOST HIGH PROTEIN) LIQD Take 1 Container by mouth daily.      STOP taking these medications     albuterol (PROVENTIL HFA;VENTOLIN HFA) 108 (90 Base) MCG/ACT inhaler          DISCHARGE INSTRUCTIONS:    Follow with PMD in 2 weeks. Follow with CHF clinic.  If you experience worsening of your admission symptoms, develop shortness of breath, life threatening emergency, suicidal or homicidal  thoughts you must seek medical attention immediately by calling 911 or calling your MD immediately  if symptoms less severe.  You Must read complete instructions/literature along with all the possible adverse reactions/side effects for all the Medicines you take and that have been prescribed to you. Take any new Medicines after you have completely understood and accept all the possible adverse reactions/side effects.   Please note  You were cared for by a hospitalist during your hospital stay. If you have any questions about your discharge medications or the care you received while you were in the hospital after you are discharged, you can call the unit and asked to speak  with the hospitalist on call if the hospitalist that took care of you is not available. Once you are discharged, your primary care physician will handle any further medical issues. Please note that NO REFILLS for any discharge medications will be authorized once you are discharged, as it is imperative that you return to your primary care physician (or establish a relationship with a primary care physician if you do not have one) for your aftercare needs so that they can reassess your need for medications and monitor your lab values.    Today   CHIEF COMPLAINT:   Chief Complaint  Patient presents with  . Shortness of Breath    HISTORY OF PRESENT ILLNESS:  Denetria Luevanos  is a 83 y.o. female with a known history of COPD, diastolic CHF, chronic respiratory failure, coronary artery disease, hypertension, hyperlipidemia, who presents to the hospital with complaints of worsening shortness of breath over the past one week, cough, dark yellow phlegm production, wheezing, weight gain of approximately 4 pounds over the past one week. She was given 2 nebs. On arrival to emergency room, hospitalist services were contacted for admission. Patient's chest x-ray was concerning for mild CHF. Patient's family is concerned about patient being more  somnolent than usual  VITAL SIGNS:  Blood pressure (!) 102/43, pulse 84, temperature 98.3 F (36.8 C), temperature source Oral, resp. rate 18, height 5\' 6"  (1.676 m), weight 62.3 kg (137 lb 6.4 oz), SpO2 98 %.  I/O:   Intake/Output Summary (Last 24 hours) at 04/06/17 0907 Last data filed at 04/06/17 0247  Gross per 24 hour  Intake              360 ml  Output                0 ml  Net              360 ml    PHYSICAL EXAMINATION:   GENERAL:  83 y.o.-year-old patient lying in the bed with no acute distress. Has a thick cough  EYES: Pupils equal, round, reactive to light and accommodation. No scleral icterus. Extraocular muscles intact.  HEENT: Head atraumatic, normocephalic. Oropharynx and nasopharynx clear.  NECK:  Supple, no jugular venous distention. No thyroid enlargement, no tenderness.  LUNGS: Coarse breath sounds bilaterally, no wheezing, rales or crepitation. Some upper airway scattered rhonchi present. No use of accessory muscles of respiration.  CARDIOVASCULAR: S1, S2 normal. No  rubs, or gallops. 3/6 systolic murmur present ABDOMEN: Soft, nontender, nondistended. Bowel sounds present. No organomegaly or mass.  EXTREMITIES: No pedal edema, cyanosis, or clubbing.  NEUROLOGIC: Cranial nerves II through XII are intact. Muscle strength 3-4/5 in all extremities. Sensation intact. Gait not checked. Global weakness noted. PSYCHIATRIC: The patient is alert and oriented x 3.  SKIN: No obvious rash, lesion, or ulcer.   DATA REVIEW:   CBC  Recent Labs Lab 04/04/17 0524  WBC 9.0  HGB 8.4*  HCT 25.1*  PLT 198    Chemistries   Recent Labs Lab 04/05/17 0519  NA 135  K 3.8  CL 95*  CO2 33*  GLUCOSE 109*  BUN 57*  CREATININE 1.86*  CALCIUM 8.8*    Cardiac Enzymes  Recent Labs Lab 04/02/17 0722  TROPONINI <0.03    Microbiology Results  Results for orders placed or performed during the hospital encounter of 04/01/17  MRSA PCR Screening     Status: None    Collection Time: 04/01/17  8:02 PM  Result Value Ref  Range Status   MRSA by PCR NEGATIVE NEGATIVE Final    Comment:        The GeneXpert MRSA Assay (FDA approved for NASAL specimens only), is one component of a comprehensive MRSA colonization surveillance program. It is not intended to diagnose MRSA infection nor to guide or monitor treatment for MRSA infections.     RADIOLOGY:  Dg Chest 2 View  Result Date: 04/05/2017 CLINICAL DATA:  Cough and shortness of Breath EXAM: CHEST  2 VIEW COMPARISON:  04/01/2017 cardiac shadow is again enlarged. Aortic calcifications are again seen. FINDINGS: Cardiac shadow is again enlarged. Aortic calcifications are noted. Chronic blunting of the costophrenic angle on the right is seen. Mild interstitial changes are noted. Mild right basilar changes are seen. Left humeral fracture is again noted. IMPRESSION: Mild right basilar atelectatic changes are seen. Mild vascular congestion is noted. Electronically Signed   By: Inez Catalina M.D.   On: 04/05/2017 07:34    EKG:   Orders placed or performed during the hospital encounter of 04/01/17  . ED EKG  . ED EKG  . EKG 12-Lead  . EKG 12-Lead      Management plans discussed with the patient, family and they are in agreement.  CODE STATUS:     Code Status Orders        Start     Ordered   04/01/17 1929  Full code  Continuous     04/01/17 1929    Code Status History    Date Active Date Inactive Code Status Order ID Comments User Context   02/18/2017 10:48 AM 02/21/2017  5:05 PM DNR [id number]  Loletha Grayer, MD ED   11/16/2016  6:31 PM 11/18/2016  7:41 PM DNR [id number]  Awilda Bill, NP ED   11/07/2016  2:54 PM 11/09/2016  9:28 PM DNR [id number]  Idelle Crouch, MD Inpatient   11/07/2016  2:13 PM 11/07/2016  2:54 PM Full Code [id number]  Idelle Crouch, MD Inpatient    Advance Directive Documentation     Most Recent Value  Type of Advance Directive  Living will, Healthcare Power of Attorney   Pre-existing out of facility DNR order (yellow form or pink MOST form)  -  "MOST" Form in Place?  -      TOTAL TIME TAKING CARE OF THIS PATIENT: 35 minutes.    [name] M.D on 04/06/2017 at 9:07 AM  Between 7am to 6pm - Pager - [phone]  After 6pm go to [email or web address] - password [email or web address] [facility]  Office  [phone]  CC: Primary care physician; Raelyn Number, MD   Note: This dictation was prepared with Dragon dictation along with smaller phrase technology. Any transcriptional errors that result from this process are unintentional.

## 2017-04-06 NOTE — Progress Notes (Signed)
Patient is being discharge to [facility], patient wanted to eat supper prior to transport per Education officer, museum,. Report called to receiving nurse, patient denies any pain at this time .

## 2017-04-06 NOTE — Progress Notes (Signed)
   04/06/17 1100  Clinical Encounter Type  Visited With Patient;Patient not available  Visit Type Initial  Referral From (morning rounds)   Chaplain knocked on door. Patient was sleeping. Will come later

## 2017-04-08 ENCOUNTER — Ambulatory Visit: Payer: Medicare Other | Admitting: Physician Assistant

## 2017-04-08 DIAGNOSIS — J9690 Respiratory failure, unspecified, unspecified whether with hypoxia or hypercapnia: Secondary | ICD-10-CM | POA: Diagnosis not present

## 2017-04-08 DIAGNOSIS — J441 Chronic obstructive pulmonary disease with (acute) exacerbation: Secondary | ICD-10-CM | POA: Diagnosis not present

## 2017-04-08 DIAGNOSIS — K589 Irritable bowel syndrome without diarrhea: Secondary | ICD-10-CM | POA: Diagnosis not present

## 2017-04-08 DIAGNOSIS — I10 Essential (primary) hypertension: Secondary | ICD-10-CM | POA: Diagnosis not present

## 2017-04-08 DIAGNOSIS — M6281 Muscle weakness (generalized): Secondary | ICD-10-CM | POA: Diagnosis not present

## 2017-04-08 DIAGNOSIS — F419 Anxiety disorder, unspecified: Secondary | ICD-10-CM | POA: Diagnosis not present

## 2017-04-08 DIAGNOSIS — I5033 Acute on chronic diastolic (congestive) heart failure: Secondary | ICD-10-CM | POA: Diagnosis not present

## 2017-04-09 DIAGNOSIS — L89152 Pressure ulcer of sacral region, stage 2: Secondary | ICD-10-CM | POA: Diagnosis not present

## 2017-04-15 ENCOUNTER — Other Ambulatory Visit: Payer: Self-pay | Admitting: *Deleted

## 2017-04-15 ENCOUNTER — Ambulatory Visit: Payer: Medicare Other | Admitting: Family

## 2017-04-15 NOTE — Patient Outreach (Signed)
[facility]) Care Management  04/15/2017  [name] [name] 1923-07-13 [id number]   Met with [name], SW at facility re: patient and to assess for [facility] care management.  He states that plan is for patient to have LTC placement, either at [facility] or another facility. The family is going to look at some facilities and make a decision.   Plan to sign off at this time as patient discharge plan is for LTC, requested [name] to let RNCM know if discharge plan changed, he agreed.  [name]. [name], RN, BSN, [place] [phone]) Business Cell  [phone]) Toll Free Office

## 2017-04-16 DIAGNOSIS — L97519 Non-pressure chronic ulcer of other part of right foot with unspecified severity: Secondary | ICD-10-CM | POA: Diagnosis not present

## 2017-04-20 DIAGNOSIS — M79672 Pain in left foot: Secondary | ICD-10-CM | POA: Diagnosis not present

## 2017-04-20 DIAGNOSIS — N189 Chronic kidney disease, unspecified: Secondary | ICD-10-CM | POA: Diagnosis not present

## 2017-04-20 DIAGNOSIS — F419 Anxiety disorder, unspecified: Secondary | ICD-10-CM | POA: Diagnosis not present

## 2017-04-20 DIAGNOSIS — M6281 Muscle weakness (generalized): Secondary | ICD-10-CM | POA: Diagnosis not present

## 2017-04-20 DIAGNOSIS — I10 Essential (primary) hypertension: Secondary | ICD-10-CM | POA: Diagnosis not present

## 2017-04-20 DIAGNOSIS — I5033 Acute on chronic diastolic (congestive) heart failure: Secondary | ICD-10-CM | POA: Diagnosis not present

## 2017-04-20 DIAGNOSIS — K589 Irritable bowel syndrome without diarrhea: Secondary | ICD-10-CM | POA: Diagnosis not present

## 2017-04-20 DIAGNOSIS — J449 Chronic obstructive pulmonary disease, unspecified: Secondary | ICD-10-CM | POA: Diagnosis not present

## 2017-04-23 DIAGNOSIS — I739 Peripheral vascular disease, unspecified: Secondary | ICD-10-CM | POA: Diagnosis not present

## 2017-04-23 DIAGNOSIS — L89890 Pressure ulcer of other site, unstageable: Secondary | ICD-10-CM | POA: Diagnosis not present

## 2017-04-23 DIAGNOSIS — I130 Hypertensive heart and chronic kidney disease with heart failure and stage 1 through stage 4 chronic kidney disease, or unspecified chronic kidney disease: Secondary | ICD-10-CM | POA: Diagnosis not present

## 2017-04-23 DIAGNOSIS — L89892 Pressure ulcer of other site, stage 2: Secondary | ICD-10-CM | POA: Diagnosis not present

## 2017-04-23 DIAGNOSIS — L89152 Pressure ulcer of sacral region, stage 2: Secondary | ICD-10-CM | POA: Diagnosis not present

## 2017-04-23 DIAGNOSIS — L97429 Non-pressure chronic ulcer of left heel and midfoot with unspecified severity: Secondary | ICD-10-CM | POA: Diagnosis not present

## 2017-04-24 ENCOUNTER — Encounter: Payer: Self-pay | Admitting: *Deleted

## 2017-04-24 ENCOUNTER — Emergency Department: Payer: Medicare Other

## 2017-04-24 ENCOUNTER — Inpatient Hospital Stay
Admission: EM | Admit: 2017-04-24 | Discharge: 2017-04-27 | DRG: 193 | Disposition: A | Discharge status: Skilled Nursing Facility | Payer: Medicare Other | Attending: Internal Medicine | Admitting: Internal Medicine

## 2017-04-24 DIAGNOSIS — I2510 Atherosclerotic heart disease of native coronary artery without angina pectoris: Secondary | ICD-10-CM | POA: Diagnosis present

## 2017-04-24 DIAGNOSIS — I10 Essential (primary) hypertension: Secondary | ICD-10-CM | POA: Diagnosis not present

## 2017-04-24 DIAGNOSIS — R5383 Other fatigue: Secondary | ICD-10-CM | POA: Diagnosis not present

## 2017-04-24 DIAGNOSIS — L97921 Non-pressure chronic ulcer of unspecified part of left lower leg limited to breakdown of skin: Secondary | ICD-10-CM | POA: Diagnosis not present

## 2017-04-24 DIAGNOSIS — I509 Heart failure, unspecified: Secondary | ICD-10-CM | POA: Diagnosis present

## 2017-04-24 DIAGNOSIS — Z7902 Long term (current) use of antithrombotics/antiplatelets: Secondary | ICD-10-CM

## 2017-04-24 DIAGNOSIS — Z79899 Other long term (current) drug therapy: Secondary | ICD-10-CM

## 2017-04-24 DIAGNOSIS — R531 Weakness: Secondary | ICD-10-CM | POA: Diagnosis not present

## 2017-04-24 DIAGNOSIS — I130 Hypertensive heart and chronic kidney disease with heart failure and stage 1 through stage 4 chronic kidney disease, or unspecified chronic kidney disease: Secondary | ICD-10-CM | POA: Diagnosis present

## 2017-04-24 DIAGNOSIS — I739 Peripheral vascular disease, unspecified: Secondary | ICD-10-CM | POA: Diagnosis present

## 2017-04-24 DIAGNOSIS — J189 Pneumonia, unspecified organism: Secondary | ICD-10-CM | POA: Diagnosis not present

## 2017-04-24 DIAGNOSIS — R1312 Dysphagia, oropharyngeal phase: Secondary | ICD-10-CM | POA: Diagnosis not present

## 2017-04-24 DIAGNOSIS — W19XXXD Unspecified fall, subsequent encounter: Secondary | ICD-10-CM | POA: Diagnosis present

## 2017-04-24 DIAGNOSIS — M810 Age-related osteoporosis without current pathological fracture: Secondary | ICD-10-CM | POA: Diagnosis present

## 2017-04-24 DIAGNOSIS — N184 Chronic kidney disease, stage 4 (severe): Secondary | ICD-10-CM | POA: Diagnosis present

## 2017-04-24 DIAGNOSIS — R05 Cough: Secondary | ICD-10-CM | POA: Diagnosis not present

## 2017-04-24 DIAGNOSIS — S42302D Unspecified fracture of shaft of humerus, left arm, subsequent encounter for fracture with routine healing: Secondary | ICD-10-CM

## 2017-04-24 DIAGNOSIS — J439 Emphysema, unspecified: Secondary | ICD-10-CM | POA: Diagnosis present

## 2017-04-24 DIAGNOSIS — F064 Anxiety disorder due to known physiological condition: Secondary | ICD-10-CM | POA: Diagnosis not present

## 2017-04-24 DIAGNOSIS — Z8673 Personal history of transient ischemic attack (TIA), and cerebral infarction without residual deficits: Secondary | ICD-10-CM

## 2017-04-24 DIAGNOSIS — M6281 Muscle weakness (generalized): Secondary | ICD-10-CM | POA: Diagnosis not present

## 2017-04-24 DIAGNOSIS — E785 Hyperlipidemia, unspecified: Secondary | ICD-10-CM | POA: Diagnosis present

## 2017-04-24 DIAGNOSIS — Z7982 Long term (current) use of aspirin: Secondary | ICD-10-CM | POA: Diagnosis not present

## 2017-04-24 DIAGNOSIS — I5020 Unspecified systolic (congestive) heart failure: Secondary | ICD-10-CM | POA: Diagnosis not present

## 2017-04-24 DIAGNOSIS — R21 Rash and other nonspecific skin eruption: Secondary | ICD-10-CM | POA: Diagnosis not present

## 2017-04-24 DIAGNOSIS — R630 Anorexia: Secondary | ICD-10-CM | POA: Diagnosis present

## 2017-04-24 DIAGNOSIS — G629 Polyneuropathy, unspecified: Secondary | ICD-10-CM | POA: Diagnosis present

## 2017-04-24 DIAGNOSIS — Z66 Do not resuscitate: Secondary | ICD-10-CM | POA: Diagnosis present

## 2017-04-24 DIAGNOSIS — J441 Chronic obstructive pulmonary disease with (acute) exacerbation: Secondary | ICD-10-CM | POA: Diagnosis not present

## 2017-04-24 DIAGNOSIS — R51 Headache: Secondary | ICD-10-CM | POA: Diagnosis not present

## 2017-04-24 DIAGNOSIS — J9621 Acute and chronic respiratory failure with hypoxia: Secondary | ICD-10-CM | POA: Diagnosis not present

## 2017-04-24 DIAGNOSIS — A419 Sepsis, unspecified organism: Secondary | ICD-10-CM | POA: Diagnosis not present

## 2017-04-24 DIAGNOSIS — K219 Gastro-esophageal reflux disease without esophagitis: Secondary | ICD-10-CM | POA: Diagnosis present

## 2017-04-24 DIAGNOSIS — Z9981 Dependence on supplemental oxygen: Secondary | ICD-10-CM | POA: Diagnosis not present

## 2017-04-24 DIAGNOSIS — Z5189 Encounter for other specified aftercare: Secondary | ICD-10-CM | POA: Diagnosis not present

## 2017-04-24 DIAGNOSIS — R498 Other voice and resonance disorders: Secondary | ICD-10-CM | POA: Diagnosis not present

## 2017-04-24 DIAGNOSIS — Y95 Nosocomial condition: Secondary | ICD-10-CM | POA: Diagnosis present

## 2017-04-24 DIAGNOSIS — J9620 Acute and chronic respiratory failure, unspecified whether with hypoxia or hypercapnia: Secondary | ICD-10-CM | POA: Diagnosis present

## 2017-04-24 DIAGNOSIS — Z7401 Bed confinement status: Secondary | ICD-10-CM | POA: Diagnosis not present

## 2017-04-24 LAB — CBC WITH DIFFERENTIAL/PLATELET
BASOS PCT: 0 %
Basophils Absolute: 0.1 10*3/uL (ref 0–0.1)
Eosinophils Absolute: 0.1 10*3/uL (ref 0–0.7)
Eosinophils Relative: 1 %
HEMATOCRIT: 27.5 % — AB (ref 35.0–47.0)
HEMOGLOBIN: 9.4 g/dL — AB (ref 12.0–16.0)
Lymphocytes Relative: 5 %
Lymphs Abs: 0.9 10*3/uL — ABNORMAL LOW (ref 1.0–3.6)
MCH: 33.6 pg (ref 26.0–34.0)
MCHC: 34.2 g/dL (ref 32.0–36.0)
MCV: 98.4 fL (ref 80.0–100.0)
Monocytes Absolute: 1.1 10*3/uL — ABNORMAL HIGH (ref 0.2–0.9)
Monocytes Relative: 6 %
NEUTROS ABS: 16.2 10*3/uL — AB (ref 1.4–6.5)
NEUTROS PCT: 88 %
Platelets: 241 10*3/uL (ref 150–440)
RBC: 2.8 MIL/uL — AB (ref 3.80–5.20)
RDW: 14.2 % (ref 11.5–14.5)
WBC: 18.5 10*3/uL — AB (ref 3.6–11.0)

## 2017-04-24 LAB — URINALYSIS, COMPLETE (UACMP) WITH MICROSCOPIC
BACTERIA UA: NONE SEEN
BILIRUBIN URINE: NEGATIVE
Glucose, UA: NEGATIVE mg/dL
Hgb urine dipstick: NEGATIVE
Ketones, ur: NEGATIVE mg/dL
LEUKOCYTES UA: NEGATIVE
Nitrite: NEGATIVE
Protein, ur: NEGATIVE mg/dL
SQUAMOUS EPITHELIAL / LPF: NONE SEEN
Specific Gravity, Urine: 1.008 (ref 1.005–1.030)
WBC, UA: NONE SEEN WBC/hpf (ref 0–5)
pH: 6 (ref 5.0–8.0)

## 2017-04-24 LAB — COMPREHENSIVE METABOLIC PANEL
ALBUMIN: 3.2 g/dL — AB (ref 3.5–5.0)
ALK PHOS: 130 U/L — AB (ref 38–126)
ALT: 10 U/L — AB (ref 14–54)
AST: 29 U/L (ref 15–41)
Anion gap: 10 (ref 5–15)
BUN: 55 mg/dL — AB (ref 6–20)
CO2: 29 mmol/L (ref 22–32)
CREATININE: 1.85 mg/dL — AB (ref 0.44–1.00)
Calcium: 9.6 mg/dL (ref 8.9–10.3)
Chloride: 93 mmol/L — ABNORMAL LOW (ref 101–111)
GFR calc Af Amer: 26 mL/min — ABNORMAL LOW (ref >60)
GFR calc non Af Amer: 23 mL/min — ABNORMAL LOW (ref >60)
GLUCOSE: 143 mg/dL — AB (ref 65–99)
Potassium: 4.8 mmol/L (ref 3.5–5.1)
SODIUM: 132 mmol/L — AB (ref 135–145)
Total Bilirubin: 1.1 mg/dL (ref 0.3–1.2)
Total Protein: 6.9 g/dL (ref 6.5–8.1)

## 2017-04-24 LAB — TROPONIN I

## 2017-04-24 MED ORDER — SENNOSIDES-DOCUSATE SODIUM 8.6-50 MG PO TABS
2.0000 | ORAL_TABLET | Freq: Every day | ORAL | Status: DC
Start: 2017-04-25 — End: 2017-04-27
  Administered 2017-04-25 – 2017-04-27 (×3): 2 via ORAL
  Filled 2017-04-24 (×3): qty 2

## 2017-04-24 MED ORDER — BACID PO TABS
2.0000 | ORAL_TABLET | Freq: Three times a day (TID) | ORAL | Status: DC
  Administered 2017-04-25 – 2017-04-27 (×7): 2 via ORAL
  Filled 2017-04-24 (×10): qty 2

## 2017-04-24 MED ORDER — DEXTROSE 5 % IV SOLN
2.0000 g | Freq: Once | INTRAVENOUS | Status: AC
  Administered 2017-04-24: 2 g via INTRAVENOUS
  Filled 2017-04-24: qty 2

## 2017-04-24 MED ORDER — DEXTROSE 5 % IV SOLN
1.0000 g | INTRAVENOUS | Status: DC
  Administered 2017-04-25 – 2017-04-26 (×2): 1 g via INTRAVENOUS
  Filled 2017-04-24 (×3): qty 1

## 2017-04-24 MED ORDER — TRAMADOL HCL 50 MG PO TABS
50.0000 mg | ORAL_TABLET | Freq: Two times a day (BID) | ORAL | Status: DC | PRN
  Administered 2017-04-24 – 2017-04-26 (×2): 50 mg via ORAL
  Filled 2017-04-24: qty 1

## 2017-04-24 MED ORDER — FAMOTIDINE 20 MG PO TABS: 20.0000 mg | ORAL_TABLET | Freq: Two times a day (BID) | ORAL | Status: DC

## 2017-04-24 MED ORDER — ENSURE ENLIVE PO LIQD
237.0000 mL | Freq: Every day | ORAL | Status: DC
  Administered 2017-04-25 – 2017-04-27 (×3): 237 mL via ORAL

## 2017-04-24 MED ORDER — ASPIRIN EC 81 MG PO TBEC
81.0000 mg | DELAYED_RELEASE_TABLET | Freq: Every day | ORAL | Status: DC
  Administered 2017-04-25 – 2017-04-27 (×3): 81 mg via ORAL
  Filled 2017-04-24 (×3): qty 1

## 2017-04-24 MED ORDER — GABAPENTIN 600 MG PO TABS
ORAL_TABLET | ORAL | Status: AC
  Filled 2017-04-24: qty 1

## 2017-04-24 MED ORDER — FLUTICASONE PROPIONATE 50 MCG/ACT NA SUSP
2.0000 | Freq: Every day | NASAL | Status: DC
  Administered 2017-04-25 – 2017-04-27 (×3): 2 via NASAL
  Filled 2017-04-24: qty 16

## 2017-04-24 MED ORDER — ALPRAZOLAM 0.25 MG PO TABS
0.1250 mg | ORAL_TABLET | Freq: Two times a day (BID) | ORAL | Status: DC | PRN
  Administered 2017-04-25 – 2017-04-26 (×2): 0.125 mg via ORAL
  Filled 2017-04-24 (×4): qty 1

## 2017-04-24 MED ORDER — CLOPIDOGREL BISULFATE 75 MG PO TABS
75.0000 mg | ORAL_TABLET | Freq: Every day | ORAL | Status: DC
  Administered 2017-04-25 – 2017-04-27 (×3): 75 mg via ORAL
  Filled 2017-04-24 (×4): qty 1

## 2017-04-24 MED ORDER — LACTOBACILLUS PO TABS: ORAL_TABLET | Freq: Every day | ORAL | Status: DC

## 2017-04-24 MED ORDER — GABAPENTIN 400 MG PO CAPS
400.0000 mg | ORAL_CAPSULE | Freq: Every day | ORAL | Status: DC
  Administered 2017-04-24: 400 mg via ORAL

## 2017-04-24 MED ORDER — DOCUSATE SODIUM 100 MG PO CAPS: 100.0000 mg | ORAL_CAPSULE | Freq: Two times a day (BID) | ORAL | Status: DC | PRN

## 2017-04-24 MED ORDER — TRIPLE ANTIBIOTIC 3.5-400-5000 EX OINT
1.0000 "application " | TOPICAL_OINTMENT | Freq: Every day | CUTANEOUS | Status: DC
  Administered 2017-04-25 – 2017-04-27 (×3): 1 via TOPICAL
  Filled 2017-04-24 (×3): qty 1

## 2017-04-24 MED ORDER — VANCOMYCIN HCL IN DEXTROSE 1-5 GM/200ML-% IV SOLN
1000.0000 mg | INTRAVENOUS | Status: DC
  Administered 2017-04-26: 21:00:00 1000 mg via INTRAVENOUS
  Filled 2017-04-24: qty 200

## 2017-04-24 MED ORDER — IPRATROPIUM-ALBUTEROL 0.5-2.5 (3) MG/3ML IN SOLN
3.0000 mL | RESPIRATORY_TRACT | Status: DC
  Administered 2017-04-25: 01:00:00 3 mL via RESPIRATORY_TRACT
  Filled 2017-04-24: qty 3

## 2017-04-24 MED ORDER — GABAPENTIN 100 MG PO CAPS
200.0000 mg | ORAL_CAPSULE | Freq: Three times a day (TID) | ORAL | Status: DC
  Administered 2017-04-24 – 2017-04-27 (×8): 200 mg via ORAL
  Filled 2017-04-24 (×7): qty 2

## 2017-04-24 MED ORDER — HEPARIN SODIUM (PORCINE) 5000 UNIT/ML IJ SOLN
5000.0000 [IU] | Freq: Three times a day (TID) | INTRAMUSCULAR | Status: DC
  Administered 2017-04-25 – 2017-04-27 (×8): 5000 [IU] via SUBCUTANEOUS
  Filled 2017-04-24 (×8): qty 1

## 2017-04-24 MED ORDER — CARVEDILOL 25 MG PO TABS
25.0000 mg | ORAL_TABLET | Freq: Two times a day (BID) | ORAL | Status: DC
  Administered 2017-04-25 – 2017-04-27 (×6): 25 mg via ORAL
  Filled 2017-04-24 (×6): qty 1

## 2017-04-24 MED ORDER — ONDANSETRON HCL 4 MG PO TABS: 4.0000 mg | ORAL_TABLET | Freq: Three times a day (TID) | ORAL | Status: DC | PRN

## 2017-04-24 MED ORDER — POLYETHYLENE GLYCOL 3350 17 G PO PACK
17.0000 g | PACK | Freq: Every day | ORAL | Status: DC
  Administered 2017-04-25 – 2017-04-27 (×3): 17 g via ORAL
  Filled 2017-04-24 (×3): qty 1

## 2017-04-24 MED ORDER — TRAMADOL HCL 50 MG PO TABS
ORAL_TABLET | ORAL | Status: AC
  Administered 2017-04-24: 50 mg via ORAL
  Filled 2017-04-24: qty 1

## 2017-04-24 MED ORDER — VANCOMYCIN HCL IN DEXTROSE 1-5 GM/200ML-% IV SOLN
1000.0000 mg | Freq: Once | INTRAVENOUS | Status: AC
  Administered 2017-04-24: 1000 mg via INTRAVENOUS
  Filled 2017-04-24: qty 200

## 2017-04-24 MED ORDER — ACETAMINOPHEN 325 MG PO TABS
650.0000 mg | ORAL_TABLET | Freq: Four times a day (QID) | ORAL | Status: DC | PRN
  Administered 2017-04-27: 04:00:00 650 mg via ORAL
  Filled 2017-04-24: qty 2

## 2017-04-24 MED ORDER — VITAMIN B-12 1000 MCG PO TABS
1000.0000 ug | ORAL_TABLET | Freq: Every day | ORAL | Status: DC
  Administered 2017-04-25 – 2017-04-27 (×3): 1000 ug via ORAL
  Filled 2017-04-24 (×3): qty 1

## 2017-04-24 MED ORDER — FUROSEMIDE 20 MG PO TABS
20.0000 mg | ORAL_TABLET | Freq: Every day | ORAL | Status: DC
  Administered 2017-04-25 – 2017-04-27 (×3): 20 mg via ORAL
  Filled 2017-04-24 (×3): qty 1

## 2017-04-24 MED ORDER — TIOTROPIUM BROMIDE MONOHYDRATE 18 MCG IN CAPS
18.0000 ug | ORAL_CAPSULE | Freq: Every day | RESPIRATORY_TRACT | Status: DC
  Administered 2017-04-25 – 2017-04-27 (×3): 18 ug via RESPIRATORY_TRACT
  Filled 2017-04-24: qty 5

## 2017-04-24 NOTE — Progress Notes (Signed)
Pharmacy Antibiotic Note  [name] [name] is a 83 y.o. female admitted on 04/24/2017 with PNA.  Pharmacy has been consulted for cefepime and vancomycin dosing.  Plan: 1. Cefepime 2 gm IV x 1 in ED followed by cefepime 1 gm IV Q24H 2. Vancomycin 1 gm IV x 1 in ED followed in approximately 24 hours (stacked dosing) by vancomycin 1 gm IV Q48H, predicted trough 16 mcg/mL. Pharmacy will continue to follow and adjust as needed to maintain trough 15 to 20 mcg/mL.  Vd 41.1 L, Ke 0.019 hr-1, T1/2 35.8 L  Height: 5\' 6"  (167.6 cm) Weight: 137 lb (62.1 kg) IBW/kg (Calculated) : 59.3  Temp (24hrs), Avg:98.4 F (36.9 C), Min:98.4 F (36.9 C), Max:98.4 F (36.9 C)   Recent Labs Lab 04/24/17 1851  WBC 18.5*  CREATININE 1.85*    Estimated Creatinine Clearance: 18.2 mL/min (A) (by C-G formula based on SCr of 1.85 mg/dL (H)).    Allergies  Allergen Reactions  . Cholesterol Other (See Comments)    Patient states she is allergic to a cholesterol medication, which causes flu-like symptoms. Name is unknown.  . Morphine And Related     Heart problems   . Zoloft [Sertraline Hcl] Swelling    Thank you for allowing pharmacy to be a part of this patient's care.  Laural Benes, Pharm.D., BCPS Clinical Pharmacist 04/24/2017 8:55 PM

## 2017-04-24 NOTE — ED Notes (Signed)
Pt transport to 103 

## 2017-04-24 NOTE — Progress Notes (Signed)
Family Meeting Note  Advance Directive:yes  Today a meeting took place with the Patient and daughter.  The following clinical team members were present during this meeting:MD  The following were discussed:Patient's diagnosis: COPD, Ch oxygen, Pneumonia, decreased appetite , Patient's progosis: Unable to determine and Goals for treatment: DNR  Additional follow-up to be provided: PMD  Time spent during discussion:20 minutes  Kyleeann Cremeans, Rosalio Macadamia, MD

## 2017-04-24 NOTE — ED Provider Notes (Signed)
[facility] Emergency Department Provider Note  Time seen: 7:13 PM  I have reviewed the triage vital signs and the nursing notes.   HISTORY  Chief Complaint Weakness    HPI [name] [name] is a 83 y.o. female presents to the emergency department from her rehabilitation facility for generalized weakness and fatigue. According to the patient for the past 2 days she has felt a feeling of generalized fatigue and weakness. States she has been very somnolent and sleeping throughout most the day. Had very little appetite and not eating or drinking much. Patient states a slight cough states this is fairly chronic for her. Denies any shortness of breath or chest pain. Denies abdominal pain nausea vomiting or diarrhea. Denies dysuria or cloudy urine. Patient does have a chronic left foot ulceration which she states has slightly worsened but this is a chronic issue for her, previously cared for at the wound clinic, now being [facility].    Past Medical History:  Diagnosis Date  . Anemia   . CHF (congestive heart failure) ([place])   . Coronary artery disease   . Emphysema lung ([place])   . Emphysema of lung ([place])   . GERD (gastroesophageal reflux disease)   . Hyperlipidemia   . Hypertension   . Neuropathy   . Osteoporosis   . Peripheral vascular disease ([place])   . Renal disorder   . Stroke [facility])     Patient Active Problem List   Diagnosis Date Noted  . Acute on chronic diastolic CHF (congestive heart failure) ([place]) 04/01/2017  . Acute on chronic respiratory failure with hypoxia ([place]) 04/01/2017  . Malignant hypertension 04/01/2017  . Hyponatremia 04/01/2017  . Hyperkalemia 04/01/2017  . COPD exacerbation ([place]) 02/18/2017  . Pressure injury of skin 02/18/2017  . Chronic diastolic heart failure ([place]) 12/15/2016  . HTN (hypertension) 12/15/2016  . Hypotension 11/16/2016  . CAP (community acquired pneumonia) 11/07/2016  . Generalized anxiety disorder  09/26/2016  . PAD (peripheral artery disease) ([place]) 10/05/2013  . Chronic venous insufficiency 10/05/2013    Past Surgical History:  Procedure Laterality Date  . ABDOMINAL HYSTERECTOMY  1982  . CATARACT EXTRACTION    . CHOLECYSTECTOMY  1992  . JOINT REPLACEMENT  2005    Prior to Admission medications   Medication Sig Start Date End Date Taking? Authorizing Provider  acetaminophen (TYLENOL) 325 MG tablet Take 650 mg by mouth every 6 (six) hours as needed for mild pain or moderate pain.    [provider]  albuterol (PROVENTIL) (2.5 MG/3ML) 0.083% nebulizer solution Take 3 mLs (2.5 mg total) by nebulization every 6 (six) hours as needed for wheezing or shortness of breath. 04/06/17   [name], MD  ALPRAZolam Duanne Moron) 0.25 MG tablet Take 0.5 tablets (0.125 mg total) by mouth 2 (two) times daily as needed for anxiety or sleep. 04/06/17   [name], MD  aspirin 81 MG tablet Take 81 mg by mouth daily.    [provider]  carvedilol (COREG) 25 MG tablet Take 1 tablet (25 mg total) by mouth 2 (two) times daily. 02/20/17   Bettey Costa, MD  celecoxib (CELEBREX) 100 MG capsule Take 100 mg by mouth every other day as needed. 10/15/16   [provider]  Cholecalciferol 2000 units CAPS Take 4,000 Units by mouth daily.     [provider]  clopidogrel (PLAVIX) 75 MG tablet Take 1 tablet by mouth daily. 09/18/13   [provider]  cyanocobalamin 1000 MCG  tablet Take 1,000 mcg by mouth daily.     [provider]  dicyclomine (BENTYL) 20 MG tablet Take 1 tablet (20 mg total) by mouth 3 (three) times daily as needed for spasms. 11/28/15   Daymon Larsen, MD  feeding supplement (BOOST HIGH PROTEIN) LIQD Take 1 Container by mouth daily.    [provider]  FLECTOR 1.3 % [facility] Place 1 patch onto the skin every 12 (twelve) hours.  09/18/13   [provider]  fluticasone (FLONASE) 50 MCG/ACT nasal spray Place 2 sprays into  both nostrils daily.    [provider]  furosemide (LASIX) 20 MG tablet Take 20 mg by mouth daily.    [provider]  gabapentin (NEURONTIN) 100 MG capsule Take 2 capsules (200 mg total) by mouth 3 (three) times daily. 04/06/17   [name], MD  loratadine (CLARITIN) 10 MG tablet Take 10 mg by mouth daily.    [provider]  neomycin-bacitracin-polymyxin (NEOSPORIN) [phone] ointment Apply 1 application topically daily.    [provider]  nitroGLYCERIN (NITRODUR - DOSED IN MG/24 HR) 0.2 mg/hr patch Place 1 patch onto the skin daily.  09/18/13   [provider]  ondansetron (ZOFRAN) 4 MG tablet Take 1 tablet (4 mg total) by mouth every 8 (eight) hours as needed for nausea or vomiting. 11/28/15   Daymon Larsen, MD  polyethylene glycol [facility] / Floria Raveling) packet Take 17 g by mouth daily.    [provider]  potassium chloride SA (K-DUR,KLOR-CON) 10 MEQ tablet Take 1 tablet (10 mEq total) by mouth daily. 11/09/16   [name], MD  Probiotic Product (ALIGN) 4 MG CAPS Take 4 mg by mouth daily.    [provider]  ranitidine (ZANTAC) 150 MG capsule Take 150 mg by mouth 2 (two) times daily.    [provider]  sennosides-docusate sodium (SENOKOT-S) 8.6-50 MG tablet Take 2 tablets by mouth daily.    [provider]  Spacer/Aero-Hold Chamber Mask MISC 1 Units by Does not apply route every 6 (six) hours as needed. 02/15/17   Darel Hong, MD  tiotropium (SPIRIVA) 18 MCG inhalation capsule Place 1 capsule (18 mcg total) into inhaler and inhale daily. 04/06/17   [name], MD  traMADol (ULTRAM) 50 MG tablet Take 1 tablet (50 mg total) by mouth every 12 (twelve) hours as needed for moderate pain or severe pain. 04/06/17   [name], MD    Allergies  Allergen Reactions  . Cholesterol Other (See Comments)    Patient states she is allergic to a cholesterol medication, which  causes flu-like symptoms. Name is unknown.  . Morphine And Related     Heart problems   . Zoloft [Sertraline Hcl] Swelling    Family History  Problem Relation Age of Onset  . Colon cancer Sister   . CVA Mother   . CAD Mother   . Hypertension Mother     Social History Social History  Substance Use Topics  . Smoking status: Never Smoker  . Smokeless tobacco: Never Used  . Alcohol use No    Review of Systems Constitutional: Negative for fever. Cardiovascular: Negative for chest pain. Respiratory: Negative for shortness of breath.Mild cough. Gastrointestinal: Negative for abdominal pain, vomiting and diarrhea. Genitourinary: Negative for dysuria. Musculoskeletal: Negative for back pain. Skin: Left foot ulceration. Neurological: Negative for headache All other ROS negative  ____________________________________________   PHYSICAL EXAM:  VITAL SIGNS: ED Triage Vitals  Enc Vitals Group  BP 04/24/17 1833 (!) 164/87     Pulse Rate 04/24/17 1833 94     Resp 04/24/17 1833 20     Temp 04/24/17 1833 98.4 F (36.9 C)     Temp Source 04/24/17 1833 Oral     SpO2 04/24/17 1833 99 %     Weight 04/24/17 1833 137 lb (62.1 kg)     Height 04/24/17 1833 5\' 6"  (1.676 m)     Head Circumference --      [facility] --      Pain Score 04/24/17 1828 5     Pain Loc --      Pain Edu? --      Excl. in [place]? --     Constitutional: Alert and oriented. Well appearing and in no distress. Eyes: Normal exam ENT   Head: Normocephalic and atraumatic.   Mouth/Throat: Mucous membranes are moist. Cardiovascular: Normal rate, regular rhythm. No murmur Respiratory: Normal respiratory effort without tachypnea nor retractions. Breath sounds are clear, occasional cough. Gastrointestinal: Soft and nontender. No distention.  Musculoskeletal: Nontender with normal range of motion in all extremities. Left foot ulceration which appears chronic with granulation tissue, approximately 2 cm in  diameter. There is small ulceration to the second of the right foot. Neurologic:  Normal speech and language. No gross focal neurologic deficits  Skin:  Skin is warm, dry and intact.  Psychiatric: Mood and affect are normal.  ____________________________________________    EKG  EKG reviewed and interpreted by myself shows normal sinus rhythm at 92 bpm, narrow QRS, normal axis, normal intervals, no concerning ST changes.  ____________________________________________    RADIOLOGY  X-ray shows bilateral pneumonia  ____________________________________________   INITIAL IMPRESSION / ASSESSMENT AND PLAN / ED COURSE  Pertinent labs & imaging results that were available during my care of the patient were reviewed by me and considered in my medical decision making (see chart for details).  The patient presents the emergency department for generalized weakness and fatigue. Patient does state mild cough as well as a worsening left foot ulceration. We will obtain x-rays of the chest as well as the left foot. We will check labs including urinalysis and continue to closely monitor in the emergency department.  Patient is x-ray shows bilateral pneumonia, foot x-rays negative. Patient's white blood cell count is significantly elevated, heart rate is elevated, patient meets sepsis criteria with significant weakness and fatigue over the past 2 days we'll admit to the hospital for continued IV antibiotics. Patient agreeable to plan.  CRITICAL CARE Performed by: Harvest Dark   Total critical care time: 30 minutes  Critical care time was exclusive of separately billable procedures and treating other patients.  Critical care was necessary to treat or prevent imminent or life-threatening deterioration.  Critical care was time spent personally by me on the following activities: development of treatment plan with patient and/or surrogate as well as nursing, discussions with consultants,  evaluation of patient's response to treatment, examination of patient, obtaining history from patient or surrogate, ordering and performing treatments and interventions, ordering and review of laboratory studies, ordering and review of radiographic studies, pulse oximetry and re-evaluation of patient's condition.   ____________________________________________   FINAL CLINICAL IMPRESSION(S) / ED DIAGNOSES  Generalized weakness Health care associated pneumonia   Harvest Dark, MD 04/24/17 2045

## 2017-04-24 NOTE — ED Triage Notes (Signed)
t to ED from [facility] reporting increased weakness and fatigue since yesterday with a 5/10 headache at the moment. Pt reports having had a decreased appetite since Thursday but also reports having pain in mouth and jaw and reports feeling as though she has something in her throat. Swelling noted to left side of throat upon assessment. No tenderness upon palpation.   Pt recently finished treatment for a UTI. PT has ulcer on top of left foot being treated. Ulcer has foul odor and serous drainage. Pt also has smaller similar ulcer on right second toe.

## 2017-04-24 NOTE — ED Notes (Signed)
Admitting MD at the bedside.  

## 2017-04-24 NOTE — H&P (Signed)
[place] [facility] [place] NAME: [name] [name]    MR#:  [id number]  DATE OF BIRTH:  1922-12-29  DATE OF ADMISSION:  04/24/2017  PRIMARY CARE PHYSICIAN: Raelyn Number, MD   REQUESTING/REFERRING PHYSICIAN: Paduchowwski  CHIEF COMPLAINT:   Chief Complaint  Patient presents with  . Weakness    HISTORY OF PRESENT ILLNESS: [name] [name]  is a 83 y.o. female with a known history of CHF, coronary artery disease, emphysema, COPD, hyperlipidemia, hypertension, renal disorder, stroke -was admitted in hospital 3 weeks ago and sent to rehabilitation after treating for COPD. At her baseline she is on oxygen at home. At rehabilitation she still requiring a lot of support and therapy for her day to day activities. For last few days she has decreased oral appetite and staying mostly sleepy. Also complaining of headache and her ulcers on her leg is getting worse. Concerned with this she was brought to emergency room and she was noted to have elevated white blood cell count with hypoxia and some tachycardia and pneumonia on chest x-ray which is new compared to the previous x-ray.  PAST MEDICAL HISTORY:   Past Medical History:  Diagnosis Date  . Anemia   . CHF (congestive heart failure) ([place])   . Coronary artery disease   . Emphysema lung ([place])   . Emphysema of lung ([place])   . GERD (gastroesophageal reflux disease)   . Hyperlipidemia   . Hypertension   . Neuropathy   . Osteoporosis   . Peripheral vascular disease ([place])   . Renal disorder   . Stroke [facility])     PAST SURGICAL HISTORY: Past Surgical History:  Procedure Laterality Date  . ABDOMINAL HYSTERECTOMY  1982  . CATARACT EXTRACTION    . CHOLECYSTECTOMY  1992  . JOINT REPLACEMENT  2005    SOCIAL HISTORY:  Social History  Substance Use Topics  . Smoking status: Never Smoker  . Smokeless tobacco: Never Used  . Alcohol use No    FAMILY HISTORY:  Family History  Problem Relation Age of Onset  .  Colon cancer Sister   . CVA Mother   . CAD Mother   . Hypertension Mother   . CAD Father     DRUG ALLERGIES:  Allergies  Allergen Reactions  . Cholesterol Other (See Comments)    Patient states she is allergic to a cholesterol medication, which causes flu-like symptoms. Name is unknown.  . Morphine And Related     Heart problems   . Zoloft [Sertraline Hcl] Swelling    REVIEW OF SYSTEMS:   CONSTITUTIONAL: No fever, fatigue or weakness.  EYES: No blurred or double vision.  EARS, NOSE, AND THROAT: No tinnitus or ear pain.  RESPIRATORY:Positive for  cough, shortness of breath,no  wheezing or hemoptysis.  CARDIOVASCULAR: No chest pain, orthopnea, edema.  GASTROINTESTINAL: No nausea, vomiting, diarrhea or abdominal pain.  GENITOURINARY: No dysuria, hematuria.  ENDOCRINE: No polyuria, nocturia,  HEMATOLOGY: No anemia, easy bruising or bleeding SKIN: No rash or lesion. MUSCULOSKELETAL: No joint pain or arthritis.   NEUROLOGIC: No tingling, numbness,she have generalized  weakness.  PSYCHIATRY: No anxiety or depression.   MEDICATIONS AT HOME:  Prior to Admission medications   Medication Sig Start Date End Date Taking? Authorizing Provider  acetaminophen (TYLENOL) 325 MG tablet Take 650 mg by mouth every 6 (six) hours as needed for mild pain or moderate pain.   Yes [provider]  albuterol (PROVENTIL) (2.5 MG/3ML) 0.083% nebulizer solution Take  3 mLs (2.5 mg total) by nebulization every 6 (six) hours as needed for wheezing or shortness of breath. 04/06/17  Yes [name], MD  albuterol (PROVENTIL) (2.5 MG/3ML) 0.083% nebulizer solution Take 2.5 mg by nebulization 2 (two) times daily.   Yes [provider]  ALPRAZolam (XANAX) 0.25 MG tablet Take 0.5 tablets (0.125 mg total) by mouth 2 (two) times daily as needed for anxiety or sleep. 04/06/17  Yes [name], MD  amoxicillin-clavulanate (AUGMENTIN) 500-125 MG tablet Take 1 tablet by mouth 2 (two)  times daily. 04/23/17 05/03/17 Yes [provider]  aspirin 81 MG tablet Take 81 mg by mouth daily.   Yes [provider]  carvedilol (COREG) 25 MG tablet Take 1 tablet (25 mg total) by mouth 2 (two) times daily. 02/20/17  Yes Mody, Ulice Bold, MD  celecoxib (CELEBREX) 100 MG capsule Take 100 mg by mouth every other day as needed. 10/15/16  Yes [provider]  Cholecalciferol 2000 units CAPS Take 4,000 Units by mouth daily.    Yes [provider]  clopidogrel (PLAVIX) 75 MG tablet Take 1 tablet by mouth daily. 09/18/13  Yes [provider]  cyanocobalamin 1000 MCG tablet Take 1,000 mcg by mouth daily.    Yes [provider]  dicyclomine (BENTYL) 20 MG tablet Take 1 tablet (20 mg total) by mouth 3 (three) times daily as needed for spasms. 11/28/15  Yes Daymon Larsen, MD  FLECTOR 1.3 % [facility] Place 1 patch onto the skin every 12 (twelve) hours.  09/18/13  Yes [provider]  fluticasone (FLONASE) 50 MCG/ACT nasal spray Place 2 sprays into both nostrils daily.   Yes [provider]  furosemide (LASIX) 20 MG tablet Take 20 mg by mouth daily.   Yes [provider]  gabapentin (NEURONTIN) 100 MG capsule Take 2 capsules (200 mg total) by mouth 3 (three) times daily. 04/06/17  Yes [name], MD  gabapentin (NEURONTIN) 400 MG capsule Take 400 mg by mouth at bedtime.   Yes [provider]  LACTOBACILLUS PO Take 1 capsule by mouth daily.   Yes [provider]  loratadine (CLARITIN) 10 MG tablet Take 10 mg by mouth daily.   Yes [provider]  neomycin-bacitracin-polymyxin (NEOSPORIN) [phone] ointment Apply 1 application topically daily.   Yes [provider]  nitroGLYCERIN (NITRODUR - DOSED IN MG/24 HR) 0.2 mg/hr patch Place 1 patch onto the skin daily.  09/18/13  Yes [provider]  ondansetron (ZOFRAN) 4 MG tablet Take 1 tablet (4 mg total) by mouth every 8 (eight) hours as  needed for nausea or vomiting. 11/28/15  Yes Daymon Larsen, MD  polyethylene glycol [facility] / Floria Raveling) packet Take 17 g by mouth daily.   Yes [provider]  potassium chloride SA (K-DUR,KLOR-CON) 10 MEQ tablet Take 1 tablet (10 mEq total) by mouth daily. 11/09/16  Yes [name], MD  ranitidine (ZANTAC) 150 MG capsule Take 150 mg by mouth 2 (two) times daily.   Yes [provider]  sennosides-docusate sodium (SENOKOT-S) 8.6-50 MG tablet Take 2 tablets by mouth daily.   Yes [provider]  tiotropium (SPIRIVA) 18 MCG inhalation capsule Place 1 capsule (18 mcg total) into inhaler and inhale daily. 04/06/17  Yes [name], MD  feeding supplement (BOOST HIGH PROTEIN) LIQD Take 1 Container by mouth daily.    [provider]  Spacer/Aero-Hold Chamber Mask MISC 1 Units by Does not apply route every 6 (six) hours as needed. 02/15/17  Darel Hong, MD  traMADol (ULTRAM) 50 MG tablet Take 1 tablet (50 mg total) by mouth every 12 (twelve) hours as needed for moderate pain or severe pain. 04/06/17   [name], MD      PHYSICAL EXAMINATION:   VITAL SIGNS: Blood pressure (!) 158/76, pulse 88, temperature 98.4 F (36.9 C), temperature source Oral, resp. rate 16, height 5\' 6"  (1.676 m), weight 62.1 kg (137 lb), SpO2 100 %.  GENERAL:  83 y.o.-year-old Weak and chronically sick patient lying in the bed with no acute distress.  EYES: Pupils equal, round, reactive to light and accommodation. No scleral icterus. Extraocular muscles intact.  HEENT: Head atraumatic, normocephalic. Oropharynx and nasopharynx clear.  NECK:  Supple, no jugular venous distention. No thyroid enlargement, no tenderness.  LUNGS: Normal breath sounds bilaterally, no wheezing,some  crepitation. No use of accessory muscles of respiration. Supplemental oxygen via nasal cannula.  CARDIOVASCULAR: S1, S2 normal. No murmurs, rubs, or gallops.  ABDOMEN: Soft, nontender,  nondistended. Bowel sounds present. No organomegaly or mass.  EXTREMITIES: No pedal edema, cyanosis, or clubbing. Left dorsum of the foot have ulcer and dressing present.  NEUROLOGIC: Cranial nerves II through XII are intact. Muscle strength 3-4/5 in all extremities. Sensation intact. Gait not checked.  PSYCHIATRIC: The patient is alert and oriented x 3.  SKIN: No obvious rash, lesion, or ulcer.   LABORATORY PANEL:   CBC  Recent Labs Lab 04/24/17 1851  WBC 18.5*  HGB 9.4*  HCT 27.5*  PLT 241  MCV 98.4  MCH 33.6  MCHC 34.2  RDW 14.2  LYMPHSABS 0.9*  MONOABS 1.1*  EOSABS 0.1  BASOSABS 0.1   ------------------------------------------------------------------------------------------------------------------  Chemistries   Recent Labs Lab 04/24/17 1851  NA 132*  K 4.8  CL 93*  CO2 29  GLUCOSE 143*  BUN 55*  CREATININE 1.85*  CALCIUM 9.6  AST 29  ALT 10*  ALKPHOS 130*  BILITOT 1.1   ------------------------------------------------------------------------------------------------------------------ estimated creatinine clearance is 18.2 mL/min (A) (by C-G formula based on SCr of 1.85 mg/dL (H)). ------------------------------------------------------------------------------------------------------------------ No results for input(s): TSH, T4TOTAL, T3FREE, THYROIDAB in the last 72 hours.  Invalid input(s): FREET3   Coagulation profile No results for input(s): INR, PROTIME in the last 168 hours. ------------------------------------------------------------------------------------------------------------------- No results for input(s): DDIMER in the last 72 hours. -------------------------------------------------------------------------------------------------------------------  Cardiac Enzymes  Recent Labs Lab 04/24/17 1851  TROPONINI <0.03   ------------------------------------------------------------------------------------------------------------------ Invalid  input(s): POCBNP  ---------------------------------------------------------------------------------------------------------------  Urinalysis    Component Value Date/Time   COLORURINE STRAW (A) 04/24/2017 1917   APPEARANCEUR CLEAR (A) 04/24/2017 1917   APPEARANCEUR Clear 02/24/2013 0025   LABSPEC 1.008 04/24/2017 1917   LABSPEC 1.009 02/24/2013 0025   PHURINE 6.0 04/24/2017 1917   GLUCOSEU NEGATIVE 04/24/2017 1917   GLUCOSEU Negative 02/24/2013 0025   HGBUR NEGATIVE 04/24/2017 1917   BILIRUBINUR NEGATIVE 04/24/2017 1917   BILIRUBINUR Negative 02/24/2013 0025   KETONESUR NEGATIVE 04/24/2017 1917   PROTEINUR NEGATIVE 04/24/2017 1917   NITRITE NEGATIVE 04/24/2017 1917   LEUKOCYTESUR NEGATIVE 04/24/2017 1917   LEUKOCYTESUR 3+ 02/24/2013 0025     RADIOLOGY: Dg Chest 2 View  Result Date: 04/24/2017 CLINICAL DATA:  Cough and weakness EXAM: CHEST  2 VIEW COMPARISON:  April 05, 2017 FINDINGS: There is a small right pleural effusion. There is a new subtle area of opacity in the right lower lobe, concerning for pneumonia. There is slight atelectasis in the left base. The lungs elsewhere clear. Heart is mildly enlarged with pulmonary vascularity within normal limits. No adenopathy. There  is aortic atherosclerosis. No adenopathy. There is a fracture of the mid left humerus with posterior angulation distally. IMPRESSION: 1.  Patchy opacity right lower lobe, likely early pneumonia. 2.  Stable cardiomegaly.  Small right pleural effusion persists. 3.  Fracture mid left humerus with posterior angulation distally. 4.  Aortic atherosclerosis. Aortic Atherosclerosis (ICD10-I70.0). Electronically Signed   By: Lowella Grip III M.D.   On: 04/24/2017 19:47   Dg Foot Complete Left  Result Date: 04/24/2017 CLINICAL DATA:  Weakness and cough.  Left foot ulcer on top of foot. EXAM: LEFT FOOT - COMPLETE 3+ VIEW COMPARISON:  None. FINDINGS: Diffuse osteopenia limits evaluation. Within this limitation, no  fractures are seen. No bony erosions are identified. IMPRESSION: No fracture.  No bony erosion to suggest osteomyelitis. Electronically Signed   By: Dorise Bullion III M.D   On: 04/24/2017 19:47    EKG: Orders placed or performed during the hospital encounter of 04/24/17  . EKG 12-Lead  . EKG 12-Lead    IMPRESSION AND PLAN:  * Acute on chronic respiratory failure secondary to healthcare associated pneumonia   IV vancomycin and cefepime.  swallow evaluation.  * COPD, no acute exacerbation.   Continue inhalers and nebulizer treatment.  * Hypertension   Cont home meds.  * Hx of CAD   Cont ASA, Plavix  * Decreased appetite   As presented with the pneumonia, will also get speech and swallow evaluation.  Prognosis is very poor because of gradually worsening overall condition and decreased appetite now.  All the records are reviewed and case discussed with ED provider. Management plans discussed with the patient, family and they are in agreement.  CODE STATUS: Code Status History    Date Active Date Inactive Code Status Order ID Comments User Context   04/01/2017  7:30 PM 04/06/2017 11:08 PM Full Code [id number]  Theodoro Grist, MD Inpatient   02/18/2017 10:48 AM 02/21/2017  5:05 PM DNR [id number]  Loletha Grayer, MD ED   11/16/2016  6:31 PM 11/18/2016  7:41 PM DNR [id number]  Awilda Bill, NP ED   11/07/2016  2:54 PM 11/09/2016  9:28 PM DNR [id number]  Idelle Crouch, MD Inpatient   11/07/2016  2:13 PM 11/07/2016  2:54 PM Full Code [id number]  Idelle Crouch, MD Inpatient    Advance Directive Documentation     Most Recent Value  Type of Advance Directive  Living will, Healthcare Power of Attorney  Pre-existing out of facility DNR order (yellow form or pink MOST form)  -  "MOST" Form in Place?  -     Patient's daughter was present in the room.  TOTAL TIME TAKING CARE OF THIS PATIENT: 50  minutes.    [name] M.D on 04/24/2017   Between 7am to 6pm - Pager -  [phone]  After 6pm go to [email or web address] - password [email or web address] [place] [facility]  Office  [phone]  CC: Primary care physician; Raelyn Number, MD   Note: This dictation was prepared with Dragon dictation along with smaller phrase technology. Any transcriptional errors that result from this process are unintentional.

## 2017-04-25 LAB — BASIC METABOLIC PANEL
Anion gap: 8 (ref 5–15)
BUN: 55 mg/dL — ABNORMAL HIGH (ref 6–20)
CHLORIDE: 96 mmol/L — AB (ref 101–111)
CO2: 31 mmol/L (ref 22–32)
Calcium: 9.3 mg/dL (ref 8.9–10.3)
Creatinine, Ser: 1.95 mg/dL — ABNORMAL HIGH (ref 0.44–1.00)
GFR, EST AFRICAN AMERICAN: 25 mL/min — AB (ref >60)
GFR, EST NON AFRICAN AMERICAN: 21 mL/min — AB (ref >60)
Glucose, Bld: 121 mg/dL — ABNORMAL HIGH (ref 65–99)
POTASSIUM: 4 mmol/L (ref 3.5–5.1)
SODIUM: 135 mmol/L (ref 135–145)

## 2017-04-25 LAB — CBC
HEMATOCRIT: 25.5 % — AB (ref 35.0–47.0)
Hemoglobin: 8.5 g/dL — ABNORMAL LOW (ref 12.0–16.0)
MCH: 32.7 pg (ref 26.0–34.0)
MCHC: 33.3 g/dL (ref 32.0–36.0)
MCV: 98.1 fL (ref 80.0–100.0)
PLATELETS: 153 10*3/uL (ref 150–440)
RBC: 2.6 MIL/uL — AB (ref 3.80–5.20)
RDW: 13.9 % (ref 11.5–14.5)
WBC: 12.8 10*3/uL — AB (ref 3.6–11.0)

## 2017-04-25 MED ORDER — CHLORHEXIDINE GLUCONATE CLOTH 2 % EX PADS
6.0000 | MEDICATED_PAD | Freq: Every day | CUTANEOUS | Status: DC
  Administered 2017-04-25 – 2017-04-26 (×2): 6 via TOPICAL

## 2017-04-25 MED ORDER — FAMOTIDINE 20 MG PO TABS
20.0000 mg | ORAL_TABLET | Freq: Every day | ORAL | Status: DC
  Administered 2017-04-25 – 2017-04-27 (×3): 20 mg via ORAL
  Filled 2017-04-25 (×3): qty 1

## 2017-04-25 MED ORDER — IPRATROPIUM-ALBUTEROL 0.5-2.5 (3) MG/3ML IN SOLN: 3.0000 mL | RESPIRATORY_TRACT | Status: DC | PRN

## 2017-04-25 MED ORDER — BISACODYL 5 MG PO TBEC: 5.0000 mg | DELAYED_RELEASE_TABLET | Freq: Every day | ORAL | Status: DC | PRN

## 2017-04-25 MED ORDER — MUPIROCIN 2 % EX OINT
1.0000 "application " | TOPICAL_OINTMENT | Freq: Two times a day (BID) | CUTANEOUS | Status: DC
  Administered 2017-04-25 (×3): 1 via NASAL
  Filled 2017-04-25: qty 22

## 2017-04-25 MED ORDER — IPRATROPIUM-ALBUTEROL 0.5-2.5 (3) MG/3ML IN SOLN
3.0000 mL | Freq: Four times a day (QID) | RESPIRATORY_TRACT | Status: DC
  Administered 2017-04-25 (×3): 3 mL via RESPIRATORY_TRACT
  Filled 2017-04-25 (×3): qty 3

## 2017-04-25 NOTE — Progress Notes (Signed)
Patient admitted for acute respiratory distress and increasingly somnolent per family. Patient has AKI on CKD w/ a CrCl 18.2 ml/min.  She takes gabapentin 200 mg tid + gabapentin 400 mg at bedtime for a total dose of 1200mg .  Spoke to MD to hold night time dose until patient can stabilize. MD notified and agrees with plan.  Tobie Lords, PharmD, BCPS Clinical Pharmacist 04/25/2017

## 2017-04-25 NOTE — Progress Notes (Signed)
[facility] [place] NAME: [name] [name]    MR#:  [id number]  DATE OF BIRTH:  07-21-1923  SUBJECTIVE:  CHIEF COMPLAINT:   Chief Complaint  Patient presents with  . Weakness   Cough, phlegm and shortness breath, on O2 C[place] 2L. REVIEW OF SYSTEMS:  Review of Systems  Constitutional: Positive for malaise/fatigue. Negative for chills and fever.  HENT: Negative for sore throat.   Eyes: Negative for blurred vision and double vision.  Respiratory: Positive for cough, sputum production and shortness of breath. Negative for hemoptysis, wheezing and stridor.   Cardiovascular: Negative for chest pain, palpitations and leg swelling.  Gastrointestinal: Positive for constipation. Negative for abdominal pain, blood in stool, diarrhea, melena, nausea and vomiting.  Genitourinary: Negative for dysuria, flank pain and urgency.  Musculoskeletal: Positive for joint pain.  Skin: Negative for itching and rash.  Neurological: Positive for weakness. Negative for dizziness, focal weakness, loss of consciousness and headaches.  Psychiatric/Behavioral: Negative for depression. The patient is not nervous/anxious.     DRUG ALLERGIES:   Allergies  Allergen Reactions  . Cholesterol Other (See Comments)    Patient states she is allergic to a cholesterol medication, which causes flu-like symptoms. Name is unknown.  . Morphine And Related     Heart problems   . Zoloft [Sertraline Hcl] Swelling   VITALS:  Blood pressure (!) 157/64, pulse 86, temperature 98 F (36.7 C), temperature source Oral, resp. rate 19, height 5\' 6"  (1.676 m), weight 136 lb 14.4 oz (62.1 kg), SpO2 97 %. PHYSICAL EXAMINATION:  Physical Exam  Constitutional: She is oriented to person, place, and time and well-developed, well-nourished, and in no distress.  HENT:  Head: Normocephalic.  Mouth/Throat: Oropharynx is clear and moist.  Eyes: Conjunctivae and EOM are normal. No scleral icterus.    Neck: Normal range of motion. Neck supple. No JVD present. No tracheal deviation present.  Cardiovascular: Normal rate, regular rhythm and normal heart sounds.  Exam reveals no gallop.   No murmur heard. Pulmonary/Chest: Effort normal. No respiratory distress. She has no wheezes.  Bilateral crackles and coarse breath sounds.  Abdominal: Bowel sounds are normal. She exhibits no distension. There is no tenderness. There is no rebound.  Musculoskeletal: Normal range of motion. She exhibits no edema or tenderness.  Neurological: She is alert and oriented to person, place, and time. No cranial nerve deficit.  Skin: No rash noted. No erythema.  Psychiatric: Affect normal.   LABORATORY PANEL:  Female CBC  Recent Labs Lab 04/25/17 0356  WBC 12.8*  HGB 8.5*  HCT 25.5*  PLT 153   ------------------------------------------------------------------------------------------------------------------ Chemistries   Recent Labs Lab 04/24/17 1851 04/25/17 0356  NA 132* 135  K 4.8 4.0  CL 93* 96*  CO2 29 31  GLUCOSE 143* 121*  BUN 55* 55*  CREATININE 1.85* 1.95*  CALCIUM 9.6 9.3  AST 29  --   ALT 10*  --   ALKPHOS 130*  --   BILITOT 1.1  --    RADIOLOGY:  Dg Chest 2 View  Result Date: 04/24/2017 CLINICAL DATA:  Cough and weakness EXAM: CHEST  2 VIEW COMPARISON:  April 05, 2017 FINDINGS: There is a small right pleural effusion. There is a new subtle area of opacity in the right lower lobe, concerning for pneumonia. There is slight atelectasis in the left base. The lungs elsewhere clear. Heart is mildly enlarged with pulmonary vascularity within normal limits. No adenopathy. There is aortic atherosclerosis. No  adenopathy. There is a fracture of the mid left humerus with posterior angulation distally. IMPRESSION: 1.  Patchy opacity right lower lobe, likely early pneumonia. 2.  Stable cardiomegaly.  Small right pleural effusion persists. 3.  Fracture mid left humerus with posterior angulation  distally. 4.  Aortic atherosclerosis. Aortic Atherosclerosis (ICD10-I70.0). Electronically Signed   By: Lowella Grip III M.D.   On: 04/24/2017 19:47   Dg Foot Complete Left  Result Date: 04/24/2017 CLINICAL DATA:  Weakness and cough.  Left foot ulcer on top of foot. EXAM: LEFT FOOT - COMPLETE 3+ VIEW COMPARISON:  None. FINDINGS: Diffuse osteopenia limits evaluation. Within this limitation, no fractures are seen. No bony erosions are identified. IMPRESSION: No fracture.  No bony erosion to suggest osteomyelitis. Electronically Signed   By: Dorise Bullion III M.D   On: 04/24/2017 19:47   ASSESSMENT AND PLAN:   * Acute on chronic respiratory failure With hypoxia secondary to healthcare associated pneumonia with leukocytosis.  Continue  IV vancomycin and cefepime.  * COPD, no acute exacerbation.   Continue inhalers and nebulizer treatment.  * Hypertension   Cont home meds.  * Hx of CAD   Cont ASA, Plavix  CKD stage 4, stable.  * Decreased appetite She passed nurse soaring schooling, start soft diet.  Fracture mid left humerus. Orthopedic consult. Pain control.  Prognosis is very poor because of gradually worsening overall condition. All the records are reviewed and case discussed with Care Management/Social Worker. Management plans discussed with the patient, family and they are in agreement.  CODE STATUS: DNR  TOTAL TIME TAKING CARE OF THIS PATIENT: 38 minutes.   More than 50% of the time was spent in counseling/coordination of care: YES  POSSIBLE D/C IN 2 DAYS, DEPENDING ON CLINICAL CONDITION.   Demetrios Loll M.D on 04/25/2017 at 12:42 PM  Between 7am to 6pm - Pager - [phone]  After 6pm go to [email or web address] - Proofreader  [facility]  Office  [phone]  CC: Primary care physician; Raelyn Number, MD  Note: This dictation was prepared with Dragon dictation along with smaller phrase technology. Any transcriptional errors  that result from this process are unintentional.

## 2017-04-25 NOTE — Progress Notes (Signed)
Physical Therapy Evaluation Patient Details Name: [name] [name] MRN: [id number] DOB: 11-22-1922 Today's Date: 04/25/2017   History of Present Illness  Patient is a 83 y.o. female admitted on 07 JUL for acute on chronic RF with hypoxia, secondary to pneumonia with notable decreased appetite/increased fatigue. Patient has been at [facility] since fx L humerus in NOV '17. PMH includes CHF, CAD, COPD, HLD, HTN, renal disorder, and CKDIII. Patient on 2L home O2.  Clinical Impression  Patient is a pleasant female admitted for above listed reasons. Patient reportedly admitted from [facility]. Per patient, she has been using w/c for most mobility since humeral fracture in NOV '17; however, previous note states APR '18. Patient demonstrates ability to perform bed mobility, transfers, and short gait distances with minimal assistance. Patient will continue to benefit from skilled and progressive PT to further improve functional strength and stamina with f/u at SNF upon d/c when medically ready.    Follow Up Recommendations SNF    Equipment Recommendations  None recommended by PT    Recommendations for Other Services       Precautions / Restrictions Precautions Precautions: Fall Restrictions Weight Bearing Restrictions: No LUE Weight Bearing: Weight bearing as tolerated      Mobility  Bed Mobility Overal bed mobility: Needs Assistance Bed Mobility: Supine to Sit     Supine to sit: Min assist;HOB elevated     General bed mobility comments: Patient performs bed mobility with minimal assistance to maintain balance due to decreased use of L UE.  Transfers Overall transfer level: Needs assistance Equipment used: Rolling walker (2 wheeled) Transfers: Sit to/from Stand Sit to Stand: Min assist         General transfer comment: Patient performs sit to stand transfer with minimal assistance.  Ambulation/Gait Ambulation/Gait assistance: Min guard Ambulation Distance (Feet): 3 Feet Assistive device:  Rolling walker (2 wheeled)       General Gait Details: Patient ambulates at decreased cadence, demonstrating ability to turn walker and weightshift to sit in bedside chair.  Stairs            Wheelchair Mobility    Modified Rankin (Stroke Patients Only)       Balance Overall balance assessment: Needs assistance;History of Falls Sitting-balance support: Feet supported Sitting balance-Leahy Scale: Good     Standing balance support: Bilateral upper extremity supported Standing balance-Leahy Scale: Fair                               Pertinent Vitals/Pain Pain Assessment: No/denies pain Pain Score: 0-No pain Pain Intervention(s): Limited activity within patient's tolerance;Monitored during session    Home Living Family/patient expects to be discharged to:: [place]: Gilford Rile - 2 wheels;Wheelchair - manual      Prior Function Level of Independence: Needs assistance   Gait / Transfers Assistance Needed: Patient has been using feet to move wheelchair  ADL's / Homemaking Assistance Needed: Performed by SNF        Hand Dominance   Dominant Hand: Right    Extremity/Trunk Assessment   Upper Extremity Assessment Upper Extremity Assessment: LUE deficits/detail;Generalized weakness;Overall [facility] for tasks assessed LUE Deficits / Details: Patient's L UE immobilized due to humeral fx. Previous note reports April 2018 was when fall occurred; however, patient reported NOV '17. LUE: Unable to fully assess due to pain;Unable to fully assess due to  immobilization    Lower Extremity Assessment Lower Extremity Assessment: Generalized weakness;RLE deficits/detail;LLE deficits/detail RLE Deficits / Details: Ulcers RLE: Unable to fully assess due to pain LLE Deficits / Details: Ulcers LLE: Unable to fully assess due to pain       Communication   Communication: No difficulties  Cognition Arousal/Alertness:  Awake/alert Behavior During Therapy: WFL for tasks assessed/performed Overall Cognitive Status: Within Functional Limits for tasks assessed                                        General Comments      Exercises     Assessment/Plan    PT Assessment Patient needs continued PT services  PT Problem List Decreased strength;Decreased range of motion;Decreased activity tolerance;Decreased balance;Decreased mobility;Decreased knowledge of use of DME;Decreased safety awareness;Cardiopulmonary status limiting activity;Decreased skin integrity       PT Treatment Interventions DME instruction;Gait training;Functional mobility training;Therapeutic activities;Therapeutic exercise;Balance training;Patient/family education;Wheelchair mobility training    PT Goals (Current goals can be found in the Care Plan section)  Acute Rehab PT Goals Patient Stated Goal: "To get stronger" PT Goal Formulation: With patient Time For Goal Achievement: 05/09/17 Potential to Achieve Goals: Good    Frequency Min 2X/week   Barriers to discharge Inaccessible home environment;Decreased caregiver support      Co-evaluation               AM-PAC PT "6 Clicks" Daily Activity  Outcome Measure Difficulty turning over in bed (including adjusting bedclothes, sheets and blankets)?: A Little Difficulty moving from lying on back to sitting on the side of the bed? : A Little Difficulty sitting down on and standing up from a chair with arms (e.g., wheelchair, bedside commode, etc,.)?: A Little Help needed moving to and from a bed to chair (including a wheelchair)?: A Lot Help needed walking in hospital room?: Total Help needed climbing 3-5 steps with a railing? : Total 6 Click Score: 13    End of Session Equipment Utilized During Treatment: Gait belt;Oxygen Activity Tolerance: Patient tolerated treatment well Patient left: in chair;with call bell/phone within reach;with chair alarm set   PT  Visit Diagnosis: Unsteadiness on feet (R26.81);Muscle weakness (generalized) (M62.81);History of falling (Z91.81);Difficulty in walking, not elsewhere classified (R26.2);Pain    Time: 5597-4163 PT Time Calculation (min) (ACUTE ONLY): 19 min   Charges:   PT Evaluation $PT Eval Low Complexity: 1 Procedure     PT G Codes:          Dorice Lamas, PT, DPT 04/25/2017, 12:25 PM

## 2017-04-25 NOTE — Clinical Social Work Note (Signed)
Clinical Social Work Assessment  Patient Details  Name: [name] [name] MRN: [id number] Date of Birth: Feb 21, 1923  Date of referral:  04/25/17               Reason for consult:  Facility Placement                Permission sought to share information with:  Facility Art therapist granted to share information::  Yes, Verbal Permission Granted  Name::        Agency::     Relationship::     Contact Information:     Housing/Transportation Living arrangements for the past 2 months:  [place] of Information:  Adult Children, Facility Patient Interpreter Needed:  None Criminal Activity/Legal Involvement Pertinent to Current Situation/Hospitalization:  No - Comment as needed Significant Relationships:  Adult Children Lives with:  Self Do you feel safe going back to the place where you live?  Yes Need for family participation in patient care:  Yes (Comment)  Care giving concerns:  Patient admitted from [facility]/SNF   Social Worker assessment / plan:  CSW attempted to meet with family at bedside to discuss discharge planning. The patient was sleeping soundly, and no family was present. The CSW contacted the patient's daughter, [name] [name], who confirmed that the patient will return to [facility] to finish STR when stable. The CSW contacted Broadus John, the admissions coordinator, who confirmed that the patient could return when stable. The CSW will continue to follow for discharge facilitation when the patient is medically cleared for return to [facility].  Employment status:  Retired Forensic scientist:  Medicare PT Recommendations:  [place] / Referral to community resources:     Patient/Family's Response to care:  The family thanked the CSW for information.  Patient/Family's Understanding of and Emotional Response to Diagnosis, Current Treatment, and Prognosis:  The family understands that the patient will benefit from continued STR and are  in agreement.  Emotional Assessment Appearance:  Appears stated age Attitude/Demeanor/Rapport:   (Patient was sleeping) Affect (typically observed):   (Patient was sleeping) Orientation:   (Patient was sleeping) Alcohol / Substance use:  Never Used Psych involvement (Current and /or in the community):  No (Comment)  Discharge Needs  Concerns to be addressed:  Care Coordination Readmission within the last 30 days:  Yes Current discharge risk:  Chronically ill Barriers to Discharge:  Continued Medical Work up   [employer], LCSW 04/25/2017, 3:00 PM

## 2017-04-25 NOTE — NC FL2 (Signed)
[place] LEVEL OF CARE SCREENING TOOL     IDENTIFICATION  Patient Name: [name] [name] Birthdate: Feb 01, 1923 Sex: female Admission Date (Current Location): 04/24/2017  [place] and [place] Number:  Engineering geologist and Address:  [facility], [address] [place], [place] [address]      Provider Number: [id number]  Attending Physician Name and Address:  Demetrios Loll, MD  Relative Name and Phone Number:       Current Level of Care: Hospital Recommended Level of Care: [place] Prior Approval Number:    Date Approved/Denied:   PASRR Number: [id number]  Discharge Plan: SNF    Current Diagnoses: Patient Active Problem List   Diagnosis Date Noted  . HCAP (healthcare-associated pneumonia) 04/24/2017  . Acute on chronic respiratory failure ([place]) 04/24/2017  . Acute on chronic diastolic CHF (congestive heart failure) ([place]) 04/01/2017  . Acute on chronic respiratory failure with hypoxia ([place]) 04/01/2017  . Malignant hypertension 04/01/2017  . Hyponatremia 04/01/2017  . Hyperkalemia 04/01/2017  . COPD exacerbation ([place]) 02/18/2017  . Pressure injury of skin 02/18/2017  . Chronic diastolic heart failure ([place]) 12/15/2016  . HTN (hypertension) 12/15/2016  . Hypotension 11/16/2016  . CAP (community acquired pneumonia) 11/07/2016  . Generalized anxiety disorder 09/26/2016  . PAD (peripheral artery disease) ([place]) 10/05/2013  . Chronic venous insufficiency 10/05/2013    Orientation RESPIRATION BLADDER Height & Weight     Self, Time, Situation, Place  O2 (2L o2) Incontinent Weight: 136 lb 14.4 oz (62.1 kg) Height:  5\' 6"  (167.6 cm)  BEHAVIORAL SYMPTOMS/MOOD NEUROLOGICAL BOWEL NUTRITION STATUS      Continent Diet (Dysphagia 2)  AMBULATORY STATUS COMMUNICATION OF NEEDS Skin   Extensive Assist Verbally PU Stage and Appropriate Care   PU Stage 2 Dressing: Daily ( Pt has one open stage 2 area to left buttock. She also has  another area on right sacrum that is blistered, intact. Pt has other areas of blanchable redness to sacrum, buttocks. )                   Personal Care Assistance Level of Assistance  Bathing, Feeding, Dressing Bathing Assistance: Limited assistance Feeding assistance: Independent Dressing Assistance: Limited assistance     Functional Limitations Info    Sight Info: Adequate Hearing Info: Adequate Speech Info: Adequate    SPECIAL CARE FACTORS FREQUENCY  PT (By licensed PT)     PT Frequency: Up to 5X per day, 5 days per week              Contractures      Additional Factors Info  Code Status, Allergies Code Status Info: DNR Allergies Info: Cholesterol, Morphine And Related, Zoloft Sertraline Hcl           Current Medications (04/25/2017):  This is the current hospital active medication list Current Facility-Administered Medications  Medication Dose Route Frequency Provider Last Rate Last Dose  . acetaminophen (TYLENOL) tablet 650 mg  650 mg Oral Q6H PRN [name], MD      . ALPRAZolam Duanne Moron) tablet 0.125 mg  0.125 mg Oral BID PRN [name], MD      . aspirin EC tablet 81 mg  81 mg Oral Daily [name], MD   81 mg at 04/25/17 1114  . bisacodyl (DULCOLAX) EC tablet 5 mg  5 mg Oral Daily PRN Demetrios Loll, MD      . carvedilol (COREG) tablet 25 mg  25 mg Oral BID [name],  MD   25 mg at 04/25/17 1114  . ceFEPIme (MAXIPIME) 1 g in dextrose 5 % 50 mL IVPB  1 g Intravenous Q24H Harvest Dark, MD      . Chlorhexidine Gluconate Cloth 2 % PADS 6 each  6 each Topical Q0600 [name], MD   6 each at 04/25/17 0548  . clopidogrel (PLAVIX) tablet 75 mg  75 mg Oral Daily [name], MD   75 mg at 04/25/17 1114  . docusate sodium (COLACE) capsule 100 mg  100 mg Oral BID PRN [name], MD      . famotidine (PEPCID) tablet 20 mg  20 mg Oral Daily [name], MD   20 mg at 04/25/17  1114  . feeding supplement (ENSURE ENLIVE) (ENSURE ENLIVE) liquid 237 mL  237 mL Oral Daily [name], MD      . fluticasone (FLONASE) 50 MCG/ACT nasal spray 2 spray  2 spray Each Nare Daily [name], MD   2 spray at 04/25/17 1115  . furosemide (LASIX) tablet 20 mg  20 mg Oral Daily [name], MD   20 mg at 04/25/17 1114  . gabapentin (NEURONTIN) capsule 200 mg  200 mg Oral TID [name], MD   200 mg at 04/25/17 1114  . heparin injection 5,000 Units  5,000 Units Subcutaneous Q8H [name], MD   5,000 Units at 04/25/17 0546  . ipratropium-albuterol (DUONEB) 0.5-2.5 (3) MG/3ML nebulizer solution 3 mL  3 mL Nebulization Q6H [name], MD   3 mL at 04/25/17 1401  . ipratropium-albuterol (DUONEB) 0.5-2.5 (3) MG/3ML nebulizer solution 3 mL  3 mL Nebulization Q4H PRN [name], MD      . lactobacillus acidophilus (BACID) tablet 2 tablet  2 tablet Oral TID [name], MD   2 tablet at 04/25/17 1114  . mupirocin ointment (BACTROBAN) 2 % 1 application  1 application Nasal BID [name], MD   1 application at 63/01/60 1115  . ondansetron (ZOFRAN) tablet 4 mg  4 mg Oral Q8H PRN [name], MD      . polyethylene glycol (MIRALAX / GLYCOLAX) packet 17 g  17 g Oral Daily [name], MD   17 g at 04/25/17 1118  . senna-docusate (Senokot-S) tablet 2 tablet  2 tablet Oral Daily [name], MD   2 tablet at 04/25/17 1114  . tiotropium (SPIRIVA) inhalation capsule 18 mcg  18 mcg Inhalation Daily [name], MD   18 mcg at 04/25/17 1115  . traMADol (ULTRAM) tablet 50 mg  50 mg Oral Q12H PRN [name], MD   50 mg at 04/24/17 2150  . TRIPLE ANTIBIOTIC [id number] OINT 1 application  1 application Topical Daily [name], MD   1 application at 32/20/25 1118  . [START ON 04/26/2017] vancomycin (VANCOCIN) IVPB 1000 mg/200 mL premix  1,000  mg Intravenous Q48H Paduchowski, Lennette Bihari, MD      . vitamin B-12 (CYANOCOBALAMIN) tablet 1,000 mcg  1,000 mcg Oral Daily [name], MD   1,000 mcg at 04/25/17 1114     Discharge Medications: Please see discharge summary for a list of discharge medications.  Relevant Imaging Results:  Relevant Lab Results:   Additional Information SS# [id number]  Zettie Pho, LCSW

## 2017-04-25 NOTE — Clinical Social Work Note (Signed)
CSW received consult that patient is from [facility]. CSW will assess when able.  Santiago Bumpers, MSW, Latanya Presser [phone]

## 2017-04-25 NOTE — Progress Notes (Signed)
Pt admitted to unit.  Pt high fall risk with prior falls and broken left arm.  Did not put patient in low bed as she is not impulsive, a+ox4, uses call light, and does not move without assistance. Pt very reluctant to move so will keep in regular bed with bed alarm on at this time.  Pt has prior hx of wounds to feet labeled as diabetic ulcers.  Pt does not have diabetes.  Both the left forefoot and the right 2nd toe are from traumatic injuries.  Both were dressed in the ER and did not remove dressings at this time. Pt has known hx of pressure injury to buttocks and sacrum.  Pt has one open stage 2 area to left bottock which was cleaned and covered with foam dressing.  She also has another area on right sacrum that is blistered, intact.  Cleansed and covered with large foam dressing.  Pt has other areas of blanchable redness to sacrum, buttocks.  Turn q 2, offload. Use of pure wick external female catheter to manage incontinence.  Dorna Bloom RN

## 2017-04-26 LAB — URINE CULTURE: CULTURE: NO GROWTH

## 2017-04-26 LAB — CREATININE, SERUM
Creatinine, Ser: 1.81 mg/dL — ABNORMAL HIGH (ref 0.44–1.00)
GFR, EST AFRICAN AMERICAN: 27 mL/min — AB (ref >60)
GFR, EST NON AFRICAN AMERICAN: 23 mL/min — AB (ref >60)

## 2017-04-26 MED ORDER — CYCLOBENZAPRINE HCL 10 MG PO TABS
5.0000 mg | ORAL_TABLET | Freq: Three times a day (TID) | ORAL | Status: DC | PRN
  Administered 2017-04-26 – 2017-04-27 (×2): 5 mg via ORAL
  Filled 2017-04-26 (×2): qty 1

## 2017-04-26 MED ORDER — ADULT MULTIVITAMIN W/MINERALS CH
1.0000 | ORAL_TABLET | Freq: Every day | ORAL | Status: DC
  Administered 2017-04-26 – 2017-04-27 (×2): 1 via ORAL
  Filled 2017-04-26 (×2): qty 1

## 2017-04-26 MED ORDER — IPRATROPIUM-ALBUTEROL 0.5-2.5 (3) MG/3ML IN SOLN
3.0000 mL | Freq: Three times a day (TID) | RESPIRATORY_TRACT | Status: DC
Start: 2017-04-26 — End: 2017-04-27
  Administered 2017-04-26 – 2017-04-27 (×5): 3 mL via RESPIRATORY_TRACT
  Filled 2017-04-26 (×5): qty 3

## 2017-04-26 NOTE — Evaluation (Signed)
Clinical/Bedside Swallow Evaluation Patient Details  Name: [name] [name] MRN: [id number] Date of Birth: 1923-05-09  Today's Date: 04/26/2017 Time: SLP Start Time (ACUTE ONLY): 0820 SLP Stop Time (ACUTE ONLY): 0920 SLP Time Calculation (min) (ACUTE ONLY): 60 min  Past Medical History:  Past Medical History:  Diagnosis Date  . Anemia   . CHF (congestive heart failure) ([place])   . Coronary artery disease   . Emphysema lung ([place])   . Emphysema of lung ([place])   . GERD (gastroesophageal reflux disease)   . Hyperlipidemia   . Hypertension   . Neuropathy   . Osteoporosis   . Peripheral vascular disease ([place])   . Renal disorder   . Stroke [facility])    Past Surgical History:  Past Surgical History:  Procedure Laterality Date  . ABDOMINAL HYSTERECTOMY  1982  . CATARACT EXTRACTION    . CHOLECYSTECTOMY  1992  . JOINT REPLACEMENT  2005   HPI:  Pt is a 83 y.o. female with a known history of CHF, GERD, coronary artery disease, emphysema, COPD, hyperlipidemia, hypertension, renal disorder, stroke -was admitted in hospital 3 weeks ago and sent to rehabilitation after treating for COPD. At her baseline she is on oxygen at home. At rehabilitation she still requiring a lot of support and therapy for her day to day activities. For last few days she has decreased oral appetite and staying mostly sleepy. Also complaining of headache and her ulcers on her leg is getting worse. At admission, pt was positive for pneumonia on CXR and elevated wbc.   Assessment / Plan / Recommendation Clinical Impression  Pt appears to present w/ adequately oropharyngeal phase swallow function w/ reduced risk for aspiration when following general aspiration precautions. Pt consumed po trials of thin liquids via cup/straw and soft solids w/ no overt s/s of aspiration noted; no decline in vocal quality or respiratory status. Oral phase appeared wfl for bolus management and oral clearing given time. Pt tends to talk while eating. Pt  fed self w/ setup assist. Education given on general aspiration precautions including small bite/sips slowly and less talking during meals. Pt appears at her baseline w/ swallow function as per assessment and her description/denial of difficulty swallowing. ST services will be available for futher education while admitted if indicated. NSG updated. Of note, pt does swallow her Pills w/ Applesauce at baseline.   SLP Visit Diagnosis: Dysphagia, oropharyngeal phase (R13.12)    Aspiration Risk   (reduced)    Diet Recommendation  Dysphagia level 3(mech soft, cut meats moistened);  Thin liquids.  General aspiration precautions; Reflux precautions  Medication Administration: Whole meds with puree    Other  Recommendations Recommended Consults:  (Dietician f/u) Oral Care Recommendations: Oral care BID;Patient independent with oral care;Staff/trained caregiver to provide oral care   Follow up Recommendations None      Frequency and Duration            Prognosis Prognosis for Safe Diet Advancement: Good Barriers to Reach Goals:  (deconditioning)      Swallow Study   General Date of Onset: 04/24/17 HPI: Pt is a 83 y.o. female with a known history of CHF, GERD, coronary artery disease, emphysema, COPD, hyperlipidemia, hypertension, renal disorder, stroke -was admitted in hospital 3 weeks ago and sent to rehabilitation after treating for COPD. At her baseline she is on oxygen at home. At rehabilitation she still requiring a lot of support and therapy for her day to day activities. For last few days she has decreased  oral appetite and staying mostly sleepy. Also complaining of headache and her ulcers on her leg is getting worse. At admission, pt was positive for pneumonia on CXR and elevated wbc. Type of Study: Bedside Swallow Evaluation Previous Swallow Assessment: none indicated recently Diet Prior to this Study: Regular;Thin liquids ("I eat softer foods") Temperature Spikes Noted: No (wbc 12.8  down from admission) Respiratory Status: Nasal cannula (2 liters) History of Recent Intubation: No Behavior/Cognition: Alert;Cooperative;Pleasant mood;Distractible (min) Oral Cavity Assessment: Dry Oral Care Completed by SLP: Recent completion by staff Oral Cavity - Dentition: Dentures, top (native bottom dentition) Vision: Functional for self-feeding Self-Feeding Abilities: Able to feed self;Needs assist;Needs set up Patient Positioning: Upright in bed Baseline Vocal Quality: Normal Volitional Cough: Strong;Congested Volitional Swallow: Able to elicit    Oral/Motor/Sensory Function Overall Oral Motor/Sensory Function: Within functional limits   Ice Chips Ice chips: Within functional limits Presentation: Spoon (fed; 2 trials)   Thin Liquid Thin Liquid: Within functional limits Presentation: Cup;Self Fed;Straw (~4-5 ozs total)    Nectar Thick Nectar Thick Liquid: Not tested   Honey Thick Honey Thick Liquid: Not tested   Puree Puree: Within functional limits Presentation: Self Fed;Spoon (4 trials)   Solid   GO   Solid: Within functional limits (mech soft food consistency) Presentation: Self Fed;Spoon (8-9 trials)    Functional Assessment Tool Used: clincial judgement Functional Limitations: Swallowing Swallow Current Status (K0254): At least 1 percent but less than 20 percent impaired, limited or restricted Swallow Goal Status [phone]): At least 1 percent but less than 20 percent impaired, limited or restricted Swallow Discharge Status [phone]): At least 1 percent but less than 20 percent impaired, limited or restricted    Orinda Kenner, MS, CCC-SLP Watson,Katherine 04/26/2017,11:15 AM

## 2017-04-26 NOTE — Progress Notes (Signed)
Initial Nutrition Assessment  DOCUMENTATION CODES:   Not applicable  INTERVENTION:  Continue Ensure Enlive po once daily, each supplement provides 350 kcal and 20 grams of protein.   Provide daily multivitamin with minerals.  Encouraged adequate intake of calories and protein at meals to promote wound healing. Encouraged ongoing intake of milk, yogurt, and other foods patient enjoys that contain protein.  NUTRITION DIAGNOSIS:   Increased nutrient needs related to wound healing as evidenced by estimated needs.  GOAL:   Patient will meet greater than or equal to 90% of their needs  MONITOR:   PO intake, Supplement acceptance, Weight trends, Skin, I & O's  REASON FOR ASSESSMENT:   Consult Assessment of nutrition requirement/status  ASSESSMENT:   83 year old female with PMHx of HLD, CHF, emphysema, osteoporosis, HTN, PVD, hx of CVA, CAD, renal disorder, GERD who presented from [facility] with weakness found to have HCAP, fracture of mix left humerus.    Spoke with patient at bedside. She reports her appetite has been decreased the past week because she is having absence of hunger (anorexia). Also endorses feeling nauseas after eating. Per documented bowel movement yesterday of type 1, patient may also may be constipated. Unable to describe how she has been eating this past week, but reports it is less than normal. She usually eats all three meals at [facility]. For breakfast she enjoys eggs, milk, and sausage. Reports she does not always finish her lunch and dinner meals because of how large the portions are, but finishes some of the meat and vegetables. Patient also enjoys peach yogurt. She enjoys Ensure but only wants to drink one per day.  Patient reports she has been weight stable. Her UBW is 137 lbs. If weights in chart are accurate, patient was 151.1 lbs on 11/09/2016 and has lost approximately 14.2 lbs (9.4% body weight) over the past 6 months, which is not significant  for time frame.  Medications reviewed and include: famotidine, Lasix 20 mg daily, lactobacillus acidophilus 2 tablets TID, Miralax, vitamin B12 1000 micrograms daily, cefepime, vancomycin.  Labs reviewed: Chloride 96, BUN 55, Creatinine 1.95, EGFR 23.   Nutrition-Focused physical exam completed. Findings are mild-moderate fat depletion in upper arm region, severe muscle depletion (only in patellar region, anterior thigh region, and posterior calf region), and no edema. Patient has been bed-bound since the beginning of November 2017, so muscle wasting in lower extremities expected. She reports her wounds on her left foot and right 2nd toe developed after skin tears. Patient has thin, sparse hair that appears dry and brittle.  Patient is at risk for malnutrition, but does not meet criteria for malnutrition at this time.  Discussed with SLP.  Diet Order:  DIET DYS 3 Room service appropriate? Yes with Assist; Fluid consistency: Thin  Skin:  Wound (see comment) (Stg II buttocks & coccyx, non-pressure wounds L foot, R2 toe)  Last BM:  04/25/2017 - type 1  Height:   Ht Readings from Last 1 Encounters:  04/24/17 _0  (1.676 m)    Weight:   Wt Readings from Last 1 Encounters:  04/24/17 136 lb 14.4 oz (62.1 kg)    Ideal Body Weight:  59.1 kg  BMI:  Body mass index is 22.1 kg/m.  Estimated Nutritional Needs:   Kcal:  1550-1860 (25-30 kcal/kg)  Protein:  75-87 grams (1.2-1.4 grams/kg)  Fluid:  1.5 L/day (25 ml/kg)  EDUCATION NEEDS:   Education needs addressed  Willey Blade, MS, RD, LDN Pager: [phone] After Hours  Pager: [phone]  

## 2017-04-26 NOTE — Progress Notes (Signed)
Per [facility] liaison patient can return to [facility] when medically stable. Clinical Social Worker (CSW) will continue to follow and assist as needed.   [employer], LCSW [phone]

## 2017-04-26 NOTE — Progress Notes (Signed)
[facility] NAME: Shawnie Nicole    MR#:  [id number]  DATE OF BIRTH:  Jan 27, 1923  SUBJECTIVE:  CHIEF COMPLAINT:   Chief Complaint  Patient presents with  . Weakness   Better cough, phlegm and shortness breath, on O2 [place] 2L. REVIEW OF SYSTEMS:  Review of Systems  Constitutional: Positive for malaise/fatigue. Negative for chills and fever.  HENT: Negative for sore throat.   Eyes: Negative for blurred vision and double vision.  Respiratory: Positive for cough and shortness of breath. Negative for hemoptysis, sputum production, wheezing and stridor.   Cardiovascular: Negative for chest pain, palpitations and leg swelling.  Gastrointestinal: Negative for abdominal pain, blood in stool, constipation, diarrhea, melena, nausea and vomiting.  Genitourinary: Negative for dysuria, flank pain and urgency.  Musculoskeletal: Negative for joint pain.  Skin: Negative for itching and rash.  Neurological: Positive for weakness. Negative for dizziness, focal weakness, loss of consciousness and headaches.  Psychiatric/Behavioral: Negative for depression. The patient is not nervous/anxious.     DRUG ALLERGIES:   Allergies  Allergen Reactions  . Cholesterol Other (See Comments)    Patient states she is allergic to a cholesterol medication, which causes flu-like symptoms. Name is unknown.  . Morphine And Related     Heart problems   . Zoloft [Sertraline Hcl] Swelling   VITALS:  Blood pressure (!) 132/59, pulse 87, temperature 99.1 F (37.3 C), temperature source Oral, resp. rate 20, height 5\' 6"  (1.676 m), weight 136 lb 14.4 oz (62.1 kg), SpO2 97 %. PHYSICAL EXAMINATION:  Physical Exam  Constitutional: She is oriented to person, place, and time and well-developed, well-nourished, and in no distress.  HENT:  Head: Normocephalic.  Mouth/Throat: Oropharynx is clear and moist.  Eyes: Conjunctivae and EOM are normal. No scleral icterus.  Neck:  Normal range of motion. Neck supple. No JVD present. No tracheal deviation present.  Cardiovascular: Normal rate, regular rhythm and normal heart sounds.  Exam reveals no gallop.   No murmur heard. Pulmonary/Chest: Effort normal. No respiratory distress. She has no wheezes.  Bilateral crackles and coarse breath sounds.  Abdominal: Bowel sounds are normal. She exhibits no distension. There is no tenderness. There is no rebound.  Musculoskeletal: Normal range of motion. She exhibits no edema or tenderness.  Neurological: She is alert and oriented to person, place, and time. No cranial nerve deficit.  Skin: No rash noted. No erythema.  Psychiatric: Affect normal.   LABORATORY PANEL:  Female CBC  Recent Labs Lab 04/25/17 0356  WBC 12.8*  HGB 8.5*  HCT 25.5*  PLT 153   ------------------------------------------------------------------------------------------------------------------ Chemistries   Recent Labs Lab 04/24/17 1851 04/25/17 0356 04/26/17 0511  NA 132* 135  --   K 4.8 4.0  --   CL 93* 96*  --   CO2 29 31  --   GLUCOSE 143* 121*  --   BUN 55* 55*  --   CREATININE 1.85* 1.95* 1.81*  CALCIUM 9.6 9.3  --   AST 29  --   --   ALT 10*  --   --   ALKPHOS 130*  --   --   BILITOT 1.1  --   --    RADIOLOGY:  No results found. ASSESSMENT AND PLAN:   * Acute on chronic respiratory failure With hypoxia secondary to healthcare associated pneumonia with leukocytosis.  Continue  IV vancomycin and cefepime. F/u blood cultures. Leukocytosis is improving.  * COPD, no acute exacerbation.  Continue inhalers and nebulizer treatment.  * Hypertension   Cont home meds.  * Hx of CAD   Cont ASA, Plavix  CKD stage 4, stable.  * Decreased appetite She passed nurse soaring schooling, on soft diet.  Fracture mid left humerus. Orthopedic consult. Pain control.  Prognosis is very poor because of gradually worsening overall condition. All the records are reviewed and case  discussed with Care Management/Social Worker. Management plans discussed with the patient, family and they are in agreement.  CODE STATUS: DNR  TOTAL TIME TAKING CARE OF THIS PATIENT: 33 minutes.   More than 50% of the time was spent in counseling/coordination of care: YES  POSSIBLE D/C to SNF IN 2 DAYS, DEPENDING ON CLINICAL CONDITION.   Demetrios Loll M.D on 04/26/2017 at 12:31 PM  Between 7am to 6pm - Pager - [phone]  After 6pm go to [email or web address] - Proofreader  [facility]  Office  [phone]  CC: Primary care physician; Raelyn Number, MD  Note: This dictation was prepared with Dragon dictation along with smaller phrase technology. Any transcriptional errors that result from this process are unintentional.

## 2017-04-26 NOTE — Care Management (Signed)
Admitted to [facility] with the diagnosis of acute on chronic respiratory failure (pneumonia) from [facility]. Discharged from [facility] to [facility] 04/06/17. Prior to [facility] a [facility]. Chronic oxygen per [facility]. Spoke with Dr. Bridgett Larsson. Continue antibiotics Temperature - 99.1. Oxygen per nasal cannula in place. Possible discharge 04/27/17 Can return to [facility] when ready. Shelbie Ammons RN MSN CCM Care Management [phone]

## 2017-04-26 NOTE — Consult Note (Signed)
ORTHOPAEDIC CONSULTATION  REQUESTING PHYSICIAN: Demetrios Loll, MD  Chief Complaint: Left arm pain  HPI: [name] [name] is a 83 y.o. female who complains of  left arm pain.  She suffered a fracture of the left humerus 08/20/2016.  [facility] by Dr. Roland Rack.  If you have any concerns about this fracture.  Please contact Dr. Roland Rack.  Past Medical History:  Diagnosis Date  . Anemia   . CHF (congestive heart failure) ([place])   . Coronary artery disease   . Emphysema lung ([place])   . Emphysema of lung ([place])   . GERD (gastroesophageal reflux disease)   . Hyperlipidemia   . Hypertension   . Neuropathy   . Osteoporosis   . Peripheral vascular disease ([place])   . Renal disorder   . Stroke [facility])    Past Surgical History:  Procedure Laterality Date  . ABDOMINAL HYSTERECTOMY  1982  . CATARACT EXTRACTION    . CHOLECYSTECTOMY  1992  . JOINT REPLACEMENT  2005   Social History   Social History  . Marital status: Widowed    Spouse name: N/A  . Number of children: N/A  . Years of education: N/A   Social History Main Topics  . Smoking status: Never Smoker  . Smokeless tobacco: Never Used  . Alcohol use No  . Drug use: No  . Sexual activity: No   Other Topics Concern  . None   Social History Narrative  . None   Family History  Problem Relation Age of Onset  . Colon cancer Sister   . CVA Mother   . CAD Mother   . Hypertension Mother   . CAD Father    Allergies  Allergen Reactions  . Cholesterol Other (See Comments)    Patient states she is allergic to a cholesterol medication, which causes flu-like symptoms. Name is unknown.  . Morphine And Related     Heart problems   . Zoloft [Sertraline Hcl] Swelling   Prior to Admission medications   Medication Sig Start Date End Date Taking? Authorizing Provider  acetaminophen (TYLENOL) 325 MG tablet Take 650 mg by mouth every 6 (six) hours as needed for mild pain or moderate pain.    Yes [provider]  albuterol (PROVENTIL) (2.5 MG/3ML) 0.083% nebulizer solution Take 3 mLs (2.5 mg total) by nebulization every 6 (six) hours as needed for wheezing or shortness of breath. 04/06/17  Yes [name], MD  albuterol (PROVENTIL) (2.5 MG/3ML) 0.083% nebulizer solution Take 2.5 mg by nebulization 2 (two) times daily.   Yes [provider]  ALPRAZolam (XANAX) 0.25 MG tablet Take 0.5 tablets (0.125 mg total) by mouth 2 (two) times daily as needed for anxiety or sleep. 04/06/17  Yes [name], MD  amoxicillin-clavulanate (AUGMENTIN) 500-125 MG tablet Take 1 tablet by mouth 2 (two) times daily. 04/23/17 05/03/17 Yes [provider]  aspirin 81 MG tablet Take 81 mg by mouth daily.   Yes [provider]  carvedilol (COREG) 25 MG tablet Take 1 tablet (25 mg total) by mouth 2 (two) times daily. 02/20/17  Yes Mody, Ulice Bold, MD  celecoxib (CELEBREX) 100 MG capsule Take 100 mg by mouth every other day as needed. 10/15/16  Yes [provider]  Cholecalciferol 2000 units CAPS Take 4,000 Units by mouth daily.    Yes [provider]  clopidogrel (PLAVIX) 75 MG tablet Take 1 tablet by mouth daily. 09/18/13  Yes [provider]  cyanocobalamin  1000 MCG tablet Take 1,000 mcg by mouth daily.    Yes [provider]  dicyclomine (BENTYL) 20 MG tablet Take 1 tablet (20 mg total) by mouth 3 (three) times daily as needed for spasms. 11/28/15  Yes Daymon Larsen, MD  FLECTOR 1.3 % [facility] Place 1 patch onto the skin every 12 (twelve) hours.  09/18/13  Yes [provider]  fluticasone (FLONASE) 50 MCG/ACT nasal spray Place 2 sprays into both nostrils daily.   Yes [provider]  furosemide (LASIX) 20 MG tablet Take 20 mg by mouth daily.   Yes [provider]  gabapentin (NEURONTIN) 100 MG capsule Take 2 capsules (200 mg total) by mouth 3 (three) times daily. 04/06/17  Yes [name], MD   gabapentin (NEURONTIN) 400 MG capsule Take 400 mg by mouth at bedtime.   Yes [provider]  LACTOBACILLUS PO Take 1 capsule by mouth daily.   Yes [provider]  loratadine (CLARITIN) 10 MG tablet Take 10 mg by mouth daily.   Yes [provider]  neomycin-bacitracin-polymyxin (NEOSPORIN) [id number] ointment Apply 1 application topically daily.   Yes [provider]  nitroGLYCERIN (NITRODUR - DOSED IN MG/24 HR) 0.2 mg/hr patch Place 1 patch onto the skin daily.  09/18/13  Yes [provider]  ondansetron (ZOFRAN) 4 MG tablet Take 1 tablet (4 mg total) by mouth every 8 (eight) hours as needed for nausea or vomiting. 11/28/15  Yes Daymon Larsen, MD  polyethylene glycol [facility] / Floria Raveling) packet Take 17 g by mouth daily.   Yes [provider]  potassium chloride SA (K-DUR,KLOR-CON) 10 MEQ tablet Take 1 tablet (10 mEq total) by mouth daily. 11/09/16  Yes [name], MD  ranitidine (ZANTAC) 150 MG capsule Take 150 mg by mouth 2 (two) times daily.   Yes [provider]  sennosides-docusate sodium (SENOKOT-S) 8.6-50 MG tablet Take 2 tablets by mouth daily.   Yes [provider]  tiotropium (SPIRIVA) 18 MCG inhalation capsule Place 1 capsule (18 mcg total) into inhaler and inhale daily. 04/06/17  Yes [name], MD  feeding supplement (BOOST HIGH PROTEIN) LIQD Take 1 Container by mouth daily.    [provider]  Spacer/Aero-Hold Chamber Mask MISC 1 Units by Does not apply route every 6 (six) hours as needed. 02/15/17   Darel Hong, MD  traMADol (ULTRAM) 50 MG tablet Take 1 tablet (50 mg total) by mouth every 12 (twelve) hours as needed for moderate pain or severe pain. 04/06/17   [name], MD   Dg Chest 2 View  Result Date: 04/24/2017 CLINICAL DATA:  Cough and weakness EXAM: CHEST  2 VIEW COMPARISON:  April 05, 2017 FINDINGS: There is a small right pleural effusion. There is a new  subtle area of opacity in the right lower lobe, concerning for pneumonia. There is slight atelectasis in the left base. The lungs elsewhere clear. Heart is mildly enlarged with pulmonary vascularity within normal limits. No adenopathy. There is aortic atherosclerosis. No adenopathy. There is a fracture of the mid left humerus with posterior angulation distally. IMPRESSION: 1.  Patchy opacity right lower lobe, likely early pneumonia. 2.  Stable cardiomegaly.  Small right pleural effusion persists. 3.  Fracture mid left humerus with posterior angulation distally. 4.  Aortic atherosclerosis. Aortic Atherosclerosis (ICD10-I70.0). Electronically Signed   By: Lowella Grip III M.D.   On: 04/24/2017 19:47   Dg Foot Complete Left  Result Date: 04/24/2017 CLINICAL DATA:  Weakness and cough.  Left  foot ulcer on top of foot. EXAM: LEFT FOOT - COMPLETE 3+ VIEW COMPARISON:  None. FINDINGS: Diffuse osteopenia limits evaluation. Within this limitation, no fractures are seen. No bony erosions are identified. IMPRESSION: No fracture.  No bony erosion to suggest osteomyelitis. Electronically Signed   By: Dorise Bullion III M.D   On: 04/24/2017 19:47    Positive ROS: All other systems have been reviewed and were otherwise negative with the exception of those mentioned in the HPI and as above.  Physical Exam: General: Alert, no acute distress Cardiovascular: No pedal edema Respiratory: No cyanosis, no use of accessory musculature GI: No organomegaly, abdomen is soft and non-tender Skin: No lesions in the area of chief complaint Neurologic: Sensation intact distally Psychiatric: Patient is competent for consent with normal mood and affect Lymphatic: No axillary or cervical lymphadenopathy  MUSCULOSKELETAL:    Assessment: Left humerus fracture  Plan: Contact Dr. Roland Rack for any concerns about this fracture.    Park Breed, MD [phone]   04/26/2017 2:32 PM

## 2017-04-27 DIAGNOSIS — I110 Hypertensive heart disease with heart failure: Secondary | ICD-10-CM | POA: Diagnosis not present

## 2017-04-27 DIAGNOSIS — N189 Chronic kidney disease, unspecified: Secondary | ICD-10-CM | POA: Diagnosis not present

## 2017-04-27 DIAGNOSIS — F419 Anxiety disorder, unspecified: Secondary | ICD-10-CM | POA: Diagnosis not present

## 2017-04-27 DIAGNOSIS — Z9889 Other specified postprocedural states: Secondary | ICD-10-CM | POA: Diagnosis not present

## 2017-04-27 DIAGNOSIS — I739 Peripheral vascular disease, unspecified: Secondary | ICD-10-CM | POA: Diagnosis not present

## 2017-04-27 DIAGNOSIS — Z8673 Personal history of transient ischemic attack (TIA), and cerebral infarction without residual deficits: Secondary | ICD-10-CM | POA: Diagnosis not present

## 2017-04-27 DIAGNOSIS — Z8249 Family history of ischemic heart disease and other diseases of the circulatory system: Secondary | ICD-10-CM | POA: Diagnosis not present

## 2017-04-27 DIAGNOSIS — J449 Chronic obstructive pulmonary disease, unspecified: Secondary | ICD-10-CM | POA: Diagnosis not present

## 2017-04-27 DIAGNOSIS — R1312 Dysphagia, oropharyngeal phase: Secondary | ICD-10-CM | POA: Diagnosis not present

## 2017-04-27 DIAGNOSIS — M6281 Muscle weakness (generalized): Secondary | ICD-10-CM | POA: Diagnosis not present

## 2017-04-27 DIAGNOSIS — Z823 Family history of stroke: Secondary | ICD-10-CM | POA: Diagnosis not present

## 2017-04-27 DIAGNOSIS — Z9049 Acquired absence of other specified parts of digestive tract: Secondary | ICD-10-CM | POA: Diagnosis not present

## 2017-04-27 DIAGNOSIS — Z5189 Encounter for other specified aftercare: Secondary | ICD-10-CM | POA: Diagnosis not present

## 2017-04-27 DIAGNOSIS — I5033 Acute on chronic diastolic (congestive) heart failure: Secondary | ICD-10-CM | POA: Diagnosis not present

## 2017-04-27 DIAGNOSIS — J441 Chronic obstructive pulmonary disease with (acute) exacerbation: Secondary | ICD-10-CM | POA: Diagnosis not present

## 2017-04-27 DIAGNOSIS — Z79899 Other long term (current) drug therapy: Secondary | ICD-10-CM | POA: Diagnosis not present

## 2017-04-27 DIAGNOSIS — M79672 Pain in left foot: Secondary | ICD-10-CM | POA: Diagnosis not present

## 2017-04-27 DIAGNOSIS — Z79891 Long term (current) use of opiate analgesic: Secondary | ICD-10-CM | POA: Diagnosis not present

## 2017-04-27 DIAGNOSIS — D649 Anemia, unspecified: Secondary | ICD-10-CM | POA: Diagnosis not present

## 2017-04-27 DIAGNOSIS — J9621 Acute and chronic respiratory failure with hypoxia: Secondary | ICD-10-CM | POA: Diagnosis not present

## 2017-04-27 DIAGNOSIS — I5020 Unspecified systolic (congestive) heart failure: Secondary | ICD-10-CM | POA: Diagnosis not present

## 2017-04-27 DIAGNOSIS — I10 Essential (primary) hypertension: Secondary | ICD-10-CM | POA: Diagnosis not present

## 2017-04-27 DIAGNOSIS — F064 Anxiety disorder due to known physiological condition: Secondary | ICD-10-CM | POA: Diagnosis not present

## 2017-04-27 DIAGNOSIS — N184 Chronic kidney disease, stage 4 (severe): Secondary | ICD-10-CM | POA: Diagnosis not present

## 2017-04-27 DIAGNOSIS — J189 Pneumonia, unspecified organism: Secondary | ICD-10-CM | POA: Diagnosis not present

## 2017-04-27 DIAGNOSIS — I70245 Atherosclerosis of native arteries of left leg with ulceration of other part of foot: Secondary | ICD-10-CM | POA: Diagnosis not present

## 2017-04-27 DIAGNOSIS — Z7982 Long term (current) use of aspirin: Secondary | ICD-10-CM | POA: Diagnosis not present

## 2017-04-27 DIAGNOSIS — I5032 Chronic diastolic (congestive) heart failure: Secondary | ICD-10-CM | POA: Diagnosis not present

## 2017-04-27 DIAGNOSIS — R21 Rash and other nonspecific skin eruption: Secondary | ICD-10-CM | POA: Diagnosis not present

## 2017-04-27 DIAGNOSIS — Z7401 Bed confinement status: Secondary | ICD-10-CM | POA: Diagnosis not present

## 2017-04-27 DIAGNOSIS — R498 Other voice and resonance disorders: Secondary | ICD-10-CM | POA: Diagnosis not present

## 2017-04-27 DIAGNOSIS — K219 Gastro-esophageal reflux disease without esophagitis: Secondary | ICD-10-CM | POA: Diagnosis not present

## 2017-04-27 DIAGNOSIS — L97429 Non-pressure chronic ulcer of left heel and midfoot with unspecified severity: Secondary | ICD-10-CM | POA: Diagnosis not present

## 2017-04-27 DIAGNOSIS — K589 Irritable bowel syndrome without diarrhea: Secondary | ICD-10-CM | POA: Diagnosis not present

## 2017-04-27 DIAGNOSIS — I2510 Atherosclerotic heart disease of native coronary artery without angina pectoris: Secondary | ICD-10-CM | POA: Diagnosis not present

## 2017-04-27 DIAGNOSIS — J9601 Acute respiratory failure with hypoxia: Secondary | ICD-10-CM | POA: Diagnosis not present

## 2017-04-27 DIAGNOSIS — L97519 Non-pressure chronic ulcer of other part of right foot with unspecified severity: Secondary | ICD-10-CM | POA: Diagnosis not present

## 2017-04-27 DIAGNOSIS — Z90710 Acquired absence of both cervix and uterus: Secondary | ICD-10-CM | POA: Diagnosis not present

## 2017-04-27 MED ORDER — CLINDAMYCIN HCL 300 MG PO CAPS: 300.0000 mg | ORAL_CAPSULE | Freq: Three times a day (TID) | ORAL | 0 refills | Status: AC

## 2017-04-27 NOTE — Care Management Important Message (Signed)
Important Message  Patient Details  Name: [name] [name] MRN: [id number] Date of Birth: 1923/09/21   Medicare Important Message Given:  Yes    Shelbie Ammons, RN 04/27/2017, 8:14 AM

## 2017-04-27 NOTE — Discharge Summary (Signed)
[facility] NAME: [name] [name]    MR#:  [id number]  DATE OF BIRTH:  [birth year]  DATE OF ADMISSION:  04/24/2017   ADMITTING PHYSICIAN: [name], MD  DATE OF DISCHARGE: 04/27/2017 PRIMARY CARE PHYSICIAN: Raelyn Number, MD   ADMISSION DIAGNOSIS:  HCAP (healthcare-associated pneumonia) [J18.9] Sepsis, due to unspecified organism ([place]) [A41.9] DISCHARGE DIAGNOSIS:  Principal Problem:   Acute on chronic respiratory failure with hypoxia ([place]) Active Problems:   HCAP (healthcare-associated pneumonia)   Acute on chronic respiratory failure ([place])  SECONDARY DIAGNOSIS:   Past Medical History:  Diagnosis Date  . Anemia   . CHF (congestive heart failure) ([place])   . Coronary artery disease   . Emphysema lung ([place])   . Emphysema of lung ([place])   . GERD (gastroesophageal reflux disease)   . Hyperlipidemia   . Hypertension   . Neuropathy   . Osteoporosis   . Peripheral vascular disease ([place])   . Renal disorder   . Stroke [facility])    HOSPITAL COURSE:   * Acute on chronic respiratory failure With hypoxia secondary to healthcare associated pneumonia with leukocytosis. She has been treated withIV vancomycin and cefepime. Leukocytosis is improving. Change to clindamhycin for 5 more days.  * COPD, no acute exacerbation. Continue inhalers and nebulizer treatment.  * Hypertension Cont home meds.  * Hx of CAD Cont ASA, Plavix  CKD stage 4, stable.  * Decreased appetite She passed nurse soaring schooling, better oral intake.  Old Fracture mid left humerus. Pain control.  DISCHARGE CONDITIONS:  Stable, discharge to SNF today. CONSULTS OBTAINED:  Treatment Team:  Earnestine Leys, MD DRUG ALLERGIES:   Allergies  Allergen Reactions  . Cholesterol Other (See Comments)    Patient states she is allergic to a cholesterol medication, which causes flu-like symptoms. Name is unknown.  . Morphine And Related       Heart problems   . Zoloft [Sertraline Hcl] Swelling   83
DISCHARGE MEDICATIONS:   Allergies as of 04/27/2017      Reactions   Cholesterol Other (See Comments)   Patient states she is allergic to a cholesterol medication, which causes flu-like symptoms. Name is unknown.   Morphine And Related    Heart problems   Zoloft [sertraline Hcl] Swelling      Medication List    STOP taking these medications   amoxicillin-clavulanate 500-125 MG tablet Commonly known as:  AUGMENTIN     TAKE these medications   acetaminophen 325 MG tablet Commonly known as:  TYLENOL Take 650 mg by mouth every 6 (six) hours as needed for mild pain or moderate pain.   albuterol (2.5 MG/3ML) 0.083% nebulizer solution Commonly known as:  PROVENTIL Take 2.5 mg by nebulization 2 (two) times daily.   albuterol (2.5 MG/3ML) 0.083% nebulizer solution Commonly known as:  PROVENTIL Take 3 mLs (2.5 mg total) by nebulization every 6 (six) hours as needed for wheezing or shortness of breath.   ALPRAZolam 0.25 MG tablet Commonly known as:  XANAX Take 0.5 tablets (0.125 mg total) by mouth 2 (two) times daily as needed for anxiety or sleep.   aspirin 81 MG tablet Take 81 mg by mouth daily.   carvedilol 25 MG tablet Commonly known as:  COREG Take 1 tablet (25 mg total) by mouth 2 (two) times daily.   celecoxib 100 MG capsule Commonly known as:  CELEBREX Take 100 mg by mouth every other day as needed.   Cholecalciferol 2000  units Caps Take 4,000 Units by mouth daily.   clindamycin 300 MG capsule Commonly known as:  CLEOCIN Take 1 capsule (300 mg total) by mouth 3 (three) times daily.   clopidogrel 75 MG tablet Commonly known as:  PLAVIX Take 1 tablet by mouth daily.   cyanocobalamin 1000 MCG tablet Take 1,000 mcg by mouth daily.   dicyclomine 20 MG tablet Commonly known as:  BENTYL Take 1 tablet (20 mg total) by mouth 3 (three) times daily as needed for spasms.   feeding supplement Liqd Take 1  Container by mouth daily.   FLECTOR 1.3 % Ptch Generic drug:  diclofenac Place 1 patch onto the skin every 12 (twelve) hours.   fluticasone 50 MCG/ACT nasal spray Commonly known as:  FLONASE Place 2 sprays into both nostrils daily.   furosemide 20 MG tablet Commonly known as:  LASIX Take 20 mg by mouth daily.   gabapentin 400 MG capsule Commonly known as:  NEURONTIN Take 400 mg by mouth at bedtime.   gabapentin 100 MG capsule Commonly known as:  NEURONTIN Take 2 capsules (200 mg total) by mouth 3 (three) times daily.   LACTOBACILLUS PO Take 1 capsule by mouth daily.   loratadine 10 MG tablet Commonly known as:  CLARITIN Take 10 mg by mouth daily.   neomycin-bacitracin-polymyxin [phone] ointment Apply 1 application topically daily.   nitroGLYCERIN 0.2 mg/hr patch Commonly known as:  NITRODUR - Dosed in mg/24 hr Place 1 patch onto the skin daily.   ondansetron 4 MG tablet Commonly known as:  ZOFRAN Take 1 tablet (4 mg total) by mouth every 8 (eight) hours as needed for nausea or vomiting.   polyethylene glycol packet Commonly known as:  MIRALAX / GLYCOLAX Take 17 g by mouth daily.   potassium chloride 10 MEQ tablet Commonly known as:  K-DUR,KLOR-CON Take 1 tablet (10 mEq total) by mouth daily.   ranitidine 150 MG capsule Commonly known as:  ZANTAC Take 150 mg by mouth 2 (two) times daily.   sennosides-docusate sodium 8.6-50 MG tablet Commonly known as:  SENOKOT-S Take 2 tablets by mouth daily.   Spacer/Aero-Hold Chamber Mask Misc 1 Units by Does not apply route every 6 (six) hours as needed.   tiotropium 18 MCG inhalation capsule Commonly known as:  SPIRIVA Place 1 capsule (18 mcg total) into inhaler and inhale daily.   traMADol 50 MG tablet Commonly known as:  ULTRAM Take 1 tablet (50 mg total) by mouth every 12 (twelve) hours as needed for moderate pain or severe pain.        DISCHARGE INSTRUCTIONS:  See AVS.  If you experience worsening of  your admission symptoms, develop shortness of breath, life threatening emergency, suicidal or homicidal thoughts you must seek medical attention immediately by calling 911 or calling your MD immediately  if symptoms less severe.  You Must read complete instructions/literature along with all the possible adverse reactions/side effects for all the Medicines you take and that have been prescribed to you. Take any new Medicines after you have completely understood and accpet all the possible adverse reactions/side effects.   Please note  You were cared for by a hospitalist during your hospital stay. If you have any questions about your discharge medications or the care you received while you were in the hospital after you are discharged, you can call the unit and asked to speak with the hospitalist on call if the hospitalist that took care of you is not available. Once you are discharged,  your primary care physician will handle any further medical issues. Please note that NO REFILLS for any discharge medications will be authorized once you are discharged, as it is imperative that you return to your primary care physician (or establish a relationship with a primary care physician if you do not have one) for your aftercare needs so that they can reassess your need for medications and monitor your lab values.    On the day of Discharge:  VITAL SIGNS:  Blood pressure (!) 113/49, pulse 91, temperature 98.7 F (37.1 C), temperature source Oral, resp. rate 20, height 5\' 6"  (1.676 m), weight 136 lb 14.4 oz (62.1 kg), SpO2 99 %. PHYSICAL EXAMINATION:  GENERAL:  83 y.o.-year-old patient lying in the bed with no acute distress.  EYES: Pupils equal, round, reactive to light and accommodation. No scleral icterus. Extraocular muscles intact.  HEENT: Head atraumatic, normocephalic. Oropharynx and nasopharynx clear.  NECK:  Supple, no jugular venous distention. No thyroid enlargement, no tenderness.  LUNGS: Normal  breath sounds bilaterally, no wheezing, rales,rhonchi or crepitation. No use of accessory muscles of respiration.  CARDIOVASCULAR: S1, S2 normal. No murmurs, rubs, or gallops.  ABDOMEN: Soft, non-tender, non-distended. Bowel sounds present. No organomegaly or mass.  EXTREMITIES: No pedal edema, cyanosis, or clubbing.  NEUROLOGIC: Cranial nerves II through XII are intact. Muscle strength 4/5 in all extremities. Sensation intact. Gait not checked.  PSYCHIATRIC: The patient is alert and oriented x 3.  SKIN: No obvious rash, lesion, or ulcer.  DATA REVIEW:   CBC  Recent Labs Lab 04/25/17 0356  WBC 12.8*  HGB 8.5*  HCT 25.5*  PLT 153    Chemistries   Recent Labs Lab 04/24/17 1851 04/25/17 0356 04/26/17 0511  NA 132* 135  --   K 4.8 4.0  --   CL 93* 96*  --   CO2 29 31  --   GLUCOSE 143* 121*  --   BUN 55* 55*  --   CREATININE 1.85* 1.95* 1.81*  CALCIUM 9.6 9.3  --   AST 29  --   --   ALT 10*  --   --   ALKPHOS 130*  --   --   BILITOT 1.1  --   --      Microbiology Results  Results for orders placed or performed during the hospital encounter of 04/24/17  Urine culture     Status: None   Collection Time: 04/24/17  7:17 PM  Result Value Ref Range Status   Specimen Description URINE, CATHETERIZED  Final   Special Requests NONE  Final   Culture   Final    NO GROWTH Performed at [facility], [place] [address] [place], [place] [address]    Report Status 04/26/2017 FINAL  Final    RADIOLOGY:  No results found.   Management plans discussed with the patient, family and they are in agreement.  CODE STATUS: DNR   TOTAL TIME TAKING CARE OF THIS PATIENT: 35 minutes.    Demetrios Loll M.D on 04/27/2017 at 11:19 AM  Between 7am to 6pm - Pager - [phone]  After 6pm go to [email or web address] - Proofreader  [facility]  Office  [phone]  CC: Primary care physician; Raelyn Number, MD   Note: This dictation was prepared with  Dragon dictation along with smaller phrase technology. Any transcriptional errors that result from this process are unintentional.

## 2017-04-27 NOTE — Progress Notes (Signed)
Pt to discharge back to [facility]. Via ems.  Alert. No distress. 02 2 liters which is chronic.  [facility] called to transport pt.

## 2017-04-27 NOTE — Clinical Social Work Note (Signed)
Pt is ready for discharge today and will return to [facility]. Facility is ready to accept pt as they have received discharge information. Pt and daughter are aware and agreeable to discharge plan. RN will call report. [facility] will provide transportation. CSW is signing off as no further needs identified.   Darden Dates, MSW, LCSW  Clinical Social Worker  [phone]

## 2017-04-27 NOTE — Progress Notes (Signed)
  Speech Language Pathology Treatment: Dysphagia  Patient Details Name: [name] [name] MRN: [id number] DOB: [birth year] Today's Date: 04/27/2017 Time: 3353-3174 SLP Time Calculation (min) (ACUTE ONLY): 35 min  Assessment / Plan / Recommendation Clinical Impression  Pt seen for brief toleration of diet and further education on aspiration precautions and diet consistency. Pt continues to appear to present w/ adequate oropharyngeal phase swallow function w/ reduced risk for aspiration when following general aspiration precautions. Pt was consuming po trials of thin liquids via straw and soft solids at lunch meal w/ no overt s/s of aspiration noted; no oral phase deficits noted w/ bolus management and oral clearing. Pt fed self w/ setup assist. Education given on general aspiration precautions including small bite/sips slowly and less talking during meals. Pt denied any difficulty swallowing stating she "got her sausage this morning" but was happy to talk about general aspiration precautions to "make my swallowing safer". No further ST services indicated. NSG updated. Of note, pt does swallow her Pills w/ Applesauce at baseline.    HPI HPI: Pt is a 83 y.o. female with a known history of CHF, GERD, coronary artery disease, emphysema, COPD, hyperlipidemia, hypertension, renal disorder, stroke -was admitted in hospital 3 weeks ago and sent to rehabilitation after treating for COPD. At her baseline she is on oxygen at home. At rehabilitation she still requiring a lot of support and therapy for her day to day activities. For last few days she has decreased oral appetite and staying mostly sleepy. Also complaining of headache and her ulcers on her leg is getting worse. At admission, pt was positive for pneumonia on CXR and elevated wbc.      SLP Plan  All goals met       Recommendations  Diet recommendations: Dysphagia 3 (mechanical soft);Thin liquid Liquids provided via: Cup;Straw (monitor any straw  use) Medication Administration: Whole meds with puree Supervision: Patient able to self feed;Intermittent supervision to cue for compensatory strategies Compensations: Minimize environmental distractions;Slow rate;Small sips/bites;Lingual sweep for clearance of pocketing;Multiple dry swallows after each bite/sip;Follow solids with liquid Postural Changes and/or Swallow Maneuvers: Seated upright 90 degrees;Upright 30-60 min after meal                General recommendations:  (Dietician f/u) Oral Care Recommendations: Oral care BID;Patient independent with oral care;Staff/trained caregiver to provide oral care Follow up Recommendations: None SLP Visit Diagnosis: Dysphagia, oropharyngeal phase (R13.12) Plan: All goals met       GO               [name] Kenner, MS, CCC-SLP [name],[name] 04/27/2017, 1:43 PM

## 2017-04-27 NOTE — Discharge Instructions (Signed)
Fall precaution. Continue O2 [place] 2L Clindamycin for 5 days.

## 2017-04-30 ENCOUNTER — Other Ambulatory Visit: Payer: Self-pay | Admitting: *Deleted

## 2017-04-30 DIAGNOSIS — L97519 Non-pressure chronic ulcer of other part of right foot with unspecified severity: Secondary | ICD-10-CM | POA: Diagnosis not present

## 2017-04-30 NOTE — Patient Outreach (Signed)
[facility]) Care Management  04/30/2017  ELFREDA BLANCHET 08/17/1923 [id number]   Communication with Donnalee Curry, SW at facility. She reports that patient is from an ALF. Daughter wants patient at higher level of care and to remain LTC at facility. Daughter has applied for Medicaid and given up the ALF bed.   Plan to sign off as patient will remain as LTC resident, no plans to discharge home.   [name]. [name], RN, BSN, [place] [phone]) Business Cell  [phone]) Toll Free Office

## 2017-05-01 DIAGNOSIS — I5033 Acute on chronic diastolic (congestive) heart failure: Secondary | ICD-10-CM | POA: Diagnosis not present

## 2017-05-01 DIAGNOSIS — N189 Chronic kidney disease, unspecified: Secondary | ICD-10-CM | POA: Diagnosis not present

## 2017-05-01 DIAGNOSIS — F419 Anxiety disorder, unspecified: Secondary | ICD-10-CM | POA: Diagnosis not present

## 2017-05-01 DIAGNOSIS — D649 Anemia, unspecified: Secondary | ICD-10-CM | POA: Diagnosis not present

## 2017-05-01 DIAGNOSIS — M79672 Pain in left foot: Secondary | ICD-10-CM | POA: Diagnosis not present

## 2017-05-01 DIAGNOSIS — M6281 Muscle weakness (generalized): Secondary | ICD-10-CM | POA: Diagnosis not present

## 2017-05-01 DIAGNOSIS — K589 Irritable bowel syndrome without diarrhea: Secondary | ICD-10-CM | POA: Diagnosis not present

## 2017-05-01 DIAGNOSIS — J189 Pneumonia, unspecified organism: Secondary | ICD-10-CM | POA: Diagnosis not present

## 2017-05-01 DIAGNOSIS — J449 Chronic obstructive pulmonary disease, unspecified: Secondary | ICD-10-CM | POA: Diagnosis not present

## 2017-05-01 DIAGNOSIS — I10 Essential (primary) hypertension: Secondary | ICD-10-CM | POA: Diagnosis not present

## 2017-05-01 DIAGNOSIS — J9601 Acute respiratory failure with hypoxia: Secondary | ICD-10-CM | POA: Diagnosis not present

## 2017-05-04 DIAGNOSIS — F419 Anxiety disorder, unspecified: Secondary | ICD-10-CM | POA: Diagnosis not present

## 2017-05-04 DIAGNOSIS — I10 Essential (primary) hypertension: Secondary | ICD-10-CM | POA: Diagnosis not present

## 2017-05-04 DIAGNOSIS — N184 Chronic kidney disease, stage 4 (severe): Secondary | ICD-10-CM | POA: Diagnosis not present

## 2017-05-04 DIAGNOSIS — I5033 Acute on chronic diastolic (congestive) heart failure: Secondary | ICD-10-CM | POA: Diagnosis not present

## 2017-05-04 DIAGNOSIS — J9601 Acute respiratory failure with hypoxia: Secondary | ICD-10-CM | POA: Diagnosis not present

## 2017-05-04 DIAGNOSIS — D649 Anemia, unspecified: Secondary | ICD-10-CM | POA: Diagnosis not present

## 2017-05-04 DIAGNOSIS — M6281 Muscle weakness (generalized): Secondary | ICD-10-CM | POA: Diagnosis not present

## 2017-05-04 DIAGNOSIS — M79672 Pain in left foot: Secondary | ICD-10-CM | POA: Diagnosis not present

## 2017-05-04 DIAGNOSIS — K589 Irritable bowel syndrome without diarrhea: Secondary | ICD-10-CM | POA: Diagnosis not present

## 2017-05-04 DIAGNOSIS — J449 Chronic obstructive pulmonary disease, unspecified: Secondary | ICD-10-CM | POA: Diagnosis not present

## 2017-05-07 DIAGNOSIS — L97519 Non-pressure chronic ulcer of other part of right foot with unspecified severity: Secondary | ICD-10-CM | POA: Diagnosis not present

## 2017-05-11 ENCOUNTER — Ambulatory Visit: Payer: No Typology Code available for payment source | Attending: Family | Admitting: Family

## 2017-05-11 ENCOUNTER — Encounter: Payer: Self-pay | Admitting: Family

## 2017-05-11 VITALS — BP 110/48 | HR 70 | Resp 18 | Ht 66.0 in | Wt 138.2 lb

## 2017-05-11 DIAGNOSIS — Z79899 Other long term (current) drug therapy: Secondary | ICD-10-CM | POA: Diagnosis not present

## 2017-05-11 DIAGNOSIS — Z8249 Family history of ischemic heart disease and other diseases of the circulatory system: Secondary | ICD-10-CM | POA: Insufficient documentation

## 2017-05-11 DIAGNOSIS — Z9049 Acquired absence of other specified parts of digestive tract: Secondary | ICD-10-CM | POA: Insufficient documentation

## 2017-05-11 DIAGNOSIS — I5032 Chronic diastolic (congestive) heart failure: Secondary | ICD-10-CM

## 2017-05-11 DIAGNOSIS — Z90710 Acquired absence of both cervix and uterus: Secondary | ICD-10-CM | POA: Insufficient documentation

## 2017-05-11 DIAGNOSIS — K219 Gastro-esophageal reflux disease without esophagitis: Secondary | ICD-10-CM | POA: Insufficient documentation

## 2017-05-11 DIAGNOSIS — Z7982 Long term (current) use of aspirin: Secondary | ICD-10-CM | POA: Insufficient documentation

## 2017-05-11 DIAGNOSIS — Z8673 Personal history of transient ischemic attack (TIA), and cerebral infarction without residual deficits: Secondary | ICD-10-CM | POA: Insufficient documentation

## 2017-05-11 DIAGNOSIS — Z79891 Long term (current) use of opiate analgesic: Secondary | ICD-10-CM | POA: Insufficient documentation

## 2017-05-11 DIAGNOSIS — I110 Hypertensive heart disease with heart failure: Secondary | ICD-10-CM | POA: Insufficient documentation

## 2017-05-11 DIAGNOSIS — Z9889 Other specified postprocedural states: Secondary | ICD-10-CM | POA: Insufficient documentation

## 2017-05-11 DIAGNOSIS — Z823 Family history of stroke: Secondary | ICD-10-CM | POA: Insufficient documentation

## 2017-05-11 DIAGNOSIS — I10 Essential (primary) hypertension: Secondary | ICD-10-CM

## 2017-05-11 NOTE — Progress Notes (Signed)
Patient ID: [name] [name], female    DOB: [birth year], 83 y.o.   MRN: [id number]  HPI  Ms [name] [name] is a 83 y/o female with a history of stroke, renal disorder, PVD, osteoporosis, HTN, hyperlipidemia, GERD, emphysema, CAD, anemia and chronic heart failure.   Last echo was done 11/09/16 with an EF of 55-60% with trivial TR.   Admitted 04/24/17 due to HCAP. Initially treated with IV antibiotics and then transitioned to oral antibiotics. Continued inhalers and nebulizer therapy. Discharged to SNF after 3 days. Admitted 04/01/17 with HF/COPD exacerbation. Initially need IV diuretics. On levaquin, nebulizer and inhalers. Flutter valve and chest percussion were added. Discharged to SNF after 5 days. Admitted 02/18/17 due to COPD/HF exacerbation. Given prednisone and antibiotics along with IV diuretics initially. Discharged to SNF after 3 days. Was in the ED 02/15/17 due to COPD exacerbation. Treated and released. In the ED on 01/15/17 due to weakness and shortness of breath. Initial O2 sat by EMS was 70% but that's because her oxygen wasn't in her nose. Once oxygen placed correctly, shortness of breath improved. Discharged back to [name]. Admitted 11/16/16 due to bradycardia and hypotension. She was given 2L NS bolus and glucagon and neosynephrine drip was started. Was transferred to ICU for further monitoring and treatment. Cardiology consult done. Discharged back to assisted living with PT after 2 days. Previous admission on 11/07/16 due to rapid atrial fibrillation. Cardiology consult was done. Initially treated with IV diuretics and transitioned to oral diuretics. Discharged after 2 days.  She presents today for her follow-up visit with a chief complaint of moderate fatigue with little exertion. She describes this as chronic in nature and seems to be worsening. She feels like her fatigue is related to her low blood count. She has associated chest tightness, dizziness and difficulty sleeping along with this.   Past  Medical History:  Diagnosis Date  . Anemia   . CHF (congestive heart failure) ([name])   . Coronary artery disease   . Emphysema lung ([name])   . Emphysema of lung ([name])   . GERD (gastroesophageal reflux disease)   . Hyperlipidemia   . Hypertension   . Neuropathy   . Osteoporosis   . Peripheral vascular disease ([name])   . Renal disorder   . Stroke [facility])    Past Surgical History:  Procedure Laterality Date  . ABDOMINAL HYSTERECTOMY  1982  . CATARACT EXTRACTION    . CHOLECYSTECTOMY  1992  . JOINT REPLACEMENT  2005   Family History  Problem Relation Age of Onset  . Colon cancer Sister   . CVA Mother   . CAD Mother   . Hypertension Mother   . CAD Father    Social History  Substance Use Topics  . Smoking status: Never Smoker  . Smokeless tobacco: Never Used  . Alcohol use No   Allergies  Allergen Reactions  . Cholesterol Other (See Comments)    Patient states she is allergic to a cholesterol medication, which causes flu-like symptoms. Name is unknown.  . Morphine And Related     Heart problems   . Zoloft [Sertraline Hcl] Swelling   Prior to Admission medications   Medication Sig Start Date End Date Taking? Authorizing Provider  acetaminophen (TYLENOL) 325 MG tablet Take 650 mg by mouth every 6 (six) hours as needed for mild pain or moderate pain.   Yes [provider]  albuterol (PROVENTIL) (2.5 MG/3ML) 0.083% nebulizer solution Take 3 mLs (2.5 mg total) by nebulization every 6 (  six) hours as needed for wheezing or shortness of breath. 04/06/17  Yes [name], MD  albuterol (PROVENTIL) (2.5 MG/3ML) 0.083% nebulizer solution Take 2.5 mg by nebulization 2 (two) times daily.   Yes [provider]  ALPRAZolam (XANAX) 0.25 MG tablet Take 0.5 tablets (0.125 mg total) by mouth 2 (two) times daily as needed for anxiety or sleep. 04/06/17  Yes [name], MD  aspirin 81 MG tablet Take 81 mg by mouth daily.   Yes [provider]   carvedilol (COREG) 25 MG tablet Take 1 tablet (25 mg total) by mouth 2 (two) times daily. 02/20/17  Yes Mody, Ulice Bold, MD  celecoxib (CELEBREX) 100 MG capsule Take 100 mg by mouth every other day as needed. 10/15/16  Yes [provider]  Cholecalciferol 2000 units CAPS Take 4,000 Units by mouth daily.    Yes [provider]  clopidogrel (PLAVIX) 75 MG tablet Take 1 tablet by mouth daily. 09/18/13  Yes [provider]  cyanocobalamin 1000 MCG tablet Take 1,000 mcg by mouth daily.    Yes [provider]  dicyclomine (BENTYL) 20 MG tablet Take 1 tablet (20 mg total) by mouth 3 (three) times daily as needed for spasms. 11/28/15  Yes Daymon Larsen, MD  feeding supplement (BOOST HIGH PROTEIN) LIQD Take 1 Container by mouth daily.   Yes [provider]  FLECTOR 1.3 % PTCH Place 1 patch onto the skin every 12 (twelve) hours.  09/18/13  Yes [provider]  fluticasone (FLONASE) 50 MCG/ACT nasal spray Place 2 sprays into both nostrils daily.   Yes [provider]  furosemide (LASIX) 20 MG tablet Take 20 mg by mouth daily.   Yes [provider]  gabapentin (NEURONTIN) 100 MG capsule Take 2 capsules (200 mg total) by mouth 3 (three) times daily. 04/06/17  Yes [name], MD  gabapentin (NEURONTIN) 400 MG capsule Take 400 mg by mouth at bedtime.   Yes [provider]  LACTOBACILLUS PO Take 1 capsule by mouth daily.   Yes [provider]  loratadine (CLARITIN) 10 MG tablet Take 10 mg by mouth daily.   Yes [provider]  neomycin-bacitracin-polymyxin (NEOSPORIN) [phone] ointment Apply 1 application topically daily.   Yes [provider]  nitroGLYCERIN (NITRODUR - DOSED IN MG/24 HR) 0.2 mg/hr patch Place 1 patch onto the skin daily.  09/18/13  Yes [provider]  ondansetron (ZOFRAN) 4 MG tablet Take 1 tablet (4 mg total) by mouth every 8 (eight) hours as needed for nausea or vomiting.  11/28/15  Yes Daymon Larsen, MD  polyethylene glycol [facility] Flor[facility]aveling) packet Take 17 g by mouth daily.   Yes [provider]  potassium chloride SA (K-DUR,KLOR-CON) 10 MEQ tablet Take 1 tablet (10 mEq total) by mouth daily. 11/09/16  Yes [name], MD  ranitidine (ZANTAC) 150 MG capsule Take 150 mg by mouth 2 (two) times daily.   Yes [provider]  sennosides-docusate sodium (SENOKOT-S) 8.6-50 MG tablet Take 2 tablets by mouth daily.   Yes [provider]  Spacer/Aero-Hold Chamber Mask MISC 1 Units by Does not apply route every 6 (six) hours as needed. 02/15/17  Yes Darel Hong, MD  tiotropium (SPIRIVA) 18 MCG inhalation capsule Place 1 capsule (18 mcg total) into inhaler and inhale daily. 04/06/17  Yes [name], MD  traMADol (ULTRAM) 50 MG tablet Take 1 tablet (50 mg total) by mouth every 12 (twelve) hours as needed for moderate pain or severe pain.  04/06/17  Yes [name], MD   Review of Systems  Constitutional: Positive for appetite change and fatigue.  HENT: Negative for congestion, postnasal drip and sore throat.   Eyes: Negative.   Respiratory: Positive for chest tightness (at times). Negative for cough and shortness of breath.   Cardiovascular: Negative for chest pain, palpitations and leg swelling.  Gastrointestinal: Negative for abdominal distention and abdominal pain.  Endocrine: Negative.   Genitourinary: Negative.   Musculoskeletal: Negative for arthralgias and back pain.  Skin: Positive for wound (top of left foot).  Allergic/Immunologic: Negative.   Neurological: Positive for dizziness and numbness (bilateral feet). Negative for light-headedness.  Hematological: Negative for adenopathy. Bruises/bleeds easily.  Psychiatric/Behavioral: Positive for sleep disturbance (difficulty sleeping due to ulcer on top of foot). Negative for dysphoric mood. The patient is not nervous/anxious.    Vitals:   05/11/17 1311   BP: (!) 110/48  Pulse: 70  Resp: 18  SpO2: 100%  Weight: 138 lb 4 oz (62.7 kg)  Height: 5\' 6"  (1.676 m)   Wt Readings from Last 3 Encounters:  05/11/17 138 lb 4 oz (62.7 kg)  04/24/17 136 lb 14.4 oz (62.1 kg)  04/06/17 137 lb 6.4 oz (62.3 kg)    Lab Results  Component Value Date   CREATININE 1.81 (H) 04/26/2017   CREATININE 1.95 (H) 04/25/2017   CREATININE 1.85 (H) 04/24/2017   Physical Exam  Constitutional: She is oriented to person, place, and time. She appears well-developed and well-nourished.  HENT:  Head: Normocephalic and atraumatic.  Neck: Normal range of motion. Neck supple. No JVD present.  Cardiovascular: Normal rate and regular rhythm.   Pulmonary/Chest: Effort normal. She has no wheezes. She has no rales.  Abdominal: Soft. She exhibits no distension. There is no tenderness.  Musculoskeletal: She exhibits no edema or tenderness.  Neurological: She is alert and oriented to person, place, and time.  Skin: Skin is warm and dry.  Psychiatric: She has a normal mood and affect. Her behavior is normal. Thought content normal.  Nursing note and vitals reviewed.    Assessment & Plan:  1: Chronic heart failure with preserved ejection fraction- - NYHA class III - euvolemic today - being weighed daily at the facility. Discussed the importance of weighing daily and calling for an overnight weight gain of >2 pounds or a weekly weight gain of >5 pounds.  - not adding any salt to her food. Importance of closely following a 2000mg  sodium diet was discussed. - saw cardiologist Clayborn Bigness) on 02/16/17 - wound on top of left food is being changed by nurse at facility - wearing oxygen at 2L around the clock  2: HTN- - BP looks good today - continue medications - patient now being followed by PCP at [facility] done 04/25/17 reviewed and shows sodium 135, potassium 4.0 and GFR 21   Medication list was reviewed.  Return here in 6 months or sooner for any  questions/problems before then.

## 2017-05-11 NOTE — Patient Instructions (Signed)
Continue weighing daily and call for an overnight weight gain of > 2 pounds or a weekly weight gain of >5 pounds. 

## 2017-05-14 DIAGNOSIS — I70245 Atherosclerosis of native arteries of left leg with ulceration of other part of foot: Secondary | ICD-10-CM | POA: Diagnosis not present

## 2017-05-18 DIAGNOSIS — N184 Chronic kidney disease, stage 4 (severe): Secondary | ICD-10-CM | POA: Diagnosis not present

## 2017-05-18 DIAGNOSIS — M79672 Pain in left foot: Secondary | ICD-10-CM | POA: Diagnosis not present

## 2017-05-18 DIAGNOSIS — M6281 Muscle weakness (generalized): Secondary | ICD-10-CM | POA: Diagnosis not present

## 2017-05-18 DIAGNOSIS — I5033 Acute on chronic diastolic (congestive) heart failure: Secondary | ICD-10-CM | POA: Diagnosis not present

## 2017-05-18 DIAGNOSIS — D649 Anemia, unspecified: Secondary | ICD-10-CM | POA: Diagnosis not present

## 2017-05-18 DIAGNOSIS — F419 Anxiety disorder, unspecified: Secondary | ICD-10-CM | POA: Diagnosis not present

## 2017-05-18 DIAGNOSIS — J9601 Acute respiratory failure with hypoxia: Secondary | ICD-10-CM | POA: Diagnosis not present

## 2017-05-18 DIAGNOSIS — K589 Irritable bowel syndrome without diarrhea: Secondary | ICD-10-CM | POA: Diagnosis not present

## 2017-05-18 DIAGNOSIS — J449 Chronic obstructive pulmonary disease, unspecified: Secondary | ICD-10-CM | POA: Diagnosis not present

## 2017-05-21 DIAGNOSIS — L97429 Non-pressure chronic ulcer of left heel and midfoot with unspecified severity: Secondary | ICD-10-CM | POA: Diagnosis not present

## 2017-05-28 ENCOUNTER — Ambulatory Visit: Payer: Medicare Other | Admitting: Family

## 2017-05-28 DIAGNOSIS — L97429 Non-pressure chronic ulcer of left heel and midfoot with unspecified severity: Secondary | ICD-10-CM | POA: Diagnosis not present

## 2017-05-31 DIAGNOSIS — L89892 Pressure ulcer of other site, stage 2: Secondary | ICD-10-CM | POA: Diagnosis not present

## 2017-05-31 DIAGNOSIS — K760 Fatty (change of) liver, not elsewhere classified: Secondary | ICD-10-CM | POA: Diagnosis not present

## 2017-05-31 DIAGNOSIS — B3789 Other sites of candidiasis: Secondary | ICD-10-CM | POA: Diagnosis not present

## 2017-05-31 DIAGNOSIS — S91302A Unspecified open wound, left foot, initial encounter: Secondary | ICD-10-CM | POA: Diagnosis not present

## 2017-05-31 DIAGNOSIS — N184 Chronic kidney disease, stage 4 (severe): Secondary | ICD-10-CM | POA: Diagnosis not present

## 2017-05-31 DIAGNOSIS — F418 Other specified anxiety disorders: Secondary | ICD-10-CM | POA: Diagnosis not present

## 2017-05-31 DIAGNOSIS — K1379 Other lesions of oral mucosa: Secondary | ICD-10-CM | POA: Diagnosis not present

## 2017-05-31 DIAGNOSIS — R310 Gross hematuria: Secondary | ICD-10-CM | POA: Diagnosis not present

## 2017-05-31 DIAGNOSIS — M6281 Muscle weakness (generalized): Secondary | ICD-10-CM | POA: Diagnosis not present

## 2017-05-31 DIAGNOSIS — G4709 Other insomnia: Secondary | ICD-10-CM | POA: Diagnosis not present

## 2017-05-31 DIAGNOSIS — J449 Chronic obstructive pulmonary disease, unspecified: Secondary | ICD-10-CM | POA: Diagnosis not present

## 2017-05-31 DIAGNOSIS — S42302D Unspecified fracture of shaft of humerus, left arm, subsequent encounter for fracture with routine healing: Secondary | ICD-10-CM | POA: Diagnosis not present

## 2017-05-31 DIAGNOSIS — R319 Hematuria, unspecified: Secondary | ICD-10-CM | POA: Diagnosis not present

## 2017-05-31 DIAGNOSIS — J9600 Acute respiratory failure, unspecified whether with hypoxia or hypercapnia: Secondary | ICD-10-CM | POA: Diagnosis not present

## 2017-05-31 DIAGNOSIS — R1031 Right lower quadrant pain: Secondary | ICD-10-CM | POA: Diagnosis not present

## 2017-05-31 DIAGNOSIS — S91302D Unspecified open wound, left foot, subsequent encounter: Secondary | ICD-10-CM | POA: Diagnosis not present

## 2017-05-31 DIAGNOSIS — M79622 Pain in left upper arm: Secondary | ICD-10-CM | POA: Diagnosis not present

## 2017-05-31 DIAGNOSIS — D510 Vitamin B12 deficiency anemia due to intrinsic factor deficiency: Secondary | ICD-10-CM | POA: Diagnosis not present

## 2017-05-31 DIAGNOSIS — S42352K Displaced comminuted fracture of shaft of humerus, left arm, subsequent encounter for fracture with nonunion: Secondary | ICD-10-CM | POA: Diagnosis not present

## 2017-05-31 DIAGNOSIS — N390 Urinary tract infection, site not specified: Secondary | ICD-10-CM | POA: Diagnosis not present

## 2017-05-31 DIAGNOSIS — L988 Other specified disorders of the skin and subcutaneous tissue: Secondary | ICD-10-CM | POA: Diagnosis not present

## 2017-05-31 DIAGNOSIS — E559 Vitamin D deficiency, unspecified: Secondary | ICD-10-CM | POA: Diagnosis not present

## 2017-05-31 DIAGNOSIS — J189 Pneumonia, unspecified organism: Secondary | ICD-10-CM | POA: Diagnosis not present

## 2017-05-31 DIAGNOSIS — K5909 Other constipation: Secondary | ICD-10-CM | POA: Diagnosis not present

## 2017-05-31 DIAGNOSIS — R262 Difficulty in walking, not elsewhere classified: Secondary | ICD-10-CM | POA: Diagnosis not present

## 2017-05-31 DIAGNOSIS — S42302A Unspecified fracture of shaft of humerus, left arm, initial encounter for closed fracture: Secondary | ICD-10-CM | POA: Diagnosis not present

## 2017-05-31 DIAGNOSIS — L89894 Pressure ulcer of other site, stage 4: Secondary | ICD-10-CM | POA: Diagnosis not present

## 2017-05-31 DIAGNOSIS — K219 Gastro-esophageal reflux disease without esophagitis: Secondary | ICD-10-CM | POA: Diagnosis not present

## 2017-05-31 DIAGNOSIS — N938 Other specified abnormal uterine and vaginal bleeding: Secondary | ICD-10-CM | POA: Diagnosis not present

## 2017-05-31 DIAGNOSIS — D464 Refractory anemia, unspecified: Secondary | ICD-10-CM | POA: Diagnosis not present

## 2017-05-31 DIAGNOSIS — I5189 Other ill-defined heart diseases: Secondary | ICD-10-CM | POA: Diagnosis not present

## 2017-05-31 DIAGNOSIS — M25512 Pain in left shoulder: Secondary | ICD-10-CM | POA: Diagnosis not present

## 2017-05-31 DIAGNOSIS — G6289 Other specified polyneuropathies: Secondary | ICD-10-CM | POA: Diagnosis not present

## 2017-05-31 DIAGNOSIS — N939 Abnormal uterine and vaginal bleeding, unspecified: Secondary | ICD-10-CM | POA: Diagnosis not present

## 2017-05-31 DIAGNOSIS — R1319 Other dysphagia: Secondary | ICD-10-CM | POA: Diagnosis not present

## 2017-05-31 DIAGNOSIS — L89893 Pressure ulcer of other site, stage 3: Secondary | ICD-10-CM | POA: Diagnosis not present

## 2017-06-03 DIAGNOSIS — F418 Other specified anxiety disorders: Secondary | ICD-10-CM | POA: Diagnosis not present

## 2017-06-03 DIAGNOSIS — I5189 Other ill-defined heart diseases: Secondary | ICD-10-CM | POA: Diagnosis not present

## 2017-06-03 DIAGNOSIS — R1319 Other dysphagia: Secondary | ICD-10-CM | POA: Diagnosis not present

## 2017-06-03 DIAGNOSIS — M6281 Muscle weakness (generalized): Secondary | ICD-10-CM | POA: Diagnosis not present

## 2017-06-11 DIAGNOSIS — S91302D Unspecified open wound, left foot, subsequent encounter: Secondary | ICD-10-CM | POA: Diagnosis not present

## 2017-06-11 DIAGNOSIS — S91302A Unspecified open wound, left foot, initial encounter: Secondary | ICD-10-CM | POA: Diagnosis not present

## 2017-06-17 DIAGNOSIS — K219 Gastro-esophageal reflux disease without esophagitis: Secondary | ICD-10-CM | POA: Diagnosis not present

## 2017-06-17 DIAGNOSIS — K5909 Other constipation: Secondary | ICD-10-CM | POA: Diagnosis not present

## 2017-06-17 DIAGNOSIS — G6289 Other specified polyneuropathies: Secondary | ICD-10-CM | POA: Diagnosis not present

## 2017-06-17 DIAGNOSIS — S91302A Unspecified open wound, left foot, initial encounter: Secondary | ICD-10-CM | POA: Diagnosis not present

## 2017-06-19 DIAGNOSIS — J9600 Acute respiratory failure, unspecified whether with hypoxia or hypercapnia: Secondary | ICD-10-CM | POA: Diagnosis not present

## 2017-06-19 DIAGNOSIS — L89893 Pressure ulcer of other site, stage 3: Secondary | ICD-10-CM | POA: Diagnosis not present

## 2017-06-19 DIAGNOSIS — R1319 Other dysphagia: Secondary | ICD-10-CM | POA: Diagnosis not present

## 2017-06-19 DIAGNOSIS — G4709 Other insomnia: Secondary | ICD-10-CM | POA: Diagnosis not present

## 2017-06-19 DIAGNOSIS — L89894 Pressure ulcer of other site, stage 4: Secondary | ICD-10-CM | POA: Diagnosis not present

## 2017-06-19 DIAGNOSIS — M25512 Pain in left shoulder: Secondary | ICD-10-CM | POA: Diagnosis not present

## 2017-06-19 DIAGNOSIS — M6281 Muscle weakness (generalized): Secondary | ICD-10-CM | POA: Diagnosis not present

## 2017-06-19 DIAGNOSIS — R1031 Right lower quadrant pain: Secondary | ICD-10-CM | POA: Diagnosis not present

## 2017-06-19 DIAGNOSIS — M79622 Pain in left upper arm: Secondary | ICD-10-CM | POA: Diagnosis not present

## 2017-06-19 DIAGNOSIS — N939 Abnormal uterine and vaginal bleeding, unspecified: Secondary | ICD-10-CM | POA: Diagnosis not present

## 2017-06-19 DIAGNOSIS — R319 Hematuria, unspecified: Secondary | ICD-10-CM | POA: Diagnosis not present

## 2017-06-19 DIAGNOSIS — N938 Other specified abnormal uterine and vaginal bleeding: Secondary | ICD-10-CM | POA: Diagnosis not present

## 2017-06-19 DIAGNOSIS — K760 Fatty (change of) liver, not elsewhere classified: Secondary | ICD-10-CM | POA: Diagnosis not present

## 2017-06-19 DIAGNOSIS — D510 Vitamin B12 deficiency anemia due to intrinsic factor deficiency: Secondary | ICD-10-CM | POA: Diagnosis not present

## 2017-06-19 DIAGNOSIS — R262 Difficulty in walking, not elsewhere classified: Secondary | ICD-10-CM | POA: Diagnosis not present

## 2017-06-19 DIAGNOSIS — J449 Chronic obstructive pulmonary disease, unspecified: Secondary | ICD-10-CM | POA: Diagnosis not present

## 2017-06-19 DIAGNOSIS — I5189 Other ill-defined heart diseases: Secondary | ICD-10-CM | POA: Diagnosis not present

## 2017-06-19 DIAGNOSIS — S91302A Unspecified open wound, left foot, initial encounter: Secondary | ICD-10-CM | POA: Diagnosis not present

## 2017-06-19 DIAGNOSIS — S42352K Displaced comminuted fracture of shaft of humerus, left arm, subsequent encounter for fracture with nonunion: Secondary | ICD-10-CM | POA: Diagnosis not present

## 2017-06-19 DIAGNOSIS — L988 Other specified disorders of the skin and subcutaneous tissue: Secondary | ICD-10-CM | POA: Diagnosis not present

## 2017-06-19 DIAGNOSIS — K1379 Other lesions of oral mucosa: Secondary | ICD-10-CM | POA: Diagnosis not present

## 2017-06-19 DIAGNOSIS — N390 Urinary tract infection, site not specified: Secondary | ICD-10-CM | POA: Diagnosis not present

## 2017-06-19 DIAGNOSIS — E559 Vitamin D deficiency, unspecified: Secondary | ICD-10-CM | POA: Diagnosis not present

## 2017-06-19 DIAGNOSIS — S42302A Unspecified fracture of shaft of humerus, left arm, initial encounter for closed fracture: Secondary | ICD-10-CM | POA: Diagnosis not present

## 2017-06-19 DIAGNOSIS — S91302D Unspecified open wound, left foot, subsequent encounter: Secondary | ICD-10-CM | POA: Diagnosis not present

## 2017-06-19 DIAGNOSIS — D464 Refractory anemia, unspecified: Secondary | ICD-10-CM | POA: Diagnosis not present

## 2017-06-19 DIAGNOSIS — N184 Chronic kidney disease, stage 4 (severe): Secondary | ICD-10-CM | POA: Diagnosis not present

## 2017-06-19 DIAGNOSIS — R310 Gross hematuria: Secondary | ICD-10-CM | POA: Diagnosis not present

## 2017-06-19 DIAGNOSIS — L89892 Pressure ulcer of other site, stage 2: Secondary | ICD-10-CM | POA: Diagnosis not present

## 2017-06-19 DIAGNOSIS — B3789 Other sites of candidiasis: Secondary | ICD-10-CM | POA: Diagnosis not present

## 2017-06-19 DIAGNOSIS — S42302D Unspecified fracture of shaft of humerus, left arm, subsequent encounter for fracture with routine healing: Secondary | ICD-10-CM | POA: Diagnosis not present

## 2017-06-19 DIAGNOSIS — J189 Pneumonia, unspecified organism: Secondary | ICD-10-CM | POA: Diagnosis not present

## 2017-07-01 DIAGNOSIS — N184 Chronic kidney disease, stage 4 (severe): Secondary | ICD-10-CM | POA: Diagnosis not present

## 2017-07-01 DIAGNOSIS — G4709 Other insomnia: Secondary | ICD-10-CM | POA: Diagnosis not present

## 2017-07-01 DIAGNOSIS — M79622 Pain in left upper arm: Secondary | ICD-10-CM | POA: Diagnosis not present

## 2017-07-01 DIAGNOSIS — S91302D Unspecified open wound, left foot, subsequent encounter: Secondary | ICD-10-CM | POA: Diagnosis not present

## 2017-07-02 DIAGNOSIS — S91302A Unspecified open wound, left foot, initial encounter: Secondary | ICD-10-CM | POA: Diagnosis not present

## 2017-07-02 DIAGNOSIS — S91302D Unspecified open wound, left foot, subsequent encounter: Secondary | ICD-10-CM | POA: Diagnosis not present

## 2017-07-06 DIAGNOSIS — N184 Chronic kidney disease, stage 4 (severe): Secondary | ICD-10-CM | POA: Diagnosis not present

## 2017-07-06 DIAGNOSIS — I5189 Other ill-defined heart diseases: Secondary | ICD-10-CM | POA: Diagnosis not present

## 2017-07-23 DIAGNOSIS — S91302A Unspecified open wound, left foot, initial encounter: Secondary | ICD-10-CM | POA: Diagnosis not present

## 2017-07-23 DIAGNOSIS — S91302D Unspecified open wound, left foot, subsequent encounter: Secondary | ICD-10-CM | POA: Diagnosis not present

## 2017-07-30 DIAGNOSIS — S91302A Unspecified open wound, left foot, initial encounter: Secondary | ICD-10-CM | POA: Diagnosis not present

## 2017-07-30 DIAGNOSIS — S91302D Unspecified open wound, left foot, subsequent encounter: Secondary | ICD-10-CM | POA: Diagnosis not present

## 2017-08-12 DIAGNOSIS — R1319 Other dysphagia: Secondary | ICD-10-CM | POA: Diagnosis not present

## 2017-08-12 DIAGNOSIS — M6281 Muscle weakness (generalized): Secondary | ICD-10-CM | POA: Diagnosis not present

## 2017-08-12 DIAGNOSIS — D510 Vitamin B12 deficiency anemia due to intrinsic factor deficiency: Secondary | ICD-10-CM | POA: Diagnosis not present

## 2017-08-12 DIAGNOSIS — I5189 Other ill-defined heart diseases: Secondary | ICD-10-CM | POA: Diagnosis not present

## 2017-08-13 DIAGNOSIS — L89893 Pressure ulcer of other site, stage 3: Secondary | ICD-10-CM | POA: Diagnosis not present

## 2017-08-13 DIAGNOSIS — L89892 Pressure ulcer of other site, stage 2: Secondary | ICD-10-CM | POA: Diagnosis not present

## 2017-08-13 DIAGNOSIS — S91302D Unspecified open wound, left foot, subsequent encounter: Secondary | ICD-10-CM | POA: Diagnosis not present

## 2017-08-17 DIAGNOSIS — K1379 Other lesions of oral mucosa: Secondary | ICD-10-CM | POA: Diagnosis not present

## 2017-08-17 DIAGNOSIS — R310 Gross hematuria: Secondary | ICD-10-CM | POA: Diagnosis not present

## 2017-08-17 DIAGNOSIS — B3789 Other sites of candidiasis: Secondary | ICD-10-CM | POA: Diagnosis not present

## 2017-08-19 DIAGNOSIS — R1031 Right lower quadrant pain: Secondary | ICD-10-CM | POA: Diagnosis not present

## 2017-08-19 DIAGNOSIS — S42302A Unspecified fracture of shaft of humerus, left arm, initial encounter for closed fracture: Secondary | ICD-10-CM | POA: Diagnosis not present

## 2017-08-19 DIAGNOSIS — N938 Other specified abnormal uterine and vaginal bleeding: Secondary | ICD-10-CM | POA: Diagnosis not present

## 2017-08-27 DIAGNOSIS — L89892 Pressure ulcer of other site, stage 2: Secondary | ICD-10-CM | POA: Diagnosis not present

## 2017-08-27 DIAGNOSIS — S91302D Unspecified open wound, left foot, subsequent encounter: Secondary | ICD-10-CM | POA: Diagnosis not present

## 2017-08-27 DIAGNOSIS — L89894 Pressure ulcer of other site, stage 4: Secondary | ICD-10-CM | POA: Diagnosis not present

## 2017-09-03 DIAGNOSIS — M25512 Pain in left shoulder: Secondary | ICD-10-CM | POA: Diagnosis not present

## 2017-09-03 DIAGNOSIS — S42352K Displaced comminuted fracture of shaft of humerus, left arm, subsequent encounter for fracture with nonunion: Secondary | ICD-10-CM | POA: Diagnosis not present

## 2017-09-03 DIAGNOSIS — S91302D Unspecified open wound, left foot, subsequent encounter: Secondary | ICD-10-CM | POA: Diagnosis not present

## 2017-09-03 DIAGNOSIS — L89894 Pressure ulcer of other site, stage 4: Secondary | ICD-10-CM | POA: Diagnosis not present

## 2017-09-03 DIAGNOSIS — L89892 Pressure ulcer of other site, stage 2: Secondary | ICD-10-CM | POA: Diagnosis not present

## 2017-09-10 DIAGNOSIS — L89894 Pressure ulcer of other site, stage 4: Secondary | ICD-10-CM | POA: Diagnosis not present

## 2017-09-10 DIAGNOSIS — S91302D Unspecified open wound, left foot, subsequent encounter: Secondary | ICD-10-CM | POA: Diagnosis not present

## 2017-09-10 DIAGNOSIS — L89892 Pressure ulcer of other site, stage 2: Secondary | ICD-10-CM | POA: Diagnosis not present

## 2017-09-14 DIAGNOSIS — L988 Other specified disorders of the skin and subcutaneous tissue: Secondary | ICD-10-CM | POA: Diagnosis not present

## 2017-09-17 DIAGNOSIS — L89892 Pressure ulcer of other site, stage 2: Secondary | ICD-10-CM | POA: Diagnosis not present

## 2017-09-17 DIAGNOSIS — S91302D Unspecified open wound, left foot, subsequent encounter: Secondary | ICD-10-CM | POA: Diagnosis not present

## 2017-09-17 DIAGNOSIS — L89894 Pressure ulcer of other site, stage 4: Secondary | ICD-10-CM | POA: Diagnosis not present

## 2017-09-24 DIAGNOSIS — L89894 Pressure ulcer of other site, stage 4: Secondary | ICD-10-CM | POA: Diagnosis not present

## 2017-09-24 DIAGNOSIS — S91302D Unspecified open wound, left foot, subsequent encounter: Secondary | ICD-10-CM | POA: Diagnosis not present

## 2017-09-24 DIAGNOSIS — L89892 Pressure ulcer of other site, stage 2: Secondary | ICD-10-CM | POA: Diagnosis not present

## 2017-10-01 DIAGNOSIS — S91302D Unspecified open wound, left foot, subsequent encounter: Secondary | ICD-10-CM | POA: Diagnosis not present

## 2017-10-01 DIAGNOSIS — L89894 Pressure ulcer of other site, stage 4: Secondary | ICD-10-CM | POA: Diagnosis not present

## 2017-10-01 DIAGNOSIS — L89892 Pressure ulcer of other site, stage 2: Secondary | ICD-10-CM | POA: Diagnosis not present

## 2017-10-08 DIAGNOSIS — L89894 Pressure ulcer of other site, stage 4: Secondary | ICD-10-CM | POA: Diagnosis not present

## 2017-10-08 DIAGNOSIS — S91302D Unspecified open wound, left foot, subsequent encounter: Secondary | ICD-10-CM | POA: Diagnosis not present

## 2017-10-08 DIAGNOSIS — L89892 Pressure ulcer of other site, stage 2: Secondary | ICD-10-CM | POA: Diagnosis not present

## 2017-10-15 DIAGNOSIS — L89892 Pressure ulcer of other site, stage 2: Secondary | ICD-10-CM | POA: Diagnosis not present

## 2017-10-15 DIAGNOSIS — S91302D Unspecified open wound, left foot, subsequent encounter: Secondary | ICD-10-CM | POA: Diagnosis not present

## 2017-10-15 DIAGNOSIS — L89894 Pressure ulcer of other site, stage 4: Secondary | ICD-10-CM | POA: Diagnosis not present

## 2017-11-11 ENCOUNTER — Ambulatory Visit: Payer: Medicare Other | Admitting: Family

## 2017-11-29 IMAGING — CR DG HUMERUS 2V *L*
1 series · 4 of 4 positions shown · non-contrast
Comparison: None.

CLINICAL DATA: Fall, head and facial trauma.

EXAM:
LEFT HUMERUS - 2+ VIEW

[Series 1: t humerus ap left · 0.14mm/px · 4 of 4 slices shown]
[im 1/4]
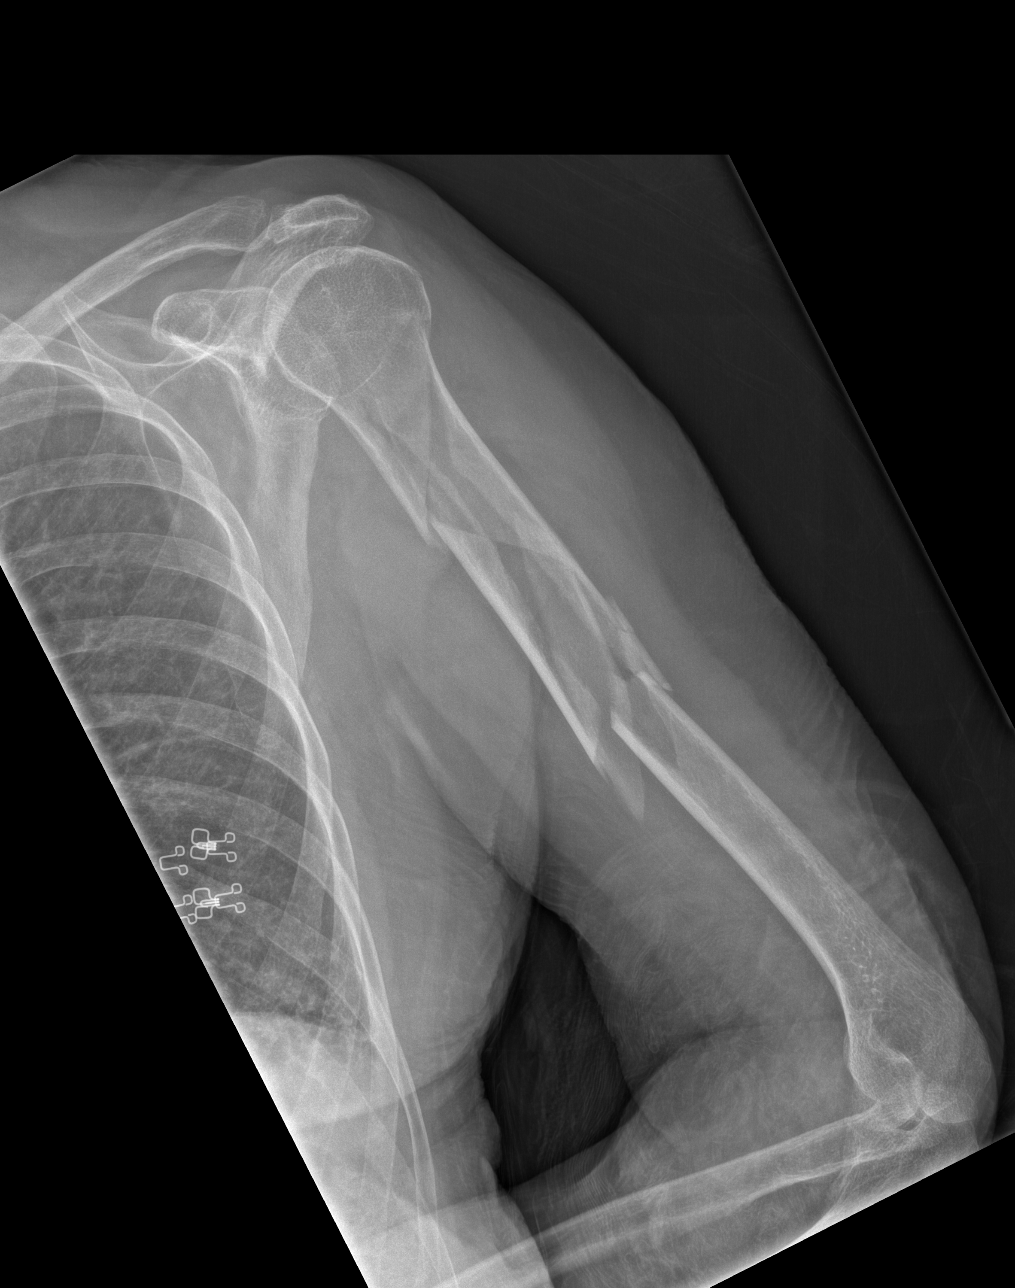
[im 2/4]
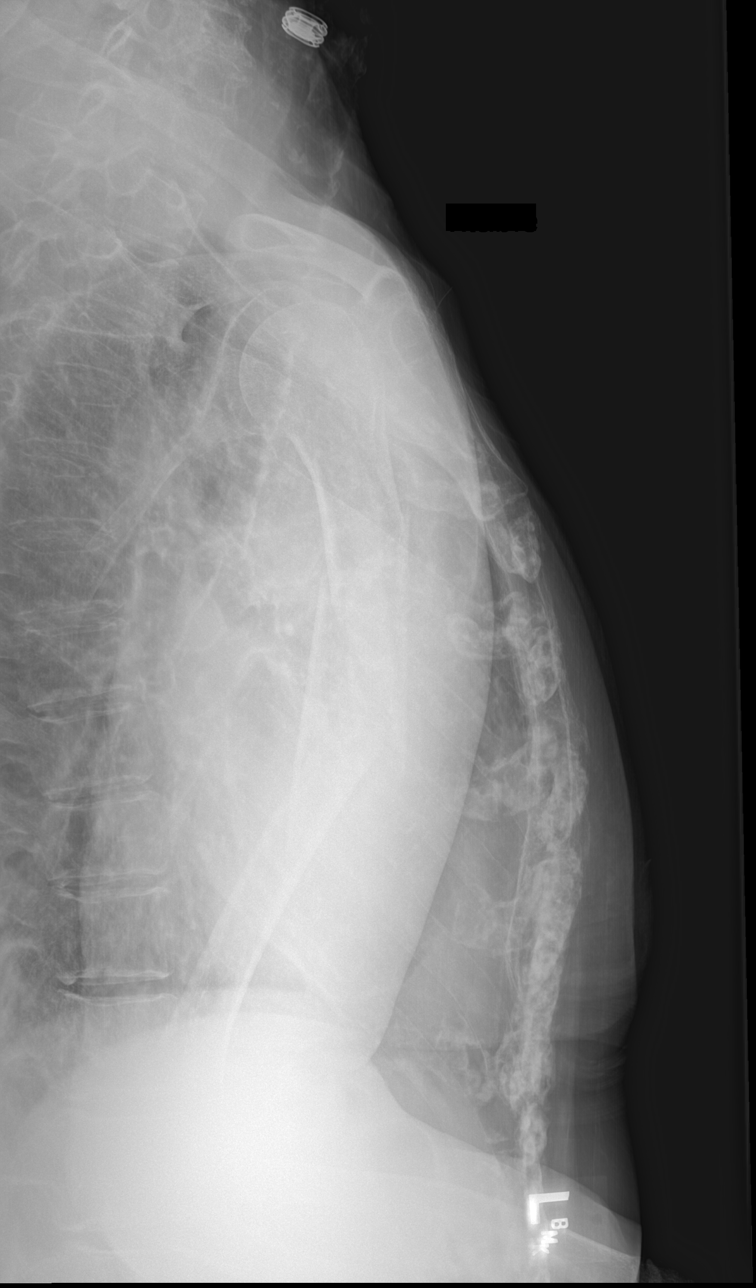
[im 3/4]
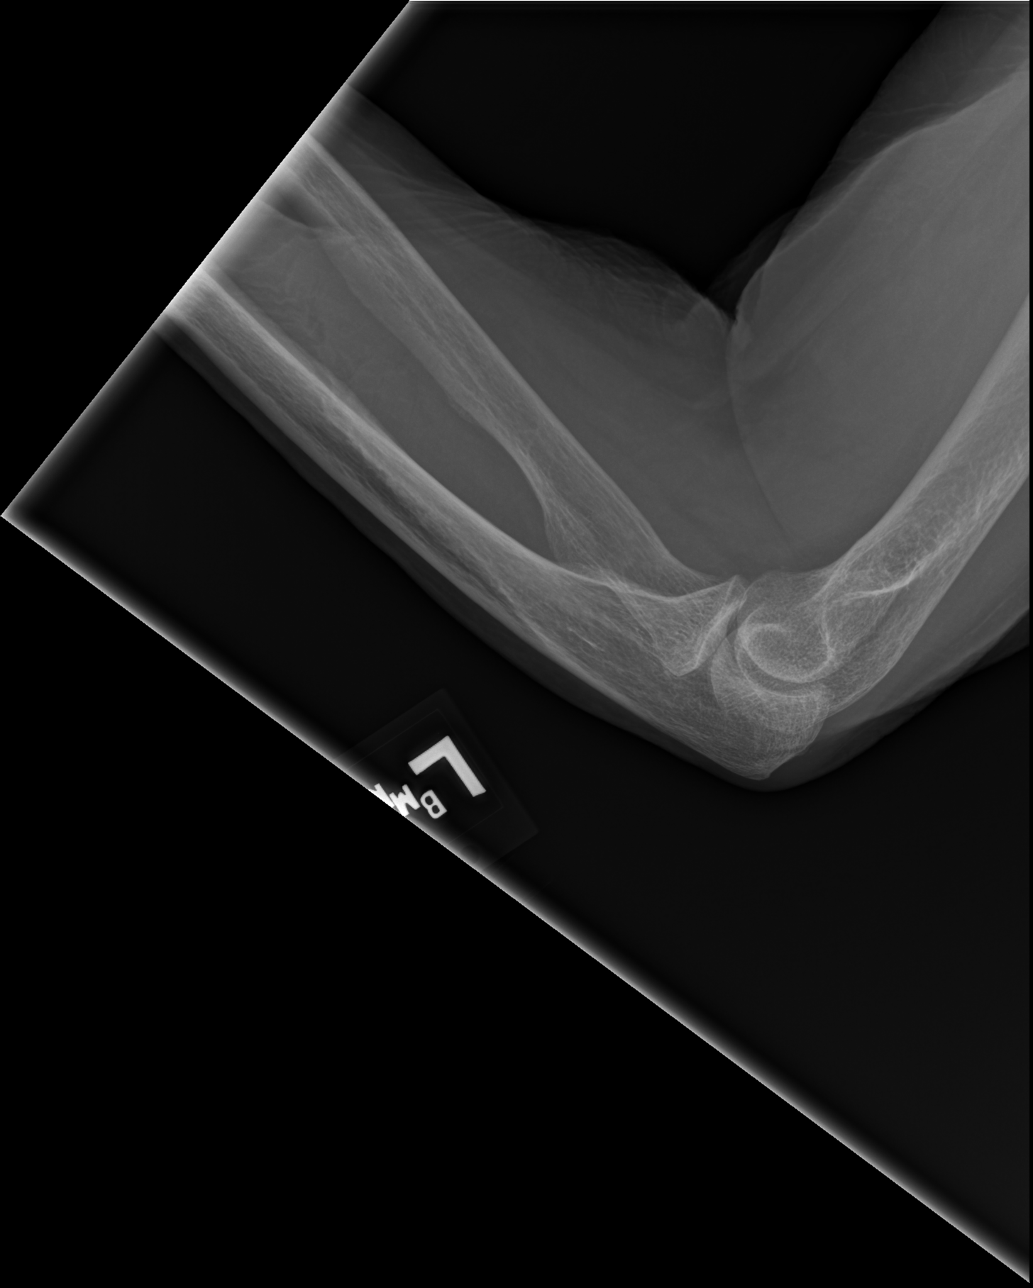
[im 4/4]
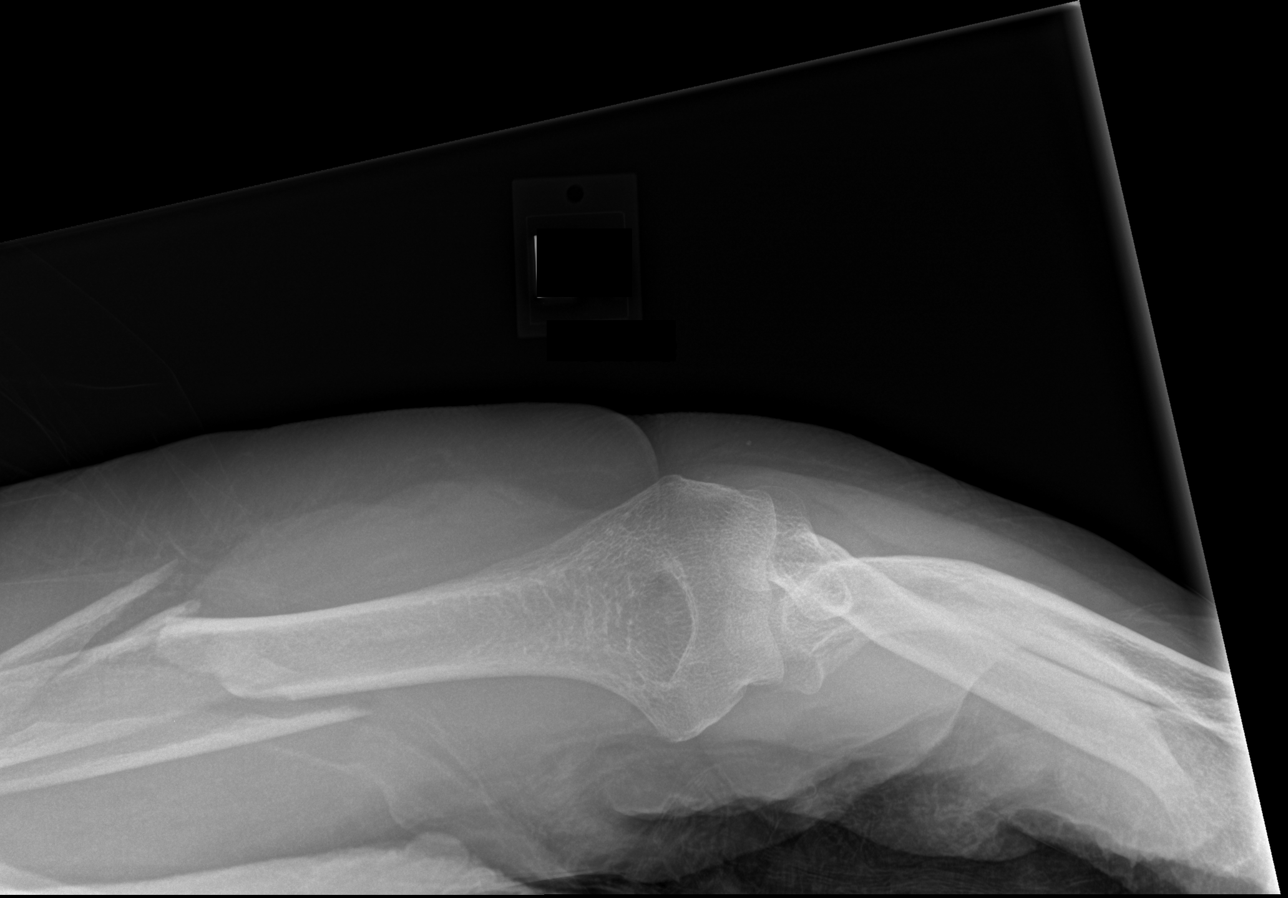

[4 of 4 positions shown; findings below may reference images not displayed]

FINDINGS: Comminuted proximal image fracture with anterolateral angulation of
the distal bony fragments. No intra-articular extension. No
dislocation. Osteopenia. Soft tissue planes are nonsuspicious.
IMPRESSION: Acute displaced LEFT humerus fracture.  No dislocation.

## 2017-11-29 IMAGING — CT CT MAXILLOFACIAL W/O CM
4 of 10 series · 15 of 47 positions shown, 17 images · non-contrast
Comparison: Prior study from 08/04/2012.

CLINICAL DATA: Initial evaluation for acute trauma, fall.

EXAM:
CT HEAD WITHOUT CONTRAST
CT MAXILLOFACIAL WITHOUT CONTRAST
CT CERVICAL SPINE WITHOUT CONTRAST
TECHNIQUE: Multidetector CT imaging of the head, cervical spine, and
maxillofacial structures were performed using the standard protocol
without intravenous contrast. Multiplanar CT image reconstructions
of the cervical spine and maxillofacial structures were also
generated.

[Series 7: c spine soft · axial · 0.33mm/px · z∈[+367,+407]mm · 3 of 83 slices shown]
[im 11/83  brain]
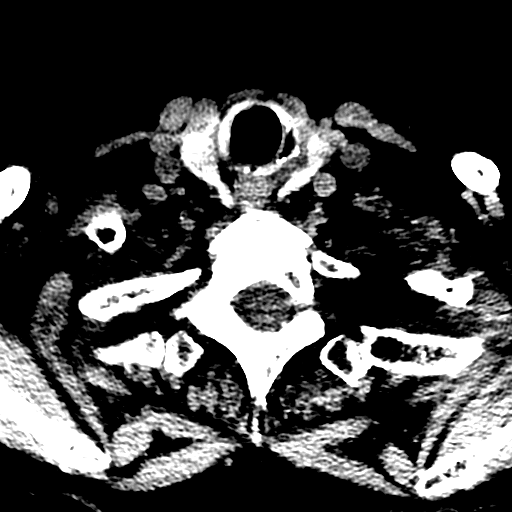
[im 21/83  brain]
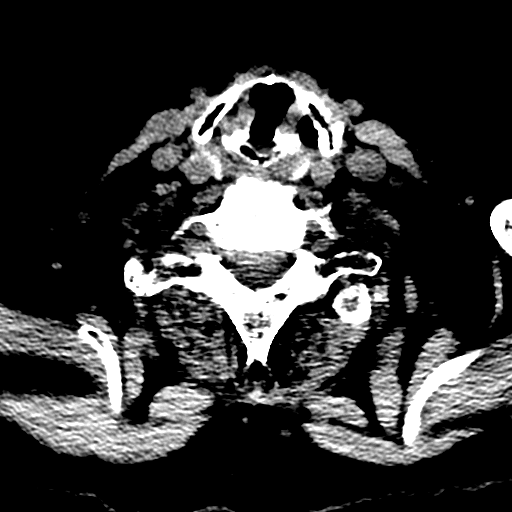
[im 31/83  brain]
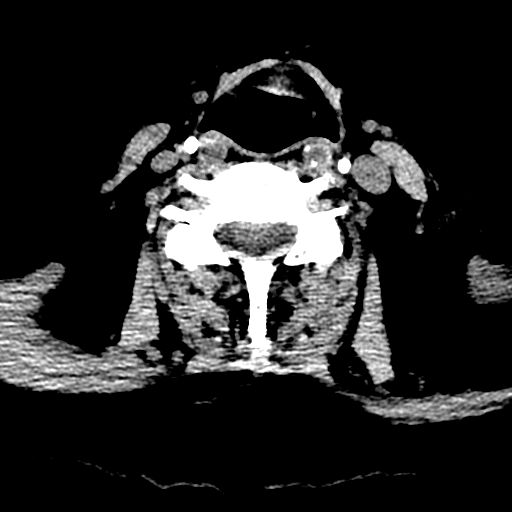

[Series 9: orthogonal axials · axial · 0.26mm/px · z∈[+339,+479]mm · 8 of 95 slices shown, 10 images]
[im 11/95  brain]
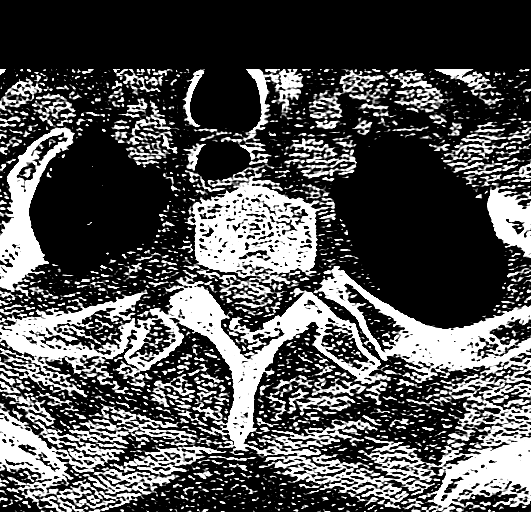
[im 11/95  bone]
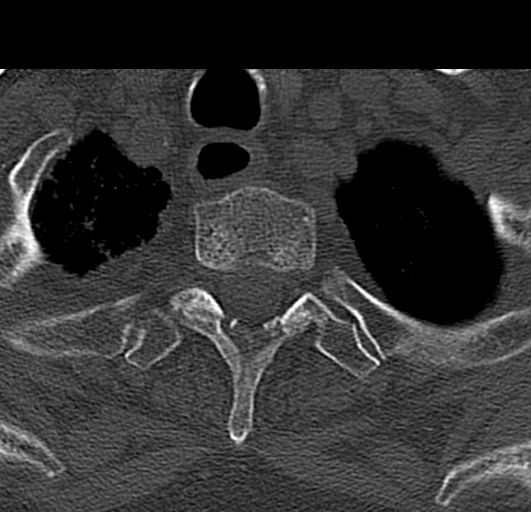
[im 21/95  bone]
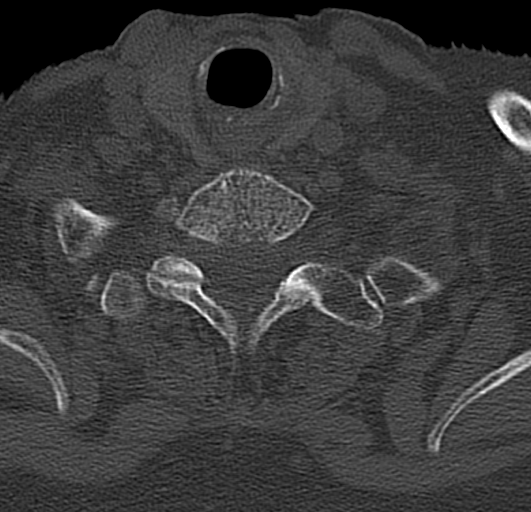
[im 32/95  bone]
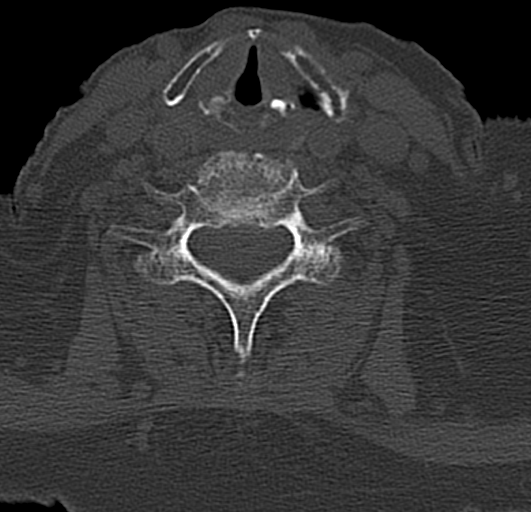
[im 42/95  bone]
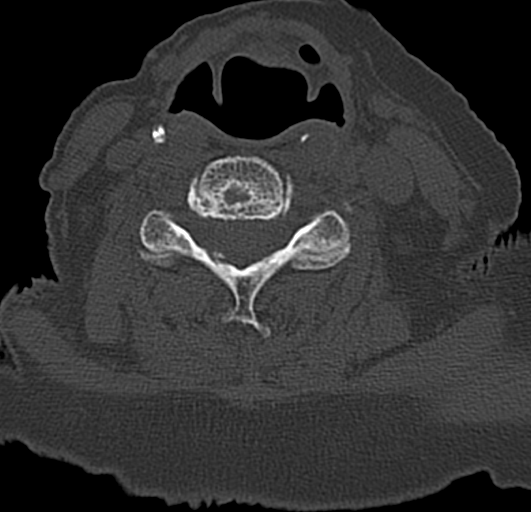
[im 53/95  brain]
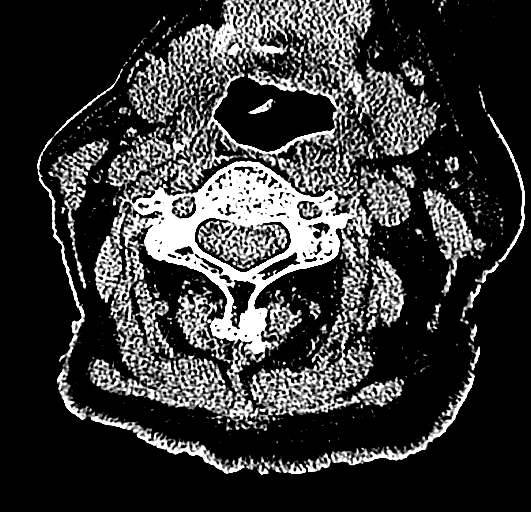
[im 53/95  bone]
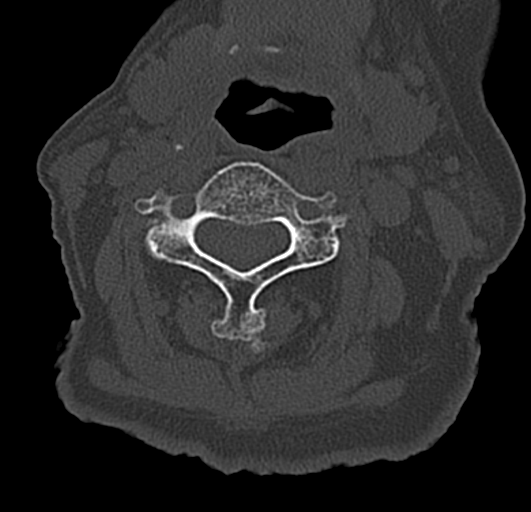
[im 63/95  bone]
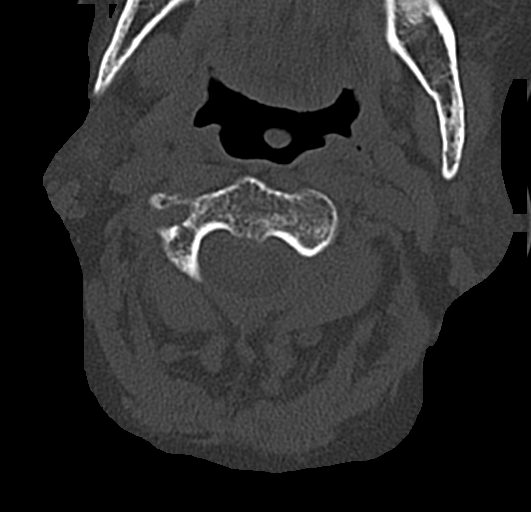
[im 74/95  bone]
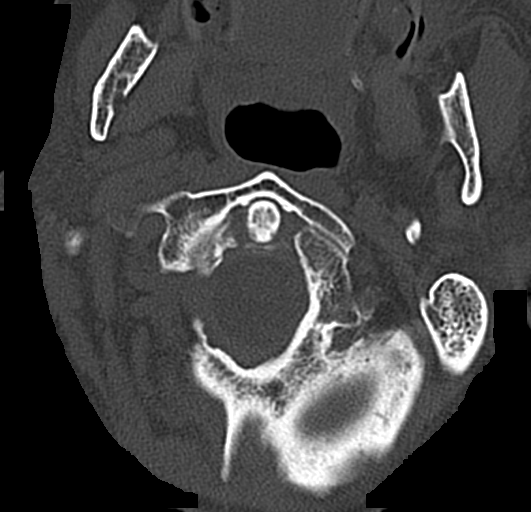
[im 84/95  bone]
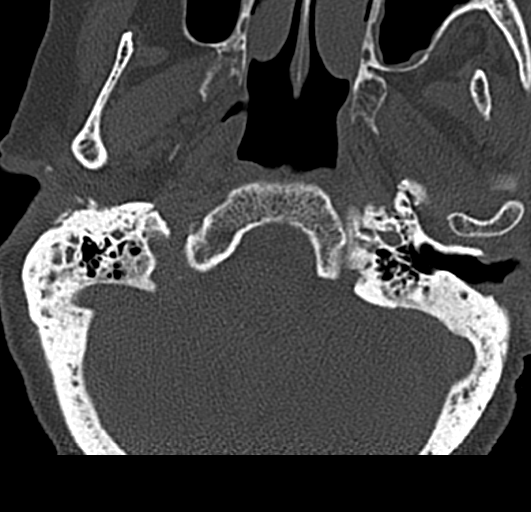

[Series 16: coronal soft · coronal · 0.29mm/px · 3 of 79 slices shown]
[im 20/79  bone]
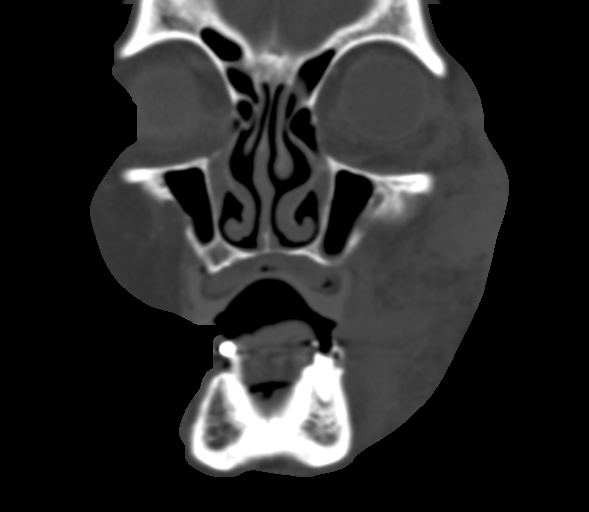
[im 40/79  bone]
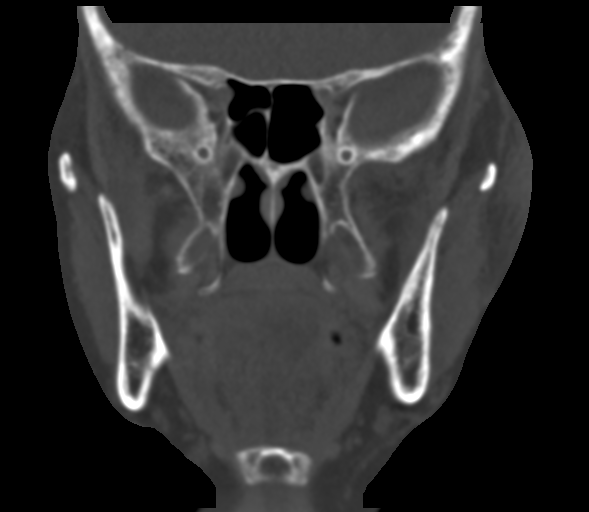
[im 59/79  bone]
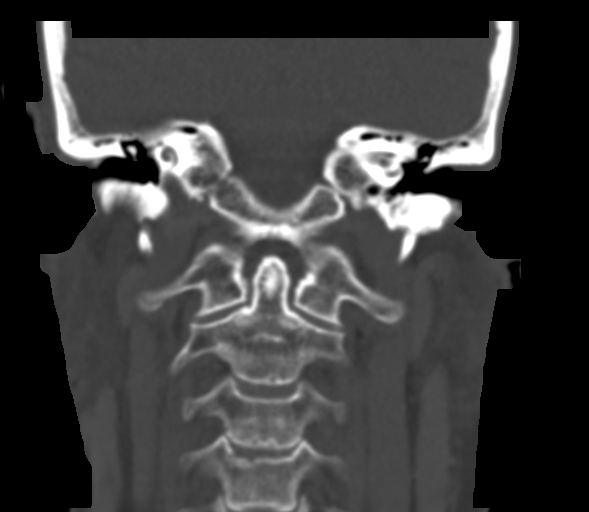

[Series 18: sagittal soft · sagittal · 0.30mm/px · 1 of 74 slices shown]
[im 37/74  bone]
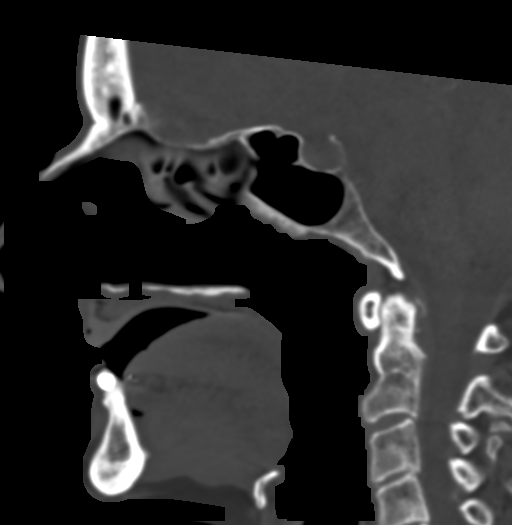

[15 of 47 positions shown; findings below may reference images not displayed]

FINDINGS: CT HEAD FINDINGS

Brain: Generalized cerebral atrophy with chronic microvascular
ischemic disease. No acute intracranial hemorrhage. No evidence for
acute large vessel territory infarct. No mass lesion, midline shift
or mass effect. No hydrocephalus. No extra-axial fluid collection.

Vascular: No hyperdense vessel. Scattered vascular calcifications
noted within the carotid siphons.

Skull: Soft tissue contusion at the left temporal and periorbital
region. Additional soft tissue contusion near the left vertex. Scalp
soft tissues otherwise within normal limits. Visualized calvarium
intact.

CT MAXILLOFACIAL FINDINGS

Osseous: Zygomatic arches intact. No acute maxillary fracture.
Linear lucency extending through the left pterygoid plate favored to
be chronic in nature. No acute fracture about the pterygoid plates.
Nasal bones intact. Nasal septum bowed to the right but intact.
Mandible intact. Mandibular condyles normally situated. No acute
abnormality about the dentition.

Orbits: Globes intact. No retro-orbital hematoma or other pathology.
Bony orbits intact without evidence orbital floor fracture.

Sinuses: Mild scattered mucosal thickening within the left maxillary
sinus. Paranasal sinuses are otherwise clear. Mild scattered opacity
within the mastoid air cells bilaterally, likely chronic.

Soft tissues: Extensive soft tissue contusion within the left
periorbital region, extending into the left face.

CT CERVICAL SPINE FINDINGS

Alignment: Vertebral bodies are normally aligned with preservation
of the normal cervical lordosis. No listhesis.

Skull base and vertebrae: Skullbase intact. Normal C1-2
articulations are preserved. Dens is intact. Mild chronic height
loss at the superior endplate of T1. Vertebral body heights
otherwise maintained. No acute fracture.

Soft tissues and spinal canal: The soft tissues of the neck
demonstrate no acute abnormality. No prevertebral soft tissue
swelling. Scattered vascular calcifications noted about the carotid
bifurcations.

Disc levels: Scattered relatively mild multilevel degenerative
spondylolysis, greatest at C5-6 and C6-7.

Upper chest: Visualized upper chest without acute abnormality.
Irregular pleural thickening noted at the right lung apex.
IMPRESSION: CT HEAD:

1. No acute intracranial process identified.
2. Soft tissue contusion at the left temporal scalp and left vertex.
3. Mild age-related cerebral atrophy with chronic microvascular
ischemic disease.

CT MAXILLOFACIAL:

1. Prominent left periorbital and facial use soft tissue contusion.
2. No acute maxillofacial fracture identified. Intact globes with no
retro-orbital pathology.

CT CERVICAL SPINE:

No acute traumatic injury within the cervical spine.

## 2018-01-16 ENCOUNTER — Other Ambulatory Visit
Admission: RE | Admit: 2018-01-16 | Discharge: 2018-01-16 | Disposition: A | Discharge status: Home / Self Care | Payer: Medicare Other | Source: Other Acute Inpatient Hospital | Attending: Family Medicine | Admitting: Family Medicine

## 2018-01-16 DIAGNOSIS — N184 Chronic kidney disease, stage 4 (severe): Secondary | ICD-10-CM | POA: Insufficient documentation

## 2018-01-16 DIAGNOSIS — D649 Anemia, unspecified: Secondary | ICD-10-CM | POA: Diagnosis present

## 2018-01-16 DIAGNOSIS — E785 Hyperlipidemia, unspecified: Secondary | ICD-10-CM | POA: Diagnosis present

## 2018-01-16 LAB — COMPREHENSIVE METABOLIC PANEL
ALBUMIN: 2.4 g/dL — AB (ref 3.5–5.0)
ALK PHOS: 118 U/L (ref 38–126)
ALT: 11 U/L — AB (ref 14–54)
AST: 18 U/L (ref 15–41)
Anion gap: 8 (ref 5–15)
BUN: 42 mg/dL — AB (ref 6–20)
CALCIUM: 11 mg/dL — AB (ref 8.9–10.3)
CO2: 27 mmol/L (ref 22–32)
CREATININE: 2.7 mg/dL — AB (ref 0.44–1.00)
Chloride: 98 mmol/L — ABNORMAL LOW (ref 101–111)
GFR calc non Af Amer: 14 mL/min — ABNORMAL LOW (ref >60)
GFR, EST AFRICAN AMERICAN: 16 mL/min — AB (ref >60)
GLUCOSE: 114 mg/dL — AB (ref 65–99)
Potassium: 4.7 mmol/L (ref 3.5–5.1)
SODIUM: 133 mmol/L — AB (ref 135–145)
Total Bilirubin: 0.7 mg/dL (ref 0.3–1.2)
Total Protein: 6.2 g/dL — ABNORMAL LOW (ref 6.5–8.1)

## 2018-01-16 LAB — CBC WITH DIFFERENTIAL/PLATELET
BASOS ABS: 0 10*3/uL (ref 0–0.1)
BASOS PCT: 0 %
EOS ABS: 0 10*3/uL (ref 0–0.7)
EOS PCT: 1 %
HCT: 31.7 % — ABNORMAL LOW (ref 35.0–47.0)
HEMOGLOBIN: 10.6 g/dL — AB (ref 12.0–16.0)
LYMPHS ABS: 0.2 10*3/uL — AB (ref 1.0–3.6)
Lymphocytes Relative: 4 %
MCH: 32.1 pg (ref 26.0–34.0)
MCHC: 33.3 g/dL (ref 32.0–36.0)
MCV: 96.4 fL (ref 80.0–100.0)
Monocytes Absolute: 0 10*3/uL — ABNORMAL LOW (ref 0.2–0.9)
Monocytes Relative: 1 %
NEUTROS PCT: 94 %
Neutro Abs: 4.7 10*3/uL (ref 1.4–6.5)
PLATELETS: 166 10*3/uL (ref 150–440)
RBC: 3.29 MIL/uL — AB (ref 3.80–5.20)
RDW: 13.3 % (ref 11.5–14.5)
WBC: 5 10*3/uL (ref 3.6–11.0)

## 2018-03-19 DEATH — deceased
